# Patient Record
Sex: Female | Born: 1935
Health system: Southern US, Community
[De-identification: ages and names within clinical notes are randomized; demographics above are authoritative.]

## PROBLEM LIST (undated history)

## (undated) DIAGNOSIS — J439 Emphysema, unspecified: Secondary | ICD-10-CM

## (undated) DIAGNOSIS — M810 Age-related osteoporosis without current pathological fracture: Secondary | ICD-10-CM

## (undated) DIAGNOSIS — M109 Gout, unspecified: Secondary | ICD-10-CM

## (undated) DIAGNOSIS — E785 Hyperlipidemia, unspecified: Secondary | ICD-10-CM

## (undated) DIAGNOSIS — D510 Vitamin B12 deficiency anemia due to intrinsic factor deficiency: Secondary | ICD-10-CM

## (undated) DIAGNOSIS — F1010 Alcohol abuse, uncomplicated: Secondary | ICD-10-CM

## (undated) DIAGNOSIS — I10 Essential (primary) hypertension: Secondary | ICD-10-CM

## (undated) DIAGNOSIS — E559 Vitamin D deficiency, unspecified: Secondary | ICD-10-CM

## (undated) DIAGNOSIS — S92351A Displaced fracture of fifth metatarsal bone, right foot, initial encounter for closed fracture: Secondary | ICD-10-CM

## (undated) DIAGNOSIS — M353 Polymyalgia rheumatica: Secondary | ICD-10-CM

## (undated) HISTORY — DX: Vitamin B12 deficiency anemia due to intrinsic factor deficiency: D51.0

## (undated) HISTORY — DX: Alcohol abuse, uncomplicated: F10.10

## (undated) HISTORY — DX: Displaced fracture of fifth metatarsal bone, right foot, initial encounter for closed fracture: S92.351A

## (undated) HISTORY — DX: Polymyalgia rheumatica: M35.3

## (undated) HISTORY — PX: PARTIAL HIP ARTHROPLASTY: SHX733

## (undated) HISTORY — DX: Gout, unspecified: M10.9

## (undated) HISTORY — DX: Emphysema, unspecified: J43.9

## (undated) HISTORY — DX: Essential (primary) hypertension: I10

## (undated) HISTORY — DX: Vitamin D deficiency, unspecified: E55.9

## (undated) HISTORY — DX: Hyperlipidemia, unspecified: E78.5

## (undated) HISTORY — DX: Age-related osteoporosis without current pathological fracture: M81.0

---

## 1997-04-22 ENCOUNTER — Other Ambulatory Visit: Admission: RE | Admit: 1997-04-22 | Discharge: 1997-04-22 | Payer: Self-pay | Admitting: Rheumatology

## 1999-02-08 ENCOUNTER — Other Ambulatory Visit: Admission: RE | Admit: 1999-02-08 | Discharge: 1999-02-08 | Payer: Self-pay | Admitting: Family Medicine

## 2001-05-21 ENCOUNTER — Ambulatory Visit (HOSPITAL_COMMUNITY): Admission: RE | Admit: 2001-05-21 | Discharge: 2001-05-21 | Payer: Self-pay | Admitting: Rheumatology

## 2003-11-24 ENCOUNTER — Ambulatory Visit: Payer: Self-pay | Admitting: Family Medicine

## 2003-12-06 ENCOUNTER — Ambulatory Visit: Payer: Self-pay | Admitting: Internal Medicine

## 2003-12-24 ENCOUNTER — Ambulatory Visit: Payer: Self-pay | Admitting: Family Medicine

## 2004-01-25 ENCOUNTER — Ambulatory Visit: Payer: Self-pay | Admitting: Internal Medicine

## 2004-01-27 ENCOUNTER — Ambulatory Visit: Payer: Self-pay | Admitting: Family Medicine

## 2004-02-29 ENCOUNTER — Ambulatory Visit: Payer: Self-pay | Admitting: Family Medicine

## 2004-03-28 ENCOUNTER — Ambulatory Visit: Payer: Self-pay | Admitting: Family Medicine

## 2004-05-01 ENCOUNTER — Ambulatory Visit: Payer: Self-pay | Admitting: Internal Medicine

## 2004-05-31 ENCOUNTER — Ambulatory Visit: Payer: Self-pay | Admitting: Family Medicine

## 2004-06-28 ENCOUNTER — Ambulatory Visit: Payer: Self-pay | Admitting: Family Medicine

## 2004-07-28 ENCOUNTER — Ambulatory Visit: Payer: Self-pay | Admitting: Family Medicine

## 2004-08-28 ENCOUNTER — Ambulatory Visit: Payer: Self-pay | Admitting: Family Medicine

## 2004-09-26 ENCOUNTER — Ambulatory Visit: Payer: Self-pay | Admitting: Family Medicine

## 2004-10-24 ENCOUNTER — Ambulatory Visit: Payer: Self-pay | Admitting: Family Medicine

## 2004-11-22 ENCOUNTER — Ambulatory Visit: Payer: Self-pay | Admitting: Family Medicine

## 2004-12-26 ENCOUNTER — Ambulatory Visit: Payer: Self-pay | Admitting: Family Medicine

## 2005-01-25 ENCOUNTER — Ambulatory Visit: Payer: Self-pay | Admitting: Family Medicine

## 2005-02-26 ENCOUNTER — Ambulatory Visit: Payer: Self-pay | Admitting: Family Medicine

## 2005-03-27 ENCOUNTER — Ambulatory Visit: Payer: Self-pay | Admitting: Family Medicine

## 2005-05-01 ENCOUNTER — Ambulatory Visit: Payer: Self-pay | Admitting: Family Medicine

## 2005-05-31 ENCOUNTER — Ambulatory Visit: Payer: Self-pay | Admitting: Family Medicine

## 2005-07-03 ENCOUNTER — Ambulatory Visit: Payer: Self-pay | Admitting: Family Medicine

## 2005-07-22 ENCOUNTER — Inpatient Hospital Stay (HOSPITAL_COMMUNITY): Admission: EM | Admit: 2005-07-22 | Discharge: 2005-08-01 | Payer: Self-pay | Admitting: Emergency Medicine

## 2005-07-25 ENCOUNTER — Ambulatory Visit: Payer: Self-pay | Admitting: Physical Medicine & Rehabilitation

## 2005-08-30 ENCOUNTER — Ambulatory Visit: Payer: Self-pay | Admitting: Family Medicine

## 2005-10-01 ENCOUNTER — Encounter: Admission: RE | Admit: 2005-10-01 | Discharge: 2005-10-01 | Payer: Self-pay | Admitting: Orthopedic Surgery

## 2005-10-02 ENCOUNTER — Ambulatory Visit: Payer: Self-pay | Admitting: Family Medicine

## 2005-11-01 ENCOUNTER — Ambulatory Visit: Payer: Self-pay | Admitting: Family Medicine

## 2005-11-29 ENCOUNTER — Ambulatory Visit: Payer: Self-pay | Admitting: Family Medicine

## 2005-12-27 ENCOUNTER — Ambulatory Visit: Payer: Self-pay | Admitting: Family Medicine

## 2006-01-29 ENCOUNTER — Ambulatory Visit: Payer: Self-pay | Admitting: Family Medicine

## 2006-02-28 ENCOUNTER — Ambulatory Visit: Payer: Self-pay | Admitting: Family Medicine

## 2006-03-28 ENCOUNTER — Ambulatory Visit: Payer: Self-pay | Admitting: Family Medicine

## 2006-04-29 ENCOUNTER — Ambulatory Visit: Payer: Self-pay | Admitting: Family Medicine

## 2006-05-29 ENCOUNTER — Ambulatory Visit: Payer: Self-pay | Admitting: Family Medicine

## 2006-06-25 ENCOUNTER — Ambulatory Visit: Payer: Self-pay | Admitting: Family Medicine

## 2006-07-12 ENCOUNTER — Encounter: Payer: Self-pay | Admitting: Family Medicine

## 2006-07-12 DIAGNOSIS — M810 Age-related osteoporosis without current pathological fracture: Secondary | ICD-10-CM

## 2006-07-12 DIAGNOSIS — E559 Vitamin D deficiency, unspecified: Secondary | ICD-10-CM

## 2006-07-12 DIAGNOSIS — I10 Essential (primary) hypertension: Secondary | ICD-10-CM | POA: Insufficient documentation

## 2006-07-12 HISTORY — PX: APPENDECTOMY: SHX54

## 2006-07-12 HISTORY — DX: Vitamin D deficiency, unspecified: E55.9

## 2006-07-12 HISTORY — DX: Age-related osteoporosis without current pathological fracture: M81.0

## 2006-07-25 ENCOUNTER — Ambulatory Visit: Payer: Self-pay | Admitting: Family Medicine

## 2006-08-27 ENCOUNTER — Ambulatory Visit: Payer: Self-pay | Admitting: Family Medicine

## 2006-09-26 ENCOUNTER — Ambulatory Visit: Payer: Self-pay | Admitting: Family Medicine

## 2006-10-25 ENCOUNTER — Ambulatory Visit: Payer: Self-pay | Admitting: Family Medicine

## 2006-10-25 DIAGNOSIS — D510 Vitamin B12 deficiency anemia due to intrinsic factor deficiency: Secondary | ICD-10-CM

## 2006-10-25 HISTORY — DX: Vitamin B12 deficiency anemia due to intrinsic factor deficiency: D51.0

## 2006-11-28 ENCOUNTER — Ambulatory Visit: Payer: Self-pay | Admitting: Family Medicine

## 2006-12-18 ENCOUNTER — Ambulatory Visit: Payer: Self-pay | Admitting: Family Medicine

## 2006-12-26 ENCOUNTER — Ambulatory Visit: Payer: Self-pay | Admitting: Family Medicine

## 2007-01-28 ENCOUNTER — Ambulatory Visit: Payer: Self-pay | Admitting: Family Medicine

## 2007-02-20 ENCOUNTER — Telehealth: Payer: Self-pay | Admitting: Family Medicine

## 2007-02-27 ENCOUNTER — Ambulatory Visit: Payer: Self-pay | Admitting: Family Medicine

## 2007-03-27 ENCOUNTER — Ambulatory Visit: Payer: Self-pay | Admitting: Family Medicine

## 2007-03-27 DIAGNOSIS — M353 Polymyalgia rheumatica: Secondary | ICD-10-CM

## 2007-03-27 DIAGNOSIS — E785 Hyperlipidemia, unspecified: Secondary | ICD-10-CM

## 2007-03-27 DIAGNOSIS — E039 Hypothyroidism, unspecified: Secondary | ICD-10-CM | POA: Insufficient documentation

## 2007-03-27 HISTORY — DX: Polymyalgia rheumatica: M35.3

## 2007-03-27 HISTORY — DX: Hyperlipidemia, unspecified: E78.5

## 2007-03-31 LAB — CONVERTED CEMR LAB
BUN: 11 mg/dL (ref 6–23)
Basophils Absolute: 0.1 10*3/uL (ref 0.0–0.1)
Basophils Relative: 0.8 % (ref 0.0–1.0)
CO2: 31 meq/L (ref 19–32)
Calcium: 9.7 mg/dL (ref 8.4–10.5)
Chloride: 101 meq/L (ref 96–112)
Cholesterol: 205 mg/dL (ref 0–200)
Direct LDL: 89.5 mg/dL
Folate: 6.7 ng/mL
GFR calc Af Amer: 90 mL/min
GFR calc non Af Amer: 74 mL/min
Hemoglobin: 13.9 g/dL (ref 12.0–15.0)
Monocytes Absolute: 0.8 10*3/uL — ABNORMAL HIGH (ref 0.2–0.7)
Monocytes Relative: 10.4 % (ref 3.0–11.0)
Neutro Abs: 4.6 10*3/uL (ref 1.4–7.7)
Platelets: 224 10*3/uL (ref 150–400)
Sodium: 140 meq/L (ref 135–145)
TSH: 1.98 microintl units/mL (ref 0.35–5.50)
Total CHOL/HDL Ratio: 2.1
Triglycerides: 86 mg/dL (ref 0–149)
Vitamin B-12: >1500 pg/mL — ABNORMAL HIGH (ref 211–911)

## 2007-04-29 ENCOUNTER — Ambulatory Visit: Payer: Self-pay | Admitting: Family Medicine

## 2007-05-28 ENCOUNTER — Ambulatory Visit: Payer: Self-pay | Admitting: Family Medicine

## 2007-06-25 ENCOUNTER — Ambulatory Visit: Payer: Self-pay | Admitting: Family Medicine

## 2007-07-23 ENCOUNTER — Ambulatory Visit: Payer: Self-pay | Admitting: Family Medicine

## 2007-08-20 ENCOUNTER — Ambulatory Visit: Payer: Self-pay | Admitting: Family Medicine

## 2007-09-17 ENCOUNTER — Ambulatory Visit: Payer: Self-pay | Admitting: Family Medicine

## 2007-10-15 ENCOUNTER — Ambulatory Visit: Payer: Self-pay | Admitting: Family Medicine

## 2007-11-12 ENCOUNTER — Ambulatory Visit: Payer: Self-pay | Admitting: Family Medicine

## 2007-12-16 ENCOUNTER — Ambulatory Visit: Payer: Self-pay | Admitting: Family Medicine

## 2008-01-13 ENCOUNTER — Ambulatory Visit: Payer: Self-pay | Admitting: Family Medicine

## 2008-02-10 ENCOUNTER — Ambulatory Visit: Payer: Self-pay | Admitting: Family Medicine

## 2008-03-09 ENCOUNTER — Ambulatory Visit: Payer: Self-pay | Admitting: Family Medicine

## 2008-04-06 ENCOUNTER — Ambulatory Visit: Payer: Self-pay | Admitting: Family Medicine

## 2008-05-04 ENCOUNTER — Ambulatory Visit: Payer: Self-pay | Admitting: Family Medicine

## 2008-06-01 ENCOUNTER — Ambulatory Visit: Payer: Self-pay | Admitting: Family Medicine

## 2008-06-01 LAB — CONVERTED CEMR LAB
Bilirubin Urine: NEGATIVE
Blood in Urine, dipstick: NEGATIVE
Specific Gravity, Urine: 1.015
pH: 5

## 2008-06-11 LAB — CONVERTED CEMR LAB
Basophils Absolute: 0 10*3/uL (ref 0.0–0.1)
Basophils Relative: 0.2 % (ref 0.0–3.0)
CO2: 31 meq/L (ref 19–32)
Chloride: 107 meq/L (ref 96–112)
Creatinine, Ser: 0.7 mg/dL (ref 0.4–1.2)
Eosinophils Absolute: 0 10*3/uL (ref 0.0–0.7)
Eosinophils Relative: 0.4 % (ref 0.0–5.0)
Folate: 8.5 ng/mL
GFR calc non Af Amer: 85.98 mL/min (ref >60)
Glucose, Bld: 92 mg/dL (ref 70–99)
MCV: 110.4 fL — ABNORMAL HIGH (ref 78.0–100.0)
Monocytes Absolute: 0.4 10*3/uL (ref 0.1–1.0)
Monocytes Relative: 5.4 % (ref 3.0–12.0)
Neutrophils Relative %: 79.9 % — ABNORMAL HIGH (ref 43.0–77.0)
Phosphorus: 3.1 mg/dL (ref 2.3–4.6)
Platelets: 191 10*3/uL (ref 150.0–400.0)
Sodium: 145 meq/L (ref 135–145)
Triglycerides: 61 mg/dL (ref 0.0–149.0)
Vit D, 25-Hydroxy: 38 ng/mL (ref 30–89)
Vitamin B-12: >1500 pg/mL — ABNORMAL HIGH (ref 211–911)

## 2008-06-29 ENCOUNTER — Ambulatory Visit: Payer: Self-pay | Admitting: Family Medicine

## 2008-07-27 ENCOUNTER — Ambulatory Visit: Payer: Self-pay | Admitting: Family Medicine

## 2008-08-24 ENCOUNTER — Ambulatory Visit: Payer: Self-pay | Admitting: Family Medicine

## 2008-09-21 ENCOUNTER — Ambulatory Visit: Payer: Self-pay | Admitting: Family Medicine

## 2008-10-19 ENCOUNTER — Ambulatory Visit: Payer: Self-pay | Admitting: Family Medicine

## 2008-11-16 ENCOUNTER — Ambulatory Visit: Payer: Self-pay | Admitting: Family Medicine

## 2008-11-26 ENCOUNTER — Encounter (INDEPENDENT_AMBULATORY_CARE_PROVIDER_SITE_OTHER): Payer: Self-pay | Admitting: *Deleted

## 2008-12-14 ENCOUNTER — Ambulatory Visit: Payer: Self-pay | Admitting: Family Medicine

## 2008-12-20 ENCOUNTER — Encounter: Payer: Self-pay | Admitting: Family Medicine

## 2009-01-11 ENCOUNTER — Ambulatory Visit: Payer: Self-pay | Admitting: Family Medicine

## 2009-01-26 ENCOUNTER — Ambulatory Visit: Payer: Self-pay | Admitting: Family Medicine

## 2009-01-26 DIAGNOSIS — J439 Emphysema, unspecified: Secondary | ICD-10-CM

## 2009-01-26 HISTORY — DX: Emphysema, unspecified: J43.9

## 2009-02-08 ENCOUNTER — Ambulatory Visit: Payer: Self-pay | Admitting: Family Medicine

## 2009-03-08 ENCOUNTER — Ambulatory Visit: Payer: Self-pay | Admitting: Family Medicine

## 2009-04-05 ENCOUNTER — Ambulatory Visit: Payer: Self-pay | Admitting: Family Medicine

## 2009-05-03 ENCOUNTER — Ambulatory Visit: Payer: Self-pay | Admitting: Family Medicine

## 2009-05-31 ENCOUNTER — Ambulatory Visit: Payer: Self-pay | Admitting: Family Medicine

## 2009-06-30 ENCOUNTER — Ambulatory Visit: Payer: Self-pay | Admitting: Family Medicine

## 2009-06-30 DIAGNOSIS — E559 Vitamin D deficiency, unspecified: Secondary | ICD-10-CM | POA: Insufficient documentation

## 2009-07-11 LAB — CONVERTED CEMR LAB
ALT: 15 units/L (ref 0–35)
AST: 27 units/L (ref 0–37)
Albumin: 4.2 g/dL (ref 3.5–5.2)
Basophils Absolute: 0 10*3/uL (ref 0.0–0.1)
Basophils Relative: 0.2 % (ref 0.0–3.0)
Calcium: 9.3 mg/dL (ref 8.4–10.5)
Chloride: 101 meq/L (ref 96–112)
Creatinine, Ser: 0.8 mg/dL (ref 0.4–1.2)
Direct LDL: 87.8 mg/dL
Eosinophils Relative: 1 % (ref 0.0–5.0)
GFR calc non Af Amer: 71.43 mL/min (ref >60)
Glucose, Bld: 86 mg/dL (ref 70–99)
HCT: 41.2 % (ref 36.0–46.0)
HDL: 135.5 mg/dL (ref >39.00)
Hemoglobin: 14.2 g/dL (ref 12.0–15.0)
Lymphocytes Relative: 22.9 % (ref 12.0–46.0)
Lymphs Abs: 1.9 10*3/uL (ref 0.7–4.0)
MCHC: 34.5 g/dL (ref 30.0–36.0)
Monocytes Absolute: 0.8 10*3/uL (ref 0.1–1.0)
Monocytes Relative: 9.3 % (ref 3.0–12.0)
Neutrophils Relative %: 66.6 % (ref 43.0–77.0)
Platelets: 198 10*3/uL (ref 150.0–400.0)
RDW: 14.2 % (ref 11.5–14.6)
TSH: 1.11 microintl units/mL (ref 0.35–5.50)
Total CHOL/HDL Ratio: 2
Total Protein: 7 g/dL (ref 6.0–8.3)
VLDL: 17 mg/dL (ref 0.0–40.0)

## 2009-07-26 ENCOUNTER — Encounter: Payer: Self-pay | Admitting: Family Medicine

## 2009-07-27 ENCOUNTER — Ambulatory Visit: Payer: Self-pay | Admitting: Family Medicine

## 2009-08-30 ENCOUNTER — Ambulatory Visit: Payer: Self-pay | Admitting: Family Medicine

## 2009-09-27 ENCOUNTER — Ambulatory Visit: Payer: Self-pay | Admitting: Family Medicine

## 2009-10-25 ENCOUNTER — Ambulatory Visit: Payer: Self-pay | Admitting: Family Medicine

## 2009-11-14 ENCOUNTER — Ambulatory Visit: Payer: Self-pay | Admitting: Family Medicine

## 2009-11-14 DIAGNOSIS — K047 Periapical abscess without sinus: Secondary | ICD-10-CM

## 2009-11-22 ENCOUNTER — Ambulatory Visit: Payer: Self-pay | Admitting: Family Medicine

## 2009-11-22 ENCOUNTER — Telehealth: Payer: Self-pay | Admitting: Family Medicine

## 2009-11-25 ENCOUNTER — Encounter: Payer: Self-pay | Admitting: Family Medicine

## 2009-11-29 ENCOUNTER — Telehealth: Payer: Self-pay | Admitting: Family Medicine

## 2009-12-20 ENCOUNTER — Ambulatory Visit: Payer: Self-pay | Admitting: Internal Medicine

## 2009-12-29 ENCOUNTER — Telehealth: Payer: Self-pay | Admitting: Family Medicine

## 2010-01-17 ENCOUNTER — Ambulatory Visit: Payer: Self-pay | Admitting: Family Medicine

## 2010-02-08 ENCOUNTER — Encounter: Payer: Self-pay | Admitting: Family Medicine

## 2010-02-14 ENCOUNTER — Ambulatory Visit
Admission: RE | Admit: 2010-02-14 | Discharge: 2010-02-14 | Payer: Self-pay | Source: Home / Self Care | Attending: Family Medicine | Admitting: Family Medicine

## 2010-02-21 NOTE — Assessment & Plan Note (Signed)
Summary: b12/ccm   Nurse Visit   Allergies: 1)  ! Asa 2)  ! Naprosyn 3)  ! Codeine 4)  ! Percocet  Medication Administration  Injection # 1:    Medication: Vit B12 1000 mcg    Diagnosis: PERNICIOUS ANEMIA (ICD-281.0)    Route: IM    Site: R deltoid    Exp Date: 09/2010    Lot #: [id number]    Mfr: American Regent    Comments: bp 140/72 pt to sch cpx appt --due     Patient tolerated injection without complications    Given by: Pura Spice, RN (May 31, 2009 10:01 AM)  Orders Added: 1)  Vit B12 1000 mcg [J3420] 2)  Admin of Therapeutic Inj  intramuscular or subcutaneous [95284]

## 2010-02-21 NOTE — Assessment & Plan Note (Signed)
Summary: b12---[facility]//ccm   Nurse Visit   Allergies: 1)  ! Asa 2)  ! Naprosyn 3)  ! Codeine 4)  ! Percocet  Medication Administration  Injection # 1:    Medication: Vit B12 1000 mcg    Diagnosis: PERNICIOUS ANEMIA (ICD-281.0)    Route: IM    Site: R deltoid    Exp Date: 4/13    Lot #: [id number]    Mfr: American Regent    Patient tolerated injection without complications    Given by: Josph Macho RMA (October 25, 2009 11:12 AM)  Orders Added: 1)  Flu Vaccine 67yrs + MEDICARE PATIENTS [Q2039] 2)  Administration Flu vaccine - MCR [G0008] 3)  Vit B12 1000 mcg [J3420] 4)  Admin of Therapeutic Inj  intramuscular or subcutaneous [96372]    Review of Systems       Flu Vaccine Consent Questions     Do you have a history of severe allergic reactions to this vaccine? no    Any prior history of allergic reactions to egg and/or gelatin? no    Do you have a sensitivity to the preservative Thimersol? no    Do you have a past history of Guillan-Barre Syndrome? no    Do you currently have an acute febrile illness? no    Have you ever had a severe reaction to latex? no    Vaccine information given and explained to patient? yes    Are you currently pregnant? no    Lot Number:[id number]   Exp Date:07/22/2010   Site Given  Left Deltoid IM Josph Macho RMA  October 25, 2009 11:11 AM

## 2010-02-21 NOTE — Assessment & Plan Note (Signed)
Summary: b12//ccm/rov/mm   Vital Signs:  Patient profile:   77 year old female Height:      61 inches (154.94 cm) Weight:      102.31 pounds (46.50 kg) O2 Sat:      96 % on Room air Temp:     98.0 degrees F (36.67 degrees C) oral Pulse rate:   63 / minute BP sitting:   158 / 70  (left arm) Cuff size:   regular  Vitals Entered By: Josph Macho RMA (November 22, 2009 11:17 AM)  O2 Flow:  Room air  Serial Vital Signs/Assessments:  Time      Position  BP       Pulse  Resp  Temp     By                     148/78                         Danise Edge MD  CC: B12/ follow up/ Cf Is Patient Diabetic? No   History of Present Illness: patient is a 77 year old Caucasian female in today for reevaluation of dental infection. she reports feeling much better at present after 2-3 days on the clindamycin her pain resolved swelling went down and she is tolerating the medicine. She denies any fevers, chills, headache, URI symptoms. She did try just the tramadol for the pain but this caused itching as her other medicines have been the past which he stopped it and has only used Tylenol p.r.n. she has tolerated the increase in metoprolol without any fatigue, syncope or constipation.  Current Medications (verified): 1)  Vitamin D 16109 Unit  Caps (Ergocalciferol) 2)  Metoprolol Tartrate 25 Mg  Tabs (Metoprolol Tartrate) .... Take 1 Tablet Two Times A Day 3)  Fosamax 70 Mg Tabs (Alendronate Sodium) .Marland Kitchen.. 1 Weekly 4)  Prednisone .... Dr Kellie Simmering 5)  Clindamycin Hcl 300 Mg Caps (Clindamycin Hcl) .Marland Kitchen.. 1 Cap By Mouth 4 X Daily X 10 Days 6)  Omega 3 .... 2 Once A Day  Allergies (verified): 1)  ! Asa 2)  ! Naprosyn 3)  ! Codeine 4)  ! Percocet  Past History:  Past medical history reviewed for relevance to current acute and chronic problems. Social history (including risk factors) reviewed for relevance to current acute and chronic problems.  Past Medical History: Reviewed history from 01/26/2009  and no changes required. Anemia-iron deficiency Hypertension Osteoporosis vitamin B12 deficiency  Social History: Reviewed history and no changes required.  Review of Systems      See HPI  Physical Exam  General:  Well-developed,well-nourished,in no acute distress; alert,appropriate and cooperative throughout examination Mouth:  Oral mucosa and oropharynx without lesions or exudates.  Neck:  No deformities, masses, or tenderness noted. Lungs:  Normal respiratory effort, chest expands symmetrically. Lungs are clear to auscultation, no crackles or wheezes. Heart:  Normal rate and regular rhythm. S1 and S2 normal without gallop, murmur, click, rub or other extra sounds. Abdomen:  Bowel sounds positive,abdomen soft and non-tender without masses, organomegaly or hernias noted. Extremities:  No clubbing, cyanosis, edema, or deformity noted with normal full range of motion of all joints.     Impression & Recommendations:  Problem # 1:  ABSCESS, TOOTH (ICD-522.5) Finish course of Clindamycin and eat a yogurt daily  Problem # 2:  PERNICIOUS ANEMIA (ICD-281.0)  Orders: Admin of Therapeutic Inj  intramuscular or subcutaneous (60454) Vit B12 1000 mcg (  Z6109) Return in 1 month for next shot  Problem # 3:  HYPERTENSION (ICD-401.9)  Her updated medication list for this problem includes:    Metoprolol Tartrate 25 Mg Tabs (Metoprolol tartrate) .Marland Kitchen... Take 1 tablet two times a day Improved with repeat measurement, will not change meds today  Complete Medication List: 1)  Vitamin D 60454 Unit Caps (Ergocalciferol) 2)  Metoprolol Tartrate 25 Mg Tabs (Metoprolol tartrate) .... Take 1 tablet two times a day 3)  Fosamax 70 Mg Tabs (Alendronate sodium) .Marland KitchenMarland KitchenMarland Kitchen 1 weekly 4)  Prednisone  .... Dr Kellie Simmering 5)  Clindamycin Hcl 300 Mg Caps (Clindamycin hcl) .Marland Kitchen.. 1 cap by mouth 4 x daily x 10 days 6)  Omega 3  .... 2 once a day  Patient Instructions: 77)  Take 650 - 1000 mg of tylenol every 4-6 hours as  needed for relief of pain or comfort of fever. Avoid taking more than 4000 mg in a 24 hour period( can cause liver damage in higher doses).  2)  Take your antibiotic as prescribed until ALL of it is gone, but stop if you develop a rash or swelling and contact our office as soon as possible.  3)  Please schedule a follow-up appointment in 2 to 3 months.  4)  Please schedule a follow-up appointment in 1 month for shot only   Medication Administration  Injection # 1:    Medication: Vit B12 1000 mcg    Diagnosis: PERNICIOUS ANEMIA (ICD-281.0)    Route: IM    Site: L deltoid    Exp Date: 7/13    Lot #: [id number]    Mfr: American Regent    Patient tolerated injection without complications    Given by: Josph Macho RMA (November 22, 2009 11:22 AM)  Orders Added: 1)  Admin of Therapeutic Inj  intramuscular or subcutaneous [96372] 2)  Vit B12 1000 mcg [J3420] 3)  Est. Patient Level IV [09811]

## 2010-02-21 NOTE — Progress Notes (Signed)
  Prescriptions: METOPROLOL TARTRATE 25 MG  TABS (METOPROLOL TARTRATE) TAKE 1 TABLET two times a day  #180 x 0   Entered by:   Josph Macho RMA   Authorized by:   Danise Edge MD   Signed by:   Josph Macho RMA on 11/22/2009   Method used:   Printed then faxed to ...       [facility] (mail-order)             , [place]         Ph: [phone]       Fax: [phone]   RxID:   [id number]

## 2010-02-21 NOTE — Assessment & Plan Note (Signed)
Summary: EMP/PT FASTING/B-12 INJ/CJR left delt    Vital Signs:  Patient profile:   77 year old female Height:      61 inches Weight:      101 pounds BMI:     19.15 O2 Sat:      92 % Temp:     98.2 degrees F Pulse rate:   88 / minute Pulse rhythm:   regular BP sitting:   130 / 84  (left arm) Cuff size:   regular  Vitals Entered By: Pura Spice, RN (June 30, 2009 10:33 AM)  Contraindications/Deferment of Procedures/Staging:    Test/Procedure: PAP Smear    Reason for deferment: patient declined     Test/Procedure: Colonoscopy    Reason for deferment: patient declined  CC: go over  problems refuses pap today fasting for labs    History of Present Illness: This 77 year old white married female is in to discuss her medical problems, to refill her medications. Patient refuses Pap smear, mammogram due in November as well as bone density patient defers colonoscopic exam Polymyalgia rheumatica is controlled with prednisone 2.5 mg q.d. Hypertension is controlled She would like to have some analgesic stronger than tramadol continues to get vitamin B12 monthly for pernicious anemia  Allergies: 1)  ! Asa 2)  ! Naprosyn 3)  ! Codeine 4)  ! Percocet  Past History:  Past Medical History: Last updated: 01/26/2009 Anemia-iron deficiency Hypertension Osteoporosis vitamin B12 deficiency  Past Surgical History: Last updated: 07/12/2006 Appendectomy  Risk Factors: Smoking Status: quit (07/12/2006)  Review of Systems      See HPI  The patient denies anorexia, fever, weight loss, weight gain, vision loss, decreased hearing, hoarseness, chest pain, syncope, dyspnea on exertion, peripheral edema, prolonged cough, headaches, hemoptysis, abdominal pain, melena, hematochezia, severe indigestion/heartburn, hematuria, incontinence, genital sores, muscle weakness, suspicious skin lesions, transient blindness, difficulty walking, depression, unusual weight change, abnormal bleeding,  enlarged lymph nodes, angioedema, breast masses, and testicular masses.    Physical Exam  General:  alert, well-nourished, and well-hydrated.  but tanned small framed lady Head:  Normocephalic and atraumatic without obvious abnormalities. No apparent alopecia or balding. Eyes:  No corneal or conjunctival inflammation noted. EOMI. Perrla. Funduscopic exam benign, without hemorrhages, exudates or papilledema. Vision grossly normal. Ears:  External ear exam shows no significant lesions or deformities.  Otoscopic examination reveals clear canals, tympanic membranes are intact bilaterally without bulging, retraction, inflammation or discharge. Hearing is grossly normal bilaterally. Nose:  External nasal examination shows no deformity or inflammation. Nasal mucosa are pink and moist without lesions or exudates. Mouth:  Oral mucosa and oropharynx without lesions or exudates.  Teeth in good repair. Neck:  No deformities, masses, or tenderness noted. Chest Wall:  No deformities, masses, or tenderness noted. Breasts:  No mass, nodules, thickening, tenderness, bulging, retraction, inflamation, nipple discharge or skin changes noted.   Lungs:  Normal respiratory effort, chest expands symmetrically. Lungs are clear to auscultation, no crackles or wheezes. Heart:  Normal rate and regular rhythm. S1 and S2 normal without gallop, murmur, click, rub or other extra sounds. Abdomen:  Bowel sounds positive,abdomen soft and non-tender without masses, organomegaly or hernias noted. Rectal:  not examined Genitalia:  not examined Msk:  arthritic deformities of fingers otherwise are more cold Pulses:  R and L carotid,radial,femoral,dorsalis pedis and posterior tibial pulses are full and equal bilaterally Extremities:  No clubbing, cyanosis, edema, or deformity noted with normal full range of motion of all joints.   Neurologic:  No cranial nerve deficits noted. Station and gait are normal. Plantar reflexes are down-going  bilaterally. DTRs are symmetrical throughout. Sensory, motor and coordinative functions appear intact.   Impression & Recommendations:  Problem # 1:  VITAMIN D DEFICIENCY (ICD-268.9) Assessment New  Orders: T-Vitamin D (25-Hydroxy) [phone]) Prescription Created Electronically [phone])  Problem # 2:  EMPHYSEMA (ICD-492.8) Assessment: Unchanged  Problem # 3:  POLYMYALGIA RHEUMATICA (ICD-725) Assessment: Improved prednisone 2.5 mg q.d.  Problem # 4:  PERNICIOUS ANEMIA (ICD-281.0) Assessment: Improved  Orders: Vit B12 1000 mcg (J3420) Admin of Therapeutic Inj  intramuscular or subcutaneous (43329) TLB-BMP (Basic Metabolic Panel-BMET) (80048-METABOL) TLB-CBC Platelet - w/Differential (85025-CBCD) TLB-B12, Serum-Total ONLY (51884-Z66)  Problem # 5:  OSTEOPOROSIS (ICD-733.00) Assessment: Improved  Her updated medication list for this problem includes:    Vitamin D 06301 Unit Caps (Ergocalciferol)    Fosamax 70 Mg Tabs (Alendronate sodium) .Marland Kitchen... 1 weekly  Problem # 6:  HYPERTENSION (ICD-401.9) Assessment: Improved  Her updated medication list for this problem includes:    Metoprolol Tartrate 25 Mg Tabs (Metoprolol tartrate) .Marland Kitchen... Take 1/2 tablet two times a day  Complete Medication List: 1)  Vitamin D 60109 Unit Caps (Ergocalciferol) 2)  Metoprolol Tartrate 25 Mg Tabs (Metoprolol tartrate) .... Take 1/2 tablet two times a day 3)  Fosamax 70 Mg Tabs (Alendronate sodium) .Marland KitchenMarland KitchenMarland Kitchen 1 weekly 4)  Prednisone  .... Dr Kellie Simmering 5)  Hydrocodone-acetaminophen 5-500 Mg Tabs (Hydrocodone-acetaminophen) .Marland Kitchen.. 1-2 tab q4h as needed  pain,  Other Orders: Venipuncture (32355) TLB-Lipid Panel (80061-LIPID) TLB-Hepatic/Liver Function Pnl (80076-HEPATIC) TLB-TSH (Thyroid Stimulating Hormone) (84443-TSH)  Patient Instructions: 1)  physical examination reveals you're doing her while 2)  Continue previous medications 3)  Will prescribe hydrocodone for her pain 4)  Continued prednisone 2.5  mg q. day 5)  We'll call lab result Prescriptions: METOPROLOL TARTRATE 25 MG  TABS (METOPROLOL TARTRATE) TAKE 1/2 TABLET two times a day  #30 x 11   Entered and Authorized by:   Judithann Sheen MD   Signed by:   Judithann Sheen MD on 06/30/2009   Method used:   Electronically to        [id number] (retail)       [address] [place], [place]  [address]       Ph: [phone]       Fax: [phone]   RxID:   [id number] HYDROCODONE-ACETAMINOPHEN 5-500 MG TABS (HYDROCODONE-ACETAMINOPHEN) 1-2 tab q4h as needed  pain,  #100 x 5   Entered and Authorized by:   Judithann Sheen MD   Signed by:   Judithann Sheen MD on 06/30/2009   Method used:   Print then Give to Patient   RxID:   [id number]    Medication Administration  Injection # 1:    Medication: Vit B12 1000 mcg    Diagnosis: PERNICIOUS ANEMIA (ICD-281.0)    Route: IM    Site: L deltoid    Exp Date: 09/2010    Lot #: [id number]    Mfr: American Regent    Patient tolerated injection without complications    Given by: Pura Spice, RN (June 30, 2009 11:40 AM)  Orders Added: 1)  Venipuncture [67893] 2)  T-Vitamin D (25-Hydroxy) [id number] 3)  Vit B12 1000 mcg [J3420] 4)  Admin of Therapeutic Inj  intramuscular or subcutaneous [96372] 5)  TLB-Lipid Panel [80061-LIPID] 6)  TLB-BMP (Basic Metabolic Panel-BMET) [80048-METABOL] 7)  TLB-Hepatic/Liver Function Pnl [80076-HEPATIC] 8)  TLB-TSH (Thyroid Stimulating Hormone) [84443-TSH] 9)  TLB-CBC Platelet - w/Differential [85025-CBCD] 10)  TLB-B12, Serum-Total ONLY [82607-B12] 11)  Prescription Created Electronically [G8553] 12)  Est. Patient Level IV [16109]

## 2010-02-21 NOTE — Assessment & Plan Note (Signed)
Summary: B12 INJ//SLM   Nurse Visit   Allergies: 1)  ! Asa 2)  ! Naprosyn 3)  ! Codeine 4)  ! Percocet  Medication Administration  Injection # 1:    Medication: Vit B12 1000 mcg    Diagnosis: PERNICIOUS ANEMIA (ICD-281.0)    Route: IM    Site: R deltoid    Exp Date: 09/2010    Lot #: [id number]    Mfr: American Regent    Patient tolerated injection without complications    Given by: Pura Spice, RN (August 30, 2009 10:30 AM)  Orders Added: 1)  Vit B12 1000 mcg [J3420] 2)  Admin of Therapeutic Inj  intramuscular or subcutaneous [51884]

## 2010-02-21 NOTE — Assessment & Plan Note (Signed)
Summary: b12 with gina//ccm  rt deltoid    Nurse Visit   Allergies: 1)  ! Asa 2)  ! Naprosyn 3)  ! Codeine 4)  ! Percocet  Medication Administration  Injection # 1:    Medication: Vit B12 1000 mcg    Diagnosis: ANEMIA-IRON DEFICIENCY (ICD-280.9)    Route: IM    Site: R deltoid    Exp Date: 09/2010    Lot #: [id number]    Mfr: American Regent    Patient tolerated injection without complications    Given by: Pura Spice, RN (April 05, 2009 10:12 AM)  Orders Added: 1)  Vit B12 1000 mcg [J3420] 2)  Admin of Therapeutic Inj  intramuscular or subcutaneous [29528]

## 2010-02-21 NOTE — Assessment & Plan Note (Signed)
Summary: b12 inj//ccm  left delt    Nurse Visit   Allergies: 1)  ! Asa 2)  ! Naprosyn 3)  ! Codeine 4)  ! Percocet  Medication Administration  Injection # 1:    Medication: Vit B12 1000 mcg    Diagnosis: PERNICIOUS ANEMIA (ICD-281.0)    Route: IM    Site: L deltoid    Exp Date: 02/2011    Lot #: [id number]    Mfr: Abbott    Patient tolerated injection without complications    Given by: Pura Spice, RN (July 27, 2009 10:50 AM)  Orders Added: 1)  Vit B12 1000 mcg [J3420] 2)  Admin of Therapeutic Inj  intramuscular or subcutaneous [16109]

## 2010-02-21 NOTE — Assessment & Plan Note (Signed)
Summary: b12 inj/acm   Nurse Visit   Allergies: 1)  ! Asa 2)  ! Naprosyn 3)  ! Codeine 4)  ! Percocet  Medication Administration  Injection # 1:    Medication: Depo- Medrol 80mg     Diagnosis: VITAMIN D DEFICIENCY (ICD-268.9)    Route: IM    Site: L deltoid    Exp Date: 07/2011    Lot #: [id number]    Mfr: American Regent    Patient tolerated injection without complications    Given by: Duard Brady LPN (December 20, 2009 11:16 AM)  Orders Added: 1)  Depo- Medrol 80mg  [J1040] 2)  Admin of Therapeutic Inj  intramuscular or subcutaneous [14782]

## 2010-02-21 NOTE — Assessment & Plan Note (Signed)
Summary: b12 inj/njr   Nurse Visit   Allergies: 1)  ! Asa 2)  ! Naprosyn 3)  ! Codeine 4)  ! Percocet  Medication Administration  Injection # 1:    Medication: Vit B12 1000 mcg    Diagnosis: PERNICIOUS ANEMIA (ICD-281.0)    Route: IM    Site: L deltoid    Exp Date: 6/13    Lot #: [id number]    Mfr: American Regent    Patient tolerated injection without complications    Given by: Josph Macho RMA (September 27, 2009 11:03 AM)  Orders Added: 1)  Vit B12 1000 mcg [J3420] 2)  Admin of Therapeutic Inj  intramuscular or subcutaneous [16109]

## 2010-02-21 NOTE — Letter (Signed)
Summary: Stacey Drain MD  Stacey Drain MD   Imported By: Sherian Rein 12/06/2009 11:09:00  _____________________________________________________________________  External Attachment:    Type:   Image     Comment:   External Document

## 2010-02-21 NOTE — Assessment & Plan Note (Signed)
Summary: b12 inj/njr rt deltoid    Nurse Visit   Allergies: 1)  ! Asa 2)  ! Naprosyn 3)  ! Codeine 4)  ! Percocet  Medication Administration  Injection # 1:    Medication: Vit B12 1000 mcg    Diagnosis: PERNICIOUS ANEMIA (ICD-281.0)    Route: IM    Site: R deltoid    Exp Date: 04/2010    Lot #: [id number]    Mfr: American Regent    Patient tolerated injection without complications    Given by: Pura Spice, RN (February 08, 2009 10:11 AM)  Orders Added: 1)  Vit B12 1000 mcg [J3420] 2)  Admin of Therapeutic Inj  intramuscular or subcutaneous [96045]

## 2010-02-21 NOTE — Assessment & Plan Note (Signed)
Summary: root pain-not dental related//ccm   Vital Signs:  Patient profile:   77 year old female Height:      61 inches (154.94 cm) Weight:      103 pounds (46.82 kg) O2 Sat:      93 % on Room air Temp:     98.1 degrees F (36.72 degrees C) oral Pulse rate:   77 / minute BP sitting:   192 / 100  (left arm) Cuff size:   regular  Vitals Entered By: Josph Macho RMA (November 14, 2009 10:54 AM)  O2 Flow:  Room air CC: Root pain (pain in wisdom tooth on right side) X2 weeks/ CF Is Patient Diabetic? No   History of Present Illness: patient is a 77 year old Caucasian female in today with a recurrent dental infection. She has been seen multiple times by her dentist and an oral maxillofacial surgeon for similar symptoms. Each time she is told she has wisdom tooth which is infected but is wrapped around a nerve so they cannot excise it. sodas but hurting her now for about 2 weeks. As always on the right side in the lower jaw line. She denies fevers chills but does have some headache, postnasal drip, right ear pressure. She has been using saltwater gargles with minimal improvement. Denies chest pain, palpitations, shortness of breath, GI or GU complaints     Current Medications (verified): 1)  Vitamin D 74259 Unit  Caps (Ergocalciferol) 2)  Metoprolol Tartrate 25 Mg  Tabs (Metoprolol Tartrate) .... Take 1/2 Tablet Two Times A Day 3)  Fosamax 70 Mg Tabs (Alendronate Sodium) .Marland Kitchen.. 1 Weekly 4)  Prednisone .... Dr Kellie Simmering 5)  Hydrocodone-Acetaminophen 5-500 Mg Tabs (Hydrocodone-Acetaminophen) .Marland Kitchen.. 1-2 Tab Q4h As Needed  Pain,  Allergies (verified): 1)  ! Asa 2)  ! Naprosyn 3)  ! Codeine 4)  ! Percocet  Past History:  Past medical history reviewed for relevance to current acute and chronic problems. Social history (including risk factors) reviewed for relevance to current acute and chronic problems.  Past Medical History: Reviewed history from 01/26/2009 and no changes  required. Anemia-iron deficiency Hypertension Osteoporosis vitamin B12 deficiency  Social History: Reviewed history and no changes required.  Review of Systems      See HPI  Physical Exam  General:  Well-developed,well-nourished,in no acute distress; alert,appropriate and cooperative throughout examination Head:  Normocephalic and atraumatic without obvious abnormalities. No apparent alopecia or balding. Ears:  L TM dull, retracted opaque white patches noted. Nose:  External nasal examination shows no deformity or inflammation. Nasal mucosa are pink and moist without lesions or exudates. Mouth:  Unable to fully open mouth due to pain, no obvious lesions in mouth. No erythema or lesions. Pain with palpation noted over right lower jaw line and tender right sided submandibular and cervical LN Neck:  No deformities, masses, or tenderness noted. Lungs:  Normal respiratory effort, chest expands symmetrically. Lungs are clear to auscultation, no crackles or wheezes. Heart:  Normal rate and regular rhythm. S1 and S2 normal without gallop, click, rub or other extra sounds.grade1/6 systolic murmur.   Abdomen:  Bowel sounds positive,abdomen soft and non-tender without masses, organomegaly or hernias noted. Extremities:  No clubbing, cyanosis, edema, or deformity noted  Psych:  Cognition and judgment appear intact. Alert and cooperative with normal attention span and concentration. No apparent delusions, illusions, hallucinations   Impression & Recommendations:  Problem # 1:  ABSCESS, TOOTH (ICD-522.5) Clindamycin 300mg  by mouth qid and Align probiotic daily, Tylenol or Tramadol as  needed pain. Report if symptoms do not resolve  Problem # 2:  HYPERTENSION (ICD-401.9)  Her updated medication list for this problem includes:    Metoprolol Tartrate 25 Mg Tabs (Metoprolol tartrate) .Marland Kitchen... Take 1 tablet two times a day Increased today due to elevated BP, reevaluate next week  Problem # 3:   PERNICIOUS ANEMIA (ICD-281.0) Due for B12 injection next week will recheck BP then  Complete Medication List: 1)  Vitamin D 10272 Unit Caps (Ergocalciferol) 2)  Metoprolol Tartrate 25 Mg Tabs (Metoprolol tartrate) .... Take 1 tablet two times a day 3)  Fosamax 70 Mg Tabs (Alendronate sodium) .Marland KitchenMarland KitchenMarland Kitchen 1 weekly 4)  Prednisone  .... Dr Kellie Simmering 5)  Hydrocodone-acetaminophen 5-500 Mg Tabs (Hydrocodone-acetaminophen) .Marland Kitchen.. 1-2 tab q4h as needed  pain, 6)  Clindamycin Hcl 300 Mg Caps (Clindamycin hcl) .Marland Kitchen.. 1 cap by mouth 4 x daily x 10 days 7)  Tramadol Hcl 50 Mg Tabs (Tramadol hcl) .Marland Kitchen.. 1 tab by mouth three times a day as needed pain  Patient Instructions: 1)  Take 650 - 1000 mg of tylenol every 4-6 hours as needed for relief of pain or comfort of fever. Avoid taking more than 3000 mg in a 24 hour period( can cause liver damage in higher doses).  2)  Take your antibiotic as prescribed until ALL of it is gone, but stop if you develop a rash or swelling and contact our office as soon as possible.  3)  Take a yogurt daily while on an antibiotic 4)  Has appt for shot on Monday needs to make that changed to a regualr visit. Prescriptions: METOPROLOL TARTRATE 25 MG  TABS (METOPROLOL TARTRATE) TAKE 1 TABLET two times a day  #60 x 1   Entered and Authorized by:   Danise Edge MD   Signed by:   Danise Edge MD on 11/14/2009   Method used:   Electronically to        [facility] # [phone]* (retail)       [address] [place], [place]  [address]       Ph: [phone]       Fax: [phone]   RxID:   [id number] TRAMADOL HCL 50 MG TABS (TRAMADOL HCL) 1 tab by mouth three times a day as needed pain  #40 x 0   Entered and Authorized by:   Danise Edge MD   Signed by:   Danise Edge MD on 11/14/2009   Method used:   Electronically to        [facility] # [phone]* (retail)       [address] [place], [place]  [address]       Ph: [phone]       Fax: [phone]   RxID:    [id number] CLINDAMYCIN HCL 300 MG CAPS (CLINDAMYCIN HCL) 1 cap by mouth 4 x daily x 10 days  #40 x 0   Entered and Authorized by:   Danise Edge MD   Signed by:   Danise Edge MD on 11/14/2009   Method used:   Electronically to        [facility] # [phone]* (retail)       [address] [place], [place]  [address]       Ph: [phone]       Fax: [phone]   RxID:   [id number]  Orders Added: 1)  Est. Patient Level IV [id number]

## 2010-02-21 NOTE — Miscellaneous (Signed)
Summary: Living Will and Power of [employer] Will and Power of Attorney   Imported By: Maryln Gottron 09/21/2009 14:53:51  _____________________________________________________________________  External Attachment:    Type:   Image     Comment:   External Document

## 2010-02-21 NOTE — Assessment & Plan Note (Signed)
Summary: ?BRONCHITIS AND SINUS INF/CJR   Vital Signs:  Patient profile:   77 year old female Weight:      101 pounds O2 Sat:      93 % Temp:     97.6 degrees F Pulse rate:   73 / minute BP sitting:   140 / 80  (left arm)  Vitals Entered By: Pura Spice, RN (January 26, 2009 11:34 AM) CC:  cough gray in color sinus drainage coughing alot has had some sob with this sore in chest from cooug   History of Present Illness: This 77 year old white female has had a two-week history of cough and congestion and general myelination is also some nasal congestion with facial pain over this. A time. Her symptoms have been increased in severity over the past week and she is seeking some help. Hypertension has been controlled Vitamin B12 deficiency is controlled with monthly injections of vitamin B12 Osteoporosis controlled with Fosamax and calcium with vitamin D  Allergies: 1)  ! Asa 2)  ! Naprosyn 3)  ! Codeine 4)  ! Percocet  Past History:  Past Surgical History: Last updated: 07/12/2006 Appendectomy  Risk Factors: Smoking Status: quit (07/12/2006)  Past Medical History: Anemia-iron deficiency Hypertension Osteoporosis vitamin B12 deficiency  Review of Systems  The patient denies anorexia, fever, weight loss, weight gain, vision loss, decreased hearing, hoarseness, chest pain, syncope, dyspnea on exertion, peripheral edema, prolonged cough, headaches, hemoptysis, abdominal pain, melena, hematochezia, severe indigestion/heartburn, hematuria, incontinence, genital sores, muscle weakness, suspicious skin lesions, transient blindness, difficulty walking, depression, unusual weight change, abnormal bleeding, enlarged lymph nodes, angioedema, breast masses, and testicular masses.    Physical Exam  General:  Well-developed,well-nourished,in no acute distress; alert,appropriate and cooperative throughout examination Head:  Normocephalic and atraumatic without obvious abnormalities. No  apparent alopecia or balding. Eyes:  No corneal or conjunctival inflammation noted. EOMI. Perrla. Funduscopic exam benign, without hemorrhages, exudates or papilledema. Vision grossly normal. Ears:  External ear exam shows no significant lesions or deformities.  Otoscopic examination reveals clear canals, tympanic membranes are intact bilaterally without bulging, retraction, inflammation or discharge. Hearing is grossly normal bilaterally. Nose:  nasal congestion with discolored drainage also has facial tenderness over the maxillary sinuses Mouth:  Oral mucosa and oropharynx without lesions or exudates.  Teeth in good repair. Neck:  No deformities, masses, or tenderness noted. Lungs:  decreased breath sounds bilaterally with occasional rales, no dullness, no wheeze Heart:  Normal rate and regular rhythm. S1 and S2 normal without gallop, murmur, click, rub or other extra sounds. Extremities:  No clubbing, cyanosis, edema, or deformity noted with normal full range of motion of all joints.     Impression & Recommendations:  Problem # 1:  ACUTE MAXILLARY SINUSITIS (ICD-461.0) Assessment New  Her updated medication list for this problem includes:    Doxycycline Hyclate 100 Mg Tabs (Doxycycline hyclate) .Marland Kitchen... 1 two times a day for infection  Problem # 2:  EMPHYSEMA (ICD-492.8) Assessment: Unchanged  Problem # 3:  ACUTE BRONCHITIS (ICD-466.0) Assessment: New  Her updated medication list for this problem includes:    Doxycycline Hyclate 100 Mg Tabs (Doxycycline hyclate) .Marland Kitchen... 1 two times a day for infection  Orders: Prescription Created Electronically [phone])  Problem # 4:  POLYMYALGIA RHEUMATICA (ICD-725) Assessment: Unchanged  Problem # 5:  PERNICIOUS ANEMIA (ICD-281.0) Assessment: Improved  Problem # 6:  HYPERTENSION (ICD-401.9) Assessment: Improved  Her updated medication list for this problem includes:    Metoprolol Tartrate 25 Mg Tabs (Metoprolol  tartrate) .Marland Kitchen... Take 1/2 tablet  two times a day  Complete Medication List: 1)  Tramadol Hcl 50 Mg Tabs (Tramadol hcl) .Marland Kitchen.. 1 -2 every 4-6 hrs as needed pain 2)  Vitamin D 62952 Unit Caps (Ergocalciferol) 3)  Metoprolol Tartrate 25 Mg Tabs (Metoprolol tartrate) .... Take 1/2 tablet two times a day 4)  Fosamax 70 Mg Tabs (Alendronate sodium) .Marland KitchenMarland KitchenMarland Kitchen 1 weekly 5)  Doxycycline Hyclate 100 Mg Tabs (Doxycycline hyclate) .Marland Kitchen.. 1 two times a day for infection  Patient Instructions: 1)  upper respiratory infection and bronchitis 2)  Doxycc100 mg in AM and  3)   for infection 4)  Mucinex Dmaximum strength in AM and 5)  1 mucinex DM max imunm strength at 4-5 oclock 6)  stay well hydrated, good fluid intake Prescriptions: DOXYCYCLINE HYCLATE 100 MG TABS (DOXYCYCLINE HYCLATE) 1 two times a day for infection  #20 x 0   Entered and Authorized by:   Judithann Sheen MD   Signed by:   Judithann Sheen MD on 01/26/2009   Method used:   Electronically to        [facility] # [id number] (retail)       [address] [place], [place]  [address]       Ph: [phone]       Fax: [phone]   RxID:   [id number]

## 2010-02-21 NOTE — Progress Notes (Signed)
Summary: [facility] did not received rx  Phone Note Refill Request   Refills Requested: Medication #1:  METOPROLOL TARTRATE 25 MG  TABS TAKE 1 TABLET two times a day please resubmit rx to [facility]  Initial call taken by: Heron Sabins,  November 29, 2009 12:45 PM    Prescriptions: METOPROLOL TARTRATE 25 MG  TABS (METOPROLOL TARTRATE) TAKE 1 TABLET two times a day  #180 x 0   Entered by:   Josph Macho RMA   Authorized by:   Danise Edge MD   Signed by:   Josph Macho RMA on 11/29/2009   Method used:   Faxed to ...       [facility] (mail-order)             , [place]         Ph: [phone]       Fax: [phone]   RxID:   [id number] METOPROLOL TARTRATE 25 MG  TABS (METOPROLOL TARTRATE) TAKE 1 TABLET two times a day  #180 x 0   Entered and Authorized by:   Josph Macho RMA   Signed by:   Josph Macho RMA on 11/29/2009   Method used:   Faxed to ...       [facility] (mail-order)             , [place]         Ph: [phone]       Fax: [phone]   RxID:   [id number]

## 2010-02-21 NOTE — Assessment & Plan Note (Signed)
Summary: b12 inj/njr  left delt    Nurse Visit   Allergies: 1)  ! Asa 2)  ! Naprosyn 3)  ! Codeine 4)  ! Percocet  Medication Administration  Injection # 1:    Medication: Vit B12 1000 mcg    Diagnosis: PERNICIOUS ANEMIA (ICD-281.0)    Route: IM    Site: L deltoid    Exp Date: 09/2010    Lot #: [id number]    Mfr: [employer] Regent    Patient tolerated injection without complications    Given by: Pura Spice, RN (March 08, 2009 10:06 AM)  Orders Added: 1)  Vit B12 1000 mcg [J3420] 2)  Admin of Therapeutic Inj  intramuscular or subcutaneous [[id number]]

## 2010-02-21 NOTE — Assessment & Plan Note (Signed)
Summary: b12 inj//ccm   Nurse Visit   Allergies: 1)  ! Asa 2)  ! Naprosyn 3)  ! Codeine 4)  ! Percocet  Medication Administration  Injection # 1:    Medication: Vit B12 1000 mcg    Diagnosis: PERNICIOUS ANEMIA (ICD-281.0)    Route: SQ    Site: L deltoid    Exp Date: 09/26/2010    Lot #: [id number]    Mfr: [employer] Regent    Patient tolerated injection without complications    Given by: Lynann Beaver CMA (May 03, 2009 9:59 AM)  Orders Added: 1)  Vit B12 1000 mcg [J3420]

## 2010-02-21 NOTE — Medication Information (Signed)
Summary: New Rx for Metoprolol Tartrate  New Rx for Metoprolol Tartrate   Imported By: Maryln Gottron 11/24/2009 13:04:13  _____________________________________________________________________  External Attachment:    Type:   Image     Comment:   External Document

## 2010-02-23 ENCOUNTER — Ambulatory Visit: Admit: 2010-02-23 | Payer: Self-pay | Admitting: Family Medicine

## 2010-02-23 ENCOUNTER — Ambulatory Visit: Payer: Self-pay | Admitting: Family Medicine

## 2010-02-23 NOTE — Assessment & Plan Note (Signed)
Summary: B12 INJ//SLM   Nurse Visit   Allergies: 1)  ! Asa 2)  ! Naprosyn 3)  ! Codeine 4)  ! Percocet  Medication Administration  Injection # 1:    Medication: Vit B12 1000 mcg    Diagnosis: PERNICIOUS ANEMIA (ICD-281.0)    Route: IM    Site: R deltoid    Exp Date: 08/2011    Lot #: [id number]    Mfr: American Regent    Patient tolerated injection without complications    Given by: Kyung Rudd, CMA (February 14, 2010 10:43 AM)  Orders Added: 1)  Vit B12 1000 mcg [J3420] 2)  Admin of Therapeutic Inj  intramuscular or subcutaneous [[id number]]

## 2010-02-23 NOTE — Progress Notes (Signed)
Summary: requesting another abx  Phone Note Call from Patient Call back at Home Phone [phone]   Caller: Patient---triage vm Summary of Call: saw Dr Abner Greenspan in the past and was rx'd an abx and her sxs are back. wants  another abx sent to [facility] on [address]. Refuses ov. She has a broken bone in her foot and uses a walker, which she wears a special shoe which is awkward. Initial call taken by: Warnell Forester,  December 29, 2009 9:47 AM  Follow-up for Phone Call        per Dr Scotty Court already taken care of Follow-up by: Alfred Levins, CMA,  January 18, 2010 12:09 PM

## 2010-02-23 NOTE — Assessment & Plan Note (Signed)
Summary: ROA/AS PER DR/RCD/B12 INJ//SLM   Vital Signs:  Patient profile:   77 year old female Pulse (ortho):   80 / minute Pulse rhythm:   regular  History of Present Illness: This 77 year old white married female with multiple medical problems is in to receive her vitamin B 12 injection also she relates that she has a recurrence of the infection and base of her right incisor tooth which has occurred for the third time she was seen initially by him and initially treat her and then later Dr. Maricela Curet also for clindamycin so she has had 2 rounds of this medication She now has a recurrence of the infection and desires treatment 2 continue treatment for polymyalgia rheumatica and received her vitamin B12 for phonation his anemia In general doing fine  Allergies: 1)  ! Asa 2)  ! Naprosyn 3)  ! Codeine 4)  ! Percocet  Review of Systems      See HPI  Physical Exam  General:  Well-developed,well-nourished,in no acute distress; alert,appropriate and cooperative throughout examinationunderweight appearing.   Head:  Normocephalic and atraumatic without obvious abnormalities. No apparent alopecia or balding. Eyes:  No corneal or conjunctival inflammation noted. EOMI. Perrla. Funduscopic exam benign, without hemorrhages, exudates or papilledema. Vision grossly normal. Nose:  External nasal examination shows no deformity or inflammation. Nasal mucosa are pink and moist without lesions or exudates. Mouth:  infected gum at the base of the right upper incisor her tender to touch erythematous and swollen Lungs:  Normal respiratory effort, chest expands symmetrically. Lungs are clear to auscultation, no crackles or wheezes. Heart:  Normal rate and regular rhythm. S1 and S2 normal without gallop, murmur, click, rub or other extra sounds.   Impression & Recommendations:  Problem # 1:  ABSCESS, TOOTH (ICD-522.5) Assessment Deteriorated  Levaquin 500 milligrams q.d. x14  day  Orders: Prescription Created Electronically [phone])  Problem # 2:  POLYMYALGIA RHEUMATICA (ICD-725) Assessment: Improved  Problem # 3:  HYPERTENSION (ICD-401.9) Assessment: Improved  Her updated medication list for this problem includes:    Metoprolol Tartrate 50 Mg Tabs (Metoprolol tartrate) .Marland Kitchen... Take one tab  by mouth two times a day  Problem # 4:  EMPHYSEMA (ICD-492.8) Assessment: Unchanged  Problem # 5:  PERNICIOUS ANEMIA (ICD-281.0) Assessment: Improved vitamin B12  Complete Medication List: 1)  Vitamin D 98119 Unit Caps (Ergocalciferol) 2)  Fosamax 70 Mg Tabs (Alendronate sodium) .Marland KitchenMarland KitchenMarland Kitchen 1 weekly 3)  Prednisone  .... Dr Kellie Simmering 4)  Clindamycin Hcl 300 Mg Caps (Clindamycin hcl) .Marland Kitchen.. 1 cap by mouth 4 x daily x 10 days 5)  Omega 3  .... 2 once a day 6)  Metoprolol Tartrate 50 Mg Tabs (Metoprolol tartrate) .... Take one tab  by mouth two times a day 7)  Levaquin 500 Mg Tabs (Levofloxacin) .... One q.d. for 14 days  Patient Instructions: 1)  take Levaquin 500 mg he stayed for 14 days 2)  Recommend returning to the dentist Prescriptions: LEVAQUIN 500 MG TABS (LEVOFLOXACIN) one q.d. for 14 days  #14 x 0   Entered and Authorized by:   Judithann Sheen MD   Signed by:   Judithann Sheen MD on 02/14/2010   Method used:   Electronically to        [facility] [phone]* (retail)       [address]       [place], [place]  [address]  Ph: [phone]       Fax: [phone]   RxID:   [id number]    Orders Added: 1)  Prescription Created Electronically [G8553] 2)  Est. Patient Level IV [62952]

## 2010-02-23 NOTE — Assessment & Plan Note (Signed)
Summary: b12 inj//ccm  Medications Added METOPROLOL TARTRATE 50 MG TABS (METOPROLOL TARTRATE) take one tab  by mouth two times a day       Nurse Visit   Allergies: 1)  ! Asa 2)  ! Naprosyn 3)  ! Codeine 4)  ! Percocet  Medication Administration  Injection # 1:    Medication: Vit B12 1000 mcg    Diagnosis: PERNICIOUS ANEMIA (ICD-281.0)    Route: IM    Site: L deltoid    Exp Date: 08/23/2011    Lot #: [id number]    Mfr: app ph    Patient tolerated injection without complications    Given by: Kern Reap CMA Duncan Dull) (January 17, 2010 10:45 AM)  Orders Added: 1)  Vit B12 1000 mcg [J3420] 2)  Admin of Therapeutic Inj  intramuscular or subcutaneous [[id number]] Prescriptions: METOPROLOL TARTRATE 50 MG TABS (METOPROLOL TARTRATE) take one tab  by mouth two times a day  #180 x 0   Entered by:   Kern Reap CMA (AAMA)   Authorized by:   Judithann Sheen MD   Signed by:   Kern Reap CMA (AAMA) on 01/17/2010   Method used:   Faxed to ...       [facility] (mail-order)             , [place]         Ph: [phone]       Fax: [phone]   RxID:   [id number] METOPROLOL TARTRATE 50 MG TABS (METOPROLOL TARTRATE) take one tab  by mouth two times a day  #60 x 0   Entered by:   Kern Reap CMA (AAMA)   Authorized by:   Judithann Sheen MD   Signed by:   Kern Reap CMA Duncan Dull) on 01/17/2010   Method used:   Electronically to        [facility] [phone]* (retail)       [address]       [place], [place]  [address]       Ph: [phone]       Fax: [phone]   RxID:   [id number]

## 2010-03-14 ENCOUNTER — Ambulatory Visit (INDEPENDENT_AMBULATORY_CARE_PROVIDER_SITE_OTHER): Payer: Medicare Other | Admitting: Family Medicine

## 2010-03-14 ENCOUNTER — Other Ambulatory Visit: Payer: Self-pay

## 2010-03-14 VITALS — BP 130/80

## 2010-03-14 DIAGNOSIS — D518 Other vitamin B12 deficiency anemias: Secondary | ICD-10-CM

## 2010-03-14 MED ORDER — CYANOCOBALAMIN 1000 MCG/ML IJ SOLN
1000.0000 ug | Freq: Once | INTRAMUSCULAR | Status: AC
  Administered 2010-03-14: 1000 ug via INTRAMUSCULAR

## 2010-03-14 NOTE — Telephone Encounter (Signed)
Wife wantted clotraimazole cream for spouse to be called [facility] [address] [facility] rx already called in.

## 2010-04-11 ENCOUNTER — Telehealth: Payer: Self-pay | Admitting: Family Medicine

## 2010-04-11 ENCOUNTER — Ambulatory Visit (INDEPENDENT_AMBULATORY_CARE_PROVIDER_SITE_OTHER): Payer: Medicare Other | Admitting: Family Medicine

## 2010-04-11 DIAGNOSIS — D510 Vitamin B12 deficiency anemia due to intrinsic factor deficiency: Secondary | ICD-10-CM

## 2010-04-11 MED ORDER — CYANOCOBALAMIN 1000 MCG/ML IJ SOLN
1000.0000 ug | Freq: Once | INTRAMUSCULAR | Status: AC
  Administered 2010-04-11: 1000 ug via INTRAMUSCULAR

## 2010-04-11 NOTE — Telephone Encounter (Signed)
Pt given b12 injection lot # [id number] exp aug 13

## 2010-05-09 ENCOUNTER — Ambulatory Visit (INDEPENDENT_AMBULATORY_CARE_PROVIDER_SITE_OTHER): Payer: Medicare Other | Admitting: Family Medicine

## 2010-05-09 DIAGNOSIS — E538 Deficiency of other specified B group vitamins: Secondary | ICD-10-CM

## 2010-05-09 MED ORDER — CYANOCOBALAMIN 1000 MCG/ML IJ SOLN
1000.0000 ug | Freq: Once | INTRAMUSCULAR | Status: AC
  Administered 2010-05-09: 1000 ug via INTRAMUSCULAR

## 2010-06-06 ENCOUNTER — Ambulatory Visit (INDEPENDENT_AMBULATORY_CARE_PROVIDER_SITE_OTHER): Payer: Medicare Other | Admitting: Family Medicine

## 2010-06-06 ENCOUNTER — Other Ambulatory Visit: Payer: Self-pay

## 2010-06-06 DIAGNOSIS — D510 Vitamin B12 deficiency anemia due to intrinsic factor deficiency: Secondary | ICD-10-CM

## 2010-06-06 MED ORDER — METOPROLOL TARTRATE 50 MG PO TABS: 50.0000 mg | ORAL_TABLET | Freq: Two times a day (BID) | ORAL | Status: DC

## 2010-06-06 MED ORDER — CYANOCOBALAMIN 1000 MCG/ML IJ SOLN
1000.0000 ug | Freq: Once | INTRAMUSCULAR | Status: AC
  Administered 2010-06-06: 1000 ug via INTRAMUSCULAR

## 2010-06-06 NOTE — Telephone Encounter (Signed)
Refill metoprolol sent to [facility]

## 2010-06-09 NOTE — Discharge Summary (Signed)
NAMEMATTIA, [name]           ACCOUNT NO.:  [id number]   MEDICAL RECORD NO.:  [id number]          PATIENT TYPE:  INP   LOCATION:  1516                         FACILITY:  [facility]   PHYSICIAN:  John L. Rendall, M.D.  DATE OF BIRTH:  [birth year]   DATE OF ADMISSION:  07/22/2005  DATE OF DISCHARGE:  08/01/2005                                 DISCHARGE SUMMARY   ADDENDUM:   DISCHARGE DIAGNOSES:  Left lesser trochanteric fracture after surgery, now  status post open reduction and internal fixation.   SURGICAL. PROCEDURE:  Additional surgical procedure was performed on July 30, 2005, where an open reduction and cable fixation of the displaced lesser  trochanteric fracture was done by Dr. Jonny Ruiz L. Rendall, assisted by Jacqualine Code, PA-C.   COMPLICATIONS:  None.   ADDENDUM TO HOSPITAL COURSE:  [name] planned to be discharged on July  6.  A SNF bed was not available at that time, so she remained in the  hospital.  Due to continued complaints of pain and noticing by the daughter  that she was walking kind of funny, x-rays were reviewed on July 7.  At that  point, it showed a displaced lesser troch fracture of the left hip.  Her  Arixtra stopped at that time and plans were made for cable fixation of that.  That was performed on July 9.  She tolerated that well.  On July 10,  hemoglobin 9.8, hematocrit 29.1.  Leg was neurovascularly intact.  X-ray at  that time showed appropriate fixation of that lesser troch fracture and the  prosthesis in good position.  She was continued on Arixtra and weaned to  p.o. pain medicine.   On July 11, she is feeling better.  She reports the leg feels better today.  She had less pain when she was up ambulating.  Temperature 99.4, pulse now  84.  Hemoglobin 10.1, hematocrit 30.2.  It is felt she is ready for transfer  to [facility] today and will be DCd home later today.  She  does have a chronic cyst on her posterior left thigh that  has been red with  a whitish top to it and she will follow up with her medical doctor for that.   DISCHARGE INSTRUCTIONS:  Please see discharge instructions dictated  previously.  No change in her medicines except the Arixtra now needs to be  continued at 2.5 mg subcu q.8 a.m. with the last dose to be on August 09, 2005.  No other change in her meds at this time.  No change in wound care  instructions.  She needs to follow up with Dr. Priscille Kluver in our office on  Tuesday, August 14, 2005.  You need to call [phone] for that appointment.   LABORATORY DATA:  On July 9, hemoglobin was 10.5.  On July 11, hemoglobin  10.1, hematocrit 30.2.  All the laboratory studies were within normal  limits.      Legrand Pitts Duffy, P.A.      John L. Rendall, M.D.  Electronically Signed    KED/MEDQ  D:  08/01/2005  T:  08/01/2005  Job:  [id number]

## 2010-06-09 NOTE — Discharge Summary (Signed)
NAMEITZA, MANIACI           ACCOUNT NO.:  [id number]   MEDICAL RECORD NO.:  [id number]          PATIENT TYPE:  INP   LOCATION:  1619                         FACILITY:  [facility]   PHYSICIAN:  John L. Rendall, M.D.  DATE OF BIRTH:  Jul 29, 1928   DATE OF ADMISSION:  07/22/2005  DATE OF DISCHARGE:  07/27/2005                                 DISCHARGE SUMMARY   ADMITTING DIAGNOSES:  1.  Displaced left femoral neck fracture.  2.  Polymyalgia rheumatica.  3.  Irritable bowel syndrome.  4.  History of tachycardia.   DISCHARGE DIAGNOSES:  1.  Left femoral neck fracture, status post left hip hemiarthroplasty.  2.  Acute blood loss anemia, secondary to surgery.  3.  Constipation.  4.  Polymyalgia rheumatica.  5.  History of tachycardia.  6.  History irritable bowel syndrome.   SURGICAL PROCEDURE:  On July 22, 2005, Ms. Gotts underwent a left-hip  bipolar hemiarthroplasty by Dr. Jonny Ruiz L. Rendall, assisted by Jacqualine Code,  P.A.-C.  She has an AML small-stature 155 mm length 40 mm off-set size 12  femoral stem placed with a self-centering bipolar head, internal diameter 28  mm, external diameter 46 mm, with a 28 mm +1.5 neck length, 1214 cone and  femoral head.   COMPLICATIONS:  None.   CONSULTATIONS:  1.  Case management physical therapy consult, July 23, 2005.  2.  Occupational therapy consult, July 24, 2005.  3.  Rehab medicine consult, July 25, 2005.   HISTORY OF PRESENT ILLNESS:  This 77 year old white female patient presented  to the emergency department after falling at home while she was out in the  yard at about 5 p.m.  She was brought to the emergency department with  complaints of left-hip pain and it was found she had a displaced left  femoral neck fracture.  She is being admitted for surgical fixation of  fracture.   HOSPITAL COURSE:  Ms. Mcfadden tolerated her surgical procedure well,  without immediate complications.  She was transferred to [facility].  On  postoperative day #1, left leg was neurovascularly intact, hemoglobin 11.3,  hematocrit 32.8.  She was complaining of a fair amount of pain, meds were  adjusted.  Blood pressure was a little bit low at 95-136/48-76.  Meds were  adjusted.  Potassium was high at 5 and that was held.  She was started on  therapy per protocol.   On postoperative day #2, hemoglobin 9.5, hematocrit 27.8.  Left-hip incision  was well-approximated with staples.  It was felt she might be too much to  handle for her husband at home and plans were begun for placement after  discharge.  A rehab consult was obtained.   She made good progress over the next several days.  Potassium did drop to  3.4 and that was supplemented.  She did complain of some left-foot pain,  which x-rays were done, which showed no signs of any fracture.  Her  hemoglobin did drop to 8.7 on July 5, and she was transfused with one unit  of packed red blood cells.   On July 6, she is  feeling better.  Pain is well-controlled.  She is  afebrile, vitals stable.  Left-hip incision is well-approximated with  staples.  Mild serous drainage from the hip.  It is felt she is ready for  transfer to a skilled facility and will be transferred there later today.  She is having some difficulty with constipation that will be treated with a  laxative prior to discharge.   DISCHARGE INSTRUCTIONS:  ACTIVITY:  She can be out of bed, weightbearing as  tolerated on the left leg with use of a walker.  She is to have PT and OT  per rehab protocols.   DIET:  She can continue her regular pre-hospitalization diet.   MEDICATIONS:  1.  Colace 100 mg p.o. twice daily.  2.  Arixtra 2.5 mg subcu q.8 a.m. with the last dose to be August 01, 2005.  3.  Toprol XL 25 mg one tablet p.o. q.a.m.  4.  Prednisone 5 mg one tablet p.o. q.a.m.  5.  Senokot S one tablet p.o. q.h.s. p.r.n. constipation.  6.  Percocet one to two tablets p.o. q.4h. p.r.n. for pain.  7.  Tylenol one to  two tablets p.o. q.4h. p.r.n. for pain or fever greater      than 101.5.  8.  Robaxin 500 mg one tablet p.o. q.6h. p.r.n. spasms.  9.  Enema of choice as needed for constipation.  10. Vitamin B12 one injection IM q. month.  11. Once she is discharged from the skilled facility, she needs to resume      her Actonel one tablet every two weeks.   WOUND CARE:  Please keep the left-hip incision clean and dry and paint it  with Betadine once a day and change the dressing.  If there are any signs of  infection, fever of greater than 101.5, chills, pain unrelieved by pain  meds, or foul-smelling drainage from the wound, please notify Dr. Priscille Kluver.   FOLLOWUP:  She needs to follow up with Dr. Priscille Kluver in our office at the end  of next week, either Thursday or Friday and you need to call [phone] for  that appointment.  This is either July 12 or 13.  She needs to follow up  with her regular medical doctor per their office.   LABORATORY DATA:  X-ray taken of her left hip on July 22, 2005, shows left-  hip prosthesis with questionable fracture fragment at the lesser trochanter.   On July 5, she had a left-foot film, which showed an old osteopenia, no  acute osseous abnormality.   Hemoglobin and hematocrit have ranged from 13.6 and 39.3 on July 1, to a low  of 8.7 and 25.4 on July 5.  White count and platelets have been within  normal limits.   Sodium dropped to a low of 133 on July 4 with a potassium low at 3.4 on July  4 also.  Glucose has ranged from 95 on July 4, to a high of 159 on July 2.  Calcium ranged from 9.1 on July 1, to a low of 7.7 on July 3.  Total  bilirubin was high at 1.3 on July 1.  All other laboratory studies were  within normal limits.      Legrand Pitts Duffy, P.A.      John L. Rendall, M.D.  Electronically Signed    KED/MEDQ  D:  07/27/2005  T:  07/27/2005  Job:  [id number]   cc:   Aundra Dubin, M.D.  [address]  [place]  [place] [address]

## 2010-06-09 NOTE — Op Note (Signed)
[name], [name]           ACCOUNT NO.:  [id number]   MEDICAL RECORD NO.:  [id number]          PATIENT TYPE:  INP   LOCATION:  1619                         FACILITY:  [facility]   PHYSICIAN:  John L. Rendall, M.D.  DATE OF BIRTH:  02-23-1928   DATE OF PROCEDURE:  07/30/2005  DATE OF DISCHARGE:                                 OPERATIVE REPORT   PREOPERATIVE DIAGNOSIS:  Displaced fracture lesser trochanter, left hip.   SURGICAL PROCEDURE:  Open reduction with cable fixation displaced lesser  trochanter with prophylactic banding of the proximal femur below.   SURGEON:  Dr. Priscille Kluver.   ASSISTANT:  Jacqualine Code, P.A.-C.   ANESTHESIA:  General.   PATHOLOGY:  The patient is status post displaced femoral neck fracture and  at the time of hemiarthroplasty, a little crack was seen at lesser  trochanter.  However, follow-up pictures revealed some time between when the  femur was examined and the hip closed and taken off the table, that total  displacement of the lesser trochanter had occurred.  This causes a continued  symptomatic weakness in flexion and pain with weightbearing of the hip.  It  is discussed with the patient.  She wants surgical repair for full return of  strength if possible  so she can do her gardening.   PROCEDURE:  Under general anesthesia, the patient is placed in the right  lateral decubitus position.  The hip was prepared with DuraPrep and draped  as a sterile field.  The same incision is opened in the distal two-thirds  and extended 2-1/2 inches.  Dissection was carried through the IT band.  It  is necessary to release some of the gluteus maximus insertion.  The fracture  defect is palpated on the medial side of the femur and just below it.  A  cable is passed out of __________  that is chrome cobalt, same metal as the  hemiarthroplasty and it is placed about 1/2 inch below the fracture line to  protect the distal femur.  There is tensioned in a moderate  tension to  protect it.  Once this was done, the hip was manipulated in such a way as to  allow access to the fracture fragment.  Hip extension and abduction seems to  help.  The fragment is freed from clot and beginning scar tissue.  It is  reduced with some difficulty as it wants to flex and migrate proximally  about an inch.  It is pulled down and held in place with bone clamps.  Bone  quality is relatively poor and the clamps tend to cut into the bone.  Two  cables are placed; 1 at the mid portion of the lesser trochanter and 1 near  the distal V of the fragment.  Once the cables were tightened and crimped  and excess removed, pictures AP and lateral are obtained.  They reveal  satisfactory reduction of the fracture fragment with the hip reduced and  everything in satisfactory position and alignment.  At this point the hip  was  washed out.  The gluteus maximus was reattached through two  #1 Tycron  through drill holes and sutured to bone.  The short external rotators and  piriformis were reattached.  The IT band was closed with #1 Tycron, subcu  with Vicryl and skin with clips.  Operative time approximately 1 hour.      John L. Rendall, M.D.  Electronically Signed     JLR/MEDQ  D:  07/30/2005  T:  07/30/2005  Job:  [id number]

## 2010-06-09 NOTE — Op Note (Signed)
NAMELILIAS, [name]           ACCOUNT NO.:  [id number]   MEDICAL RECORD NO.:  [id number]          PATIENT TYPE:  INP   LOCATION:  0109                         FACILITY:  [facility]   PHYSICIAN:  John L. Rendall, M.D.  DATE OF BIRTH:  22-May-1928   DATE OF PROCEDURE:  07/22/2005  DATE OF DISCHARGE:                                 OPERATIVE REPORT   PREOPERATIVE DIAGNOSIS:  Displaced left femoral neck fracture.   SURGICAL PROCEDURES:  AML left bipolar hemiarthroplasty.   POSTOPERATIVE DIAGNOSIS:  AML left bipolar hemiarthroplasty.   SURGEON:  John L. Rendall, M.D.   ASSISTANTArlys John D. Petrarca, P.A.-C.   ANESTHESIA:  General.   PATHOLOGY:  This 77 year old white female fell in her garden today.  She is  physically active and still, however has a history of chronic steroid use.  Her bone quality, however, did not appear to be terrible.  It was mild to  moderately osteoporotic based on findings at surgery.   PROCEDURE:  Under general anesthetic the patient was placed in the right  lateral decubitus position and posterior incision was made splitting the IT  band in the line of its fibers.  Charnley retractor was inserted.  The short  external rotators and hip capsule were taken down with electrocautery from  bone.  The hip capsule was then opened in a T-shaped manner.  The superior  femoral neck at the fracture was then exposed.  The IM initiator and canal  finder were used.  Progressive reaming to 11.5 mm gave good chatter fit.  Using a template, the femoral neck was osteotomized. The rasping of the  femur was then done with a 10.5 and a 12 narrow rasp and a calcar reamer  used on the 12.  Minor splitting of the superior neck was noted as the rasp  seated but this did not propagate as we checked below the lesser trochanter  and it only extended about 8 mm.  At this point the femoral head was  removed.  It was measured between a 45 and 46.  Trial seating of 45 and 46  hip ball  revealed better seating with the 46.  Care was taken to remove the  ligamentum teres and all debris from the acetabulum.  The hip was then  assembled and trial components using the rasp and 28 hip ball and 46 bipolar  head.  With this in place the hip had excellent range of motion and very  good stability.  Permanent components were then obtained and a 12 mm narrow  AML stem was inserted with care taken to avoid any excessive force no  propagation of the crack in the femoral neck occurred.  The stem was seated  to within a millimeter of the calcar.  Once this was seated, the hip ball  and bipolar head were inserted after carefully cleaning the trunnion. The  hip was then reduced and care was taken to keep the hip capsule of the  joint.  Once this was out of the joint, trial range of motion and stability  were excellent.  The hip capsule was then closed  with #1  Tycron, the piriformis was reattached with #1 Tycron.  The wound was then  closed with mattress sutures of #1 Tycron, subcu with 0 Vicryl and skin  clips.  Operative time approximately 40 minutes.  Blood loss approximately  150 mL.  The patient tolerated the procedure well and returned to recovery  in good condition.      John L. Rendall, M.D.  Electronically Signed     JLR/MEDQ  D:  07/22/2005  T:  07/23/2005  Job:  [id number]

## 2010-07-01 ENCOUNTER — Emergency Department (HOSPITAL_COMMUNITY): Payer: Medicare Other

## 2010-07-01 ENCOUNTER — Inpatient Hospital Stay (HOSPITAL_COMMUNITY)
Admission: EM | Admit: 2010-07-01 | Discharge: 2010-07-06 | DRG: 470 | Disposition: A | Discharge status: Home / Self Care | Payer: Medicare Other | Attending: Internal Medicine | Admitting: Internal Medicine

## 2010-07-01 DIAGNOSIS — E538 Deficiency of other specified B group vitamins: Secondary | ICD-10-CM | POA: Diagnosis present

## 2010-07-01 DIAGNOSIS — R55 Syncope and collapse: Secondary | ICD-10-CM | POA: Diagnosis present

## 2010-07-01 DIAGNOSIS — S72033A Displaced midcervical fracture of unspecified femur, initial encounter for closed fracture: Principal | ICD-10-CM | POA: Diagnosis present

## 2010-07-01 DIAGNOSIS — D62 Acute posthemorrhagic anemia: Secondary | ICD-10-CM | POA: Diagnosis not present

## 2010-07-01 DIAGNOSIS — I10 Essential (primary) hypertension: Secondary | ICD-10-CM | POA: Diagnosis present

## 2010-07-01 DIAGNOSIS — D7589 Other specified diseases of blood and blood-forming organs: Secondary | ICD-10-CM | POA: Diagnosis present

## 2010-07-01 DIAGNOSIS — F10229 Alcohol dependence with intoxication, unspecified: Secondary | ICD-10-CM | POA: Diagnosis present

## 2010-07-01 DIAGNOSIS — Z7901 Long term (current) use of anticoagulants: Secondary | ICD-10-CM

## 2010-07-01 DIAGNOSIS — IMO0002 Reserved for concepts with insufficient information to code with codable children: Secondary | ICD-10-CM

## 2010-07-01 DIAGNOSIS — K006 Disturbances in tooth eruption: Secondary | ICD-10-CM | POA: Diagnosis present

## 2010-07-01 DIAGNOSIS — M353 Polymyalgia rheumatica: Secondary | ICD-10-CM | POA: Diagnosis present

## 2010-07-01 LAB — CBC
MCH: 34.6 pg — ABNORMAL HIGH (ref 26.0–34.0)
MCHC: 34.8 g/dL (ref 30.0–36.0)
WBC: 7.1 10*3/uL (ref 4.0–10.5)

## 2010-07-01 LAB — ETHANOL: Alcohol, Ethyl (B): 223 mg/dL — ABNORMAL HIGH (ref 0–10)

## 2010-07-01 LAB — DIFFERENTIAL
Lymphs Abs: 2.4 10*3/uL (ref 0.7–4.0)
Monocytes Absolute: 0.7 10*3/uL (ref 0.1–1.0)

## 2010-07-01 LAB — SAMPLE TO BLOOD BANK

## 2010-07-01 LAB — POCT I-STAT, CHEM 8
BUN: 7 mg/dL (ref 6–23)
Calcium, Ion: 1.06 mmol/L — ABNORMAL LOW (ref 1.12–1.32)
Sodium: 138 mEq/L (ref 135–145)

## 2010-07-01 LAB — PROTIME-INR: Prothrombin Time: 13.1 seconds (ref 11.6–15.2)

## 2010-07-02 ENCOUNTER — Inpatient Hospital Stay (HOSPITAL_COMMUNITY): Payer: Medicare Other

## 2010-07-02 LAB — TYPE AND SCREEN
ABO/RH(D): A POS
Antibody Screen: NEGATIVE

## 2010-07-02 LAB — URINALYSIS, ROUTINE W REFLEX MICROSCOPIC
Bilirubin Urine: NEGATIVE
Hgb urine dipstick: NEGATIVE
Nitrite: NEGATIVE
Specific Gravity, Urine: 1.011 (ref 1.005–1.030)
pH: 6 (ref 5.0–8.0)

## 2010-07-02 LAB — CBC
Hemoglobin: 13 g/dL (ref 12.0–15.0)
MCH: 34.9 pg — ABNORMAL HIGH (ref 26.0–34.0)
RBC: 3.73 MIL/uL — ABNORMAL LOW (ref 3.87–5.11)
WBC: 8.1 10*3/uL (ref 4.0–10.5)

## 2010-07-02 LAB — BASIC METABOLIC PANEL
CO2: 30 mEq/L (ref 19–32)
Calcium: 8.3 mg/dL — ABNORMAL LOW (ref 8.4–10.5)
Chloride: 102 mEq/L (ref 96–112)
Glucose, Bld: 105 mg/dL — ABNORMAL HIGH (ref 70–99)
Sodium: 139 mEq/L (ref 135–145)

## 2010-07-02 LAB — ABO/RH: ABO/RH(D): A POS

## 2010-07-02 LAB — MRSA PCR SCREENING: MRSA by PCR: NEGATIVE

## 2010-07-03 ENCOUNTER — Inpatient Hospital Stay (HOSPITAL_COMMUNITY): Payer: Medicare Other

## 2010-07-03 LAB — URINE CULTURE
Colony Count: NO GROWTH
Culture: NO GROWTH

## 2010-07-03 LAB — DIFFERENTIAL
Basophils Absolute: 0 10*3/uL (ref 0.0–0.1)
Basophils Relative: 0 % (ref 0–1)
Eosinophils Absolute: 0 10*3/uL (ref 0.0–0.7)
Eosinophils Relative: 0 % (ref 0–5)
Lymphocytes Relative: 3 % — ABNORMAL LOW (ref 12–46)

## 2010-07-03 LAB — COMPREHENSIVE METABOLIC PANEL
ALT: 8 U/L (ref 0–35)
AST: 24 U/L (ref 0–37)
Calcium: 7.7 mg/dL — ABNORMAL LOW (ref 8.4–10.5)
Creatinine, Ser: 0.83 mg/dL (ref 0.4–1.2)
GFR calc Af Amer: >60 mL/min (ref >60)
Sodium: 136 mEq/L (ref 135–145)
Total Protein: 6.1 g/dL (ref 6.0–8.3)

## 2010-07-03 LAB — CBC
MCHC: 33.5 g/dL (ref 30.0–36.0)
Platelets: 163 10*3/uL (ref 150–400)
RDW: 13.8 % (ref 11.5–15.5)
WBC: 12.8 10*3/uL — ABNORMAL HIGH (ref 4.0–10.5)

## 2010-07-03 LAB — PROTIME-INR
INR: 1.38 (ref 0.00–1.49)
Prothrombin Time: 17.2 seconds — ABNORMAL HIGH (ref 11.6–15.2)

## 2010-07-03 LAB — PHOSPHORUS: Phosphorus: 4.2 mg/dL (ref 2.3–4.6)

## 2010-07-04 LAB — COMPREHENSIVE METABOLIC PANEL
Alkaline Phosphatase: 59 U/L (ref 39–117)
BUN: 19 mg/dL (ref 6–23)
Calcium: 8.2 mg/dL — ABNORMAL LOW (ref 8.4–10.5)
Creatinine, Ser: 0.94 mg/dL (ref 0.4–1.2)
GFR calc Af Amer: >60 mL/min (ref >60)
Glucose, Bld: 149 mg/dL — ABNORMAL HIGH (ref 70–99)
Total Protein: 6.1 g/dL (ref 6.0–8.3)

## 2010-07-04 LAB — MAGNESIUM: Magnesium: 2.5 mg/dL (ref 1.5–2.5)

## 2010-07-04 LAB — CBC
Hemoglobin: 11.8 g/dL — ABNORMAL LOW (ref 12.0–15.0)
MCH: 35.1 pg — ABNORMAL HIGH (ref 26.0–34.0)
Platelets: 195 10*3/uL (ref 150–400)
RBC: 3.36 MIL/uL — ABNORMAL LOW (ref 3.87–5.11)
WBC: 15.5 10*3/uL — ABNORMAL HIGH (ref 4.0–10.5)

## 2010-07-04 LAB — HEMOGLOBIN A1C: Mean Plasma Glucose: 117 mg/dL — ABNORMAL HIGH (ref <117)

## 2010-07-04 LAB — TSH: TSH: 0.352 u[IU]/mL (ref 0.350–4.500)

## 2010-07-04 LAB — PROTIME-INR
Prothrombin Time: 32.8 seconds — ABNORMAL HIGH (ref 11.6–15.2)
Prothrombin Time: 38.2 seconds — ABNORMAL HIGH (ref 11.6–15.2)

## 2010-07-05 ENCOUNTER — Ambulatory Visit: Payer: Medicare Other | Admitting: Family Medicine

## 2010-07-05 LAB — BASIC METABOLIC PANEL
BUN: 20 mg/dL (ref 6–23)
CO2: 26 mEq/L (ref 19–32)
Calcium: 8.1 mg/dL — ABNORMAL LOW (ref 8.4–10.5)
Creatinine, Ser: 0.73 mg/dL (ref 0.4–1.2)
Glucose, Bld: 141 mg/dL — ABNORMAL HIGH (ref 70–99)

## 2010-07-05 LAB — CBC
Hemoglobin: 10.2 g/dL — ABNORMAL LOW (ref 12.0–15.0)
MCH: 34.3 pg — ABNORMAL HIGH (ref 26.0–34.0)
MCV: 101 fL — ABNORMAL HIGH (ref 78.0–100.0)
Platelets: 191 10*3/uL (ref 150–400)
RBC: 2.97 MIL/uL — ABNORMAL LOW (ref 3.87–5.11)

## 2010-07-05 LAB — DIFFERENTIAL
Eosinophils Absolute: 0 10*3/uL (ref 0.0–0.7)
Lymphs Abs: 0.6 10*3/uL — ABNORMAL LOW (ref 0.7–4.0)
Monocytes Relative: 7 % (ref 3–12)
Neutrophils Relative %: 88 % — ABNORMAL HIGH (ref 43–77)

## 2010-07-05 NOTE — Consult Note (Signed)
  [name], [name]           ACCOUNT NO.:  [id number]  MEDICAL RECORD NO.:  [id number]  LOCATION:  5005                         FACILITY:  [facility]  PHYSICIAN:  Georgia Lopes, M.D.  DATE OF BIRTH:  18-Apr-1928  DATE OF CONSULTATION:  07/03/2010 DATE OF DISCHARGE:                                CONSULTATION   I was asked to see this 77 year old pleasant female for chief complaint of right jaw pain.  She was admitted on July 02, 2010, after a syncopal episode, which she fractured her right hip.  She also hit her head, but did not have any fractures noted.  HISTORY OF PRESENT ILLNESS:  The tooth has been treated by another oral surgeon in [place] for approximately 6 months with multiple courses of antibiotics with partial denture adjustment to relieve pressure overlying the tooth, however, the patient continued to have episodic pain and swelling.  PAST MEDICAL HISTORY:  Significant for: 1. Polymyalgia rheumatica. 2. Pernicious anemia. 3. Hypertension. 4. Osteoarthritis. 5. Alcohol abuse/alcoholism.  MEDICATIONS:  Thiamine, folic acid, multivitamins, lorazepam, hydrocortisone injection 100 mg q.8 h., Lopressor 25 mg b.i.d., Coumadin per protocol, Unasyn, and hydromorphone, acetaminophen, and oxycodone.  PAST SURGICAL HISTORY:  Status post right total hip replacement, July 03, 2010; history of appendectomy and left total hip replacement approximately 5 years ago.  ALLERGIES:  ASPIRIN, CODEINE, and NSAIDs.  PHYSICAL EXAMINATION:  GENERAL:  An 77 year old female, status post right hip replacement, in no acute distress. VITAL SIGNS:  Stable, afebrile. HEAD:  Normocephalic, atraumatic. EYES:  Pupils equal, round, reactive to light accommodation. Extraocular motions intact. ORAL:  Edentulous, maxilla with full dentures in place, lower partial present with abutments on approximately 4 teeth remaining in the anterior region, partially erupted tooth #32 with edematous  mucosa around this tooth, but no frank pus, no fluctuance.  Pharynx was clear. No trismus. NECK:  Supple.  No JVD. HEART:  Regular rate and rhythm. LUNGS:  Clear. ABDOMEN:  Soft, nontender. EXTREMITIES:  Without cyanosis, clubbing, or edema. NEUROLOGIC:  Alert and oriented x3.  Cranial nerves intact.  Radiographic revealed impacted teeth numbers 16, 17, 32 with no acute abscesses.  There is a mild lucency around the crown of tooth #32.  IMPRESSION:  An 77 year old white female status post right hip replacement with infected tooth #32.  PLAN:  To operating room tomorrow for coronectomy or removal of crown of tooth #32 or possible extraction of tooth #32.  Risks and complications were discussed with the patient including pain, swelling, bleeding, infection, numbness, fracture of the jaw either during surgery or afterwards due to weakening of the jaw.  She understood the complications, asked questions and elects to proceed with the surgery.     Georgia Lopes, M.D.     SMJ/MEDQ  D:  07/03/2010  T:  07/04/2010  Job:  [id number]  Electronically Signed by Ocie Doyne M.D. on 07/05/2010 07:39:17 AM

## 2010-07-06 LAB — CBC
MCH: 34.7 pg — ABNORMAL HIGH (ref 26.0–34.0)
MCHC: 33.8 g/dL (ref 30.0–36.0)
MCV: 102.8 fL — ABNORMAL HIGH (ref 78.0–100.0)
Platelets: 203 10*3/uL (ref 150–400)
RDW: 14.2 % (ref 11.5–15.5)

## 2010-07-06 LAB — BASIC METABOLIC PANEL
BUN: 23 mg/dL (ref 6–23)
Calcium: 7.9 mg/dL — ABNORMAL LOW (ref 8.4–10.5)
GFR calc non Af Amer: >60 mL/min (ref >60)
Glucose, Bld: 133 mg/dL — ABNORMAL HIGH (ref 70–99)

## 2010-07-06 NOTE — Op Note (Signed)
  NAMEGWENDA, [name]           ACCOUNT NO.:  [id number]  MEDICAL RECORD NO.:  [id number]  LOCATION:  5005                         FACILITY:  [facility]  PHYSICIAN:  Georgia Lopes, M.D.  DATE OF BIRTH:  1928/11/13  DATE OF PROCEDURE:  07/05/2010 DATE OF DISCHARGE:                              OPERATIVE REPORT   PREOPERATIVE DIAGNOSIS:  Impacted tooth #32.  POSTOPERATIVE DIAGNOSIS:  Impacted tooth #32.  PROCEDURE:  Removal of tooth #32.  SURGEON:  Georgia Lopes, MD  ANESTHESIA:  General, Dr. Michelle Piper attending, oral intubation.  ASSISTANT:  Luberta Mutter, DOMA  INDICATIONS FOR PROCEDURE:  [name] is an 77 year old female who was admitted on July 02, 2010, after fracturing her right hip.  She underwent total hip replacement on July 03, 2010.  She has had a infection off and on in the impacted tooth #32 for approximately 6 months to a year, and has been treated with antibiotic saline rinses, and adjustment of her partial denture with no resolution of the problem. The tooth is partially erupted and such harbors bacteria beneath the gingival tissue impacting the tooth.  Because of the fear of infection seating into the recent hip, replacement site was recommended that the patient have removal of tooth #32.  PROCEDURE IN DETAIL:  The patient was taken to the operating room, placed on the table in supine position.  General anesthesia was administered intravenously, and an oral endotracheal tube was placed and secured.  The eyes protected.  The patient was draped for the procedure. The posterior pharynx was suctioned, and a throat pack was placed.  2% lidocaine 1:100,000 with epinephrine was infiltrated in inferior alveolar block and in buccal and lingual infiltration around tooth #32, total of 7 mL was utilized, then a #15 blade was used after placing a bite block and a sweetheart retractor to make a full-thickness incision in a hockey-stick fashion overlying tooth #32.  The  periosteum was reflected with periosteal elevator.  Bone was removed around tooth #32 and tooth was sectioned in multiple pieces and removed from the mouth. The socket was then curetted, irrigated, and then the area was closed primarily with 3-0 chromic.  Bleeding was minimal.  The patient tolerated the procedure well.  She was extubated, awakened, and taken to the recovery room breathing spontaneously in good condition.  ESTIMATED BLOOD LOSS:  Minimum.  COMPLICATIONS:  None.  SPECIMENS:  None.     Georgia Lopes, M.D.     SMJ/MEDQ  D:  07/05/2010  T:  07/06/2010  Job:  [id number]  Electronically Signed by Ocie Doyne M.D. on 07/06/2010 08:32:48 AM

## 2010-07-08 ENCOUNTER — Telehealth: Payer: Self-pay | Admitting: *Deleted

## 2010-07-08 MED ORDER — WARFARIN SODIUM 1 MG PO TABS: ORAL_TABLET | ORAL | Status: DC

## 2010-07-08 NOTE — Telephone Encounter (Signed)
Myrene Buddy called and advised that she had an order to check patient's INR for 3 consecutive days per Dr. Scotty Court. Today's result was 3.4. She is taking 4 mg of coumadin and no lovenox. The patient can be reached at [phone] and Myrene Buddy can be reached at [phone] if necessary.   This was sent to Dr. Milinda Antis (on-call) and Dr. Scotty Court for [facility]

## 2010-07-08 NOTE — Telephone Encounter (Signed)
After several phone calls to both parties - was able to finally reach [name] inst to take  3mg  of coumadin today and check INR tomorrow as planned  Will call back with pharmacy number so I can call in some 1 mg coumadin  She is on it for DVT proph after a hip surgery

## 2010-07-08 NOTE — Telephone Encounter (Signed)
Need to call in coumadin 1 mg  Uses [facility] [phone]

## 2010-07-14 ENCOUNTER — Telehealth: Payer: Self-pay | Admitting: Family Medicine

## 2010-07-14 ENCOUNTER — Encounter: Payer: Self-pay | Admitting: Family Medicine

## 2010-07-14 NOTE — Telephone Encounter (Signed)
INR = 1.3

## 2010-07-14 NOTE — Telephone Encounter (Signed)
Per Dr. Scotty Court the nurse visiting pt should take a PT and report the results to our office; pt is aware.

## 2010-07-14 NOTE — Telephone Encounter (Signed)
Pts spouse called re: pts PT. Pt had her last PT drawn Tues 3.1. Pt has not had any coumedin since last Saturday night. Genevieve Norlander is sch to come to pts home today between 11:30 and 12pm. Should [facility] draw PT today or not? Pls call asap.

## 2010-07-14 NOTE — Telephone Encounter (Signed)
Per Dr. Scotty Court pt is to take 3mg  a day and have the nurse from [facility] to check pt's INR on next Tuesday.  [name] is to call and report findings.

## 2010-07-17 ENCOUNTER — Telehealth: Payer: Self-pay | Admitting: Family Medicine

## 2010-07-17 NOTE — Telephone Encounter (Signed)
[name] called and needs to get an Order from Dr Scotty Court to repeat INR. Pls fax order to fax # [phone] asap today.

## 2010-07-18 NOTE — Telephone Encounter (Signed)
Order for INR sent to [facility] on June 25,2012.

## 2010-07-21 ENCOUNTER — Telehealth: Payer: Self-pay

## 2010-07-21 ENCOUNTER — Other Ambulatory Visit: Payer: Self-pay

## 2010-07-21 MED ORDER — POLYSACCHARIDE IRON COMPLEX 150 MG PO CAPS: 150.0000 mg | ORAL_CAPSULE | Freq: Two times a day (BID) | ORAL | Status: DC

## 2010-07-21 MED ORDER — FERREX 150 FORTE PLUS 50-100 MG PO CAPS: ORAL_CAPSULE | ORAL | Status: DC

## 2010-07-21 NOTE — Telephone Encounter (Signed)
Pt is aware rx has been sent in to pharmacy.

## 2010-07-21 NOTE — Progress Notes (Signed)
Quick Note:  Pt is aware. ______ 

## 2010-07-24 NOTE — Discharge Summary (Signed)
NAMEJENNALYNN, [name]           ACCOUNT NO.:  [id number]  MEDICAL RECORD NO.:  [id number]  LOCATION:  5005                         FACILITY:  [facility]  PHYSICIAN:  Marcellus Scott, MD     DATE OF BIRTH:  1928/01/24  DATE OF ADMISSION:  07/01/2010 DATE OF DISCHARGE:  07/06/2010                        DISCHARGE SUMMARY - REFERRING   PRIMARY CARE PHYSICIAN:  Valarie Merino, MD. Montez Hageman.)  ORTHOPEDIC MD:  Claude Manges. Cleophas Dunker, M.D.  DISCHARGE DIAGNOSES: 1. Right hip fracture, status post arthroplasty on July 02, 2010. 2. Removal of impacted tooth #32 on July 06, 2010. 3. Syncope, possibly secondary to alcohol intoxication. 4. Alcohol dependence/presented with alcohol intoxication. 5. Polymyalgia rheumatica, on chronic prednisone. 6. Hypertension. 7. Mild acute blood loss anemia. 8. Macrocytosis. 9. Hypomagnesemia. 10.Vitamin B12 deficiency.  DISCHARGE MEDICATIONS: 1. Augmentin 875 mg p.o. b.i.d. for 1 week. 2. Coumadin 4 mg p.o. at 6:00 p.m. daily.  Adjust to maintain the INR     between 2 and 3. 3. Lovenox 30 mg subcutaneously at 6:00 p.m. daily.  Discontinue once     the INR is greater than 2. 4. Colace 100 mg p.o. b.i.d. 5. Folic acid 1 mg p.o. daily. 6. Vicodin 5/325, 1 tablet p.o. q.6 hourly p.r.n. pain. 7. Multivitamins 1 capsule p.o. daily. 8. Thiamine 100 mg p.o. daily. 9. Alendronate 70 mg p.o. once a month. 10.Fish oil over-the-counter 1 capsule p.o. daily. 11.Metoprolol tartrate 50 mg p.o. b.i.d. 12.Prednisone 2.5 mg p.o. daily. 13.Vitamin D over-the-counter 1 tablet p.o. daily.  PROCEDURES: 1. Right hip hemiarthroplasty on July 02, 2010. 2. Removal of impacted tooth #32 on July 06, 2010.  IMAGING: 1. Orthopantogram on June 11, impression:     a.     Multiple missing teeth.     b.     No discrete periapical abscesses or other evidence of active      infection. 2. Pelvic x-rays on June 10, impression; new right total hip without     evidence of  complication. 3. Right hip x-ray on June 10, impression; new right hip replacement     without evidence of complication. 4. Right hip x-ray on June 9, impression; acute right femoral neck     fracture with varus angulation. 5. Chest x-ray on June 9, impression; prominence of heart size and     pulmonary vascularity, likely related to supine technique.  No     focal air-space consolidation.  PERTINENT LABORATORY DATA:  Basic metabolic panel today only significant for glucose 133, INR is 1.34.  CBC; hemoglobin 9.9, hematocrit 29, white blood cell 9.5, MCV 103, platelets 203, magnesium 2.5.  Hepatic panel on June 12 only significant for albumin 2.4, vitamin B12 of 910, TSH 0.352, hemoglobin A1c 5.7.  Urine culture no growth.  The random cortisol was 9.8.  Urinalysis did not show features of urinary tract infection. Blood alcohol level on June 9 was 223.  Hemoglobin on admission was 13.  CONSULTATIONS: 1. Orthopedic, Dr. Norlene Campbell. 2. Faciomaxillary Surgery, Dr. Georgia Lopes.  DIET:  Heart-healthy diet.  ACTIVITIES: 1. Increase activity slowly and weightbearing as tolerated as per     orthopedic MD recommendations. 2. Wound care.  Dry dressing to wound  daily and to keep the wound     clean and dry and the wound can be cleaned with alcohol swabs.  COMPLAINTS:  Mild pain in the right jaw and the right hip but significantly better than admission.  She denies any other complaints.  PHYSICAL EXAMINATION:  GENERAL:  The patient is in no obvious distress. VITAL SIGNS:  Temperature 97 degrees Fahrenheit, pulse 54 per minute, respiration 18 per minute, blood pressure 145/77 mmHg, and saturating at 99% on 2 L of oxygen. RESPIRATORY:  Clear. CARDIOVASCULAR:  First and second heart sounds heard regular. ABDOMEN:  Nondistended, nontender, soft, and bowel sounds present. CENTRAL NERVOUS SYSTEM:  Patient is awake, alert, oriented x3 with no focal deficits.  The right hip surgical site  is clean, dry, and intact.  HOSPITAL COURSE:  [name] is an 77 year old female patient with history of polymyalgia rheumatica on chronic prednisone, alcohol abuse, vitamin B12 deficiency or pernicious anemia with history of recurrent dental infection on the right lower wisdom tooth.  She had completed multiple courses of antibiotics for the same.  She presented with history of syncopal episode and falling to the right side.  The alcohol intake is not clearly determined, but is more than she volunteers to. She denied any chest pain or palpitations.  She was admitted for further evaluation and management.  1. Right hip fracture, which was status post fall.  She underwent the     right hip hemiarthroplasty.  She is on prophylactic anticoagulation     per the orthopedic team.  This dictator discussed with Dr.     Cleophas Dunker who recommends total 1 month of anticoagulation with     Coumadin and would like to continue the Lovenox until the INR is     greater than 2.  The patient's spouse has given Lovenox in the past,     and he and the patient will be educated regarding self-     administration of the Coumadin.  Home health RN will draw blood     tests and the Coumadin dosing to be adjusted by the home health     pharmacy.  Her Lovenox can be discontinued once INR is greater than     or equal to 2. 2. Extraction of impacted tooth #32.  The oral surgeons were     consulted.  The patient has history of recurrent infection in that     tooth and this carried the risk of embolization to the hip     arthroplasty and hence this was extracted.  The patient will     complete a total weeks of antibiotic and will follow up with the     dental surgeons. 3. Mild acute blood loss anemia.  There is no other overt source of     bleeding.  This can be followed with daily CBC as an outpatient. 4. Alcohol intoxication/alcohol dependence.  The patient did not     demonstrate any features of alcohol  withdrawal.  She has been     counseled regarding cessation of alcohol.  She will be on     multivitamins. 5. Pernicious anemia or vitamin B12 deficiency.  She got a dose of     vitamin B12 1000 mcg IM yesterday in the hospital. 6. Polymyalgia rheumatica.  The patient was on higher doses of     hydrocortisone to cover for the stress.  She will continue on her     home dose of prednisone. 7. Hypertension.  Reasonably  controlled.  DISPOSITION:  The patient will be discharged home in stable condition.  FOLLOW-UP RECOMMENDATIONS: 1. Daily PT/INR and CBC through the home health RN, starting July 07, 2010.  These results are to be conveyed to Dr. Rickard Patience with     [place] [facility] for Coumadin dose adjustment.  Her Lovenox is     to be discontinued once her INR is greater than or equal to 2. 2. Follow up with Dr. Ocie Doyne in 1-2 weeks.  The patient or     family is to call for an appointment. 3. With Dr. Norlene Campbell, the patient has an appointment for July 17, 2010.  The family can confirm this as far as timing. 4. With Dr. Rickard Patience.  The patient is to call for an     appointment.  Time taken in coordinating this discharge is 45 minutes.     Marcellus Scott, MD     AH/MEDQ  D:  07/06/2010  T:  07/06/2010  Job:  [id number]  cc:   Georgia Lopes, M.D. Claude Manges. Cleophas Dunker, M.D. Rickard Patience, MD  Electronically Signed by Marcellus Scott MD on 07/24/2010 11:11:20 PM

## 2010-07-28 ENCOUNTER — Telehealth: Payer: Self-pay

## 2010-07-28 NOTE — Telephone Encounter (Signed)
Pt's husband Greggory Stallion) called and stated he had not given his wife any coumadin since Monday and wanted to know what Dr. Scotty Court wanted him to do.   Dr. Scotty Court is aware and stated he would call pt.  Per pt Dr. Scotty Court called and pt's INR will be taken today by Genevieve Norlander.

## 2010-08-01 ENCOUNTER — Ambulatory Visit: Payer: Medicare Other | Admitting: Family Medicine

## 2010-08-01 ENCOUNTER — Ambulatory Visit (INDEPENDENT_AMBULATORY_CARE_PROVIDER_SITE_OTHER): Payer: Medicare Other | Admitting: Internal Medicine

## 2010-08-01 DIAGNOSIS — D649 Anemia, unspecified: Secondary | ICD-10-CM

## 2010-08-01 MED ORDER — CYANOCOBALAMIN 1000 MCG/ML IJ SOLN
1000.0000 ug | Freq: Once | INTRAMUSCULAR | Status: AC
  Administered 2010-08-01: 1000 ug via INTRAMUSCULAR

## 2010-08-02 NOTE — Op Note (Signed)
[name], [name]           ACCOUNT NO.:  [id number]  MEDICAL RECORD NO.:  [id number]  LOCATION:  5005                         FACILITY:  [facility]  PHYSICIAN:  Claude Manges. Ninoshka Wainwright, M.D.DATE OF BIRTH:  August 12, 1928  DATE OF PROCEDURE:  07/02/2010 DATE OF DISCHARGE:                              OPERATIVE REPORT   PREOPERATIVE DIAGNOSIS:  Displaced subcapital fracture, right hip.  POSTOPERATIVE DIAGNOSIS:  Displaced subcapital fracture, right hip.  PROCEDURE:  Bipolar arthroplasty, right hip.  SURGEON:  Claude Manges. Cleophas Dunker, MD  ASSISTANT:  Oris Drone. Petrarca, PA-C  ANESTHESIA:  General.  COMPLICATIONS:  None.  COMPONENTS:  Summit size 3, basic Press-Fit femoral stem 135 mm in length.  A self-centering bipolar head 28 mm inner diameter and 46 mm outer diameter and a 28 mm plus 1.5 mm neck length metallic femoral head.  PROCEDURE:  [name] was met with the family in the holding area, identified the right hip as the appropriate operative site, marked.  The patient was then transported to room #5 and placed under general anesthesia without difficulty.  The patient was then placed in the lateral decubitus position with the right side up and secured to the operating table with the innominate hip system.  The right hip was then prepped with Betadine scrub and then DuraPrep from iliac crest below the knee.  Sterile draping was performed.  Skin incision was outlined using a southern approach along the greater trochanter extending posteriorly over the buttock over an area about 4-5 inches by sharp dissection.  Incision was carried down to the subcutaneous tissue.  Small bleeders were Bovie coagulated.  Self- retaining retractor was inserted.  The iliotibial band was identified and incised long length of the skin incision.  Retractors were placed more deeply.  Short external rotators were identified.  I tagged one of the tendons with 0 Ethibond suture.  The capsule was identified  and incised along the femoral neck and head.  Gross unclotted blood exuded from the hip joint.  Using a towel clip and then elevating the head, it was then dislocated from the acetabulum, it was detached in the subcapital area.  The head was then removed after debriding soft tissue. It measured 46 mm outer diameter.  The head was quite osteopenic and would easily crumble.  There were number of small fragments of bone within the joint, which were irrigated.  Retractor was then placed along the femoral neck.  I utilized the Summit basic femoral stem system.  This is a rasp only approached, the #1, 2 and then 3 rasps were carefully inserted and impacted without complication using the rasp was a template.  I osteotomized the neck along the appropriate calcar angle.  The wound was then irrigated with saline solution.  Using the #3 rasp which was an excellent fit in about 15 and 20 degrees of anteversion, it was left in placed and then the trial bipolar 46-mm outer diameter head was then inserted.  This was reduced through full range of motion, it was perfectly stable and felt like the leg lengths were reestablished.  The trial components were removed including the #3 broach.  The wound was carefully irrigated with saline solution.  Retractors were placed  about the hip for easy visualization.  I then carefully and slowly impacted the #3 basic Summit femoral component with the great surface. It fit nice flush on the calcar.  Wound was again irrigated with saline solution.  The 28 mm plus 1.5 mm metallic head was then inserted on the [facility] taper neck followed by the plastic insert and the metallic bipolar component 46 mm outer diameter.  Acetabulum was again inspected with no evidence of any loose material.  The head was then reduced.  Capsule was carefully held in place so that it would not evaginated to the joint. Through full range of motion, had excellent stability and I felt that the fit  was perfect.  There were no complications.  Wound was again irrigated with saline solution.  The capsule was closed anatomically with #1 Ethibond.  Short external rotators were closed with similar material.  The wound was again irrigated with saline solution. Iliotibial band was closed with a running #1 Vicryl, subcu in several layers with 2-0 Vicryl and 3-0 Monocryl.  Skin was closed with skin clips.  Sterile bulky dressing was applied.  The patient was then placed supine on the operating table, awakened and then transported to the operating room.  The patient tolerated the procedure well without complications.     Claude Manges. Cleophas Dunker, M.D.     PWW/MEDQ  D:  07/02/2010  T:  07/03/2010  Job:  [id number]  Electronically Signed by Norlene Campbell M.D. on 08/02/2010 11:41:29 AM

## 2010-08-09 ENCOUNTER — Ambulatory Visit (INDEPENDENT_AMBULATORY_CARE_PROVIDER_SITE_OTHER): Payer: Medicare Other | Admitting: Family Medicine

## 2010-08-09 VITALS — BP 128/80 | Temp 98.6°F | Wt 95.0 lb

## 2010-08-09 DIAGNOSIS — M353 Polymyalgia rheumatica: Secondary | ICD-10-CM

## 2010-08-09 DIAGNOSIS — F1020 Alcohol dependence, uncomplicated: Secondary | ICD-10-CM

## 2010-08-09 DIAGNOSIS — E538 Deficiency of other specified B group vitamins: Secondary | ICD-10-CM

## 2010-08-14 NOTE — H&P (Signed)
[name], [name]           ACCOUNT NO.:  [id number]  MEDICAL RECORD NO.:  [id number]  LOCATION:  5005                         FACILITY:  [facility]  PHYSICIAN:  Della Goo, M.D. DATE OF BIRTH:  07-Mar-1928  DATE OF ADMISSION:  07/02/2010 DATE OF DISCHARGE:                             HISTORY & PHYSICAL   CHIEF COMPLAINT:  Syncope, fall, and right hip pain.  HISTORY OF PRESENT ILLNESS:  This is an 77 year old female who complains of falling due to a syncopal episode in her home onto her right side. The patient suffered increased right hip pain and EMS was called.  The patient was brought to the emergency department and x-rays were performed revealing an acute right hip fracture.  PAST MEDICAL HISTORY:  Significant for polymyalgia rheumatica, pernicious anemia, hypertension, osteoarthritis.  The patient also has a current dental abscess on the lower left mandibular side.  Please note the patient has been on three courses of antibiotic therapy with clindamycin for the dental abscess.  MEDICATIONS:  At this time include alendronate, metoprolol, prednisone, fish oil, and vitamin D3.  PAST SURGICAL HISTORY:  History of an appendectomy and a left total hip replacement 5 years ago.  ALLERGIES:  ASPIRIN and CODEINE.  SOCIAL HISTORY:  The patient is a nonsmoker.  She does report drinking 2- 3 glasses of alcohol daily.  She drinks vodka and grapefruit juice, and she denies any illicit drug usage.  FAMILY HISTORY:  Positive for Alzheimer dementia in her mother and cancer in her father who had lung cancer and lymphoma.  REVIEW OF SYSTEMS:  Pertinents are mentioned above.  PHYSICAL EXAMINATION FINDINGS:  GENERAL:  This is an 77 year old elderly thin well-developed Caucasian female who is in discomfort, but in no acute distress. HEENT:  Normocephalic, atraumatic.  Pupils are equally round and reactive to light.  Extraocular movements are intact.  Funduscopic benign.  Nares  are patent bilaterally.  Oropharynx is clear. NECK:  Supple.  Full range of motion.  No thyromegaly, adenopathy, jugular venous distention. CARDIOVASCULAR:  Regular rate and rhythm. LUNGS:  Clear to auscultation bilaterally.  No rales, rhonchi, or wheezes. CHEST WALL:  Normal chest wall excursion and symmetric excursion. ABDOMEN:  Positive bowel sounds. EXTREMITIES:  Without cyanosis, clubbing, or edema. NEUROLOGIC:  Nonfocal.  ASSESSMENT:  An 77 year old female being admitted with: 1. Right hip fracture status post fall. 2. Syncope. 3. Alcohol intoxication. 4. Polymyalgia rheumatica. 5. Chronic steroid therapy. 6. Hypertension.  PLAN:  The patient will be admitted.  The Orthopedic Service will see the patient in the a.m.  Dr. Dwain Sarna is the consultant on-call.  Pain control therapy has been ordered, and the patient will be n.p.o. after midnight in the event that she has surgery in the a.m.  A cortisol level will be checked, and the patient will be placed on IV stress dose treatment.  The CIWA protocol will be initiated as well with IV Ativan secondary to question of the patient having long-term alcohol usage. Her level on admission was found to be 233.  A Foley catheter will be placed for now and SCDs have been ordered for DVT prophylaxis.  The patient is a full code at this time.  Della Goo, M.D.     HJ/MEDQ  D:  07/02/2010  T:  07/02/2010  Job:  [id number]  Electronically Signed by Della Goo M.D. on 08/14/2010 10:59:57 AM

## 2010-08-29 ENCOUNTER — Ambulatory Visit (INDEPENDENT_AMBULATORY_CARE_PROVIDER_SITE_OTHER): Payer: Medicare Other | Admitting: Family Medicine

## 2010-08-29 DIAGNOSIS — E538 Deficiency of other specified B group vitamins: Secondary | ICD-10-CM

## 2010-08-29 MED ORDER — CYANOCOBALAMIN 1000 MCG/ML IJ SOLN
1000.0000 ug | Freq: Once | INTRAMUSCULAR | Status: AC
  Administered 2010-08-29: 1000 ug via INTRAMUSCULAR

## 2010-09-19 ENCOUNTER — Other Ambulatory Visit: Payer: Self-pay | Admitting: Family Medicine

## 2010-09-25 ENCOUNTER — Encounter: Payer: Self-pay | Admitting: Family Medicine

## 2010-09-25 NOTE — Patient Instructions (Signed)
Recommend said she had the episode of passing out and falling from intoxication with high blood alcohol recommend you abstain from any alcohol intake continue your other medications

## 2010-09-25 NOTE — Progress Notes (Signed)
  Subjective:    Patient ID: [name], female    DOB: 10/14/1928, 77 y.o.   MRN: [id number] This 77 year old white female with multiple problems arthritic vitamin B 12 deficiency anemia and a problem of alcohol intake to the point that she had an episode of passing out with injury over help taken to the emergency room findable blood-alcohol 10 and is in to discuss future treatment. We spent some time in discussing this problem resulted in an abstinence the patient's part  HPI    Review of Systems see history of present illness     Objective:   Physical Exam the patient is a thin white female in no distress and is denying as to the mount of alcohol she imbibes Of note positive findings on physical relating to this problem        Assessment & Plan:  Alcoholism to the point of having nightly Drakes and imbibe in her greater amount of alcohol and the patient is real I lying injury blood-alcohol 10 it after long discussion patient agrees to abstain from alcohol intake and if she needs any medication to help as far as anxiety or withdrawal symptoms I am to be notified of

## 2010-09-26 ENCOUNTER — Ambulatory Visit (INDEPENDENT_AMBULATORY_CARE_PROVIDER_SITE_OTHER): Payer: Medicare Other

## 2010-09-26 ENCOUNTER — Ambulatory Visit: Payer: Medicare Other

## 2010-09-26 DIAGNOSIS — E538 Deficiency of other specified B group vitamins: Secondary | ICD-10-CM

## 2010-09-26 MED ORDER — CYANOCOBALAMIN 1000 MCG/ML IJ SOLN
1000.0000 ug | Freq: Once | INTRAMUSCULAR | Status: AC
  Administered 2010-09-26: 1000 ug via INTRAMUSCULAR

## 2010-09-29 ENCOUNTER — Ambulatory Visit: Payer: Medicare Other

## 2010-10-24 ENCOUNTER — Ambulatory Visit (INDEPENDENT_AMBULATORY_CARE_PROVIDER_SITE_OTHER): Payer: Medicare Other | Admitting: Family Medicine

## 2010-10-24 DIAGNOSIS — Z23 Encounter for immunization: Secondary | ICD-10-CM

## 2010-10-24 DIAGNOSIS — E538 Deficiency of other specified B group vitamins: Secondary | ICD-10-CM

## 2010-10-24 MED ORDER — CYANOCOBALAMIN 1000 MCG/ML IJ SOLN
1000.0000 ug | Freq: Once | INTRAMUSCULAR | Status: AC
  Administered 2010-10-24: 1000 ug via INTRAMUSCULAR

## 2010-11-21 ENCOUNTER — Ambulatory Visit (INDEPENDENT_AMBULATORY_CARE_PROVIDER_SITE_OTHER): Payer: Medicare Other

## 2010-11-21 ENCOUNTER — Ambulatory Visit: Payer: Medicare Other | Admitting: Family Medicine

## 2010-11-21 DIAGNOSIS — E538 Deficiency of other specified B group vitamins: Secondary | ICD-10-CM

## 2010-11-21 DIAGNOSIS — D510 Vitamin B12 deficiency anemia due to intrinsic factor deficiency: Secondary | ICD-10-CM

## 2010-11-21 MED ORDER — CYANOCOBALAMIN 1000 MCG/ML IJ SOLN
1000.0000 ug | Freq: Once | INTRAMUSCULAR | Status: AC
  Administered 2010-11-21: 1000 ug via INTRAMUSCULAR

## 2010-12-19 ENCOUNTER — Ambulatory Visit (INDEPENDENT_AMBULATORY_CARE_PROVIDER_SITE_OTHER): Payer: Medicare Other

## 2010-12-19 DIAGNOSIS — E538 Deficiency of other specified B group vitamins: Secondary | ICD-10-CM

## 2010-12-19 MED ORDER — CYANOCOBALAMIN 1000 MCG/ML IJ SOLN
1000.0000 ug | Freq: Once | INTRAMUSCULAR | Status: AC
  Administered 2010-12-19: 1000 ug via INTRAMUSCULAR

## 2011-01-18 ENCOUNTER — Ambulatory Visit: Payer: Medicare Other

## 2011-01-18 ENCOUNTER — Ambulatory Visit (INDEPENDENT_AMBULATORY_CARE_PROVIDER_SITE_OTHER): Payer: Medicare Other

## 2011-01-18 DIAGNOSIS — D510 Vitamin B12 deficiency anemia due to intrinsic factor deficiency: Secondary | ICD-10-CM

## 2011-01-18 MED ORDER — CYANOCOBALAMIN 1000 MCG/ML IJ SOLN
1000.0000 ug | Freq: Once | INTRAMUSCULAR | Status: AC
  Administered 2011-01-18: 1000 ug via INTRAMUSCULAR

## 2011-02-20 ENCOUNTER — Ambulatory Visit (INDEPENDENT_AMBULATORY_CARE_PROVIDER_SITE_OTHER): Payer: Medicare Other | Admitting: Family Medicine

## 2011-02-20 DIAGNOSIS — D510 Vitamin B12 deficiency anemia due to intrinsic factor deficiency: Secondary | ICD-10-CM

## 2011-02-21 DIAGNOSIS — D510 Vitamin B12 deficiency anemia due to intrinsic factor deficiency: Secondary | ICD-10-CM | POA: Diagnosis not present

## 2011-02-21 MED ORDER — CYANOCOBALAMIN 1000 MCG/ML IJ SOLN
1000.0000 ug | Freq: Once | INTRAMUSCULAR | Status: AC
  Administered 2011-02-21: 1000 ug via INTRAMUSCULAR

## 2011-03-06 ENCOUNTER — Encounter: Payer: Medicare Other | Admitting: Family Medicine

## 2011-03-20 ENCOUNTER — Ambulatory Visit (INDEPENDENT_AMBULATORY_CARE_PROVIDER_SITE_OTHER): Payer: Medicare Other | Admitting: Family Medicine

## 2011-03-20 DIAGNOSIS — D510 Vitamin B12 deficiency anemia due to intrinsic factor deficiency: Secondary | ICD-10-CM

## 2011-03-20 MED ORDER — CYANOCOBALAMIN 1000 MCG/ML IJ SOLN
1000.0000 ug | Freq: Once | INTRAMUSCULAR | Status: AC
  Administered 2011-03-20: 1000 ug via INTRAMUSCULAR

## 2011-03-27 ENCOUNTER — Ambulatory Visit (INDEPENDENT_AMBULATORY_CARE_PROVIDER_SITE_OTHER): Payer: Medicare Other | Admitting: Family Medicine

## 2011-03-27 ENCOUNTER — Encounter: Payer: Self-pay | Admitting: Family Medicine

## 2011-03-27 DIAGNOSIS — Z0000 Encounter for general adult medical examination without abnormal findings: Secondary | ICD-10-CM

## 2011-03-27 DIAGNOSIS — I10 Essential (primary) hypertension: Secondary | ICD-10-CM | POA: Diagnosis not present

## 2011-03-27 DIAGNOSIS — J438 Other emphysema: Secondary | ICD-10-CM | POA: Diagnosis not present

## 2011-03-27 DIAGNOSIS — E559 Vitamin D deficiency, unspecified: Secondary | ICD-10-CM

## 2011-03-27 DIAGNOSIS — M810 Age-related osteoporosis without current pathological fracture: Secondary | ICD-10-CM | POA: Diagnosis not present

## 2011-03-27 DIAGNOSIS — D510 Vitamin B12 deficiency anemia due to intrinsic factor deficiency: Secondary | ICD-10-CM | POA: Diagnosis not present

## 2011-03-27 DIAGNOSIS — M353 Polymyalgia rheumatica: Secondary | ICD-10-CM

## 2011-03-27 LAB — BASIC METABOLIC PANEL
BUN: 15 mg/dL (ref 6–23)
CO2: 26 mEq/L (ref 19–32)
Calcium: 9.2 mg/dL (ref 8.4–10.5)
Creatinine, Ser: 1 mg/dL (ref 0.4–1.2)
GFR: 55.29 mL/min — ABNORMAL LOW (ref >60.00)
Glucose, Bld: 100 mg/dL — ABNORMAL HIGH (ref 70–99)
Sodium: 136 mEq/L (ref 135–145)

## 2011-03-27 LAB — CBC WITH DIFFERENTIAL/PLATELET
Basophils Absolute: 0 10*3/uL (ref 0.0–0.1)
Eosinophils Absolute: 0.1 10*3/uL (ref 0.0–0.7)
Lymphocytes Relative: 15 % (ref 12.0–46.0)
MCHC: 33.2 g/dL (ref 30.0–36.0)
MCV: 96 fl (ref 78.0–100.0)
Monocytes Absolute: 0.7 10*3/uL (ref 0.1–1.0)
Neutro Abs: 9 10*3/uL — ABNORMAL HIGH (ref 1.4–7.7)
Neutrophils Relative %: 78.3 % — ABNORMAL HIGH (ref 43.0–77.0)
RDW: 14 % (ref 11.5–14.6)

## 2011-03-27 LAB — POCT URINALYSIS DIPSTICK
Blood, UA: NEGATIVE
Glucose, UA: NEGATIVE
Nitrite, UA: NEGATIVE
Protein, UA: NEGATIVE
Urobilinogen, UA: 0.2

## 2011-03-27 MED ORDER — CYANOCOBALAMIN 1000 MCG/ML IJ SOLN: 1000.0000 ug | INTRAMUSCULAR | Status: DC

## 2011-03-27 MED ORDER — "INSULIN SYRINGE-NEEDLE U-100 25G X 5/8"" 1 ML MISC": 1.0000 | Status: DC

## 2011-03-27 MED ORDER — METOPROLOL TARTRATE 50 MG PO TABS: 25.0000 mg | ORAL_TABLET | ORAL | Status: DC

## 2011-03-27 NOTE — Progress Notes (Signed)
  Subjective:    Patient ID: [name], female    DOB: 07/05/1928, 78 y.o.   MRN: [id number]  HPI [name] is a 78 year old female married ex-smoker who comes in today for a Medicare wellness examination having transferred from Dr. Scotty Court  She sees Dr. Harriet Masson low on a regular basis for osteoporosis secondary to multiple issues including tobacco abuse. She's been on Fosamax for a long time she does remember how long.  She comes in monthly for vitamin B12 shot we'll give these at home for now on  She takes Lopressor 50 mg the morning 25 anemia for hypertension BP too low 124/76  Dr. Launa Flight has run 20 mg of prednisone daily for PMR  She gets routine eye care, hearing normal, no regular dental care, no longer requires colonoscopy or mammography. Cognitive function normal she does not walk or exercise on a daily basis home health safety reviewed no issues identified, no guns in the house, she does have a health care power of attorney and living well, flu shot 2012 Pneumovax x2 tetanus 2009 information given on shingles   Review of Systems  Constitutional: Negative.   HENT: Negative.   Eyes: Negative.   Respiratory: Negative.   Cardiovascular: Negative.   Gastrointestinal: Negative.   Genitourinary: Negative.   Musculoskeletal: Negative.   Neurological: Negative.   Hematological: Negative.   Psychiatric/Behavioral: Negative.        Objective:   Physical Exam  Constitutional: She appears well-developed and well-nourished.  HENT:  Head: Normocephalic and atraumatic.  Right Ear: External ear normal.  Left Ear: External ear normal.  Nose: Nose normal.  Mouth/Throat: Oropharynx is clear and moist.  Eyes: EOM are normal. Pupils are equal, round, and reactive to light.  Neck: Normal range of motion. Neck supple. No thyromegaly present.  Cardiovascular: Normal rate, regular rhythm, normal heart sounds and intact distal pulses.  Exam reveals no gallop and no friction rub.   No  murmur heard.      Split S1  Pulmonary/Chest: Effort normal.       Even with deep inspiration barely audible breath sounds  Abdominal: Soft. Bowel sounds are normal. She exhibits no distension and no mass. There is no tenderness. There is no rebound.  Genitourinary:       Bilateral breast exam normal  Musculoskeletal: Normal range of motion.  Lymphadenopathy:    She has no cervical adenopathy.  Neurological: She is alert. She has normal reflexes. No cranial nerve deficit. She exhibits normal muscle tone. Coordination normal.  Skin: Skin is warm and dry.  Psychiatric: She has a normal mood and affect. Her behavior is normal. Judgment and thought content normal.          Assessment & Plan:  Hypertension decrease Lopressor 25 mg daily  B12 deficiency change and do the B12 shots at home  PMR and osteoporosis followed by Dr. Launa Flight return one year sooner if any problems

## 2011-03-27 NOTE — Patient Instructions (Signed)
Vitamin B12 injections 1 cc monthly at home  Decrease the Lopressor to 25 mg once daily in the morning  Return in one year for general medical exam sooner if any problems

## 2011-04-06 ENCOUNTER — Telehealth: Payer: Self-pay | Admitting: Family Medicine

## 2011-04-06 DIAGNOSIS — I10 Essential (primary) hypertension: Secondary | ICD-10-CM

## 2011-04-06 DIAGNOSIS — D510 Vitamin B12 deficiency anemia due to intrinsic factor deficiency: Secondary | ICD-10-CM

## 2011-04-06 MED ORDER — CYANOCOBALAMIN 1000 MCG/ML IJ SOLN: 1000.0000 ug | INTRAMUSCULAR | Status: DC

## 2011-04-06 MED ORDER — "INSULIN SYRINGE-NEEDLE U-100 25G X 5/8"" 1 ML MISC": 1.0000 | Status: DC

## 2011-04-06 NOTE — Telephone Encounter (Signed)
Need rx sent in for B12 injections

## 2011-04-09 ENCOUNTER — Other Ambulatory Visit: Payer: Self-pay | Admitting: *Deleted

## 2011-04-09 DIAGNOSIS — I10 Essential (primary) hypertension: Secondary | ICD-10-CM

## 2011-04-09 DIAGNOSIS — D510 Vitamin B12 deficiency anemia due to intrinsic factor deficiency: Secondary | ICD-10-CM

## 2011-04-09 MED ORDER — "INSULIN SYRINGE-NEEDLE U-100 25G X 5/8"" 1 ML MISC": 1.0000 | Status: DC

## 2011-04-09 MED ORDER — CYANOCOBALAMIN 1000 MCG/ML IJ SOLN: 1000.0000 ug | INTRAMUSCULAR | Status: DC

## 2011-04-17 ENCOUNTER — Ambulatory Visit (INDEPENDENT_AMBULATORY_CARE_PROVIDER_SITE_OTHER): Payer: Medicare Other | Admitting: Family Medicine

## 2011-04-17 DIAGNOSIS — D510 Vitamin B12 deficiency anemia due to intrinsic factor deficiency: Secondary | ICD-10-CM | POA: Diagnosis not present

## 2011-04-17 MED ORDER — CYANOCOBALAMIN 1000 MCG/ML IJ SOLN
1000.0000 ug | Freq: Once | INTRAMUSCULAR | Status: AC
  Administered 2011-04-17: 1000 ug via INTRAMUSCULAR

## 2011-05-15 ENCOUNTER — Ambulatory Visit (INDEPENDENT_AMBULATORY_CARE_PROVIDER_SITE_OTHER): Payer: Medicare Other | Admitting: *Deleted

## 2011-05-15 ENCOUNTER — Ambulatory Visit: Payer: Medicare Other | Admitting: Family Medicine

## 2011-05-15 DIAGNOSIS — E538 Deficiency of other specified B group vitamins: Secondary | ICD-10-CM

## 2011-05-15 MED ORDER — CYANOCOBALAMIN 1000 MCG/ML IJ SOLN
1000.0000 ug | Freq: Once | INTRAMUSCULAR | Status: AC
  Administered 2011-05-15: 1000 ug via INTRAMUSCULAR

## 2011-05-29 DIAGNOSIS — M899 Disorder of bone, unspecified: Secondary | ICD-10-CM | POA: Diagnosis not present

## 2011-05-29 DIAGNOSIS — M25559 Pain in unspecified hip: Secondary | ICD-10-CM | POA: Diagnosis not present

## 2011-05-29 DIAGNOSIS — M353 Polymyalgia rheumatica: Secondary | ICD-10-CM | POA: Diagnosis not present

## 2011-05-29 DIAGNOSIS — M949 Disorder of cartilage, unspecified: Secondary | ICD-10-CM | POA: Diagnosis not present

## 2011-05-29 DIAGNOSIS — Z79899 Other long term (current) drug therapy: Secondary | ICD-10-CM | POA: Diagnosis not present

## 2011-06-12 ENCOUNTER — Ambulatory Visit (INDEPENDENT_AMBULATORY_CARE_PROVIDER_SITE_OTHER): Payer: Medicare Other | Admitting: Family Medicine

## 2011-06-12 DIAGNOSIS — D510 Vitamin B12 deficiency anemia due to intrinsic factor deficiency: Secondary | ICD-10-CM | POA: Diagnosis not present

## 2011-06-12 DIAGNOSIS — I10 Essential (primary) hypertension: Secondary | ICD-10-CM

## 2011-06-12 MED ORDER — "INSULIN SYRINGE-NEEDLE U-100 25G X 5/8"" 1 ML MISC": 1.0000 | Status: DC

## 2011-06-12 MED ORDER — CYANOCOBALAMIN 1000 MCG/ML IJ SOLN
1000.0000 ug | Freq: Once | INTRAMUSCULAR | Status: AC
  Administered 2011-06-12: 1000 ug via INTRAMUSCULAR

## 2011-06-12 MED ORDER — CYANOCOBALAMIN 1000 MCG/ML IJ SOLN: 1000.0000 ug | INTRAMUSCULAR | Status: DC

## 2011-07-10 ENCOUNTER — Ambulatory Visit (INDEPENDENT_AMBULATORY_CARE_PROVIDER_SITE_OTHER): Payer: Medicare Other | Admitting: Family Medicine

## 2011-07-10 DIAGNOSIS — D510 Vitamin B12 deficiency anemia due to intrinsic factor deficiency: Secondary | ICD-10-CM | POA: Diagnosis not present

## 2011-07-10 MED ORDER — CYANOCOBALAMIN 1000 MCG/ML IJ SOLN
1000.0000 ug | Freq: Once | INTRAMUSCULAR | Status: AC
  Administered 2011-07-10: 1000 ug via INTRAMUSCULAR

## 2011-08-07 ENCOUNTER — Ambulatory Visit (INDEPENDENT_AMBULATORY_CARE_PROVIDER_SITE_OTHER): Payer: Medicare Other | Admitting: Family Medicine

## 2011-08-07 DIAGNOSIS — D510 Vitamin B12 deficiency anemia due to intrinsic factor deficiency: Secondary | ICD-10-CM | POA: Diagnosis not present

## 2011-08-07 MED ORDER — CYANOCOBALAMIN 1000 MCG/ML IJ SOLN
1000.0000 ug | Freq: Once | INTRAMUSCULAR | Status: AC
  Administered 2011-08-07: 1000 ug via INTRAMUSCULAR

## 2011-09-04 ENCOUNTER — Ambulatory Visit (INDEPENDENT_AMBULATORY_CARE_PROVIDER_SITE_OTHER): Payer: Medicare Other | Admitting: Family Medicine

## 2011-09-04 DIAGNOSIS — I10 Essential (primary) hypertension: Secondary | ICD-10-CM

## 2011-09-04 DIAGNOSIS — E559 Vitamin D deficiency, unspecified: Secondary | ICD-10-CM | POA: Diagnosis not present

## 2011-09-04 DIAGNOSIS — D510 Vitamin B12 deficiency anemia due to intrinsic factor deficiency: Secondary | ICD-10-CM | POA: Diagnosis not present

## 2011-09-04 MED ORDER — "INSULIN SYRINGE-NEEDLE U-100 25G X 5/8"" 1 ML MISC": 1.0000 | Status: DC

## 2011-09-04 MED ORDER — CYANOCOBALAMIN 1000 MCG/ML IJ SOLN: 1000.0000 ug | INTRAMUSCULAR | Status: DC

## 2011-09-04 MED ORDER — CYANOCOBALAMIN 1000 MCG/ML IJ SOLN
1000.0000 ug | Freq: Once | INTRAMUSCULAR | Status: AC
  Administered 2011-09-04: 1000 ug via INTRAMUSCULAR

## 2011-10-01 ENCOUNTER — Telehealth: Payer: Self-pay | Admitting: Family Medicine

## 2011-10-01 NOTE — Telephone Encounter (Signed)
Pt called and said that she has a question re: when she needs to give her next b12 inj? Pt has to different instructions, 1 to give every 28days and the other to give once a month? Ok to leave message with spouse if pt isn't avail.

## 2011-10-02 NOTE — Telephone Encounter (Signed)
Called patient to make aware that her b12 injections are once a month.  Pt was instructed to come back into the office to practice giving b12 injection. Pt will come in on Wednesday.

## 2011-10-04 ENCOUNTER — Ambulatory Visit (INDEPENDENT_AMBULATORY_CARE_PROVIDER_SITE_OTHER): Payer: Medicare Other

## 2011-10-04 DIAGNOSIS — Z23 Encounter for immunization: Secondary | ICD-10-CM

## 2011-10-29 DIAGNOSIS — M109 Gout, unspecified: Secondary | ICD-10-CM | POA: Diagnosis not present

## 2011-10-29 DIAGNOSIS — L03119 Cellulitis of unspecified part of limb: Secondary | ICD-10-CM | POA: Diagnosis not present

## 2011-10-29 DIAGNOSIS — M79609 Pain in unspecified limb: Secondary | ICD-10-CM | POA: Diagnosis not present

## 2011-10-29 DIAGNOSIS — M7150 Other bursitis, not elsewhere classified, unspecified site: Secondary | ICD-10-CM | POA: Diagnosis not present

## 2011-10-29 DIAGNOSIS — L02619 Cutaneous abscess of unspecified foot: Secondary | ICD-10-CM | POA: Diagnosis not present

## 2011-11-19 DIAGNOSIS — M109 Gout, unspecified: Secondary | ICD-10-CM | POA: Diagnosis not present

## 2011-11-20 DIAGNOSIS — M25579 Pain in unspecified ankle and joints of unspecified foot: Secondary | ICD-10-CM | POA: Diagnosis not present

## 2011-11-20 DIAGNOSIS — M109 Gout, unspecified: Secondary | ICD-10-CM | POA: Diagnosis not present

## 2011-11-20 DIAGNOSIS — M353 Polymyalgia rheumatica: Secondary | ICD-10-CM | POA: Diagnosis not present

## 2011-12-13 DIAGNOSIS — H3530 Unspecified macular degeneration: Secondary | ICD-10-CM | POA: Diagnosis not present

## 2011-12-13 DIAGNOSIS — H35379 Puckering of macula, unspecified eye: Secondary | ICD-10-CM | POA: Diagnosis not present

## 2011-12-18 DIAGNOSIS — I10 Essential (primary) hypertension: Secondary | ICD-10-CM | POA: Diagnosis not present

## 2011-12-18 DIAGNOSIS — M353 Polymyalgia rheumatica: Secondary | ICD-10-CM | POA: Diagnosis not present

## 2011-12-18 DIAGNOSIS — Z79899 Other long term (current) drug therapy: Secondary | ICD-10-CM | POA: Diagnosis not present

## 2011-12-18 DIAGNOSIS — M109 Gout, unspecified: Secondary | ICD-10-CM | POA: Diagnosis not present

## 2012-04-02 DIAGNOSIS — M109 Gout, unspecified: Secondary | ICD-10-CM | POA: Diagnosis not present

## 2012-04-02 DIAGNOSIS — M353 Polymyalgia rheumatica: Secondary | ICD-10-CM | POA: Diagnosis not present

## 2012-04-30 DIAGNOSIS — Z79899 Other long term (current) drug therapy: Secondary | ICD-10-CM | POA: Diagnosis not present

## 2012-04-30 DIAGNOSIS — M109 Gout, unspecified: Secondary | ICD-10-CM | POA: Diagnosis not present

## 2012-05-06 ENCOUNTER — Telehealth: Payer: Self-pay | Admitting: Family Medicine

## 2012-05-06 NOTE — Telephone Encounter (Signed)
Patient Information:  Caller Name: [name]  Phone: [phone]  Patient: [name]  Gender: Female  DOB: 26-Jul-1928  Age: 79 Years  PCP: Kelle Darting [facility])  Office Follow Up:  Does the office need to follow up with this patient?: Yes  Instructions For The Office: Please call patient regarding possible work in appointment regarding her blood pressure.  She has appt. with Rheumatologist on 4/21 and was to discuss with PCP prior to that appointment.  Thank you.   Symptoms  Reason For Call & Symptoms: Patient states Dr. Kellie Simmering / Rheumatologist told her her BP is out of range -since last fall; and to follow up with PCP.  Denies any current symptoms.  See Today in Office per High Blood Pressure guideline.  No appointments available with Dr. Tawanna Cooler at this time.  Reviewed Health History In EMR: Yes  Reviewed Medications In EMR: Yes  Reviewed Allergies In EMR: Yes  Reviewed Surgeries / Procedures: Yes  Date of Onset of Symptoms: Unknown  Treatments Tried: Metoprolol  Treatments Tried Worked: No  Guideline(s) Used:  High Blood Pressure  Disposition Per Guideline:   See Today in Office  Reason For Disposition Reached:   Patient wants to be seen  Advice Given:  N/A  RN Overrode Recommendation:  Document Patient  No appointments available with Dr. Tawanna Cooler 4/15 or 4/16.  Note to office for follow up.

## 2012-05-07 ENCOUNTER — Encounter: Payer: Self-pay | Admitting: Family Medicine

## 2012-05-07 ENCOUNTER — Ambulatory Visit (INDEPENDENT_AMBULATORY_CARE_PROVIDER_SITE_OTHER): Payer: Medicare Other | Admitting: Family Medicine

## 2012-05-07 VITALS — BP 200/80 | HR 60 | Temp 97.7°F | Wt 114.0 lb

## 2012-05-07 DIAGNOSIS — I10 Essential (primary) hypertension: Secondary | ICD-10-CM | POA: Diagnosis not present

## 2012-05-07 MED ORDER — AMLODIPINE BESYLATE 5 MG PO TABS: 5.0000 mg | ORAL_TABLET | Freq: Every day | ORAL | Status: DC

## 2012-05-07 NOTE — Patient Instructions (Signed)
Continue the metoprolol,,,,,,,, one tablet daily in the morning  Add Norvasc 5 mg,,,,,,,,,, one tablet daily in the morning  Purchase a digital pump up blood pressure cuff...........omron........ Seems to be the most accurate device....... You can purchase this on [employer]  Check your blood pressure daily in the morning  Return in 4 weeks for followup with your blood pressure readings and the device

## 2012-05-07 NOTE — Telephone Encounter (Signed)
Scheduled

## 2012-05-07 NOTE — Progress Notes (Signed)
  Subjective:    Patient ID: [name], female    DOB: 05/16/1928, 79 y.o.   MRN: [id number]  HPI [name] is an 79 year old female who comes in today for evaluation of hypertension  She has a long-standing history of hypertension and takes Lopressor 50 mg daily. Other past 6 months she's been told by her rheumatologist Dr. Harriet Masson low that her blood pressures out of control and she should come and see [place]. She's not come in until today. She states she has no symptoms feels fine   Review of Systems Review of systems negative    Objective:   Physical Exam Well-developed and nourished thin female no acute distress BP right arm sitting position 200/80 pulse 60 and regular       Assessment & Plan:

## 2012-05-07 NOTE — Telephone Encounter (Signed)
Please schedule patient today at 11:30. Patient is aware

## 2012-05-08 ENCOUNTER — Telehealth: Payer: Self-pay | Admitting: Family Medicine

## 2012-05-08 NOTE — Telephone Encounter (Signed)
MD requested pt buy a BP cuff and order on [employer]. Daughter states 20 pages of cuffs. Would like for you to call and advise.Thanks.

## 2012-05-08 NOTE — Telephone Encounter (Signed)
Spoke with daughter

## 2012-05-12 DIAGNOSIS — M109 Gout, unspecified: Secondary | ICD-10-CM | POA: Diagnosis not present

## 2012-05-12 DIAGNOSIS — M353 Polymyalgia rheumatica: Secondary | ICD-10-CM | POA: Diagnosis not present

## 2012-05-12 DIAGNOSIS — Z79899 Other long term (current) drug therapy: Secondary | ICD-10-CM | POA: Diagnosis not present

## 2012-05-13 ENCOUNTER — Telehealth: Payer: Self-pay | Admitting: Family Medicine

## 2012-05-13 NOTE — Telephone Encounter (Signed)
Fannie Knee (daughter of pt)  would like Fleet Contras to call her back to follow up on some of the things they discussed last week.

## 2012-05-13 NOTE — Telephone Encounter (Signed)
Will hold for rachel

## 2012-05-14 NOTE — Telephone Encounter (Signed)
Spoke with daughter

## 2012-06-04 ENCOUNTER — Ambulatory Visit (INDEPENDENT_AMBULATORY_CARE_PROVIDER_SITE_OTHER): Payer: Medicare Other | Admitting: Family Medicine

## 2012-06-04 ENCOUNTER — Encounter: Payer: Self-pay | Admitting: Family Medicine

## 2012-06-04 VITALS — BP 100/64 | Temp 98.0°F | Wt 109.0 lb

## 2012-06-04 DIAGNOSIS — H9190 Unspecified hearing loss, unspecified ear: Secondary | ICD-10-CM | POA: Diagnosis not present

## 2012-06-04 DIAGNOSIS — H9193 Unspecified hearing loss, bilateral: Secondary | ICD-10-CM

## 2012-06-04 DIAGNOSIS — R631 Polydipsia: Secondary | ICD-10-CM

## 2012-06-04 DIAGNOSIS — R5383 Other fatigue: Secondary | ICD-10-CM

## 2012-06-04 DIAGNOSIS — R5381 Other malaise: Secondary | ICD-10-CM

## 2012-06-04 DIAGNOSIS — I10 Essential (primary) hypertension: Secondary | ICD-10-CM | POA: Diagnosis not present

## 2012-06-04 LAB — GLUCOSE, POCT (MANUAL RESULT ENTRY): POC Glucose: 104 mg/dl — AB (ref 70–99)

## 2012-06-04 MED ORDER — METOPROLOL TARTRATE 50 MG PO TABS: ORAL_TABLET | ORAL | Status: DC

## 2012-06-04 NOTE — Patient Instructions (Addendum)
Continue the Norvasc 5 mg and Lopressor 50 mg,,,,,,,,, one of each daily in the morning  No alcohol.  I would recommend that you go to [facility],,,,,,,,,, for a hearing evaluation  2 Tylenol 3 times daily and clear liquids until the diarrhea stops

## 2012-06-04 NOTE — Progress Notes (Signed)
  Subjective:    Patient ID: [name], female    DOB: [birth year], 79 y.o.   MRN: [id number]  HPI Zamariyah is a 79 year old married female X. Smoker with a history of underlying COPD and alcoholism,,,,,,,,,, she states she only drinks an occasional glass of wine not even daily....... However Dr. Charmian Muff notes indicate that she is an alcoholic. The 2 daughters that accompanied her today we'll check the house  She's currently on Norvasc 5 mg daily and Lopressor 50 mg daily for blood pressure control BP right arm sitting position 130/80 pulse 70 and regular  For the past 3 days she hasn't felt well. She states she's been nauseated had diarrhea sweats and fever. Afebrile today  She is extremely hard of hearing  She's also on a number of medications from Dr. Noland Fordyce. She states she has gout. She's on allopurinol colchicine and prednisone when necessary. She's also on Fosamax. She's also on B12 one shot monthly for B12 deficiency  Blood sugar and hemoglobin normal 104 and 14.9 today   Review of Systems    review of systems otherwise negative Objective:   Physical Exam Well-developed thin female no acute distress BP right arm sitting position 130/80       Assessment & Plan:  Hypertension at goal continue current medications  Viral gastroenteritis,,,,,,,, 2 Tylenol 3 times that daily when necessary, clear liquids, return when necessary  Profound hearing loss,,,,,,,,, audiology consult  History of alcoholism,,,,,,,, the daughters will check the house

## 2012-06-11 ENCOUNTER — Telehealth: Payer: Self-pay | Admitting: Family Medicine

## 2012-06-11 NOTE — Telephone Encounter (Signed)
Pt would like to switch to Dr Selena Batten from Dr Tawanna Cooler, is that ok with you Dr Tawanna Cooler? Is that OK w/ you Dr Selena Batten?

## 2012-06-11 NOTE — Telephone Encounter (Signed)
Ok with me if ok with Dr. Tawanna Cooler. Needs new patient medicare appt and should see her current doctor for any acute issues in the meantime.

## 2012-06-20 NOTE — Telephone Encounter (Signed)
Okay with Dr Todd 

## 2012-07-01 DIAGNOSIS — Z79899 Other long term (current) drug therapy: Secondary | ICD-10-CM | POA: Diagnosis not present

## 2012-07-01 DIAGNOSIS — M109 Gout, unspecified: Secondary | ICD-10-CM | POA: Diagnosis not present

## 2012-07-01 DIAGNOSIS — M353 Polymyalgia rheumatica: Secondary | ICD-10-CM | POA: Diagnosis not present

## 2012-07-01 DIAGNOSIS — B9789 Other viral agents as the cause of diseases classified elsewhere: Secondary | ICD-10-CM | POA: Diagnosis not present

## 2012-07-22 ENCOUNTER — Encounter (HOSPITAL_COMMUNITY): Payer: Self-pay | Admitting: Pharmacy Technician

## 2012-07-31 ENCOUNTER — Encounter (HOSPITAL_COMMUNITY): Payer: Self-pay

## 2012-07-31 ENCOUNTER — Encounter (HOSPITAL_COMMUNITY)
Admission: RE | Admit: 2012-07-31 | Discharge: 2012-07-31 | Disposition: A | Discharge status: Home / Self Care | Payer: Medicare Other | Source: Ambulatory Visit | Attending: Oral Surgery | Admitting: Oral Surgery

## 2012-07-31 ENCOUNTER — Ambulatory Visit (HOSPITAL_COMMUNITY)
Admission: RE | Admit: 2012-07-31 | Discharge: 2012-07-31 | Disposition: A | Discharge status: Home / Self Care | Payer: Medicare Other | Source: Ambulatory Visit | Attending: Oral Surgery | Admitting: Oral Surgery

## 2012-07-31 DIAGNOSIS — I10 Essential (primary) hypertension: Secondary | ICD-10-CM | POA: Insufficient documentation

## 2012-07-31 DIAGNOSIS — Z01818 Encounter for other preprocedural examination: Secondary | ICD-10-CM | POA: Insufficient documentation

## 2012-07-31 DIAGNOSIS — Z01810 Encounter for preprocedural cardiovascular examination: Secondary | ICD-10-CM | POA: Diagnosis not present

## 2012-07-31 DIAGNOSIS — Z01812 Encounter for preprocedural laboratory examination: Secondary | ICD-10-CM | POA: Diagnosis not present

## 2012-07-31 LAB — COMPREHENSIVE METABOLIC PANEL
Albumin: 3.8 g/dL (ref 3.5–5.2)
BUN: 17 mg/dL (ref 6–23)
CO2: 30 mEq/L (ref 19–32)
Calcium: 10.3 mg/dL (ref 8.4–10.5)
Chloride: 103 mEq/L (ref 96–112)
Creatinine, Ser: 0.94 mg/dL (ref 0.50–1.10)
GFR calc non Af Amer: 55 mL/min — ABNORMAL LOW (ref >90)
Total Bilirubin: 0.3 mg/dL (ref 0.3–1.2)

## 2012-07-31 LAB — CBC
Hemoglobin: 14 g/dL (ref 12.0–15.0)
Platelets: 262 10*3/uL (ref 150–400)
RBC: 4.27 MIL/uL (ref 3.87–5.11)
WBC: 11.2 10*3/uL — ABNORMAL HIGH (ref 4.0–10.5)

## 2012-07-31 NOTE — H&P (Signed)
HISTORY AND PHYSICAL  [name] is a 79 y.o. female patient with CC: Pain and swelling left mandible  No diagnosis found.  Past Medical History  Diagnosis Date  . HYPOTHYROIDISM 03/27/2007  . VITAMIN D DEFICIENCY 06/30/2009  . HYPERLIPIDEMIA 03/27/2007  . Pernicious anemia 10/25/2006  . HYPERTENSION 07/12/2006  . EMPHYSEMA 01/26/2009  . ABSCESS, TOOTH 11/14/2009  . Polymyalgia rheumatica 03/27/2007  . OSTEOPOROSIS 07/12/2006  . Alcohol abuse     No current facility-administered medications for this encounter.   Current Outpatient Prescriptions  Medication Sig Dispense Refill  . cyanocobalamin (,VITAMIN B-12,) 1000 MCG/ML injection Inject 1 mL (1,000 mcg total) into the muscle every 30 (thirty) days.  25 mL  1  . metoprolol (LOPRESSOR) 50 MG tablet Take 50 mg by mouth daily with breakfast.      . predniSONE (DELTASONE) 5 MG tablet Take 5 mg by mouth daily.       . risedronate (ACTONEL) 150 MG tablet Take 150 mg by mouth every 30 (thirty) days. with water on empty stomach, nothing by mouth or lie down for next 30 minutes.      Marland Kitchen allopurinol (ZYLOPRIM) 300 MG tablet       . amLODipine (NORVASC) 5 MG tablet Take 1 tablet (5 mg total) by mouth daily.  90 tablet  3  . COLCRYS 0.6 MG tablet Take 0.6 mg by mouth daily.       . Insulin Syringe-Needle U-100 (MONOJECT INS SYR 1CC/25G) 25G X 5/8" 1 ML MISC 1 Device by Does not apply route every 28 (twenty-eight) days.  100 each  0   Allergies  Allergen Reactions  . Aspirin Other (See Comments)    "threw up blood"  . Codeine Other (See Comments)    "threw up blood"  . Naproxen Other (See Comments)    Severe pain in joints and hands  . Oxycodone-Acetaminophen     REACTION: gastric reaction   Active Problems:   * No active hospital problems. *  Vitals: There were no vitals taken for this visit. Lab results:No results found for this or any previous visit (from the past 24 hour(s)). Radiology Results: No results found. General  appearance: alert, cooperative and no distress Head: Normocephalic, without obvious abnormality, atraumatic Eyes: negative Ears: normal TM's and external ear canals both ears Nose: Nares normal. Septum midline. Mucosa normal. No drainage or sinus tenderness. Throat: Impacted tooth # 17 with purulent exudae and edema. Fractured toth # 20 Neck: no adenopathy, supple, symmetrical, trachea midline and thyroid not enlarged, symmetric, no tenderness/mass/nodules Resp: clear to auscultation bilaterally Cardio: regular rate and rhythm, S1, S2 normal, no murmur, click, rub or gallop  Assessment: 79 YO WF Htn, Emphysema, osteoporosis, hypothyroid, anemia, RA with infection left mandible associated with impacted tooth # 17, non-restorable tooth # 20.  Plan: Extraction teeth #'s 17, 20. General anesthesia. Day surgery.   Georgia Lopes 07/31/2012

## 2012-07-31 NOTE — Pre-Procedure Instructions (Signed)
TAMME MOZINGO  07/31/2012   Your procedure is scheduled on:  08/04/2012  Report to [facility] at 11:00 AM.  Call this number if you have problems the morning of surgery: [phone]   Remember:   Do not eat food or drink liquids after midnight.   Take these medicines the morning of surgery with A SIP OF WATER: allopurinol, amlodipine, metoprolol, prednisone    Do not wear jewelry, make-up or nail polish.  Do not wear lotions, powders, or perfumes. You may wear deodorant.  Do not shave 48 hours prior to surgery.  Do not bring valuables to the hospital.  [facility] is not responsible  for any belongings or valuables.  Contacts, dentures or bridgework may not be worn into surgery.  Leave suitcase in the car. After surgery it may be brought to your room.  For patients admitted to the hospital, checkout time is 11:00 AM the day of  discharge.   Patients discharged the day of surgery will not be allowed to drive  home.  Name and phone number of your driver: /w family  Special Instructions: Shower using CHG 2 nights before surgery and the night before surgery.  If you shower the day of surgery use CHG.  Use special wash - you have one bottle of CHG for all showers.  You should use approximately 1/3 of the bottle for each shower.   Please read over the following fact sheets that you were given: Pain Booklet, Coughing and Deep Breathing, MRSA Information and Surgical Site Infection Prevention

## 2012-08-03 MED ORDER — CLINDAMYCIN PHOSPHATE 900 MG/50ML IV SOLN
900.0000 mg | INTRAVENOUS | Status: DC
  Filled 2012-08-03: qty 50

## 2012-08-04 ENCOUNTER — Encounter (HOSPITAL_COMMUNITY): Payer: Self-pay | Admitting: Anesthesiology

## 2012-08-04 ENCOUNTER — Ambulatory Visit (HOSPITAL_COMMUNITY)
Admission: RE | Admit: 2012-08-04 | Discharge: 2012-08-04 | Disposition: A | Discharge status: Home / Self Care | Payer: Medicare Other | Source: Ambulatory Visit | Attending: Oral Surgery | Admitting: Oral Surgery

## 2012-08-04 ENCOUNTER — Encounter (HOSPITAL_COMMUNITY): Payer: Self-pay | Admitting: *Deleted

## 2012-08-04 ENCOUNTER — Encounter (HOSPITAL_COMMUNITY)
Admission: RE | Disposition: A | Discharge status: Home / Self Care | Payer: Self-pay | Source: Ambulatory Visit | Attending: Oral Surgery

## 2012-08-04 ENCOUNTER — Ambulatory Visit (HOSPITAL_COMMUNITY): Payer: Medicare Other | Admitting: Anesthesiology

## 2012-08-04 DIAGNOSIS — M810 Age-related osteoporosis without current pathological fracture: Secondary | ICD-10-CM | POA: Diagnosis not present

## 2012-08-04 DIAGNOSIS — K029 Dental caries, unspecified: Secondary | ICD-10-CM | POA: Diagnosis not present

## 2012-08-04 DIAGNOSIS — E785 Hyperlipidemia, unspecified: Secondary | ICD-10-CM | POA: Insufficient documentation

## 2012-08-04 DIAGNOSIS — J438 Other emphysema: Secondary | ICD-10-CM

## 2012-08-04 DIAGNOSIS — M353 Polymyalgia rheumatica: Secondary | ICD-10-CM

## 2012-08-04 DIAGNOSIS — E559 Vitamin D deficiency, unspecified: Secondary | ICD-10-CM | POA: Diagnosis not present

## 2012-08-04 DIAGNOSIS — I10 Essential (primary) hypertension: Secondary | ICD-10-CM

## 2012-08-04 DIAGNOSIS — E039 Hypothyroidism, unspecified: Secondary | ICD-10-CM | POA: Insufficient documentation

## 2012-08-04 DIAGNOSIS — K011 Impacted teeth: Secondary | ICD-10-CM

## 2012-08-04 DIAGNOSIS — K006 Disturbances in tooth eruption: Secondary | ICD-10-CM | POA: Diagnosis not present

## 2012-08-04 DIAGNOSIS — D510 Vitamin B12 deficiency anemia due to intrinsic factor deficiency: Secondary | ICD-10-CM

## 2012-08-04 DIAGNOSIS — K044 Acute apical periodontitis of pulpal origin: Secondary | ICD-10-CM | POA: Diagnosis not present

## 2012-08-04 DIAGNOSIS — H9193 Unspecified hearing loss, bilateral: Secondary | ICD-10-CM

## 2012-08-04 HISTORY — PX: TOOTH EXTRACTION: SHX859

## 2012-08-04 SURGERY — DENTAL RESTORATION/EXTRACTIONS
Anesthesia: General | Site: Mouth | Wound class: Clean Contaminated

## 2012-08-04 MED ORDER — FENTANYL CITRATE 0.05 MG/ML IJ SOLN
INTRAMUSCULAR | Status: DC | PRN
  Administered 2012-08-04 (×2): 50 ug via INTRAVENOUS

## 2012-08-04 MED ORDER — FENTANYL CITRATE 0.05 MG/ML IJ SOLN: 25.0000 ug | INTRAMUSCULAR | Status: DC | PRN

## 2012-08-04 MED ORDER — 0.9 % SODIUM CHLORIDE (POUR BTL) OPTIME
TOPICAL | Status: DC | PRN
  Administered 2012-08-04: 1000 mL

## 2012-08-04 MED ORDER — HYDROCODONE-ACETAMINOPHEN 5-325 MG PO TABS: 1.0000 | ORAL_TABLET | Freq: Four times a day (QID) | ORAL | Status: DC | PRN

## 2012-08-04 MED ORDER — LIDOCAINE-EPINEPHRINE 2 %-1:100000 IJ SOLN
INTRAMUSCULAR | Status: DC | PRN
  Administered 2012-08-04: 4 mL

## 2012-08-04 MED ORDER — PROPOFOL 10 MG/ML IV BOLUS
INTRAVENOUS | Status: DC | PRN
  Administered 2012-08-04: 150 mg via INTRAVENOUS

## 2012-08-04 MED ORDER — PROMETHAZINE HCL 25 MG/ML IJ SOLN: 6.2500 mg | INTRAMUSCULAR | Status: DC | PRN

## 2012-08-04 MED ORDER — ONDANSETRON HCL 4 MG/2ML IJ SOLN
INTRAMUSCULAR | Status: DC | PRN
  Administered 2012-08-04: 4 mg via INTRAVENOUS

## 2012-08-04 MED ORDER — EPHEDRINE SULFATE 50 MG/ML IJ SOLN
INTRAMUSCULAR | Status: DC | PRN
  Administered 2012-08-04: 5 mg via INTRAVENOUS

## 2012-08-04 MED ORDER — LIDOCAINE-EPINEPHRINE 2 %-1:100000 IJ SOLN
INTRAMUSCULAR | Status: AC
  Filled 2012-08-04: qty 1

## 2012-08-04 MED ORDER — SUCCINYLCHOLINE CHLORIDE 20 MG/ML IJ SOLN
INTRAMUSCULAR | Status: DC | PRN
  Administered 2012-08-04: 100 mg via INTRAVENOUS

## 2012-08-04 MED ORDER — LACTATED RINGERS IV SOLN
INTRAVENOUS | Status: DC | PRN
  Administered 2012-08-04: 12:00:00 via INTRAVENOUS

## 2012-08-04 MED ORDER — SODIUM CHLORIDE 0.9 % IR SOLN
Status: DC | PRN
  Administered 2012-08-04: 1000 mL

## 2012-08-04 MED ORDER — ARTIFICIAL TEARS OP OINT
TOPICAL_OINTMENT | OPHTHALMIC | Status: DC | PRN
  Administered 2012-08-04: 1 via OPHTHALMIC

## 2012-08-04 MED ORDER — LACTATED RINGERS IV SOLN: INTRAVENOUS | Status: DC

## 2012-08-04 MED ORDER — CLINDAMYCIN PHOSPHATE 600 MG/50ML IV SOLN
600.0000 mg | Freq: Four times a day (QID) | INTRAVENOUS | Status: DC
  Administered 2012-08-04: 600 mg via INTRAVENOUS

## 2012-08-04 SURGICAL SUPPLY — 35 items
BLADE SURG 15 STRL LF DISP TIS (BLADE) IMPLANT
BLADE SURG 15 STRL SS (BLADE)
BUR CROSS CUT FISSURE 1.6 (BURR) ×1 IMPLANT
BUR EGG ELITE 4.0 (BURR) IMPLANT
BUR SURG 4X8 MED (BURR) IMPLANT
BURR SURG 4X8 MED (BURR)
CANISTER SUCTION 2500CC (MISCELLANEOUS) ×2 IMPLANT
CLOTH BEACON ORANGE TIMEOUT ST (SAFETY) ×2 IMPLANT
COVER SURGICAL LIGHT HANDLE (MISCELLANEOUS) ×2 IMPLANT
DECANTER SPIKE VIAL GLASS SM (MISCELLANEOUS) IMPLANT
GAUZE PACKING FOLDED 2  STR (GAUZE/BANDAGES/DRESSINGS) ×1
GAUZE PACKING FOLDED 2 STR (GAUZE/BANDAGES/DRESSINGS) ×1 IMPLANT
GAUZE SPONGE 4X4 16PLY XRAY LF (GAUZE/BANDAGES/DRESSINGS) IMPLANT
GLOVE BIO SURGEON STRL SZ 6.5 (GLOVE) ×4 IMPLANT
GLOVE BIO SURGEON STRL SZ7 (GLOVE) IMPLANT
GLOVE BIO SURGEON STRL SZ7.5 (GLOVE) ×3 IMPLANT
GLOVE BIOGEL PI IND STRL 7.0 (GLOVE) ×1 IMPLANT
GLOVE BIOGEL PI INDICATOR 7.0 (GLOVE) ×1
GOWN STRL NON-REIN LRG LVL3 (GOWN DISPOSABLE) ×3 IMPLANT
GOWN STRL REIN XL XLG (GOWN DISPOSABLE) ×2 IMPLANT
KIT BASIN OR (CUSTOM PROCEDURE TRAY) ×2 IMPLANT
KIT ROOM TURNOVER OR (KITS) ×2 IMPLANT
NDL BLUNT 16X1.5 OR ONLY (NEEDLE) IMPLANT
NEEDLE 22X1 1/2 (OR ONLY) (NEEDLE) ×2 IMPLANT
NEEDLE BLUNT 16X1.5 OR ONLY (NEEDLE) IMPLANT
NS IRRIG 1000ML POUR BTL (IV SOLUTION) ×2 IMPLANT
PAD ARMBOARD 7.5X6 YLW CONV (MISCELLANEOUS) ×4 IMPLANT
SUT CHROMIC 3 0 PS 2 (SUTURE) ×3 IMPLANT
SYR 50ML SLIP (SYRINGE) IMPLANT
TOWEL OR 17X24 6PK STRL BLUE (TOWEL DISPOSABLE) IMPLANT
TOWEL OR 17X26 10 PK STRL BLUE (TOWEL DISPOSABLE) ×2 IMPLANT
TRAY ENT MC OR (CUSTOM PROCEDURE TRAY) ×2 IMPLANT
TUBING IRRIGATION (MISCELLANEOUS) ×1 IMPLANT
WATER STERILE IRR 1000ML POUR (IV SOLUTION) ×2 IMPLANT
YANKAUER SUCT BULB TIP NO VENT (SUCTIONS) ×2 IMPLANT

## 2012-08-04 NOTE — Anesthesia Preprocedure Evaluation (Addendum)
Anesthesia Evaluation  Patient identified by MRN, date of birth, ID band Patient awake    Reviewed: Allergy & Precautions, H&P , NPO status , Patient's Chart, lab work & pertinent test results, reviewed documented beta blocker date and time   History of Anesthesia Complications Negative for: history of anesthetic complications  Airway Mallampati: II TM Distance: >3 FB Neck ROM: Full    Dental  (+) Edentulous Upper, Dental Advisory Given and Poor Dentition   Pulmonary COPD   Pulmonary exam normal       Cardiovascular hypertension, Pt. on home beta blockers     Neuro/Psych negative neurological ROS     GI/Hepatic negative GI ROS, (+)     substance abuse  alcohol use,   Endo/Other  Hypothyroidism   Renal/GU negative Renal ROS     Musculoskeletal   Abdominal   Peds  Hematology   Anesthesia Other Findings   Reproductive/Obstetrics                          Anesthesia Physical Anesthesia Plan  ASA: III  Anesthesia Plan: General   Post-op Pain Management:    Induction: Intravenous  Airway Management Planned: Oral ETT  Additional Equipment:   Intra-op Plan:   Post-operative Plan: Extubation in OR  Informed Consent: I have reviewed the patients History and Physical, chart, labs and discussed the procedure including the risks, benefits and alternatives for the proposed anesthesia with the patient or authorized representative who has indicated his/her understanding and acceptance.   Dental advisory given  Plan Discussed with: CRNA, Anesthesiologist and Surgeon  Anesthesia Plan Comments:        Anesthesia Quick Evaluation

## 2012-08-04 NOTE — Transfer of Care (Signed)
Immediate Anesthesia Transfer of Care Note  Patient: ALLEAN MONTFORT  Procedure(s) Performed: Procedure(s): EXTRACTIONS 17, 20 (N/A)  Patient Location: PACU  Anesthesia Type:General  Level of Consciousness: awake, oriented and patient cooperative  Airway & Oxygen Therapy: Patient Spontanous Breathing and Patient connected to nasal cannula oxygen  Post-op Assessment: Report given to PACU RN and Post -op Vital signs reviewed and stable  Post vital signs: Reviewed and stable  Complications: No apparent anesthesia complications

## 2012-08-04 NOTE — Preoperative (Signed)
Beta Blockers   Reason not to administer Beta Blockers:Lopressor taken this am 

## 2012-08-04 NOTE — Anesthesia Procedure Notes (Signed)
Procedure Name: Intubation Date/Time: 08/04/2012 1:35 PM Performed by: Sherie Don Pre-anesthesia Checklist: Patient identified, Emergency Drugs available, Suction available, Patient being monitored and Timeout performed Patient Re-evaluated:Patient Re-evaluated prior to inductionOxygen Delivery Method: Circle system utilized Preoxygenation: Pre-oxygenation with 100% oxygen Intubation Type: IV induction Ventilation: Mask ventilation without difficulty Laryngoscope Size: Mac Grade View: Grade I Tube type: Oral Tube size: 7.0 mm Number of attempts: 1 Airway Equipment and Method: Stylet and LTA kit utilized Placement Confirmation: ETT inserted through vocal cords under direct vision,  positive ETCO2 and breath sounds checked- equal and bilateral Secured at: 23 cm Tube secured with: Tape Dental Injury: Teeth and Oropharynx as per pre-operative assessment

## 2012-08-04 NOTE — H&P (Signed)
H&P documentation  -History and Physical Reviewed  -Patient has been re-examined  -No change in the plan of care  Cordarrell Sane M  

## 2012-08-04 NOTE — Op Note (Signed)
08/04/2012  2:02 PM  PATIENT:  [name]  79 y.o. female  PRE-OPERATIVE DIAGNOSIS:  IMPACTED TOOTH # 17, NON RESTORABLE TOOTH #20  POST-OPERATIVE DIAGNOSIS:  SAME  PROCEDURE:  Procedure(s): EXTRACTIONS 17, 20  SURGEON:  Surgeon(s): Georgia Lopes, DDS  ANESTHESIA:   local and general  EBL:  minimal  DRAINS: none   SPECIMEN:  No Specimen  COUNTS:  YES  PLAN OF CARE: Discharge to home after PACU  PATIENT DISPOSITION:  PACU - hemodynamically stable.   PROCEDURE DETAILS: Dictation # [id number]  Georgia Lopes, DMD 08/04/2012 2:02 PM

## 2012-08-05 ENCOUNTER — Encounter (HOSPITAL_COMMUNITY): Payer: Self-pay | Admitting: Oral Surgery

## 2012-08-05 NOTE — Anesthesia Postprocedure Evaluation (Signed)
  Anesthesia Post-op Note  Patient: [name]  Procedure(s) Performed: Procedure(s): EXTRACTIONS 17, 20 (N/A)  Pt D/C to home prior to visit. No apparent anesthetic complications

## 2012-08-05 NOTE — Op Note (Signed)
[name], [name]           ACCOUNT NO.:  [id number]  MEDICAL RECORD NO.:  [id number]  LOCATION:  [facility]                         FACILITY:  [facility]  PHYSICIAN:  Georgia Lopes, M.D.  DATE OF BIRTH:  1928/05/22  DATE OF PROCEDURE:  08/04/2012 DATE OF DISCHARGE:  08/04/2012                              OPERATIVE REPORT   PREOPERATIVE DIAGNOSIS:  Impacted tooth #17, Nonrestorable tooth #20 secondary to dental caries.  POSTOPERATIVE DIAGNOSIS:  Impacted tooth #17, Nonrestorable tooth #20 secondary to dental caries.  PROCEDURE:  Extraction of teeth numbers 17 and 20.  SURGEON:  Georgia Lopes, M.D.  ANESTHESIA:  General, oral intubation.  Quita Skye Krista Blue, M.D. attending.  INDICATIONS FOR PROCEDURE:  [name] is an 79 year old female who is a former patient who had some swelling and pain of the left mandible.  She was found to have a deeply imbedded and impacted tooth #17 with pericarditis.  She was placed on antibiotics because of the difficulty anticipated with removal of this tooth and potential fracture of the mandible.  It was recommended that surgery be performed as an outpatient with general anesthesia with intubation for airway protection.  PROCEDURE IN DETAIL:  The patient was taken to the operating room, placed on the table in supine position.  General anesthesia was administered intravenously, and the oral tube was secured after being placed endotracheally.  Then, the eyes were protected.  The patient was draped for the procedure.  Time-out was performed.  The posterior pharynx was suctioned, and a throat pack was placed.  A 2% lidocaine with 1:100,000 epinephrine was infiltrated in an inferior alveolar block on the left side of the mandible, and an infiltration around the teeth #17 and 20.  A sweetheart retractor and bite block were placed in the mouth and then a 15 blade was used to make a full-thickness incision overlying tooth #17 and incision around tooth #20.   The periosteal elevator was used to reflect the periosteum over tooth #17 and around tooth #20.  Then bone was removed using the Stryker handpiece under irrigation and tooth #17 was sectioned in a buccal lingually and the tooth was removed after another section to remove bone around the roots. Then, tooth #20 was removed using an Asch forceps.  The sockets were then irrigated and curetted and closed with 3-0 chromic.  The oral cavity was then inspected and found to have good hemostasis.  The oral cavity was irrigated, suctioned, and throat pack was removed.  The patient was awakened and taken to the recovery room, breathing spontaneously in good condition.  ESTIMATED BLOOD LOSS:  Minimum.  COMPLICATIONS:  None.  SPECIMENS:  None.     Georgia Lopes, M.D.     SMJ/MEDQ  D:  08/04/2012  T:  08/05/2012  Job:  [id number]

## 2012-08-11 ENCOUNTER — Ambulatory Visit (INDEPENDENT_AMBULATORY_CARE_PROVIDER_SITE_OTHER): Payer: Medicare Other | Admitting: Family Medicine

## 2012-08-11 ENCOUNTER — Encounter: Payer: Self-pay | Admitting: Family Medicine

## 2012-08-11 VITALS — BP 110/80 | Temp 97.9°F | Ht 60.5 in | Wt 112.0 lb

## 2012-08-11 DIAGNOSIS — R7303 Prediabetes: Secondary | ICD-10-CM

## 2012-08-11 DIAGNOSIS — R7309 Other abnormal glucose: Secondary | ICD-10-CM | POA: Diagnosis not present

## 2012-08-11 DIAGNOSIS — M109 Gout, unspecified: Secondary | ICD-10-CM

## 2012-08-11 DIAGNOSIS — I10 Essential (primary) hypertension: Secondary | ICD-10-CM

## 2012-08-11 DIAGNOSIS — Z7189 Other specified counseling: Secondary | ICD-10-CM

## 2012-08-11 DIAGNOSIS — Z7689 Persons encountering health services in other specified circumstances: Secondary | ICD-10-CM

## 2012-08-11 HISTORY — DX: Gout, unspecified: M10.9

## 2012-08-11 MED ORDER — AMLODIPINE BESYLATE 5 MG PO TABS: 5.0000 mg | ORAL_TABLET | Freq: Every day | ORAL | Status: DC

## 2012-08-11 NOTE — Patient Instructions (Signed)
-  stop the metoprolol  -continue the norvasc  -check blood pressure 1-2 times per week and keep a written log to bring to your appointment  -follow up in 1 month for physical and to recheck blood pressure

## 2012-08-11 NOTE — Progress Notes (Signed)
Chief Complaint  Patient presents with  . Establish Care    HPI:  [name] is here to establish care. She used to see Dr. Tawanna Cooler, but wanted a female doctor. She has a rheumatologist (Dr. Harriet Masson) whom she sees for her polymyalgia rheumatica, gout and osteoporosis. Other medical problems include HTN, emphysema, hearing loss and prediabetes per review of labs. Had recent labs. Last PCP and physical: about 1 year ago with prior PCP Has the following chronic problems and concerns today:  Patient Active Problem List   Diagnosis Date Noted  . Prediabetes 08/11/2012  . Gout 08/11/2012  . Hearing loss of both ears 06/04/2012  . VITAMIN D DEFICIENCY 06/30/2009  . EMPHYSEMA 01/26/2009  . POLYMYALGIA RHEUMATICA 03/27/2007  . PERNICIOUS ANEMIA 10/25/2006  . HYPERTENSION 07/12/2006  . OSTEOPOROSIS 07/12/2006    Health Maintenance:  ROS: See pertinent positives and negatives per HPI.  Past Medical History  Diagnosis Date  . VITAMIN D DEFICIENCY 06/30/2009  . HYPERLIPIDEMIA 03/27/2007  . Pernicious anemia 10/25/2006  . HYPERTENSION 07/12/2006  . ABSCESS, TOOTH 11/14/2009  . Polymyalgia rheumatica 03/27/2007  . OSTEOPOROSIS 07/12/2006  . Alcohol abuse   . EMPHYSEMA 01/26/2009    pt. unsure of this reports as of 07/31/2012  . Arthritis     polymyalgia rhuematica , all over her body  . Gout 08/11/2012    No family history on file.  History   Social History  . Marital Status: Married    Spouse Name: N/A    Number of Children: N/A  . Years of Education: N/A   Social History Main Topics  . Smoking status: Former Smoker    Quit date: 07/12/2006  . Smokeless tobacco: None  . Alcohol Use: 6.0 oz/week    10 Shots of liquor per week     Comment: quit completely - 2012  . Drug Use: No  . Sexually Active: No   Other Topics Concern  . None   Social History Narrative  . None    Current outpatient prescriptions:alendronate (FOSAMAX) 70 MG tablet, Take 70 mg by mouth every 7  (seven) days. , Disp: , Rfl: ;  allopurinol (ZYLOPRIM) 300 MG tablet, Take 300 mg by mouth daily. , Disp: , Rfl: ;  amLODipine (NORVASC) 5 MG tablet, Take 1 tablet (5 mg total) by mouth daily., Disp: 90 tablet, Rfl: 3 cyanocobalamin (,VITAMIN B-12,) 1000 MCG/ML injection, Inject 1 mL (1,000 mcg total) into the muscle every 30 (thirty) days., Disp: 25 mL, Rfl: 1;  HYDROcodone-acetaminophen (NORCO) 5-325 MG per tablet, Take 1 tablet by mouth every 6 (six) hours as needed for pain., Disp: 30 tablet, Rfl: 1 Insulin Syringe-Needle U-100 (MONOJECT INS SYR 1CC/25G) 25G X 5/8" 1 ML MISC, 1 Device by Does not apply route every 28 (twenty-eight) days., Disp: 100 each, Rfl: 0;  metoprolol (LOPRESSOR) 50 MG tablet, Take 50 mg by mouth daily with breakfast., Disp: , Rfl: ;  predniSONE (DELTASONE) 5 MG tablet, Take 5 mg by mouth daily before breakfast. , Disp: , Rfl:   EXAM:  Filed Vitals:   08/11/12 1136  BP: 110/80  Temp: 97.9 F (36.6 C)    Body mass index is 21.51 kg/(m^2).  GENERAL: vitals reviewed and listed above, alert, oriented, appears well hydrated and in no acute distress  HEENT: atraumatic, conjunttiva clear, no obvious abnormalities on inspection of external nose and ears  NECK: no obvious masses on inspection  LUNGS: clear to auscultation bilaterally, no wheezes, rales or rhonchi, good air movement  CV: HRRR, no peripheral edema  MS: moves all extremities without noticeable abnormality  PSYCH: pleasant and cooperative, no obvious depression or anxiety  ASSESSMENT AND PLAN:  Discussed the following assessment and plan:  Prediabetes  Gout -We reviewed the PMH, PSH, FH, SH, Meds and Allergies. -We provided refills for any medications we will prescribe as needed. -We addressed current concerns per orders and patient instructions. -We have asked for records for pertinent exams, studies, vaccines and notes from previous providers. -We have advised patient to follow up per  instructions below. -Influenza vaccine given today  -Patient advised to return or notify a doctor immediately if symptoms worsen or persist or new concerns arise.  There are no Patient Instructions on file for this visit.   [name]

## 2012-08-15 ENCOUNTER — Telehealth: Payer: Self-pay | Admitting: Family Medicine

## 2012-08-15 DIAGNOSIS — I10 Essential (primary) hypertension: Secondary | ICD-10-CM

## 2012-08-15 NOTE — Telephone Encounter (Signed)
Pt would like you to send the rx for amLODipine (NORVASC) 5 MG tablet to   90 day supply  1 x day [facility]  [phone]   pt needs generic,  (They do not have to pay for generics at [facility] and [facility] wanted $107.00).

## 2012-08-18 MED ORDER — AMLODIPINE BESYLATE 5 MG PO TABS: 5.0000 mg | ORAL_TABLET | Freq: Every day | ORAL | Status: DC

## 2012-08-18 NOTE — Telephone Encounter (Signed)
Rx sent to [facility].   

## 2012-08-29 ENCOUNTER — Telehealth: Payer: Self-pay | Admitting: Family Medicine

## 2012-08-29 ENCOUNTER — Ambulatory Visit (INDEPENDENT_AMBULATORY_CARE_PROVIDER_SITE_OTHER): Payer: Medicare Other | Admitting: Family Medicine

## 2012-08-29 ENCOUNTER — Ambulatory Visit (INDEPENDENT_AMBULATORY_CARE_PROVIDER_SITE_OTHER)
Admission: RE | Admit: 2012-08-29 | Discharge: 2012-08-29 | Disposition: A | Discharge status: Home / Self Care | Payer: Medicare Other | Source: Ambulatory Visit | Attending: Family Medicine | Admitting: Family Medicine

## 2012-08-29 ENCOUNTER — Encounter: Payer: Self-pay | Admitting: Family Medicine

## 2012-08-29 VITALS — BP 144/70 | Temp 97.8°F

## 2012-08-29 VITALS — BP 140/72 | HR 108 | Wt 113.0 lb

## 2012-08-29 DIAGNOSIS — S92351A Displaced fracture of fifth metatarsal bone, right foot, initial encounter for closed fracture: Secondary | ICD-10-CM

## 2012-08-29 DIAGNOSIS — S92309A Fracture of unspecified metatarsal bone(s), unspecified foot, initial encounter for closed fracture: Secondary | ICD-10-CM

## 2012-08-29 DIAGNOSIS — M25579 Pain in unspecified ankle and joints of unspecified foot: Secondary | ICD-10-CM | POA: Diagnosis not present

## 2012-08-29 DIAGNOSIS — S8990XA Unspecified injury of unspecified lower leg, initial encounter: Secondary | ICD-10-CM | POA: Diagnosis not present

## 2012-08-29 DIAGNOSIS — M25571 Pain in right ankle and joints of right foot: Secondary | ICD-10-CM

## 2012-08-29 DIAGNOSIS — M79609 Pain in unspecified limb: Secondary | ICD-10-CM | POA: Diagnosis not present

## 2012-08-29 DIAGNOSIS — S99929A Unspecified injury of unspecified foot, initial encounter: Secondary | ICD-10-CM | POA: Diagnosis not present

## 2012-08-29 HISTORY — DX: Displaced fracture of fifth metatarsal bone, right foot, initial encounter for closed fracture: S92.351A

## 2012-08-29 MED ORDER — TRAMADOL HCL 50 MG PO TABS: 25.0000 mg | ORAL_TABLET | Freq: Three times a day (TID) | ORAL | Status: DC | PRN

## 2012-08-29 NOTE — Progress Notes (Signed)
  I'm seeing this patient by the request  of:  Dr. Selena Batten  CC: Right foot pain  HPI: Patient is an extremely pleasant 79 year old female coming in with one day history of right foot pain. Patient remembers taking a step from the grafts to concrete and having significant pain on the lateral aspect of her foot. Patient was able to continue to go to the store but started having more and more pain. Patient describes the pain as more of a dull ache with sharp pain with certain movements. Patient went to bed and woke up this morning with significant swelling of the right foot. Patient had trouble with ambulation as well. Patient then went to her primary care provider who did get x-rays. X-ray show that patient did have a fracture of her foot and was sent here for evaluation. Patient states that she had a postop boot inputted on which has helped. Patient is not taking any medicine. Patient denies any fevers or chills or any abnormal weight loss. Patient does have a past medical history significant for polymyalgia rheumatica as well as osteoporosis and is on chronic prednisone. Severity: 6-7/10  Past medical, surgical, family and social history reviewed. Medications reviewed all in the electronic medical record.   Review of Systems: No headache, visual changes, nausea, vomiting, diarrhea, constipation, dizziness, abdominal pain, skin rash, fevers, chills, night sweats, weight loss, swollen lymph nodes, body aches, joint swelling, muscle aches, chest pain, shortness of breath, mood changes.   Objective:    Blood pressure 140/72, pulse 108, weight 113 lb (51.256 kg), SpO2 99.00%.   General: No apparent distress alert and oriented x3 mood and affect normal, dressed appropriately.  HEENT: Pupils equal, extraocular movements intact Respiratory: Patient's speak in full sentences and does not appear short of breath Cardiovascular: No lower extremity edema, non tender, no erythema Skin: Warm dry intact with no signs  of infection or rash on extremities or on axial skeleton. Abdomen: Soft nontender Neuro: Cranial nerves II through XII are intact, neurovascularly intact in all extremities with 2+ DTRs and 2+ pulses. Lymph: No lymphadenopathy of posterior or anterior cervical chain or axillae bilaterally.  Gait antalgic with walker protecting right foot with good balance and coordination.  MSK: Non tender with full range of motion and good stability and symmetric strength and tone of shoulders, elbows, wrist, knee bilaterally.  Right foot exam shows that she does have trace effusion over the lateral aspect of the midfoot. She is tender to palpation at the base of the fifth metatarsal. She is neurovascularly intact distally and can move all toes. She is nontender over the medial malleolus or lateral malleolus. Nontender over the fibular head. Left foot is unremarkable  X-rays were reviewed by me today. Patient does have a extra-articular avulsion fracture of the fifth metatarsal that is minimally displaced but in good alignment.  Impression and Recommendations:     This case required medical decision making of moderate complexity.

## 2012-08-29 NOTE — Assessment & Plan Note (Signed)
Patient does have fracture of the fifth metatarsal but it is stable being that it is only an avulsion and extra-articular. Continue postop boot Tylenol for pain Icing protocol Continue with walker Return in 3 weeks and we will rex-ray. The patient is showing good callus formation she likely can be back in her shoe in 6 weeks and start formal physical therapy for range of motion exercises. Encourage her to discuss chronic prednisone use with her rheumatologist. It appears that this has been a long-term and likely avoiding adrenal insufficiency. Vitamin D at least 2000 units daily in encourage her to have this drawn and check at next physical which is within this month.

## 2012-08-29 NOTE — Telephone Encounter (Signed)
Patient Information:  Caller Name: Aleathea  Phone: [phone]  Patient: [name]  Gender: Female  DOB: January 12, 1929  Age: 79 Years  PCP: [name] [facility])  Office Follow Up:  Does the office need to follow up with this patient?: No  Instructions For The Office: N/A   Symptoms  Reason For Call & Symptoms: Pt took a step and feels that she broke a bone in her right foot. Onset yesterday.  Reviewed Health History In EMR: Yes  Reviewed Medications In EMR: Yes  Reviewed Allergies In EMR: Yes  Reviewed Surgeries / Procedures: Yes  Date of Onset of Symptoms: 08/28/2012  Guideline(s) Used:  Foot Pain  Disposition Per Guideline:   See Within 3 Days in Office  Reason For Disposition Reached:   Moderate pain (e.g., interferes with normal activities, limping) and present > 3 days  Advice Given:  N/A  Patient Will Follow Care Advice:  YES  Appointment Scheduled:  08/29/2012 13:30:00 Appointment Scheduled Provider:  [name] [facility])

## 2012-08-29 NOTE — Progress Notes (Addendum)
Chief Complaint  Patient presents with  . right foot pain    HPI:  [name] is here for and acute visit for foot pain: -was out weeding and twisted when stepping onto patio and felt like had a pop and feels broke her foot -was able to walk on it fine initially, but later started hurting worse later on - a little swelling and bruising of foot -denies: fevers, chills, weakness, numbness, LOC, head or other injury  ROS: See pertinent positives and negatives per HPI.  Past Medical History  Diagnosis Date  . VITAMIN D DEFICIENCY 06/30/2009  . HYPERLIPIDEMIA 03/27/2007  . Pernicious anemia 10/25/2006  . HYPERTENSION 07/12/2006  . ABSCESS, TOOTH 11/14/2009  . Polymyalgia rheumatica 03/27/2007  . OSTEOPOROSIS 07/12/2006  . Alcohol abuse   . EMPHYSEMA 01/26/2009    pt. unsure of this reports as of 07/31/2012  . Arthritis     polymyalgia rhuematica , all over her body  . Gout 08/11/2012    Family History  Problem Relation Age of Onset  . Mental illness Mother     alzheimer dementia  . Ulcers Father   . Heart disease Father   . Cancer Father     lung, smoker    History   Social History  . Marital Status: Married    Spouse Name: N/A    Number of Children: N/A  . Years of Education: N/A   Social History Main Topics  . Smoking status: Former Smoker    Quit date: 07/12/2006  . Smokeless tobacco: None  . Alcohol Use: 6.0 oz/week    10 Shots of liquor per week     Comment: quit completely - 2012  . Drug Use: No  . Sexually Active: No   Other Topics Concern  . None   Social History Narrative   Work or School: none      Home Situation: lives with husband - drives, husband takes care of finances, dresses herself, takes care of housework and cooking      Spiritual Beliefs: [employer]      Lifestyle: uses cane when goes out, exercises sometimes - diet healthy             Current outpatient prescriptions:alendronate (FOSAMAX) 70 MG tablet, Take 70 mg by mouth  every 7 (seven) days. , Disp: , Rfl: ;  allopurinol (ZYLOPRIM) 300 MG tablet, Take 300 mg by mouth daily. , Disp: , Rfl: ;  amLODipine (NORVASC) 5 MG tablet, Take 1 tablet (5 mg total) by mouth daily., Disp: 90 tablet, Rfl: 3 cyanocobalamin (,VITAMIN B-12,) 1000 MCG/ML injection, Inject 1 mL (1,000 mcg total) into the muscle every 30 (thirty) days., Disp: 25 mL, Rfl: 1;  HYDROcodone-acetaminophen (NORCO) 5-325 MG per tablet, Take 1 tablet by mouth every 6 (six) hours as needed for pain., Disp: 30 tablet, Rfl: 1 Insulin Syringe-Needle U-100 (MONOJECT INS SYR 1CC/25G) 25G X 5/8" 1 ML MISC, 1 Device by Does not apply route every 28 (twenty-eight) days., Disp: 100 each, Rfl: 0;  predniSONE (DELTASONE) 5 MG tablet, Take 5 mg by mouth daily before breakfast. , Disp: , Rfl: ;  traMADol (ULTRAM) 50 MG tablet, Take 0.5 tablets (25 mg total) by mouth every 8 (eight) hours as needed for pain., Disp: 15 tablet, Rfl: 0  EXAM:  Filed Vitals:   08/29/12 1339  BP: 144/70  Temp: 97.8 F (36.6 C)    There is no weight on file to calculate BMI.  GENERAL: vitals reviewed and listed  above, alert, oriented, appears well hydrated and in no acute distress  HEENT: atraumatic, conjunttiva clear, no obvious abnormalities on inspection of external nose and ears  NECK: no obvious masses on inspection  LUNGS: clear to auscultation bilaterally, no wheezes, rales or rhonchi, good air movement  CV: HRRR, no peripheral edema  MS: moves all extremities without noticeable abnormality -unable to bear weight -bruising and swelling R dorsal foot -normal cap refill, TTP 5th metatarsal head, R lower medial tibia  PSYCH: pleasant and cooperative, no obvious depression or anxiety  ASSESSMENT AND PLAN:  Discussed the following assessment and plan:  Pain in joint, ankle and foot, right - Plan: DG Foot Complete Right, DG Tibia/Fibula Right, traMADol (ULTRAM) 50 MG tablet  -pain, swelling in dorsal foot and lower tibia R s/p  twisting foot injury yesterday. Plain films. Also notified of ortho walk in clinic and will send if fx or if no fex given degree of swelling/pain as likely needs immobilization. Cell number: [phone] to call with results, DeeDee Lendell Caprice - with pt and fine to call with results -Patient advised to return or notify a doctor immediately if symptoms worsen or persist or new concerns arise.  There are no Patient Instructions on file for this visit.   [name]

## 2012-08-29 NOTE — Progress Notes (Signed)
Quick Note:  Per Dr. Selena Batten called pt to advise 5th metatarsal fracture. ______

## 2012-09-02 DIAGNOSIS — M109 Gout, unspecified: Secondary | ICD-10-CM | POA: Diagnosis not present

## 2012-09-02 DIAGNOSIS — M810 Age-related osteoporosis without current pathological fracture: Secondary | ICD-10-CM | POA: Diagnosis not present

## 2012-09-02 DIAGNOSIS — M353 Polymyalgia rheumatica: Secondary | ICD-10-CM | POA: Diagnosis not present

## 2012-09-08 ENCOUNTER — Ambulatory Visit (INDEPENDENT_AMBULATORY_CARE_PROVIDER_SITE_OTHER): Payer: Medicare Other | Admitting: Family Medicine

## 2012-09-08 ENCOUNTER — Encounter: Payer: Self-pay | Admitting: Family Medicine

## 2012-09-08 VITALS — BP 132/80 | HR 76 | Temp 98.0°F | Ht 60.5 in | Wt 114.0 lb

## 2012-09-08 DIAGNOSIS — R7309 Other abnormal glucose: Secondary | ICD-10-CM

## 2012-09-08 DIAGNOSIS — E785 Hyperlipidemia, unspecified: Secondary | ICD-10-CM

## 2012-09-08 DIAGNOSIS — I10 Essential (primary) hypertension: Secondary | ICD-10-CM

## 2012-09-08 DIAGNOSIS — Z23 Encounter for immunization: Secondary | ICD-10-CM

## 2012-09-08 DIAGNOSIS — Z0000 Encounter for general adult medical examination without abnormal findings: Secondary | ICD-10-CM | POA: Diagnosis not present

## 2012-09-08 DIAGNOSIS — R7303 Prediabetes: Secondary | ICD-10-CM

## 2012-09-08 LAB — LIPID PANEL
Cholesterol: 219 mg/dL — ABNORMAL HIGH (ref 0–200)
Total CHOL/HDL Ratio: 3
Triglycerides: 155 mg/dL — ABNORMAL HIGH (ref 0.0–149.0)
VLDL: 31 mg/dL (ref 0.0–40.0)

## 2012-09-08 LAB — BASIC METABOLIC PANEL
Calcium: 9.6 mg/dL (ref 8.4–10.5)
Creatinine, Ser: 1 mg/dL (ref 0.4–1.2)

## 2012-09-08 LAB — LDL CHOLESTEROL, DIRECT: Direct LDL: 115.5 mg/dL

## 2012-09-08 NOTE — Progress Notes (Addendum)
Medicare Annual Preventive Care Visit  (initial annual wellness or annual wellness exam)  Concerns today:  1)HTN: -stopped metoprolol last visit per her request, takes norvasc 5mg  -she was to keep BP log and follow up today: most home BP have been good, few SBP 150-160s -reports: doing good -denies: CP, SOB, DOE, palpitations, swelling  1.) Patient-completed health risk assessment  - completed and reviewed, see scanned documentation  2.) Review of Medical History: -PMH, PSH, Family History and current specialty and care providers reviewed and updated and listed below  - see chart and below  3.) Review of functional ability and level of safety:  Any difficulty hearing? Little trouble hearing  History of falling? NO  Any trouble with IADLs - using a phone, using transportation, grocery shopping, preparing meals, doing housework, doing laundry, taking medications and managing money? NO  Advance Directives? YES   See summary of recommendations in Patient Instructions below.  4.) Physical Exam Filed Vitals:   09/08/12 1044  BP: 132/80  Pulse: 76  Temp: 98 F (36.7 C)   Estimated body mass index is 21.89 kg/(m^2) as calculated from the following:   Height as of this encounter: 5' 0.5" (1.537 m).   Weight as of this encounter: 114 lb (51.71 kg).  Mini Cog: 1. Patient instructed to listen carefully and repeat the following: Apple Watch    Penny  2. Clock drawing test was administered: NORMAL      3. Recall of three words: 3 words recalled  Scoring:  Patient Score: NEG    See patient instructions for recommendations.  4)The following written screening schedule of preventive measures were reviewed with assessment and plan made per below, orders and patient instructions:       Alcohol screening: done, no concerns     Obesity Screening and counseling: done, no concerns     STI screening: done, no concerns     Tobacco Screening: done, no concerns    Pneumococcal (PPSV23 -one dose after 64, one before if risk factors), influenza yearly and hepatitis B vaccines (if high risk - end stage renal disease, IV drugs, homosexual men, live in home for mentally retarded, hemophilia receiving factors) ASSESSMENT/PLAN: given today      Screening mammograph (yearly if >40) ASSESSMENT/PLAN: she has decided to stop doing breast cancer screening and refuses      Screening Pap smear/pelvic exam (q2 years) ASSESSMENT/PLAN: not indicated due to age      Prostate cancer screening ASSESSMENT/PLAN: not applicable      Colorectal cancer screening (FOBT yearly or flex sig q4y or colonoscopy q10y or barium enema q4y) ASSESSMENT/PLAN: not indicated due to age and refused      Diabetes outpatient self-management training services ASSESSMENT/PLAN: not aplplicable      Bone mass measurements(covered q2y if indicated - estrogen def, osteoporosis, hyperparathyroid, vertebral abnormalities, osteoporosis or steroids) ASSESSMENT/PLAN:  Deferred, followed by rheumatologist      Screening for glaucoma(q1y if high risk - diabetes, FH, AA and > 50 or hispanic and > 65): ASSESSMENT/PLAN:  Not applicable      Medical nutritional therapy for individuals with diabetes or renal disease ASSESSMENT/PLAN: not applicable      Cardiovascular screening blood tests (lipids q5y) ASSESSMENT/PLAN: obtained today      Diabetes screening tests ASSESSMENT/PLAN: obtained today   7.) Summary: -risk factors and conditions per above assessment were discussed and treatment, recommendations and referrals were offered per documentation above and orders and patient instructions. -Blood pressure great on recheck after  sitting, will have her monitor home BP and follow up in 3-4 months. -pneumonia vaccine today -NON-FASTING labs today

## 2012-09-08 NOTE — Addendum Note (Signed)
Addended by: Alfred Levins D on: 09/08/2012 11:43 AM   Modules accepted: Orders

## 2012-09-08 NOTE — Patient Instructions (Addendum)
-  We have ordered labs or studies at this visit. It can take up to 1-2 weeks for results and processing. We will contact you with instructions IF your results are abnormal. Normal results will be released to your [facility]. If you have not heard from [place] or can not find your results in [facility] in 2 weeks please contact our office.  -PLEASE SIGN UP FOR MYCHART TODAY   We recommend the following healthy lifestyle measures: - eat a healthy diet consisting of lots of vegetables, fruits, beans, nuts, seeds, healthy meats such as white chicken and fish and whole grains.  - avoid fried foods, fast food, processed foods, sodas, red meet and other fattening foods.  - get a least 150 minutes of aerobic exercise per week.   See audiologist at [facility]  Keep blood pressure log   Follow up in: 3-4 months or as needed

## 2012-09-09 NOTE — Progress Notes (Signed)
Quick Note:  Called and spoke with pt and pt is aware. ______ 

## 2012-09-09 NOTE — Progress Notes (Signed)
Quick Note:  Attempted to call pt; no vm. Will attempt to call at a later time. ______

## 2012-09-12 ENCOUNTER — Encounter: Payer: Self-pay | Admitting: Family Medicine

## 2012-09-12 ENCOUNTER — Ambulatory Visit (INDEPENDENT_AMBULATORY_CARE_PROVIDER_SITE_OTHER): Payer: Medicare Other | Admitting: Family Medicine

## 2012-09-12 VITALS — BP 140/72 | HR 92 | Wt 114.0 lb

## 2012-09-12 DIAGNOSIS — S92351A Displaced fracture of fifth metatarsal bone, right foot, initial encounter for closed fracture: Secondary | ICD-10-CM

## 2012-09-12 DIAGNOSIS — S92309A Fracture of unspecified metatarsal bone(s), unspecified foot, initial encounter for closed fracture: Secondary | ICD-10-CM

## 2012-09-12 NOTE — Assessment & Plan Note (Signed)
Patient is showing some signs of healing. With patient's past medical history ago of polymyalgia rheumatica, vitamin D deficiency, gout, vitamin D deficiency and chronic prednisone use this likely will take 12 weeks for healing. I did not get an x-ray today because I was not expecting a significant amount of change. Ultrasound today did confirm you're having some mild healing but not enough that would be worn for an x-ray. Patient will continue the postop boot, aid of a cane, as well as vitamin D supplementation. Patient and will come back again in 4 weeks. At that time we'll x-ray and rescan by ultrasound.

## 2012-09-12 NOTE — Progress Notes (Signed)
  Subjective:    CC: Followup for fracture of fifth metatarsal bone.  HPI: Patient is a very pleasant 79 year old female who is following up for fracture of the fifth metatarsal bone in the right foot. Patient states that the pain is getting better day by day. Patient does have a past medical history significant for chronic prednisone use as well as PMR. Patient denies any new symptoms such as swelling, fevers or chills, or any numbness in the toe. Patient is to ambulate with the aid of a cane. Patient is taking 1800 mg of vitamin D daily.  Past medical history, Surgical history, Family history not pertinant except as noted below, Social history, Allergies, and medications have been entered into the medical record, reviewed, and no changes needed.   Review of Systems: No fevers, chills, night sweats, weight loss, chest pain, or shortness of breath.   Objective:   Blood pressure 140/72, pulse 92, weight 114 lb (51.71 kg), SpO2 98.00%.  General: No apparent distress alert and oriented x3 mood and affect normal, dressed appropriately.  HEENT: Pupils equal, extraocular movements intact does have cataract replace  Respiratory: Patient's speak in full sentences and does not appear short of breath  Cardiovascular: No lower extremity edema, non tender, no erythema  Skin: Warm dry intact with no signs of infection or rash on extremities or on axial skeleton. Patient does have some bruising lower legs Abdomen: Soft nontender  Neuro: Cranial nerves II through XII are intact, neurovascularly intact in all extremities with 2+ DTRs and 2+ pulses.  Lymph: No lymphadenopathy of posterior or anterior cervical chain or axillae bilaterally.  Gait antalgic with walker protecting right foot with good balance and coordination.  MSK: Non tender with full range of motion and good stability and symmetric strength and tone of shoulders, elbows, wrist, knee bilaterally.  Right foot exam shows that she does not have  effusion over the lateral aspect of the midfoot anymore. She is tender to palpation at the base of the fifth metatarsal. She is neurovascularly intact distally and can move all toes. She is nontender over the medial malleolus or lateral malleolus. Nontender over the fibular head.  Left foot is unremarkable  Muscular skeletal ultrasound was performed and interpreted by me today. Limited ultrasound did show that patient still has fracture over the lateral aspect of the fifth metatarsal. This does show very minimal healing. Mild callus formation. There is some neovascularization in this area. Impression and Recommendations:   Spent greater than 25 minutes with patient face-to-face and had greater than 50% of counseling including as described above in assessment and plan.

## 2012-09-15 ENCOUNTER — Telehealth: Payer: Self-pay | Admitting: Family Medicine

## 2012-09-15 NOTE — Telephone Encounter (Signed)
Would advise OTC tx first as this is very effective and has less side effects.

## 2012-09-15 NOTE — Telephone Encounter (Signed)
Pt dentist put her on abx and she has yeast infection. Pt call diflucan into [facility]/[address]

## 2012-09-15 NOTE — Telephone Encounter (Signed)
Called and spoke with pt and pt is aware.  

## 2012-10-02 ENCOUNTER — Other Ambulatory Visit (HOSPITAL_COMMUNITY)
Admission: RE | Admit: 2012-10-02 | Discharge: 2012-10-02 | Disposition: A | Discharge status: Home / Self Care | Payer: Medicare Other | Source: Ambulatory Visit | Attending: Family Medicine | Admitting: Family Medicine

## 2012-10-02 ENCOUNTER — Encounter: Payer: Self-pay | Admitting: Family Medicine

## 2012-10-02 ENCOUNTER — Ambulatory Visit (INDEPENDENT_AMBULATORY_CARE_PROVIDER_SITE_OTHER): Payer: Medicare Other | Admitting: Family Medicine

## 2012-10-02 VITALS — BP 124/80 | Temp 98.5°F | Wt 104.0 lb

## 2012-10-02 DIAGNOSIS — R300 Dysuria: Secondary | ICD-10-CM | POA: Diagnosis not present

## 2012-10-02 DIAGNOSIS — L293 Anogenital pruritus, unspecified: Secondary | ICD-10-CM | POA: Diagnosis not present

## 2012-10-02 DIAGNOSIS — Z23 Encounter for immunization: Secondary | ICD-10-CM

## 2012-10-02 DIAGNOSIS — N760 Acute vaginitis: Secondary | ICD-10-CM | POA: Insufficient documentation

## 2012-10-02 DIAGNOSIS — L292 Pruritus vulvae: Secondary | ICD-10-CM

## 2012-10-02 LAB — POCT URINALYSIS DIPSTICK
Bilirubin, UA: NEGATIVE
Nitrite, UA: NEGATIVE
pH, UA: 5.5

## 2012-10-02 MED ORDER — NYSTATIN 100000 UNIT/GM EX CREA: TOPICAL_CREAM | Freq: Two times a day (BID) | CUTANEOUS | Status: DC

## 2012-10-02 MED ORDER — CIPROFLOXACIN HCL 500 MG PO TABS: 500.0000 mg | ORAL_TABLET | Freq: Two times a day (BID) | ORAL | Status: DC

## 2012-10-02 NOTE — Progress Notes (Signed)
Chief Complaint  Patient presents with  . Urinary Tract Infection    dysuria     HPI:  Vulvovaginal pruritis: -for several days -no vaginal discharge -tried monistat - didn't work  Dysuria: -started a few days ago -symptoms: a little urgency, a little dysuria -has had uti before -denies: fevers, chills, flank pain, pelvic pain, abd pain, NVD  ROS: See pertinent positives and negatives per HPI.  Past Medical History  Diagnosis Date  . VITAMIN D DEFICIENCY 06/30/2009  . HYPERLIPIDEMIA 03/27/2007  . Pernicious anemia 10/25/2006  . HYPERTENSION 07/12/2006  . ABSCESS, TOOTH 11/14/2009  . Polymyalgia rheumatica 03/27/2007  . OSTEOPOROSIS 07/12/2006  . Alcohol abuse   . EMPHYSEMA 01/26/2009    pt. unsure of this reports as of 07/31/2012  . Arthritis     polymyalgia rhuematica , all over her body  . Gout 08/11/2012    Past Surgical History  Procedure Laterality Date  . Appendectomy  07/12/2006  . Partial hip arthroplasty      left side- 2x's ( 2nd surgery to correct 1st replacement), R side -   . Tooth extraction N/A 08/04/2012    Procedure: EXTRACTIONS 17, 20;  Surgeon: Georgia Lopes, DDS;  Location: [facility] OR;  Service: Oral Surgery;  Laterality: N/A;    Family History  Problem Relation Age of Onset  . Mental illness Mother     alzheimer dementia  . Ulcers Father   . Heart disease Father   . Cancer Father     lung, smoker    History   Social History  . Marital Status: Married    Spouse Name: N/A    Number of Children: N/A  . Years of Education: N/A   Social History Main Topics  . Smoking status: Former Smoker    Quit date: 07/12/2006  . Smokeless tobacco: None  . Alcohol Use: 6.0 oz/week    10 Shots of liquor per week     Comment: quit completely - 2012  . Drug Use: No  . Sexual Activity: No   Other Topics Concern  . None   Social History Narrative   Work or School: none      Home Situation: lives with husband - drives, husband takes care of finances, dresses  herself, takes care of housework and cooking      Spiritual Beliefs: [employer]      Lifestyle: uses cane when goes out, exercises sometimes - diet healthy             Current outpatient prescriptions:alendronate (FOSAMAX) 70 MG tablet, Take 70 mg by mouth every 7 (seven) days. , Disp: , Rfl: ;  amLODipine (NORVASC) 5 MG tablet, Take 1 tablet (5 mg total) by mouth daily., Disp: 90 tablet, Rfl: 3;  cyanocobalamin (,VITAMIN B-12,) 1000 MCG/ML injection, Inject 1 mL (1,000 mcg total) into the muscle every 30 (thirty) days., Disp: 25 mL, Rfl: 1 ciprofloxacin (CIPRO) 500 MG tablet, Take 1 tablet (500 mg total) by mouth 2 (two) times daily., Disp: 6 tablet, Rfl: 0;  nystatin cream (MYCOSTATIN), Apply topically 2 (two) times daily., Disp: 30 g, Rfl: 0;  predniSONE (DELTASONE) 5 MG tablet, Take 2.5 mg by mouth every other day. , Disp: , Rfl:   EXAM:  Filed Vitals:   10/02/12 1031  BP: 124/80  Temp: 98.5 F (36.9 C)    Body mass index is 19.97 kg/(m^2).  GENERAL: vitals reviewed and listed above, alert, oriented, appears well hydrated and in no acute distress  HEENT:  atraumatic, conjunttiva clear, no obvious abnormalities on inspection of external nose and ears  NECK: no obvious masses on inspection  ABD: soft, NTTP, no CVA TTP  GU: yeast rash on labia, normal otherwise  MS: moves all extremities without noticeable abnormality  PSYCH: pleasant and cooperative, no obvious depression or anxiety  ASSESSMENT AND PLAN:  Discussed the following assessment and plan:  Dysuria - Plan: POCT urinalysis dipstick, ciprofloxacin (CIPRO) 500 MG tablet  Vulvovaginal pruritus - Plan: Cervicovaginal ancillary only, nystatin cream (MYCOSTATIN)  -udip c/w UTI, abx sent - Recommendations per orders and instructions, risks and use of medications and return precautions discussed. -wet prep pending -nystatin cream for yeast infection and she is to call if does not resolve -Patient advised to  return or notify a doctor immediately if symptoms worsen or persist or new concerns arise.  There are no Patient Instructions on file for this visit.   [name]

## 2012-10-02 NOTE — Addendum Note (Signed)
Addended by: Earle Gell C on: 10/02/2012 11:00 AM   Modules accepted: Orders

## 2012-10-04 LAB — URINE CULTURE

## 2012-10-07 NOTE — Progress Notes (Signed)
Quick Note:  Called and spoke with pt and pt states yeast infection is better. ______

## 2012-10-10 ENCOUNTER — Ambulatory Visit (INDEPENDENT_AMBULATORY_CARE_PROVIDER_SITE_OTHER): Payer: Medicare Other | Admitting: Family Medicine

## 2012-10-10 ENCOUNTER — Encounter: Payer: Self-pay | Admitting: Family Medicine

## 2012-10-10 VITALS — BP 120/64 | HR 74 | Wt 114.0 lb

## 2012-10-10 DIAGNOSIS — S92309A Fracture of unspecified metatarsal bone(s), unspecified foot, initial encounter for closed fracture: Secondary | ICD-10-CM

## 2012-10-10 DIAGNOSIS — S92351A Displaced fracture of fifth metatarsal bone, right foot, initial encounter for closed fracture: Secondary | ICD-10-CM

## 2012-10-10 NOTE — Assessment & Plan Note (Signed)
Patient is now 6 weeks out from injury and is doing very well. Patient is a female without a significant limp. Discussed with patient secondary to her polymyalgia rheumatica, chronic steroid use, and vitamin D deficiency this likely will take somewhere around 12-24 weeks of healing. We will see patient back again in 4 weeks for further evaluation. At that time I will re\re ultrasound the area to compare. As long as patient is doing well at that time we will consider getting patient back into a normal shoe. Patient declined anything for pain medication was today.

## 2012-10-10 NOTE — Progress Notes (Signed)
  Subjective:    CC: Right fifth metatarsal fracture  HPI: Patient is here for followup of her fracture. Patient is doing better. Patient has been able to stimulate continuing the boot as well as with the aid of a walker. Patient continue to take calcium and vitamin D. Patient states that the pain is approximately 50-60% better. Patient states she walks too far she does have some dull aching sensation. Patient denies any new symptoms such as numbness or swelling. Overall she is happy with her results.  Past medical history, Surgical history, Family history not pertinant except as noted below, Social history, Allergies, and medications have been entered into the medical record, reviewed, and no changes needed.   Review of Systems: No fevers, chills, night sweats, weight loss, chest pain, or shortness of breath.   Objective:   Blood pressure 120/64, pulse 74, weight 114 lb (51.71 kg).  General: Well Developed, well nourished, and in no acute distress.  Neuro: Alert and oriented x3, extra-ocular muscles intact, sensation grossly intact.  HEENT: Normocephalic, atraumatic, pupils equal round reactive to light, neck supple, no masses, no lymphadenopathy, thyroid nonpalpable.  Skin: Warm and dry, no rashes. Cardiac:  no lower extremity edema. Respiratory: Not using accessory muscles, speaking in full sentences. Abdominal: NT, soft Gait: Nonantlagic, good balance and coordination Lymphatic: no lymphadenopathy in neck or axillae on palpation, non tender.  Musculoskeletal: Inspection and palpation of the right and left upper extremities including the shoulders elbows and wrist are unremarkable with full range of motion and good muscle strength and tone. Inspection and palpation of the right and left lower extremities including the hips knees and ankles are unremarkable and nontender with full range of motion and good muscle strength and tone and are symmetric. Right foot exam shows no swelling.  Patient is still moderately tender at the base of the fifth metatarsal. There is what appears to be a callus formation. She is neurovascularly intact distally. Contralateral foot exam is unremarkable  Muscular skeletal ultrasound was performed and interpreted by me today. Patient's fracture of the fifth metatarsal is showing some callus formation. This approximately bridges the fracture by 60-70%. Is still appears to be mostly a soft callus with some mild calcium deposits. This does seem a little slow for what I would anticipate but secondary to patient's chronic for morbidity this likely will take somewhat longer to heal. Impression slowly healing fifth metatarsal fracture  Impression and Recommendations:

## 2012-10-10 NOTE — Patient Instructions (Signed)
Good to see you Continue the shoe for the next 4 weeks.  Continue calcium and vit D Come back in 4 weeks and we will ultrasound it again.  You can drive in a regular shoe, but change to boot to walk.

## 2012-10-22 ENCOUNTER — Telehealth: Payer: Self-pay | Admitting: Family Medicine

## 2012-10-22 NOTE — Telephone Encounter (Signed)
Pt has question concerning b12 medication.

## 2012-10-22 NOTE — Telephone Encounter (Signed)
Called and to spoke with pt and pt states she gives b12 shots every month.  Pt wonders if needles are defective.  Come into office and bring supplies. Pt will come in 10/2

## 2012-10-23 ENCOUNTER — Ambulatory Visit: Payer: Medicare Other

## 2012-10-23 DIAGNOSIS — E559 Vitamin D deficiency, unspecified: Secondary | ICD-10-CM

## 2012-10-23 NOTE — Patient Instructions (Signed)
Pt came into the office due to having trouble giving b12 injections at home.  Performed demonstration for patient with her supplies.  No B12 was given today.  Pt was having trouble drawing up the B12 into the syringe.  Pt verbalized understanding with drawing up b12 medication.

## 2012-11-04 ENCOUNTER — Ambulatory Visit: Payer: Medicare Other | Admitting: Family Medicine

## 2012-11-06 ENCOUNTER — Ambulatory Visit (INDEPENDENT_AMBULATORY_CARE_PROVIDER_SITE_OTHER): Payer: Medicare Other | Admitting: Family Medicine

## 2012-11-06 ENCOUNTER — Encounter: Payer: Self-pay | Admitting: Family Medicine

## 2012-11-06 VITALS — BP 130/70 | HR 88 | Wt 113.0 lb

## 2012-11-06 DIAGNOSIS — S92309A Fracture of unspecified metatarsal bone(s), unspecified foot, initial encounter for closed fracture: Secondary | ICD-10-CM | POA: Diagnosis not present

## 2012-11-06 DIAGNOSIS — S92351A Displaced fracture of fifth metatarsal bone, right foot, initial encounter for closed fracture: Secondary | ICD-10-CM

## 2012-11-06 NOTE — Assessment & Plan Note (Signed)
Healed at this time. Patient can switch to normal shoes. Patient knows if she has any discomfort over the course of time or seems to be worsening she will come back immediately. Otherwise she will followup on an as-needed basis.   encourage her to continue calcium and vitamin D

## 2012-11-06 NOTE — Progress Notes (Signed)
  Subjective:    CC: Right fifth metatarsal fracture follow up  HPI: Patient is here for followup of her fracture. Patient is doing better. Patient has been able to ambulate in the boot an dno longer using a walker.. Patient continue to take calcium and vitamin D. Patient states no pain. Able to ambulate ansd drive with no trouble.  Still not in a regular shoe.  Patient denies any new symptoms such as numbness or swelling. Overall she is happy with her results.  Past medical history, Surgical history, Family history not pertinant except as noted below, Social history, Allergies, and medications have been entered into the medical record, reviewed, and no changes needed.   Review of Systems: No fevers, chills, night sweats, weight loss, chest pain, or shortness of breath.   Objective:   There were no vitals taken for this visit.  General: Well Developed, well nourished, and in no acute distress.  Neuro: Alert and oriented x3, extra-ocular muscles intact, sensation grossly intact.  HEENT: Normocephalic, atraumatic, pupils equal round reactive to light, neck supple, no masses, no lymphadenopathy, thyroid nonpalpable.  Skin: Warm and dry, no rashes. Cardiac:  no lower extremity edema. Respiratory: Not using accessory muscles, speaking in full sentences. Abdominal: NT, soft Gait: Nonantlagic, good balance and coordination Lymphatic: no lymphadenopathy in neck or axillae on palpation, non tender.  Musculoskeletal: Inspection and palpation of the right and left upper extremities including the shoulders elbows and wrist are unremarkable with full range of motion and good muscle strength and tone. Inspection and palpation of the right and left lower extremities including the hips knees and ankles are unremarkable and nontender with full range of motion and good muscle strength and tone and are symmetric. Right foot exam shows no swelling. Non tender over previous area where fracture was.  Neurovascularly intact distally. She is neurovascularly intact distally. Contralateral foot exam is unremarkable  Muscular skeletal ultrasound was performed and interpreted by me today.  Patient's fracture of the fifth metatarsal isn ear full healing. No hypoechoic areas.  Hard callus is formed.  Impression healed fifth metatarsal fracture  Impression and Recommendations:

## 2012-11-07 ENCOUNTER — Ambulatory Visit: Payer: Medicare Other | Admitting: Family Medicine

## 2012-12-16 DIAGNOSIS — H2510 Age-related nuclear cataract, unspecified eye: Secondary | ICD-10-CM | POA: Diagnosis not present

## 2012-12-16 DIAGNOSIS — H25019 Cortical age-related cataract, unspecified eye: Secondary | ICD-10-CM | POA: Diagnosis not present

## 2012-12-23 ENCOUNTER — Telehealth: Payer: Self-pay

## 2012-12-23 DIAGNOSIS — D510 Vitamin B12 deficiency anemia due to intrinsic factor deficiency: Secondary | ICD-10-CM

## 2012-12-23 DIAGNOSIS — I10 Essential (primary) hypertension: Secondary | ICD-10-CM

## 2012-12-23 MED ORDER — CYANOCOBALAMIN 1000 MCG/ML IJ SOLN: 1000.0000 ug | INTRAMUSCULAR | Status: DC

## 2012-12-23 NOTE — Telephone Encounter (Signed)
Pt called and states she needs a refill on B12.  Pt was switched from metoprolol to amlodipine.  Since being switched pt has had swelling in there feet and ankles that worsens.  Should pt get back on metoprolol? Rx for b12 sent to pharmacy.

## 2013-01-01 ENCOUNTER — Telehealth: Payer: Self-pay

## 2013-01-01 DIAGNOSIS — I10 Essential (primary) hypertension: Secondary | ICD-10-CM

## 2013-01-01 DIAGNOSIS — D510 Vitamin B12 deficiency anemia due to intrinsic factor deficiency: Secondary | ICD-10-CM

## 2013-01-01 MED ORDER — CYANOCOBALAMIN 1000 MCG/ML IJ SOLN: 1000.0000 ug | INTRAMUSCULAR | Status: DC

## 2013-01-01 NOTE — Telephone Encounter (Signed)
It looks like she has been on the norvasc since April - started with Dr Tawanna Cooler. I would advise office visit to check blood pressure and discuss options - we could see her tomorrow. You can use a same day slot.

## 2013-01-01 NOTE — Telephone Encounter (Signed)
Called and spoke with pt and she will come in on 01/02/13 at 3:45 am.

## 2013-01-01 NOTE — Telephone Encounter (Signed)
Pt called and states she needs a refill on B12. Pt was switched from metoprolol to amlodipine. Since being switched pt has had swelling in there feet and ankles that worsens. Should pt get back on metoprolol?  Pt states she went to her specialist and was told that this medication can do this.  Can pt switch?

## 2013-01-02 ENCOUNTER — Encounter: Payer: Self-pay | Admitting: Family Medicine

## 2013-01-02 ENCOUNTER — Ambulatory Visit (INDEPENDENT_AMBULATORY_CARE_PROVIDER_SITE_OTHER): Payer: Medicare Other | Admitting: Family Medicine

## 2013-01-02 VITALS — BP 130/66 | HR 92 | Temp 98.0°F | Resp 18 | Wt 112.0 lb

## 2013-01-02 DIAGNOSIS — I10 Essential (primary) hypertension: Secondary | ICD-10-CM

## 2013-01-02 MED ORDER — HYDROCHLOROTHIAZIDE 12.5 MG PO TABS: 12.5000 mg | ORAL_TABLET | Freq: Every day | ORAL | Status: DC

## 2013-01-02 NOTE — Progress Notes (Signed)
Chief Complaint  Patient presents with  . Follow-up    HPI:  Follow up HTN: -[facility] doc told her norvasc may be contributing to her chronic LE edema - mild edema -on norvasc since April 2014 from prior PCP -had stopped metoprolol per her request -denies:CP, SOB, palpitations  ROS: See pertinent positives and negatives per HPI.  Past Medical History  Diagnosis Date  . VITAMIN D DEFICIENCY 06/30/2009  . HYPERLIPIDEMIA 03/27/2007  . Pernicious anemia 10/25/2006  . HYPERTENSION 07/12/2006  . ABSCESS, TOOTH 11/14/2009  . Polymyalgia rheumatica 03/27/2007  . OSTEOPOROSIS 07/12/2006  . Alcohol abuse   . EMPHYSEMA 01/26/2009    pt. unsure of this reports as of 07/31/2012  . Arthritis     polymyalgia rhuematica , all over her body  . Gout 08/11/2012    Past Surgical History  Procedure Laterality Date  . Appendectomy  07/12/2006  . Partial hip arthroplasty      left side- 2x's ( 2nd surgery to correct 1st replacement), R side -   . Tooth extraction N/A 08/04/2012    Procedure: EXTRACTIONS 17, 20;  Surgeon: Georgia Lopes, DDS;  Location: [facility] OR;  Service: Oral Surgery;  Laterality: N/A;    Family History  Problem Relation Age of Onset  . Mental illness Mother     alzheimer dementia  . Ulcers Father   . Heart disease Father   . Cancer Father     lung, smoker    History   Social History  . Marital Status: Married    Spouse Name: N/A    Number of Children: N/A  . Years of Education: N/A   Social History Main Topics  . Smoking status: Former Smoker    Quit date: 07/12/2006  . Smokeless tobacco: None  . Alcohol Use: 6.0 oz/week    10 Shots of liquor per week     Comment: quit completely - 2012  . Drug Use: No  . Sexual Activity: No   Other Topics Concern  . None   Social History Narrative   Work or School: none      Home Situation: lives with husband - drives, husband takes care of finances, dresses herself, takes care of housework and cooking      Spiritual Beliefs:  [employer]      Lifestyle: uses cane when goes out, exercises sometimes - diet healthy             Current outpatient prescriptions:alendronate (FOSAMAX) 70 MG tablet, Take 70 mg by mouth every 7 (seven) days. , Disp: , Rfl: ;  cyanocobalamin (,VITAMIN B-12,) 1000 MCG/ML injection, Inject 1 mL (1,000 mcg total) into the muscle every 30 (thirty) days., Disp: 30 mL, Rfl: 0;  nystatin cream (MYCOSTATIN), Apply topically 2 (two) times daily., Disp: 30 g, Rfl: 0;  predniSONE (DELTASONE) 5 MG tablet, Take 2.5 mg by mouth daily. , Disp: , Rfl:  hydrochlorothiazide (HYDRODIURIL) 12.5 MG tablet, Take 1 tablet (12.5 mg total) by mouth daily., Disp: 90 tablet, Rfl: 3  EXAM:  Filed Vitals:   01/02/13 1546  BP: 130/66  Pulse: 92  Temp: 98 F (36.7 C)  Resp: 18    Body mass index is 21.51 kg/(m^2).  GENERAL: vitals reviewed and listed above, alert, oriented, appears well hydrated and in no acute distress  HEENT: atraumatic, conjunttiva clear, no obvious abnormalities on inspection of external nose and ears  NECK: no obvious masses on inspection  LUNGS: clear to auscultation bilaterally, no wheezes, rales or rhonchi,  good air movement  CV: HRRR, no peripheral edema  MS: moves all extremities without noticeable abnormality  PSYCH: pleasant and cooperative, no obvious depression or anxiety  ASSESSMENT AND PLAN:  Discussed the following assessment and plan:  HYPERTENSION - Plan: hydrochlorothiazide (HYDRODIURIL) 12.5 MG tablet  -discussed options for her BP management as she wants to stop the norvasc -will start HCTZ after discussion risks -Patient advised to return or notify a doctor immediately if symptoms worsen or persist or new concerns arise.  Patient Instructions  -stop the norvasc  -start the hydrochlorothiazide  -follow up in 1 month     Jenipher Havel R.

## 2013-01-02 NOTE — Patient Instructions (Signed)
-  stop the norvasc  -start the hydrochlorothiazide  -follow up in 1 month

## 2013-01-02 NOTE — Progress Notes (Signed)
Pre-visit discussion using our clinic review tool. No additional management support is needed unless otherwise documented below in the visit note.  

## 2013-01-19 ENCOUNTER — Telehealth: Payer: Self-pay

## 2013-01-19 NOTE — Telephone Encounter (Signed)
Pt called and states she got a fluid pill from [facility] and wanted to know if the blood pressure pill was ordered as well.  Advised pt that HCTZ can be used for both.  Pt verbalized understanding.

## 2013-02-03 DIAGNOSIS — M67919 Unspecified disorder of synovium and tendon, unspecified shoulder: Secondary | ICD-10-CM | POA: Diagnosis not present

## 2013-02-03 DIAGNOSIS — M719 Bursopathy, unspecified: Secondary | ICD-10-CM | POA: Diagnosis not present

## 2013-02-03 DIAGNOSIS — M353 Polymyalgia rheumatica: Secondary | ICD-10-CM | POA: Diagnosis not present

## 2013-02-03 DIAGNOSIS — Z79899 Other long term (current) drug therapy: Secondary | ICD-10-CM | POA: Diagnosis not present

## 2013-02-03 DIAGNOSIS — M810 Age-related osteoporosis without current pathological fracture: Secondary | ICD-10-CM | POA: Diagnosis not present

## 2013-02-10 ENCOUNTER — Encounter: Payer: Self-pay | Admitting: Family Medicine

## 2013-02-10 NOTE — Progress Notes (Signed)
Received office notes from Dr. Truslow RheumatKellie Simmeringology from visit on 02/03/2013.  CC:  PMR, shoulders, HTN;  Pt's weight down 5 lbs.  Continue Alendronate 70 mg; for PMR pt is lowering prednisone; Dr. Kellie Simmeringruslow suggested that pt not do this at this point but pt can lower to 2.5mg  q.o.d. In late March.  Pt has right shoulder tendinitis, this is mild presently.  Pt given instructional sheet for exercises.  Pt to return in 5 months.

## 2013-03-18 DIAGNOSIS — Z0279 Encounter for issue of other medical certificate: Secondary | ICD-10-CM

## 2013-03-22 DIAGNOSIS — S63509A Unspecified sprain of unspecified wrist, initial encounter: Secondary | ICD-10-CM | POA: Diagnosis not present

## 2013-03-23 ENCOUNTER — Telehealth: Payer: Self-pay

## 2013-03-23 MED ORDER — TRAMADOL HCL 50 MG PO TABS: 50.0000 mg | ORAL_TABLET | Freq: Three times a day (TID) | ORAL | Status: DC | PRN

## 2013-03-23 NOTE — Telephone Encounter (Signed)
Pt walked into office to pick up handicap form and was informed of Dr. Elmyra RicksKim's recommendations. Rx called in to [facility].  Pt states that she is suppose to have another x ray in 2 weeks bc they are thinking it will be like her last xray where they could not see the fracture.

## 2013-03-23 NOTE — Telephone Encounter (Signed)
Pt called and states [facility] and was told an x-ray was taken on her hand and she was told her hand was not broken or fractured but pt states she is in a lot of pain. Pt will go back in 2 weeks for a follow up x-ray.  Pt states she went outside on late Thursday evening and was trying to shovel snow and her hand started hurting her and it got worse over the weekend. Pt states [facility] gave her a brace for her hand but the PA did not give her pain medication. Pt states she has been taking Tylenol extra strength but it is not working.  Pt stated the Tramadol worked last time.  Pls advise.

## 2013-03-23 NOTE — Telephone Encounter (Signed)
Allergies to codeine/hydrocone - but I think she tolerated tramadol? Make sure before sending rx. Thanks. She should follow up with ortho if worsening or continued uncontrolled pain.

## 2013-03-27 ENCOUNTER — Ambulatory Visit (INDEPENDENT_AMBULATORY_CARE_PROVIDER_SITE_OTHER): Payer: Medicare Other | Admitting: Family Medicine

## 2013-03-27 ENCOUNTER — Encounter: Payer: Self-pay | Admitting: Family Medicine

## 2013-03-27 VITALS — BP 102/78 | Temp 98.7°F | Wt 104.0 lb

## 2013-03-27 DIAGNOSIS — M791 Myalgia, unspecified site: Secondary | ICD-10-CM

## 2013-03-27 DIAGNOSIS — IMO0001 Reserved for inherently not codable concepts without codable children: Secondary | ICD-10-CM

## 2013-03-27 NOTE — Patient Instructions (Addendum)
-  gentle activity  -heat 15 minutes twice daily  -ultram as needed for the pain or topical sports creams

## 2013-03-27 NOTE — Progress Notes (Signed)
Pre visit review using our clinic review tool, if applicable. No additional management support is needed unless otherwise documented below in the visit note. 

## 2013-03-27 NOTE — Progress Notes (Signed)
Chief Complaint  Patient presents with  . left shoulder and elbow pain    HPI:  Acute visit for shoulder pain: -shoveling snow last week and hurt Hand and saw ortho about this -has developed muscle soreness in bilat upper arms since, mild-mod achy -ortho advised tendonitis - repeating the xrays in 2 weeks but there was not fracture -denies: fevers, malaise, weakness, numbness   ROS: See pertinent positives and negatives per HPI.  Past Medical History  Diagnosis Date  . VITAMIN D DEFICIENCY 06/30/2009  . HYPERLIPIDEMIA 03/27/2007  . Pernicious anemia 10/25/2006  . HYPERTENSION 07/12/2006  . ABSCESS, TOOTH 11/14/2009  . Polymyalgia rheumatica 03/27/2007  . OSTEOPOROSIS 07/12/2006  . Alcohol abuse   . EMPHYSEMA 01/26/2009    pt. unsure of this reports as of 07/31/2012  . Arthritis     polymyalgia rhuematica , all over her body  . Gout 08/11/2012    Past Surgical History  Procedure Laterality Date  . Appendectomy  07/12/2006  . Partial hip arthroplasty      left side- 2x's ( 2nd surgery to correct 1st replacement), R side -   . Tooth extraction N/A 08/04/2012    Procedure: EXTRACTIONS 17, 20;  Surgeon: Georgia LopesScott M Jensen, DDS;  Location: [facility] OR;  Service: Oral Surgery;  Laterality: N/A;    Family History  Problem Relation Age of Onset  . Mental illness Mother     alzheimer dementia  . Ulcers Father   . Heart disease Father   . Cancer Father     lung, smoker    History   Social History  . Marital Status: Married    Spouse Name: N/A    Number of Children: N/A  . Years of Education: N/A   Social History Main Topics  . Smoking status: Former Smoker    Quit date: 07/12/2006  . Smokeless tobacco: None  . Alcohol Use: 6.0 oz/week    10 Shots of liquor per week     Comment: quit completely - 2012  . Drug Use: No  . Sexual Activity: No   Other Topics Concern  . None   Social History Narrative   Work or School: none      Home Situation: lives with husband - drives, husband  takes care of finances, dresses herself, takes care of housework and cooking      Spiritual Beliefs: [employer]      Lifestyle: uses cane when goes out, exercises sometimes - diet healthy             Current outpatient prescriptions:alendronate (FOSAMAX) 70 MG tablet, Take 70 mg by mouth every 7 (seven) days. , Disp: , Rfl: ;  cyanocobalamin (,VITAMIN B-12,) 1000 MCG/ML injection, Inject 1 mL (1,000 mcg total) into the muscle every 30 (thirty) days., Disp: 30 mL, Rfl: 0;  hydrochlorothiazide (HYDRODIURIL) 12.5 MG tablet, Take 1 tablet (12.5 mg total) by mouth daily., Disp: 90 tablet, Rfl: 3 nystatin cream (MYCOSTATIN), Apply topically 2 (two) times daily., Disp: 30 g, Rfl: 0;  predniSONE (DELTASONE) 5 MG tablet, Take 2.5 mg by mouth daily. , Disp: , Rfl: ;  traMADol (ULTRAM) 50 MG tablet, Take 1 tablet (50 mg total) by mouth every 8 (eight) hours as needed., Disp: 20 tablet, Rfl: 0  EXAM:  Filed Vitals:   03/27/13 1514  BP: 102/78  Temp: 98.7 F (37.1 C)    Body mass index is 19.97 kg/(m^2).  GENERAL: vitals reviewed and listed above, alert, oriented, appears well hydrated and in  no acute distress  HEENT: atraumatic, conjunttiva clear, no obvious abnormalities on inspection of external nose and ears  NECK: no obvious masses on inspection  LUNGS: clear to auscultation bilaterally, no wheezes, rales or rhonchi, good air movement  CV: HRRR, no peripheral edema  MS: moves all extremities without noticeable abnormality, sling on R forearm and hand, TTP deltoid and biceps, muscle strength and sensation to touch intact, nv intact  PSYCH: pleasant and cooperative, no obvious depression or anxiety  ASSESSMENT AND PLAN:  Discussed the following assessment and plan:  Muscle soreness  -Patient advised to return or notify a doctor immediately if symptoms worsen or persist or new concerns arise.  Patient Instructions  -gentle activity  -heat 15 minutes twice daily  -ultram  as needed for the pain     [name], [name]

## 2013-04-06 DIAGNOSIS — M19049 Primary osteoarthritis, unspecified hand: Secondary | ICD-10-CM | POA: Diagnosis not present

## 2013-04-27 DIAGNOSIS — M19049 Primary osteoarthritis, unspecified hand: Secondary | ICD-10-CM | POA: Diagnosis not present

## 2013-06-22 DIAGNOSIS — H35379 Puckering of macula, unspecified eye: Secondary | ICD-10-CM | POA: Diagnosis not present

## 2013-06-22 DIAGNOSIS — H2510 Age-related nuclear cataract, unspecified eye: Secondary | ICD-10-CM | POA: Diagnosis not present

## 2013-06-22 DIAGNOSIS — H25019 Cortical age-related cataract, unspecified eye: Secondary | ICD-10-CM | POA: Diagnosis not present

## 2013-06-29 DIAGNOSIS — M67919 Unspecified disorder of synovium and tendon, unspecified shoulder: Secondary | ICD-10-CM | POA: Diagnosis not present

## 2013-06-29 DIAGNOSIS — Z79899 Other long term (current) drug therapy: Secondary | ICD-10-CM | POA: Diagnosis not present

## 2013-06-29 DIAGNOSIS — M810 Age-related osteoporosis without current pathological fracture: Secondary | ICD-10-CM | POA: Diagnosis not present

## 2013-06-29 DIAGNOSIS — M719 Bursopathy, unspecified: Secondary | ICD-10-CM | POA: Diagnosis not present

## 2013-06-29 DIAGNOSIS — M353 Polymyalgia rheumatica: Secondary | ICD-10-CM | POA: Diagnosis not present

## 2013-07-02 ENCOUNTER — Encounter: Payer: Self-pay | Admitting: *Deleted

## 2013-07-21 DIAGNOSIS — H2510 Age-related nuclear cataract, unspecified eye: Secondary | ICD-10-CM | POA: Diagnosis not present

## 2013-07-21 DIAGNOSIS — H25019 Cortical age-related cataract, unspecified eye: Secondary | ICD-10-CM | POA: Diagnosis not present

## 2013-07-21 DIAGNOSIS — H2589 Other age-related cataract: Secondary | ICD-10-CM | POA: Diagnosis not present

## 2013-08-18 DIAGNOSIS — H2589 Other age-related cataract: Secondary | ICD-10-CM | POA: Diagnosis not present

## 2013-08-18 DIAGNOSIS — H21569 Pupillary abnormality, unspecified eye: Secondary | ICD-10-CM | POA: Diagnosis not present

## 2013-08-18 DIAGNOSIS — H2181 Floppy iris syndrome: Secondary | ICD-10-CM | POA: Diagnosis not present

## 2013-08-18 DIAGNOSIS — H25019 Cortical age-related cataract, unspecified eye: Secondary | ICD-10-CM | POA: Diagnosis not present

## 2013-08-18 DIAGNOSIS — H2510 Age-related nuclear cataract, unspecified eye: Secondary | ICD-10-CM | POA: Diagnosis not present

## 2013-10-13 ENCOUNTER — Telehealth: Payer: Self-pay | Admitting: Family Medicine

## 2013-10-13 ENCOUNTER — Encounter: Payer: Self-pay | Admitting: *Deleted

## 2013-10-13 NOTE — Telephone Encounter (Signed)
Pt has to take a flight today at 3:30 to [place] bc her sister is dying,  Pt has 2 metal plates in her hip frorm hip replacements and needs a letter stating these metal plates are there. Pt cannot get in touch w/ orthopedic dr and hopes dr Selena Batten will write this letter for her asap so she can get on plane..pls advise Pt needs in the next hour if possible

## 2013-10-13 NOTE — Telephone Encounter (Signed)
Please print hip xray from 2012.  Letter stating:  Per review of xray from "date on film" this patient has a history of a hip replacement. See attached radiology report.  Please let them know that it usually take SEVERAL DAYS TO A WEEK TO PROCESS THIS TYPE OF REQUEST.

## 2013-10-13 NOTE — Telephone Encounter (Signed)
I called the pt and she is aware the letter and a copy of the x-ray report from 06/2010 was left at the front desk for her to pick up.

## 2013-10-21 ENCOUNTER — Ambulatory Visit (INDEPENDENT_AMBULATORY_CARE_PROVIDER_SITE_OTHER): Payer: Medicare Other

## 2013-10-21 DIAGNOSIS — Z23 Encounter for immunization: Secondary | ICD-10-CM | POA: Diagnosis not present

## 2013-12-29 DIAGNOSIS — M353 Polymyalgia rheumatica: Secondary | ICD-10-CM | POA: Diagnosis not present

## 2013-12-29 DIAGNOSIS — M7532 Calcific tendinitis of left shoulder: Secondary | ICD-10-CM | POA: Diagnosis not present

## 2013-12-29 DIAGNOSIS — I10 Essential (primary) hypertension: Secondary | ICD-10-CM | POA: Diagnosis not present

## 2013-12-29 DIAGNOSIS — M810 Age-related osteoporosis without current pathological fracture: Secondary | ICD-10-CM | POA: Diagnosis not present

## 2014-01-01 ENCOUNTER — Encounter: Payer: Self-pay | Admitting: Family Medicine

## 2014-01-04 ENCOUNTER — Other Ambulatory Visit: Payer: Self-pay | Admitting: Family Medicine

## 2014-01-26 ENCOUNTER — Other Ambulatory Visit: Payer: Self-pay | Admitting: Family Medicine

## 2014-02-27 ENCOUNTER — Other Ambulatory Visit: Payer: Self-pay | Admitting: Family Medicine

## 2014-03-01 ENCOUNTER — Other Ambulatory Visit: Payer: Self-pay | Admitting: *Deleted

## 2014-03-01 MED ORDER — HYDROCHLOROTHIAZIDE 12.5 MG PO TABS: 12.5000 mg | ORAL_TABLET | Freq: Every day | ORAL | Status: DC

## 2014-03-01 NOTE — Telephone Encounter (Signed)
Rx for 90 day denied, refill was sent to local pharmacy as the pt needs an appt.

## 2014-03-19 ENCOUNTER — Encounter: Payer: Self-pay | Admitting: Family Medicine

## 2014-03-19 ENCOUNTER — Ambulatory Visit (INDEPENDENT_AMBULATORY_CARE_PROVIDER_SITE_OTHER): Payer: Medicare Other | Admitting: Family Medicine

## 2014-03-19 VITALS — BP 130/76 | HR 69 | Temp 97.5°F | Ht 61.0 in | Wt 109.4 lb

## 2014-03-19 DIAGNOSIS — I10 Essential (primary) hypertension: Secondary | ICD-10-CM | POA: Diagnosis not present

## 2014-03-19 DIAGNOSIS — E785 Hyperlipidemia, unspecified: Secondary | ICD-10-CM | POA: Diagnosis not present

## 2014-03-19 DIAGNOSIS — M353 Polymyalgia rheumatica: Secondary | ICD-10-CM

## 2014-03-19 NOTE — Progress Notes (Signed)
HPI:  Follow up:  HTN: -current meds: chlorthalidone 12.5 -did not tol BB and had mild edema with norvasc -denies: CP, SOB, swelling, palpitations  Mild Hyperlipidemia: -not on medication -diet and exercise: exercising daily, eating healthy  Polymyalgia Rheumatica: -sees Dr Kellie Simmeringruslow in rhuematology -on chronic low dose prednisone and fosamac  ROS: See pertinent positives and negatives per HPI.  Past Medical History  Diagnosis Date  . VITAMIN D DEFICIENCY 06/30/2009  . HYPERLIPIDEMIA 03/27/2007  . Pernicious anemia 10/25/2006  . HYPERTENSION 07/12/2006  . ABSCESS, TOOTH 11/14/2009  . Polymyalgia rheumatica 03/27/2007  . OSTEOPOROSIS 07/12/2006  . Alcohol abuse   . EMPHYSEMA 01/26/2009    pt. unsure of this reports as of 07/31/2012  . Arthritis     polymyalgia rhuematica , all over her body  . Gout 08/11/2012  . Fracture of fifth metatarsal bone of right foot 08/29/2012    Past Surgical History  Procedure Laterality Date  . Appendectomy  07/12/2006  . Partial hip arthroplasty      left side- 2x's ( 2nd surgery to correct 1st replacement), R side -   . Tooth extraction N/A 08/04/2012    Procedure: EXTRACTIONS 17, 20;  Surgeon: Georgia LopesScott M Jensen, DDS;  Location: [facility] OR;  Service: Oral Surgery;  Laterality: N/A;    Family History  Problem Relation Age of Onset  . Mental illness Mother     alzheimer dementia  . Ulcers Father   . Heart disease Father   . Cancer Father     lung, smoker    History   Social History  . Marital Status: Married    Spouse Name: N/A  . Number of Children: N/A  . Years of Education: N/A   Social History Main Topics  . Smoking status: Former Smoker    Quit date: 07/12/2006  . Smokeless tobacco: Not on file  . Alcohol Use: 6.0 oz/week    10 Shots of liquor per week     Comment: quit completely - 2012  . Drug Use: No  . Sexual Activity: No   Other Topics Concern  . None   Social History Narrative   Work or School: none      Home Situation:  lives with husband - drives, husband takes care of finances, dresses herself, takes care of housework and cooking      Spiritual Beliefs: [employer]      Lifestyle: uses cane when goes out, exercises sometimes - diet healthy              Current outpatient prescriptions:  .  alendronate (FOSAMAX) 70 MG tablet, Take 70 mg by mouth every 7 (seven) days. , Disp: , Rfl:  .  cyanocobalamin (,VITAMIN B-12,) 1000 MCG/ML injection, INJECT 1ML INTO THE MUSCLE EVERY 30 DAYS, Disp: 30 mL, Rfl: 0 .  hydrochlorothiazide (HYDRODIURIL) 12.5 MG tablet, Take 1 tablet (12.5 mg total) by mouth daily., Disp: 30 tablet, Rfl: 0 .  predniSONE (DELTASONE) 5 MG tablet, Take 2.5 mg by mouth daily. , Disp: , Rfl:   EXAM:  Filed Vitals:   03/19/14 1345  BP: 130/76  Pulse: 69  Temp: 97.5 F (36.4 C)    Body mass index is 20.68 kg/(m^2).  GENERAL: vitals reviewed and listed above, alert, oriented, appears well hydrated and in no acute distress  HEENT: atraumatic, conjunttiva clear, no obvious abnormalities on inspection of external nose and ears  NECK: no obvious masses on inspection  LUNGS: clear to auscultation bilaterally, no wheezes, rales or  rhonchi, good air movement  CV: HRRR, no peripheral edema  MS: moves all extremities without noticeable abnormality  PSYCH: pleasant and cooperative, no obvious depression or anxiety  ASSESSMENT AND PLAN:  Discussed the following assessment and plan:  Essential hypertension  Polymyalgia rheumatica  Mild hyperlipidemia  -advised to schedule MEDICARE WELLNESS VISIT for this some and come fasting for labs -Patient advised to return or notify a doctor immediately if symptoms worsen or persist or new concerns arise.  Patient Instructions  BEFORE YOU LEAVE: -schedule Medicare Wellness Exam in about 3-4 months - come fasting     [name], [name]

## 2014-03-19 NOTE — Progress Notes (Signed)
Pre visit review using our clinic review tool, if applicable. No additional management support is needed unless otherwise documented below in the visit note. 

## 2014-03-19 NOTE — Patient Instructions (Signed)
BEFORE YOU LEAVE: -schedule Medicare Wellness Exam in about 3-4 months - come fasting

## 2014-03-23 ENCOUNTER — Telehealth: Payer: Self-pay | Admitting: Family Medicine

## 2014-03-23 NOTE — Telephone Encounter (Signed)
emmi mailed  °

## 2014-04-06 ENCOUNTER — Telehealth: Payer: Self-pay | Admitting: Family Medicine

## 2014-04-06 MED ORDER — HYDROCHLOROTHIAZIDE 12.5 MG PO TABS: 12.5000 mg | ORAL_TABLET | Freq: Every day | ORAL | Status: DC

## 2014-04-06 NOTE — Telephone Encounter (Signed)
Pt need refill on hctz 12.5 mg $90 w/refills sent to [facility]

## 2014-04-06 NOTE — Telephone Encounter (Signed)
Rx done. 

## 2014-06-29 DIAGNOSIS — M810 Age-related osteoporosis without current pathological fracture: Secondary | ICD-10-CM | POA: Diagnosis not present

## 2014-06-29 DIAGNOSIS — M353 Polymyalgia rheumatica: Secondary | ICD-10-CM | POA: Diagnosis not present

## 2014-06-29 DIAGNOSIS — M25519 Pain in unspecified shoulder: Secondary | ICD-10-CM | POA: Diagnosis not present

## 2014-07-20 ENCOUNTER — Ambulatory Visit (INDEPENDENT_AMBULATORY_CARE_PROVIDER_SITE_OTHER): Payer: Medicare Other | Admitting: Family Medicine

## 2014-07-20 ENCOUNTER — Telehealth: Payer: Self-pay | Admitting: *Deleted

## 2014-07-20 ENCOUNTER — Encounter: Payer: Self-pay | Admitting: Family Medicine

## 2014-07-20 VITALS — BP 138/70 | HR 76 | Temp 97.8°F | Ht 61.0 in | Wt 111.3 lb

## 2014-07-20 DIAGNOSIS — I10 Essential (primary) hypertension: Secondary | ICD-10-CM | POA: Diagnosis not present

## 2014-07-20 DIAGNOSIS — Z1239 Encounter for other screening for malignant neoplasm of breast: Secondary | ICD-10-CM

## 2014-07-20 DIAGNOSIS — D510 Vitamin B12 deficiency anemia due to intrinsic factor deficiency: Secondary | ICD-10-CM

## 2014-07-20 DIAGNOSIS — E785 Hyperlipidemia, unspecified: Secondary | ICD-10-CM

## 2014-07-20 DIAGNOSIS — Z0000 Encounter for general adult medical examination without abnormal findings: Secondary | ICD-10-CM

## 2014-07-20 DIAGNOSIS — M810 Age-related osteoporosis without current pathological fracture: Secondary | ICD-10-CM

## 2014-07-20 DIAGNOSIS — M353 Polymyalgia rheumatica: Secondary | ICD-10-CM

## 2014-07-20 DIAGNOSIS — E559 Vitamin D deficiency, unspecified: Secondary | ICD-10-CM

## 2014-07-20 DIAGNOSIS — Z23 Encounter for immunization: Secondary | ICD-10-CM

## 2014-07-20 LAB — BASIC METABOLIC PANEL
BUN: 13 mg/dL (ref 6–23)
CALCIUM: 9.7 mg/dL (ref 8.4–10.5)
CO2: 32 meq/L (ref 19–32)
CREATININE: 1.01 mg/dL (ref 0.40–1.20)
Chloride: 104 mEq/L (ref 96–112)
GFR: 55.46 mL/min — ABNORMAL LOW (ref >60.00)
GLUCOSE: 97 mg/dL (ref 70–99)
Potassium: 4.4 mEq/L (ref 3.5–5.1)
Sodium: 142 mEq/L (ref 135–145)

## 2014-07-20 LAB — CBC WITH DIFFERENTIAL/PLATELET
BASOS ABS: 0 10*3/uL (ref 0.0–0.1)
Basophils Relative: 0.3 % (ref 0.0–3.0)
Eosinophils Absolute: 0.2 10*3/uL (ref 0.0–0.7)
Eosinophils Relative: 2.1 % (ref 0.0–5.0)
HCT: 42.5 % (ref 36.0–46.0)
Hemoglobin: 14.1 g/dL (ref 12.0–15.0)
LYMPHS ABS: 1.9 10*3/uL (ref 0.7–4.0)
LYMPHS PCT: 24.9 % (ref 12.0–46.0)
MCHC: 33 g/dL (ref 30.0–36.0)
MCV: 95.8 fl (ref 78.0–100.0)
MONO ABS: 0.7 10*3/uL (ref 0.1–1.0)
Monocytes Relative: 8.8 % (ref 3.0–12.0)
Neutro Abs: 4.9 10*3/uL (ref 1.4–7.7)
Neutrophils Relative %: 63.9 % (ref 43.0–77.0)
PLATELETS: 244 10*3/uL (ref 150.0–400.0)
RBC: 4.44 Mil/uL (ref 3.87–5.11)
RDW: 14 % (ref 11.5–15.5)
WBC: 7.7 10*3/uL (ref 4.0–10.5)

## 2014-07-20 LAB — LIPID PANEL
Cholesterol: 207 mg/dL — ABNORMAL HIGH (ref 0–200)
HDL: 64.3 mg/dL (ref >39.00)
LDL Cholesterol: 114 mg/dL — ABNORMAL HIGH (ref 0–99)
NONHDL: 142.7
Total CHOL/HDL Ratio: 3
Triglycerides: 146 mg/dL (ref 0.0–149.0)
VLDL: 29.2 mg/dL (ref 0.0–40.0)

## 2014-07-20 MED ORDER — ALENDRONATE SODIUM 70 MG PO TABS: 70.0000 mg | ORAL_TABLET | ORAL | Status: DC

## 2014-07-20 MED ORDER — HYDROCHLOROTHIAZIDE 12.5 MG PO TABS: 12.5000 mg | ORAL_TABLET | Freq: Every day | ORAL | Status: DC

## 2014-07-20 NOTE — Addendum Note (Signed)
Addended by: Johnella MoloneyFUNDERBURK, JO A on: 07/20/2014 08:57 AM   Modules accepted: Orders

## 2014-07-20 NOTE — Patient Instructions (Signed)
BEFORE YOU LEAVE: -labs -follow up in 6 months -prevnar 13       AAA screening: n/a     Alcohol screening: done, advised to cut back     Obesity Screening and counseling: done     STI screening (Hep C if born [id number]): declined     Tobacco Screening: done       Pneumococcal (PPSV23 -one dose after 64, one before if risk factors), influenza yearly and hepatitis B vaccines (if high risk - end stage renal disease, IV drugs, homosexual men, live in home for mentally retarded, hemophilia receiving factors) ASSESSMENT/PLAN: offered today, she wants this today      Screening mammograph (yearly if >40) ASSESSMENT/PLAN:  She will call to schedule      Screening Pap smear/pelvic exam (q2 years) ASSESSMENT/PLAN: n/a      Prostate cancer screening ASSESSMENT/PLAN: n/a      Colorectal cancer screening (FOBT yearly or flex sig q4y or colonoscopy q10y or barium enema q4y) ASSESSMENT/PLAN: done      Diabetes outpatient self-management training services ASSESSMENT/PLAN: n/a      Bone mass measurements(covered q2y if indicated - estrogen def, osteoporosis, hyperparathyroid, vertebral abnormalities, osteoporosis or steroids) ASSESSMENT/PLAN: last dexa in 2012, on fosamax, will repeat      Screening for glaucoma(q1y if high risk - diabetes, FH, AA and > 50 or hispanic and > 65) ASSESSMENT/PLAN: sees optho      Medical nutritional therapy for individuals with diabetes or renal disease ASSESSMENT/PLAN: n/a      Cardiovascular screening blood tests (lipids q5y) ASSESSMENT/PLAN: FASTING      Diabetes screening tests ASSESSMENT/PLAN: done

## 2014-07-20 NOTE — Progress Notes (Signed)
Medicare Annual Preventive Care Visit  (initial annual wellness or annual wellness exam)  Concerns and/or follow up today:  HTN: -current meds: chlorthalidone 12.5 -did not tol BB and had mild edema with norvasc -denies: CP, SOB, swelling, palpitations  Mild Hyperlipidemia: -not on medication -diet and exercise: exercising daily, eating healthy  Polymyalgia Rheumatica: -sees Dr Kellie Simmering in rhuematology -on chronic low dose prednisone and fosamax  ROS: negative for report of fevers, unintentional weight loss, vision changes, vision loss, hearing loss or change, chest pain, sob, hemoptysis, melena, hematochezia, hematuria, genital discharge or lesions, falls, bleeding or bruising, loc, thoughts of suicide or self harm, memory loss  1.) Patient-completed health risk assessment  - completed and reviewed, see scanned documentation  2.) Review of Medical History: -PMH, PSH, Family History and current specialty and care providers reviewed and updated and listed below  - see scanned in document in chart and below  Past Medical History  Diagnosis Date  . Hypertension   . Hyperlipemia 03/27/2007  . Pernicious anemia 10/25/2006  . Vitamin D deficiency 07/12/2006  . Polymyalgia rheumatica 03/27/2007    sees Dr. Kellie Simmering in rheumatology  . Osteoporosis 07/12/2006  . Alcohol abuse   . Emphysema of lung 01/26/2009    pt. unsure of this reports as of 07/31/2012  . Gout 08/11/2012  . Fracture of fifth metatarsal bone of right foot 08/29/2012    Past Surgical History  Procedure Laterality Date  . Appendectomy  07/12/2006  . Partial hip arthroplasty      left side- 2x's ( 2nd surgery to correct 1st replacement), R side -   . Tooth extraction N/A 08/04/2012    Procedure: EXTRACTIONS 17, 20;  Surgeon: Georgia Lopes, DDS;  Location: [facility] OR;  Service: Oral Surgery;  Laterality: N/A;    History   Social History  . Marital Status: Married    Spouse Name: N/A  . Number of Children: N/A  . Years of  Education: N/A   Occupational History  . Not on file.   Social History Main Topics  . Smoking status: Former Smoker    Quit date: 07/12/2006  . Smokeless tobacco: Not on file  . Alcohol Use: 6.0 oz/week    10 Shots of liquor per week     Comment: quit completely - 2012  . Drug Use: No  . Sexual Activity: No   Other Topics Concern  . Not on file   Social History Narrative   Work or School: none      Home Situation: lives with husband - drives, husband takes care of finances, dresses herself, takes care of housework and cooking      Spiritual Beliefs: [employer]      Lifestyle: uses cane when goes out, exercises sometimes - diet healthy             The patient has a family history of  3.) Review of functional ability and level of safety:  Any difficulty hearing? YES NO  History of falling? YES NO  Any trouble with IADLs - using a phone, using transportation, grocery shopping, preparing meals, doing housework, doing laundry, taking medications and managing money? YES NO  Advance Directives? YES NO  See summary of recommendations in Patient Instructions below.  4.) Physical Exam Filed Vitals:   07/20/14 0801  BP: 138/70  Pulse: 76  Temp: 97.8 F (36.6 C)   Estimated body mass index is 21.04 kg/(m^2) as calculated from the following:   Height as of this encounter:  5\' 1"  (1.549 m).   Weight as of this encounter: 111 lb 4.8 oz (50.485 kg).  EKG (optional): deferred  General: alert, appear well hydrated and in no acute distress  HEENT: visual acuity grossly intact  CV: HRRR  Lungs: CTA bilaterally  Psych: pleasant and cooperative, no obvious depression or anxiety  Cognitive function grosly intact  See patient instructions for recommendations.  Education and counseling regarding the above review of health provided with a plan for the following: -see scanned patient completed form for further details -fall prevention strategies discussed   -healthy lifestyle discussed -importance and resources for completing advanced directives discussed -see patient instructions below for any other recommendations provided  4)The following written screening schedule of preventive measures were reviewed with assessment and plan made per below, orders and patient instructions:      AAA screening: n/a     Alcohol screening: done, advised to cut back     Obesity Screening and counseling: done     STI screening (Hep C if born 1943-65): declined     Tobacco Screening: done       Pneumococcal (PPSV23 -one dose after 64, one before if risk factors), influenza yearly and hepatitis B vaccines (if high risk - end stage renal disease, IV drugs, homosexual men, live in home for mentally retarded, hemophilia receiving factors) ASSESSMENT/PLAN: offered today, she wants this today      Screening mammograph (yearly if >40) ASSESSMENT/PLAN:  She will call to schedule      Screening Pap smear/pelvic exam (q2 years) ASSESSMENT/PLAN: n/a      Prostate cancer screening ASSESSMENT/PLAN: n/a      Colorectal cancer screening (FOBT yearly or flex sig q4y or colonoscopy q10y or barium enema q4y) ASSESSMENT/PLAN: done      Diabetes outpatient self-management training services ASSESSMENT/PLAN: n/a      Bone mass measurements(covered q2y if indicated - estrogen def, osteoporosis, hyperparathyroid, vertebral abnormalities, osteoporosis or steroids) ASSESSMENT/PLAN: last dexa in 2012, on fosamax, will repeat      Screening for glaucoma(q1y if high risk - diabetes, FH, AA and > 50 or hispanic and > 65) ASSESSMENT/PLAN: sees optho      Medical nutritional therapy for individuals with diabetes or renal disease ASSESSMENT/PLAN: n/a      Cardiovascular screening blood tests (lipids q5y) ASSESSMENT/PLAN: FASTING      Diabetes screening tests ASSESSMENT/PLAN: done   7.) Summary: -risk factors and conditions per above assessment were discussed and  treatment, recommendations and referrals were offered per documentation above and orders and patient instructions.  Medicare annual wellness visit, subsequent  Osteoporosis - Plan: DG Bone Density  Essential hypertension - Plan: Basic metabolic panel  Polymyalgia rheumatica  Vitamin D deficiency  Breast cancer screening  Pernicious anemia - Plan: CBC with Differential  Hyperlipemia - Plan: Lipid Panel  There are no Patient Instructions on file for this visit.

## 2014-07-20 NOTE — Telephone Encounter (Signed)
Refills on fosamax and hctz sent. Do not advise lasix as the hctz is a diuretic. Thanks.

## 2014-07-20 NOTE — Telephone Encounter (Signed)
I called the pt to go over her test results and she requested refills on: Fosamax, HCTZ and Furosemide mg (states she likes to keep this on hand in case she needs this) sent to [facility].  Message forwarded to Dr Selena BattenKim.

## 2014-07-20 NOTE — Progress Notes (Signed)
Pre visit review using our clinic review tool, if applicable. No additional management support is needed unless otherwise documented below in the visit note. 

## 2014-07-21 NOTE — Telephone Encounter (Signed)
Patient informed. 

## 2014-08-03 ENCOUNTER — Ambulatory Visit (INDEPENDENT_AMBULATORY_CARE_PROVIDER_SITE_OTHER)
Admission: RE | Admit: 2014-08-03 | Discharge: 2014-08-03 | Disposition: A | Discharge status: Home / Self Care | Payer: Medicare Other | Source: Ambulatory Visit | Attending: Family Medicine | Admitting: Family Medicine

## 2014-08-03 DIAGNOSIS — M810 Age-related osteoporosis without current pathological fracture: Secondary | ICD-10-CM

## 2014-10-01 DIAGNOSIS — Z961 Presence of intraocular lens: Secondary | ICD-10-CM | POA: Diagnosis not present

## 2014-10-01 DIAGNOSIS — H26491 Other secondary cataract, right eye: Secondary | ICD-10-CM | POA: Diagnosis not present

## 2014-10-01 DIAGNOSIS — H35371 Puckering of macula, right eye: Secondary | ICD-10-CM | POA: Diagnosis not present

## 2014-10-12 ENCOUNTER — Ambulatory Visit (INDEPENDENT_AMBULATORY_CARE_PROVIDER_SITE_OTHER): Payer: Medicare Other | Admitting: *Deleted

## 2014-10-12 DIAGNOSIS — Z23 Encounter for immunization: Secondary | ICD-10-CM

## 2014-10-19 DIAGNOSIS — H26491 Other secondary cataract, right eye: Secondary | ICD-10-CM | POA: Diagnosis not present

## 2014-10-19 DIAGNOSIS — H2640 Unspecified secondary cataract: Secondary | ICD-10-CM | POA: Diagnosis not present

## 2014-11-11 DIAGNOSIS — H43813 Vitreous degeneration, bilateral: Secondary | ICD-10-CM | POA: Diagnosis not present

## 2014-11-11 DIAGNOSIS — H35373 Puckering of macula, bilateral: Secondary | ICD-10-CM | POA: Diagnosis not present

## 2014-12-28 DIAGNOSIS — I10 Essential (primary) hypertension: Secondary | ICD-10-CM | POA: Diagnosis not present

## 2014-12-28 DIAGNOSIS — Z79899 Other long term (current) drug therapy: Secondary | ICD-10-CM | POA: Diagnosis not present

## 2014-12-28 DIAGNOSIS — L821 Other seborrheic keratosis: Secondary | ICD-10-CM | POA: Diagnosis not present

## 2014-12-28 DIAGNOSIS — M810 Age-related osteoporosis without current pathological fracture: Secondary | ICD-10-CM | POA: Diagnosis not present

## 2014-12-28 DIAGNOSIS — M353 Polymyalgia rheumatica: Secondary | ICD-10-CM | POA: Diagnosis not present

## 2014-12-29 ENCOUNTER — Other Ambulatory Visit: Payer: Self-pay | Admitting: Family Medicine

## 2015-01-19 ENCOUNTER — Ambulatory Visit (INDEPENDENT_AMBULATORY_CARE_PROVIDER_SITE_OTHER): Payer: Medicare Other | Admitting: Family Medicine

## 2015-01-19 ENCOUNTER — Encounter: Payer: Self-pay | Admitting: Family Medicine

## 2015-01-19 VITALS — BP 112/60 | HR 72 | Temp 97.9°F | Ht 61.0 in | Wt 113.6 lb

## 2015-01-19 DIAGNOSIS — E559 Vitamin D deficiency, unspecified: Secondary | ICD-10-CM

## 2015-01-19 DIAGNOSIS — I10 Essential (primary) hypertension: Secondary | ICD-10-CM

## 2015-01-19 DIAGNOSIS — E785 Hyperlipidemia, unspecified: Secondary | ICD-10-CM

## 2015-01-19 DIAGNOSIS — D510 Vitamin B12 deficiency anemia due to intrinsic factor deficiency: Secondary | ICD-10-CM

## 2015-01-19 DIAGNOSIS — M353 Polymyalgia rheumatica: Secondary | ICD-10-CM | POA: Diagnosis not present

## 2015-01-19 NOTE — Progress Notes (Signed)
Pre visit review using our clinic review tool, if applicable. No additional management support is needed unless otherwise documented below in the visit note. 

## 2015-01-19 NOTE — Patient Instructions (Signed)
BEFORE YOU LEAVE: -schedule Medicare wellness visit in June - will plan to do labs then  We recommend the following healthy lifestyle measures: - eat a healthy whole foods diet consisting of regular small meals composed of vegetables, fruits, beans, nuts, seeds, healthy meats such as white chicken and fish and whole grains.  - avoid sweets, white starchy foods, fried foods, fast food, processed foods, sodas, red meet and other fattening foods.  - get a least 150-300 minutes of aerobic exercise per week.

## 2015-01-19 NOTE — Addendum Note (Signed)
Addended by: Sallee LangeFUNDERBURK, JO A on: 01/19/2015 10:14 AM   Modules accepted: Orders

## 2015-01-19 NOTE — Progress Notes (Signed)
HPI:  Follow up: HTN: -current meds: chlorthalidone 12.5 -did not tol BB and had mild edema with norvasc -denies: CP, SOB, swelling, palpitations  Mild Hyperlipidemia: -not on medication -diet and exercise: exercising daily, eating healthy  Polymyalgia Rheumatica/Hx osteoporosis: -sees Dr Kellie Simmering in rhuematology -on chronic low dose prednisone and fosamax -takes vit D  ROS: See pertinent positives and negatives per HPI.  Past Medical History  Diagnosis Date  . Hypertension   . Hyperlipemia 03/27/2007  . Pernicious anemia 10/25/2006  . Vitamin D deficiency 07/12/2006  . Polymyalgia rheumatica (HCC) 03/27/2007    sees Dr. Kellie Simmering in rheumatology  . Osteoporosis 07/12/2006  . Alcohol abuse   . Emphysema of lung (HCC) 01/26/2009    pt. unsure of this reports as of 07/31/2012  . Gout 08/11/2012  . Fracture of fifth metatarsal bone of right foot 08/29/2012    Past Surgical History  Procedure Laterality Date  . Appendectomy  07/12/2006  . Partial hip arthroplasty      left side- 2x's ( 2nd surgery to correct 1st replacement), R side -   . Tooth extraction N/A 08/04/2012    Procedure: EXTRACTIONS 17, 20;  Surgeon: Georgia Lopes, DDS;  Location: [facility] OR;  Service: Oral Surgery;  Laterality: N/A;    Family History  Problem Relation Age of Onset  . Mental illness Mother     alzheimer dementia  . Ulcers Father   . Heart disease Father   . Cancer Father     lung, smoker    Social History   Social History  . Marital Status: Married    Spouse Name: N/A  . Number of Children: N/A  . Years of Education: N/A   Social History Main Topics  . Smoking status: Former Smoker    Quit date: 07/12/2006  . Smokeless tobacco: None  . Alcohol Use: 6.0 oz/week    10 Shots of liquor per week     Comment: quit completely - 2012  . Drug Use: No  . Sexual Activity: No   Other Topics Concern  . None   Social History Narrative   Work or School: none      Home Situation: lives with husband  - drives, husband takes care of finances, dresses herself, takes care of housework and cooking      Spiritual Beliefs: [employer]      Lifestyle: uses cane when goes out, exercises sometimes - diet healthy              Current outpatient prescriptions:  .  alendronate (FOSAMAX) 70 MG tablet, Take 1 tablet (70 mg total) by mouth every 7 (seven) days., Disp: 12 tablet, Rfl: 3 .  CALCIUM PO, Take by mouth., Disp: , Rfl:  .  cyanocobalamin (,VITAMIN B-12,) 1000 MCG/ML injection, INJECT INTO THE MUSCLE EVERY 30 DAYS, Disp: 30 mL, Rfl: 0 .  hydrochlorothiazide (HYDRODIURIL) 12.5 MG tablet, TAKE 1 BY MOUTH DAILY, Disp: 90 tablet, Rfl: 0 .  predniSONE (DELTASONE) 5 MG tablet, Take 2.5 mg by mouth daily. , Disp: , Rfl:   EXAM:  Filed Vitals:   01/19/15 0946  BP: 112/60  Pulse: 72  Temp: 97.9 F (36.6 C)    Body mass index is 21.48 kg/(m^2).  GENERAL: vitals reviewed and listed above, alert, oriented, appears well hydrated and in no acute distress  HEENT: atraumatic, conjunttiva clear, no obvious abnormalities on inspection of external nose and ears  NECK: no obvious masses on inspection  LUNGS: clear to auscultation  bilaterally, no wheezes, rales or rhonchi, good air movement  CV: HRRR, no peripheral edema  MS: moves all extremities without noticeable abnormality  PSYCH: pleasant and cooperative, no obvious depression or anxiety  ASSESSMENT AND PLAN:  Discussed the following assessment and plan:  Essential hypertension  POLYMYALGIA RHEUMATICA  Hyperlipemia  Vitamin D deficiency  -doing well -labs at wellness visit -Patient advised to return or notify a doctor immediately if symptoms worsen or persist or new concerns arise.  Patient Instructions  BEFORE YOU LEAVE: -schedule Medicare wellness visit in June - will plan to do labs then  We recommend the following healthy lifestyle measures: - eat a healthy whole foods diet consisting of regular small  meals composed of vegetables, fruits, beans, nuts, seeds, healthy meats such as white chicken and fish and whole grains.  - avoid sweets, white starchy foods, fried foods, fast food, processed foods, sodas, red meet and other fattening foods.  - get a least 150-300 minutes of aerobic exercise per week.       Kriste BasqueKIM, [name]

## 2015-04-05 ENCOUNTER — Telehealth: Payer: Self-pay | Admitting: Family Medicine

## 2015-04-05 MED ORDER — HYDROCHLOROTHIAZIDE 12.5 MG PO TABS: ORAL_TABLET | ORAL | Status: DC

## 2015-04-05 NOTE — Telephone Encounter (Signed)
Pt request refill   hydrochlorothiazide (HYDRODIURIL) 12.5 MG tablet  Pt needs to switch pharmacy to  [facility]/  [address]

## 2015-04-05 NOTE — Telephone Encounter (Signed)
Rx done. 

## 2015-04-19 ENCOUNTER — Telehealth: Payer: Self-pay

## 2015-04-19 ENCOUNTER — Other Ambulatory Visit (INDEPENDENT_AMBULATORY_CARE_PROVIDER_SITE_OTHER): Payer: Medicare Other

## 2015-04-19 DIAGNOSIS — D510 Vitamin B12 deficiency anemia due to intrinsic factor deficiency: Secondary | ICD-10-CM | POA: Diagnosis not present

## 2015-04-19 LAB — VITAMIN B12: VITAMIN B 12: 942 pg/mL — AB (ref 211–911)

## 2015-04-19 NOTE — Telephone Encounter (Signed)
Was not told she was concerned for blood clots. Needs appt asap. Please ensure has appt.
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
## 2015-04-20 NOTE — Telephone Encounter (Signed)
I called the pt and informed her of the message below and she stated her leg does not feel any worse and she will call back for an appt if needed.

## 2015-04-26 ENCOUNTER — Ambulatory Visit (INDEPENDENT_AMBULATORY_CARE_PROVIDER_SITE_OTHER)
Admission: RE | Admit: 2015-04-26 | Discharge: 2015-04-26 | Disposition: A | Discharge status: Home / Self Care | Payer: Medicare Other | Source: Ambulatory Visit | Attending: Family Medicine | Admitting: Family Medicine

## 2015-04-26 ENCOUNTER — Encounter: Payer: Self-pay | Admitting: Family Medicine

## 2015-04-26 ENCOUNTER — Ambulatory Visit (INDEPENDENT_AMBULATORY_CARE_PROVIDER_SITE_OTHER): Payer: Medicare Other | Admitting: Family Medicine

## 2015-04-26 VITALS — BP 142/52 | HR 78 | Temp 97.8°F | Ht 61.0 in | Wt 110.8 lb

## 2015-04-26 DIAGNOSIS — T148XXA Other injury of unspecified body region, initial encounter: Secondary | ICD-10-CM

## 2015-04-26 DIAGNOSIS — L03116 Cellulitis of left lower limb: Secondary | ICD-10-CM

## 2015-04-26 DIAGNOSIS — T148 Other injury of unspecified body region: Secondary | ICD-10-CM

## 2015-04-26 DIAGNOSIS — M25562 Pain in left knee: Secondary | ICD-10-CM

## 2015-04-26 DIAGNOSIS — S8992XA Unspecified injury of left lower leg, initial encounter: Secondary | ICD-10-CM | POA: Diagnosis not present

## 2015-04-26 DIAGNOSIS — M7989 Other specified soft tissue disorders: Secondary | ICD-10-CM | POA: Diagnosis not present

## 2015-04-26 DIAGNOSIS — M25572 Pain in left ankle and joints of left foot: Secondary | ICD-10-CM

## 2015-04-26 DIAGNOSIS — S99912A Unspecified injury of left ankle, initial encounter: Secondary | ICD-10-CM | POA: Diagnosis not present

## 2015-04-26 MED ORDER — DOXYCYCLINE HYCLATE 100 MG PO CAPS: 100.0000 mg | ORAL_CAPSULE | Freq: Two times a day (BID) | ORAL | Status: DC

## 2015-04-26 NOTE — Progress Notes (Signed)
Pre visit review using our clinic review tool, if applicable. No additional management support is needed unless otherwise documented below in the visit note. 

## 2015-04-26 NOTE — Patient Instructions (Signed)
Before you leave: -X-ray sheet -Follow up in 1 week  Go to get x-ray today  Start the antibiotic intake as instructed for 5 days
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
## 2015-04-28 DIAGNOSIS — H35373 Puckering of macula, bilateral: Secondary | ICD-10-CM | POA: Diagnosis not present

## 2015-04-28 DIAGNOSIS — H43813 Vitreous degeneration, bilateral: Secondary | ICD-10-CM | POA: Diagnosis not present

## 2015-05-03 ENCOUNTER — Telehealth: Payer: Self-pay | Admitting: *Deleted

## 2015-05-03 ENCOUNTER — Ambulatory Visit (INDEPENDENT_AMBULATORY_CARE_PROVIDER_SITE_OTHER): Payer: Medicare Other | Admitting: Family Medicine

## 2015-05-03 ENCOUNTER — Encounter: Payer: Self-pay | Admitting: Family Medicine

## 2015-05-03 ENCOUNTER — Ambulatory Visit (HOSPITAL_COMMUNITY)
Admission: RE | Admit: 2015-05-03 | Discharge: 2015-05-03 | Disposition: A | Discharge status: Home / Self Care | Payer: Medicare Other | Source: Ambulatory Visit | Attending: Cardiology | Admitting: Cardiology

## 2015-05-03 VITALS — BP 126/78 | HR 80 | Temp 97.7°F | Ht 61.0 in | Wt 109.8 lb

## 2015-05-03 DIAGNOSIS — I10 Essential (primary) hypertension: Secondary | ICD-10-CM | POA: Insufficient documentation

## 2015-05-03 DIAGNOSIS — L03116 Cellulitis of left lower limb: Secondary | ICD-10-CM | POA: Diagnosis not present

## 2015-05-03 DIAGNOSIS — E785 Hyperlipidemia, unspecified: Secondary | ICD-10-CM | POA: Insufficient documentation

## 2015-05-03 DIAGNOSIS — R600 Localized edema: Secondary | ICD-10-CM | POA: Insufficient documentation

## 2015-05-03 LAB — CBC WITH DIFFERENTIAL/PLATELET
BASOS ABS: 0 10*3/uL (ref 0.0–0.1)
Basophils Relative: 0.4 % (ref 0.0–3.0)
EOS PCT: 0.4 % (ref 0.0–5.0)
Eosinophils Absolute: 0 10*3/uL (ref 0.0–0.7)
HCT: 41.9 % (ref 36.0–46.0)
HEMOGLOBIN: 14.1 g/dL (ref 12.0–15.0)
LYMPHS PCT: 11.1 % — AB (ref 12.0–46.0)
Lymphs Abs: 1.4 10*3/uL (ref 0.7–4.0)
MCHC: 33.5 g/dL (ref 30.0–36.0)
MCV: 95.9 fl (ref 78.0–100.0)
MONOS PCT: 6.3 % (ref 3.0–12.0)
Monocytes Absolute: 0.8 10*3/uL (ref 0.1–1.0)
Neutro Abs: 10 10*3/uL — ABNORMAL HIGH (ref 1.4–7.7)
Neutrophils Relative %: 81.8 % — ABNORMAL HIGH (ref 43.0–77.0)
Platelets: 289 10*3/uL (ref 150.0–400.0)
RBC: 4.37 Mil/uL (ref 3.87–5.11)
RDW: 14 % (ref 11.5–15.5)
WBC: 12.2 10*3/uL — AB (ref 4.0–10.5)

## 2015-05-03 LAB — BASIC METABOLIC PANEL
BUN: 25 mg/dL — AB (ref 6–23)
CO2: 29 mEq/L (ref 19–32)
CREATININE: 1.02 mg/dL (ref 0.40–1.20)
Calcium: 9.3 mg/dL (ref 8.4–10.5)
Chloride: 103 mEq/L (ref 96–112)
GFR: 54.73 mL/min — AB (ref >60.00)
Glucose, Bld: 94 mg/dL (ref 70–99)
Potassium: 3.6 mEq/L (ref 3.5–5.1)
Sodium: 142 mEq/L (ref 135–145)

## 2015-05-03 LAB — C-REACTIVE PROTEIN: CRP: 1.3 mg/dL (ref 0.5–20.0)

## 2015-05-03 MED ORDER — SULFAMETHOXAZOLE-TRIMETHOPRIM 800-160 MG PO TABS: 1.0000 | ORAL_TABLET | Freq: Two times a day (BID) | ORAL | Status: DC

## 2015-05-03 MED ORDER — CEPHALEXIN 500 MG PO CAPS: 500.0000 mg | ORAL_CAPSULE | Freq: Three times a day (TID) | ORAL | Status: DC
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
## 2015-05-03 NOTE — Patient Instructions (Signed)
Before you leave: -Ultrasound appointment details -Schedule follow up later this week or the beginning of next week -Labs   Please elevate the left leg high above the waist for 30 minutes twice daily. Try to stay off your feet somewhat.  Stop the doxycycline and start the antibiotics that we sent to your pharmacy including Bactrim and Keflex. Take according to instructions.   Follow up immediately if symptoms are worsening, he developed fevers, feels sick or have other concerns.

## 2015-05-03 NOTE — Telephone Encounter (Signed)
[facility] with [facility] call [facility] stating the doppler study was normal.  I advised LaDonna per Dr Selena BattenKim to let the pt know this and she agreed.

## 2015-05-03 NOTE — Progress Notes (Signed)
Pre visit review using our clinic review tool, if applicable. No additional management support is needed unless otherwise documented below in the visit note. 

## 2015-05-04 NOTE — Progress Notes (Signed)
I left a message at the pts home number to return my call-no voicemail at her cell number.

## 2015-05-04 NOTE — Progress Notes (Signed)
I called the pt and informed her of the message below and she stated she was sunburned only on the top of her hands and near the straps of her top and she has not had diarrhea since this morning and it is nothing to be concerned about.  I advised she call back if needed and she agreed.

## 2015-05-04 NOTE — Progress Notes (Signed)
Please advised that she not get in the sun. The doxycycline in particular can cause increased sun burn risk. She should not be taking the doxycycline any longer. If she has a skin rash or bad sunburn she should be seen. If having more than 3 episodes of diarrhea daily we need to check for a bowel infection related to the antibiotic. Given her skin infection, I do not recommend stopping either of the 2 antibiotic she just started, unless she is having a reaction, in which case she needs an appointment. Thank you.

## 2015-05-10 ENCOUNTER — Encounter: Payer: Self-pay | Admitting: Family Medicine

## 2015-05-10 ENCOUNTER — Ambulatory Visit (INDEPENDENT_AMBULATORY_CARE_PROVIDER_SITE_OTHER): Payer: Medicare Other | Admitting: Family Medicine

## 2015-05-10 VITALS — BP 120/60 | HR 78 | Temp 98.0°F | Ht 61.0 in | Wt 108.4 lb

## 2015-05-10 DIAGNOSIS — L03116 Cellulitis of left lower limb: Secondary | ICD-10-CM

## 2015-05-10 DIAGNOSIS — R69 Illness, unspecified: Secondary | ICD-10-CM

## 2015-05-10 MED ORDER — CEPHALEXIN 500 MG PO CAPS: 500.0000 mg | ORAL_CAPSULE | Freq: Two times a day (BID) | ORAL | Status: DC

## 2015-05-10 MED ORDER — SULFAMETHOXAZOLE-TRIMETHOPRIM 800-160 MG PO TABS: 1.0000 | ORAL_TABLET | Freq: Two times a day (BID) | ORAL | Status: DC

## 2015-05-10 MED ORDER — DOXYCYCLINE HYCLATE 100 MG PO CAPS: 100.0000 mg | ORAL_CAPSULE | Freq: Two times a day (BID) | ORAL | Status: DC

## 2015-05-10 NOTE — Patient Instructions (Signed)
Before you leave: - A follow-up in 1 week for cellulitis  Please monitor your leg very closely. If there is increased redness, swelling or pain please start both antibiotics again.  Please elevate the legs for 30 minutes twice daily with compresses. Please try to stay off with no excessive activity.

## 2015-05-10 NOTE — Progress Notes (Signed)
Pre visit review using our clinic review tool, if applicable. No additional management support is needed unless otherwise documented below in the visit note. 

## 2015-05-10 NOTE — Progress Notes (Signed)
Late reschedule.

## 2015-05-10 NOTE — Progress Notes (Signed)
HPI:  Follow up:  L LE cellulitis: -[place] And xrays neg -s/p doxy and now bactrim/keflex -reports: This is doing much better, she has not been elevating her leg, minimal pain at times -denies: Fevers, malaise, further drainage or spreading of redness  ROS: See pertinent positives and negatives per HPI.  Past Medical History  Diagnosis Date  . Hypertension   . Hyperlipemia 03/27/2007  . Pernicious anemia 10/25/2006  . Vitamin D deficiency 07/12/2006  . Polymyalgia rheumatica (HCC) 03/27/2007    sees Dr. Kellie Simmering in rheumatology  . Osteoporosis 07/12/2006  . Alcohol abuse   . Emphysema of lung (HCC) 01/26/2009    pt. unsure of this reports as of 07/31/2012  . Gout 08/11/2012  . Fracture of fifth metatarsal bone of right foot 08/29/2012    Past Surgical History  Procedure Laterality Date  . Appendectomy  07/12/2006  . Partial hip arthroplasty      left side- 2x's ( 2nd surgery to correct 1st replacement), R side -   . Tooth extraction N/A 08/04/2012    Procedure: EXTRACTIONS 17, 20;  Surgeon: Georgia Lopes, DDS;  Location: [facility] OR;  Service: Oral Surgery;  Laterality: N/A;    Family History  Problem Relation Age of Onset  . Mental illness Mother     alzheimer dementia  . Ulcers Father   . Heart disease Father   . Cancer Father     lung, smoker    Social History   Social History  . Marital Status: Married    Spouse Name: N/A  . Number of Children: N/A  . Years of Education: N/A   Social History Main Topics  . Smoking status: Former Smoker    Quit date: 07/12/2006  . Smokeless tobacco: None  . Alcohol Use: 6.0 oz/week    10 Shots of liquor per week     Comment: quit completely - 2012  . Drug Use: No  . Sexual Activity: No   Other Topics Concern  . None   Social History Narrative   Work or School: none      Home Situation: lives with husband - drives, husband takes care of finances, dresses herself, takes care of housework and cooking      Spiritual Beliefs:  [employer]      Lifestyle: uses cane when goes out, exercises sometimes - diet healthy              Current outpatient prescriptions:  .  alendronate (FOSAMAX) 70 MG tablet, Take 1 tablet (70 mg total) by mouth every 7 (seven) days., Disp: 12 tablet, Rfl: 3 .  CALCIUM PO, Take by mouth., Disp: , Rfl:  .  Cyanocobalamin (VITAMIN B-12) 1000 MCG SUBL, Place under the tongue., Disp: , Rfl:  .  hydrochlorothiazide (HYDRODIURIL) 12.5 MG tablet, TAKE 1 BY MOUTH DAILY, Disp: 90 tablet, Rfl: 0 .  predniSONE (DELTASONE) 5 MG tablet, Take 2.5 mg by mouth daily. , Disp: , Rfl:  .  cephALEXin (KEFLEX) 500 MG capsule, Take 1 capsule (500 mg total) by mouth 2 (two) times daily., Disp: 15 capsule, Rfl: 0 .  sulfamethoxazole-trimethoprim (BACTRIM DS,SEPTRA DS) 800-160 MG tablet, Take 1 tablet by mouth 2 (two) times daily., Disp: 10 tablet, Rfl: 0  EXAM:  Filed Vitals:   05/10/15 1436  BP: 120/60  Pulse: 78  Temp: 98 F (36.7 C)    Body mass index is 20.49 kg/(m^2).  GENERAL: vitals reviewed and listed above, alert, oriented, appears well hydrated and in no  acute distress  HEENT: atraumatic, conjunttiva clear, no obvious abnormalities on inspection of external nose and ears  NECK: no obvious masses on inspection  SKIN: There is dried blood overlying the hematoma, the redness and swelling has decreased significantly, some with the edema near the ankle, less warm and erythema  MS: moves all extremities without noticeable abnormality  PSYCH: pleasant and cooperative, no obvious depression or anxiety  ASSESSMENT AND PLAN:  Discussed the following assessment and plan:  Cellulitis of left lower extremity -  -She feels that this is significantly improved and continues to improve off antibiotics the last few days. She hates taking the antibiotics and would prefer not to take further. I do feel that the infection may be resolved at this point with resolving inflammatory process. We opted to  do a trial off antibiotics with elevation and very close observation. However there is a low threshold to restart antibiotics and these Prescriptions were printed for her for Bactrim and Keflex so she has them on hand. Advised that she shred these prescriptions if she does not use them. Discussed possible referral for debridement of hematoma/wheezing, she is not interested in doing this and prefers to observe. Plan to follow up in 1 week.  -Patient advised to return or notify a doctor immediately if symptoms worsen or persist or new concerns arise.  Patient Instructions  Before you leave: - A follow-up in 1 week for cellulitis  Please monitor your leg very closely. If there is increased redness, swelling or pain please start both antibiotics again.  Please elevate the legs for 30 minutes twice daily with compresses. Please try to stay off with no excessive activity.     Kriste BasqueKIM, [name]

## 2015-05-17 ENCOUNTER — Encounter: Payer: Self-pay | Admitting: Family Medicine

## 2015-05-17 ENCOUNTER — Ambulatory Visit (INDEPENDENT_AMBULATORY_CARE_PROVIDER_SITE_OTHER): Payer: Medicare Other | Admitting: Family Medicine

## 2015-05-17 VITALS — BP 120/68 | HR 69 | Temp 97.8°F | Ht 61.0 in | Wt 109.6 lb

## 2015-05-17 DIAGNOSIS — S81802D Unspecified open wound, left lower leg, subsequent encounter: Secondary | ICD-10-CM

## 2015-05-17 DIAGNOSIS — L03116 Cellulitis of left lower limb: Secondary | ICD-10-CM

## 2015-05-17 DIAGNOSIS — L559 Sunburn, unspecified: Secondary | ICD-10-CM

## 2015-05-17 NOTE — Progress Notes (Signed)
Patient here for follow-up of wound on her left lateral lower leg. He reports swelling in ankle and foot are going down. Still has some drainage from time to time especially if scab is irritated by sheets or clothing. Feels area has enlarged some. Denies fevers, malaise, increasedswelling or redness.no longer on antibiotics Has been dealing with wound on the leg since the beginning of April after trauma from car door, initially with cellulitis and swelling. S/p several courses of abx. Imaging and labs were ok. She was in the sun against recommendations when she was on Doxy and suffered some sunburn on the hands and bilateral feet.  EXAM: Filed Vitals:   05/17/15 1102  BP: 120/68  Pulse: 69  Temp: 97.8 F (36.6 C)   General: alert, no acute distress  CV: normal pedal pulses, no lower extremity edema, cap refill normal  SKIN: 4x4-5 cm area of dried/crusted blood on L lateral lower leg. Wound debrided (crust and cutaneous/superficial subcutaneous) as tolerated, good granulation tissue in wound bed (~2.5x3cm) in area debrided, a few areas of crusted blood remain that she preferred to leave in place. No induration or fluctuance. Skin on foot remains mildly erythematous.  Assessment/plan:  Wound, open, leg, left, subsequent encounter Cellulitis of left lower extremity -think the cellulitis has resolved, given continued improvement off antibiotics -initially was concerned sound was enlarging but after debridement found it seems to be shrinking  -she does not wish to see wound clinic for poorly healing wound - wound (crust/cutaneous/superficial subcutaneous) tissues debrided with saline, guaze, forceps and silvadene dressing applied, home care recs  - she did not tolerate complete debridement and will use saline at home to hopefully further removal of crust. -follow up one week, sooner if worsening or questions  Sunburn -advised to avoid sun exposure, supportive care  Patient Instructions   Before you leave: Schedule follow-up in 1 week  Change dressing daily. Clean area with saline and gauze, then apply liberal amount of Silvadene, cover with gauze.

## 2015-05-17 NOTE — Patient Instructions (Signed)
Before you leave: Schedule follow-up in 1 week  Change dressing daily. Clean area with saline and gauze, then apply liberal amount of Silvadene, cover with gauze.

## 2015-05-17 NOTE — Progress Notes (Signed)
Pre visit review using our clinic review tool, if applicable. No additional management support is needed unless otherwise documented below in the visit note. 

## 2015-05-19 ENCOUNTER — Encounter: Payer: Self-pay | Admitting: Family Medicine

## 2015-05-19 ENCOUNTER — Ambulatory Visit (INDEPENDENT_AMBULATORY_CARE_PROVIDER_SITE_OTHER): Payer: Medicare Other | Admitting: Family Medicine

## 2015-05-19 ENCOUNTER — Telehealth: Payer: Self-pay | Admitting: Family Medicine

## 2015-05-19 VITALS — BP 120/52 | HR 80 | Temp 97.8°F | Ht 61.0 in | Wt 109.5 lb

## 2015-05-19 DIAGNOSIS — S81802D Unspecified open wound, left lower leg, subsequent encounter: Secondary | ICD-10-CM | POA: Diagnosis not present

## 2015-05-19 DIAGNOSIS — L03116 Cellulitis of left lower limb: Secondary | ICD-10-CM | POA: Diagnosis not present

## 2015-05-19 MED ORDER — SULFAMETHOXAZOLE-TRIMETHOPRIM 800-160 MG PO TABS: 1.0000 | ORAL_TABLET | Freq: Two times a day (BID) | ORAL | Status: DC

## 2015-05-19 MED ORDER — CEPHALEXIN 500 MG PO CAPS: 500.0000 mg | ORAL_CAPSULE | Freq: Two times a day (BID) | ORAL | Status: DC

## 2015-05-19 NOTE — Progress Notes (Signed)
Pre visit review using our clinic review tool, if applicable. No additional management support is needed unless otherwise documented below in the visit note. 

## 2015-05-19 NOTE — Telephone Encounter (Signed)
Pt was seen on 4/25 for her leg. Pt leg is worst. Please advise

## 2015-05-19 NOTE — Progress Notes (Signed)
HPI:  [name] is a pleasant 82 year old here for follow-up regarding a wound on her left leg. A car door hit her leg in early April and she developed a hematoma, which then became infected with cellulitis. She was treated with a course of doxycycline and then a course of Keflex and Bactrim and was improving. She has not been on antibiotics for the last 10 days. She has been caring for the room the last several days by applying Silvadene cream and changing dressings daily. She unfortunately went in the sun when she was on antibiotics and developed sunburn on her feet. Over the last day she has developed redness and swelling of the foot and ankle and increased pain in the wound on her leg. She denies any fevers, malaise, nausea or chills.  ROS: See pertinent positives and negatives per HPI.  Past Medical History  Diagnosis Date  . Hypertension   . Hyperlipemia 03/27/2007  . Pernicious anemia 10/25/2006  . Vitamin D deficiency 07/12/2006  . Polymyalgia rheumatica (HCC) 03/27/2007    sees Dr. Kellie Simmering in rheumatology  . Osteoporosis 07/12/2006  . Alcohol abuse   . Emphysema of lung (HCC) 01/26/2009    pt. unsure of this reports as of 07/31/2012  . Gout 08/11/2012  . Fracture of fifth metatarsal bone of right foot 08/29/2012    Past Surgical History  Procedure Laterality Date  . Appendectomy  07/12/2006  . Partial hip arthroplasty      left side- 2x's ( 2nd surgery to correct 1st replacement), R side -   . Tooth extraction N/A 08/04/2012    Procedure: EXTRACTIONS 17, 20;  Surgeon: Georgia Lopes, DDS;  Location: [facility] OR;  Service: Oral Surgery;  Laterality: N/A;    Family History  Problem Relation Age of Onset  . Mental illness Mother     alzheimer dementia  . Ulcers Father   . Heart disease Father   . Cancer Father     lung, smoker    Social History   Social History  . Marital Status: Married    Spouse Name: N/A  . Number of Children: N/A  . Years of Education: N/A   Social  History Main Topics  . Smoking status: Former Smoker    Quit date: 07/12/2006  . Smokeless tobacco: None  . Alcohol Use: 6.0 oz/week    10 Shots of liquor per week     Comment: quit completely - 2012  . Drug Use: No  . Sexual Activity: No   Other Topics Concern  . None   Social History Narrative   Work or School: none      Home Situation: lives with husband - drives, husband takes care of finances, dresses herself, takes care of housework and cooking      Spiritual Beliefs: [employer]      Lifestyle: uses cane when goes out, exercises sometimes - diet healthy              Current outpatient prescriptions:  .  alendronate (FOSAMAX) 70 MG tablet, Take 1 tablet (70 mg total) by mouth every 7 (seven) days., Disp: 12 tablet, Rfl: 3 .  CALCIUM PO, Take by mouth., Disp: , Rfl:  .  Cyanocobalamin (VITAMIN B-12) 1000 MCG SUBL, Place under the tongue., Disp: , Rfl:  .  hydrochlorothiazide (HYDRODIURIL) 12.5 MG tablet, TAKE 1 BY MOUTH DAILY, Disp: 90 tablet, Rfl: 0 .  predniSONE (DELTASONE) 5 MG tablet, Take 2.5 mg by mouth daily. , Disp: ,  Rfl:  .  cephALEXin (KEFLEX) 500 MG capsule, Take 1 capsule (500 mg total) by mouth 2 (two) times daily., Disp: 10 capsule, Rfl: 0 .  sulfamethoxazole-trimethoprim (BACTRIM DS) 800-160 MG tablet, Take 1 tablet by mouth 2 (two) times daily., Disp: 10 tablet, Rfl: 0  EXAM:  Filed Vitals:   05/19/15 1636  BP: 120/52  Pulse: 80  Temp: 97.8 F (36.6 C)    Body mass index is 20.7 kg/(m^2).  GENERAL: vitals reviewed and listed above, alert, oriented, appears well hydrated and in no acute distress  HEENT: atraumatic, conjunttiva clear, no obvious abnormalities on inspection of external nose and ears  NECK: no obvious masses on inspection  LUNGS: clear to auscultation bilaterally, no wheezes, rales or rhonchi, good air movement  CV: HRRR, no peripheral edema  SKIN: 2x3 cm wound on L lateral leg with overlying scab, erythema and edema of  the distal foot and small blister with crusting in sunburned area on L foot. She has stripes of sunburn on both feet where her sandals do not cover her skin. Some tenderness to palpation in this area. Also some tenderness to palpation around the wound on the leg and some increased swelling in this area as well. No drainage from the wound. No fluctuant masses or areas on the foot or the wound area.  MS: moves all extremities without noticeable abnormality  PSYCH: pleasant and cooperative, no obvious depression or anxiety  ASSESSMENT AND PLAN:  Discussed the following assessment and plan:  Wound, open, leg, left, subsequent encounter - Plan: Ambulatory referral to Wound Clinic, sulfamethoxazole-trimethoprim (BACTRIM DS) 800-160 MG tablet, cephALEXin (KEFLEX) 500 MG capsule, AMB referral to wound care center  Cellulitis of left lower extremity - Plan: sulfamethoxazole-trimethoprim (BACTRIM DS) 800-160 MG tablet, cephALEXin (KEFLEX) 500 MG capsule, AMB referral to wound care center  -I'm not sure if there was some residual infection in the wound, that is now resurfacing or if she has developed infection with sunburned area of the foot - seems the latter may be more likely given the appearance of the redness today which skips the anterior ankle. It may be that the increased edema in the foot and ankle and lower leg are causing the increased pain around the wound. She did not tolerate any wound exploration today, did clean area with saline and change to bandage. Will restart antibiotics after discussion of risks and options. Also placed stat referral to wound center. She really wants to avoid hospitalization.Advised follow-up here if not seen at the wound Center tomorrow. Advised that she go to the hospital if she is having spreading of redness up the leg, fevers or is feeling sick. -Patient advised to return or notify a doctor immediately if symptoms worsen or persist or new concerns arise.  Patient  Instructions  Follow-up tomorrow afternoon to recheck.  Restart the antibiotics (Bactrim and Keflex).  We sent a referral to the wound center.  Seek care immediately if this is worsening in the interim with increased pain, spreading redness, fevers or you are feeling sick.     Kriste BasqueKIM, Toby Breithaupt R.

## 2015-05-19 NOTE — Patient Instructions (Signed)
Follow-up tomorrow afternoon to recheck.  Restart the antibiotics (Bactrim and Keflex).  We sent a referral to the wound center.  Seek care immediately if this is worsening in the interim with increased pain, spreading redness, fevers or you are feeling sick.

## 2015-05-20 ENCOUNTER — Telehealth: Payer: Self-pay | Admitting: Family Medicine

## 2015-05-20 ENCOUNTER — Ambulatory Visit (INDEPENDENT_AMBULATORY_CARE_PROVIDER_SITE_OTHER): Payer: Medicare Other | Admitting: Family Medicine

## 2015-05-20 ENCOUNTER — Encounter: Payer: Self-pay | Admitting: Family Medicine

## 2015-05-20 VITALS — BP 92/50 | HR 62 | Temp 98.3°F | Ht 61.0 in

## 2015-05-20 DIAGNOSIS — S81802D Unspecified open wound, left lower leg, subsequent encounter: Secondary | ICD-10-CM | POA: Diagnosis not present

## 2015-05-20 DIAGNOSIS — L03116 Cellulitis of left lower limb: Secondary | ICD-10-CM | POA: Diagnosis not present

## 2015-05-20 NOTE — Telephone Encounter (Signed)
Per pt  call want to know if her condition may be gout , because she had it before .she has an appointment today @4 :15 pm  With Dr Selena Battenkim please advise

## 2015-05-20 NOTE — Progress Notes (Signed)
Pre visit review using our clinic review tool, if applicable. No additional management support is needed unless otherwise documented below in the visit note. 

## 2015-05-20 NOTE — Progress Notes (Signed)
HPI:  [name] is a pleasant 82 year old here for an acute visit for follow-up on left leg and foot swelling, wound of the left leg and cellulitis. We restarted antibiotics and she has taken several doses and feels has improved. The wound clinic cannot see her for about a week. She has no fevers, malaise, nausea, vomiting chills or worsening redness or streaking. Today she reports she has a history of gout and wonders if the redness and swelling in the toe and foot region is from gout. To make matters more complicated, she got a bad sunburn on doxycycline a few weeks ago on her feet. I had told her to stay out of the sun. She has a history of polymyalgia rheumatica and is on 2.5 mg of prednisone daily.  ROS: See pertinent positives and negatives per HPI.  Past Medical History  Diagnosis Date  . Hypertension   . Hyperlipemia 03/27/2007  . Pernicious anemia 10/25/2006  . Vitamin D deficiency 07/12/2006  . Polymyalgia rheumatica (HCC) 03/27/2007    sees Dr. Kellie [name] in rheumatology  . Osteoporosis 07/12/2006  . Alcohol abuse   . Emphysema of lung (HCC) 01/26/2009    pt. unsure of this reports as of 07/31/2012  . Gout 08/11/2012  . Fracture of fifth metatarsal bone of right foot 08/29/2012    Past Surgical History  Procedure Laterality Date  . Appendectomy  07/12/2006  . Partial hip arthroplasty      left side- 2x's ( 2nd surgery to correct 1st replacement), R side -   . Tooth extraction N/A 08/04/2012    Procedure: EXTRACTIONS 17, 20;  Surgeon: Georgia Lopes, DDS;  Location: [facility] OR;  Service: Oral Surgery;  Laterality: N/A;    Family History  Problem Relation Age of Onset  . Mental illness Mother     alzheimer dementia  . Ulcers Father   . Heart disease Father   . Cancer Father     lung, smoker    Social History   Social History  . Marital Status: Married    Spouse Name: N/A  . Number of Children: N/A  . Years of Education: N/A   Social History Main Topics  . Smoking  status: Former Smoker    Quit date: 07/12/2006  . Smokeless tobacco: None  . Alcohol Use: 6.0 oz/week    10 Shots of liquor per week     Comment: quit completely - 2012  . Drug Use: No  . Sexual Activity: No   Other Topics Concern  . None   Social History Narrative   Work or School: none      Home Situation: lives with husband - drives, husband takes care of finances, dresses herself, takes care of housework and cooking      Spiritual Beliefs: [employer]      Lifestyle: uses cane when goes out, exercises sometimes - diet healthy              Current outpatient prescriptions:  .  alendronate (FOSAMAX) 70 MG tablet, Take 1 tablet (70 mg total) by mouth every 7 (seven) days., Disp: 12 tablet, Rfl: 3 .  CALCIUM PO, Take by mouth., Disp: , Rfl:  .  cephALEXin (KEFLEX) 500 MG capsule, Take 1 capsule (500 mg total) by mouth 2 (two) times daily., Disp: 10 capsule, Rfl: 0 .  Cyanocobalamin (VITAMIN B-12) 1000 MCG SUBL, Place under the tongue., Disp: , Rfl:  .  hydrochlorothiazide (HYDRODIURIL) 12.5 MG tablet, TAKE 1 BY MOUTH DAILY,  Disp: 90 tablet, Rfl: 0 .  predniSONE (DELTASONE) 5 MG tablet, Take 2.5 mg by mouth daily. , Disp: , Rfl:  .  sulfamethoxazole-trimethoprim (BACTRIM DS) 800-160 MG tablet, Take 1 tablet by mouth 2 (two) times daily., Disp: 10 tablet, Rfl: 0  EXAM:  Filed Vitals:   05/20/15 1616  BP: 92/50  Pulse: 62  Temp: 98.3 F (36.8 C)    There is no weight on file to calculate BMI.  GENERAL: vitals reviewed and listed above, alert, oriented, appears well hydrated and in no acute distress  HEENT: atraumatic, conjunttiva clear, no obvious abnormalities on inspection of external nose and ears  NECK: no obvious masses on inspection  LUNGS: clear to auscultation bilaterally, no wheezes, rales or rhonchi, good air movement  CV: HRRR, no peripheral edema  MS: moves all extremities without noticeable abnormality  SKIN: healing wound on L lateral leg  with wound bed diameter approx 2x3 cm with improved appearance, minimal serosanguinous drainage, good granulation tissue mixed with fibrinous tissue. Edema and erythema of the L distal foot with blister on great toe, no sig TTP over the joints, this is somewhat improved from the last visit with less edema in the ankle and leg.  PSYCH: pleasant and cooperative, no obvious depression or anxiety  ASSESSMENT AND PLAN:  Discussed the following assessment and plan:  Wound, open, leg, left, subsequent encounter  Cellulitis of left lower extremity  -this looks like it is improving, I had another doc in the clinic take a look at the case and the patient today. We are in agreement it seems that the original wound is healing, but that she unfortunately developed a 2nd infection and cellulitis of the foot, likely related to the sunburn. -opted to hold the course on abx with close follow up and ER/UCC precautions advise if worsening over the weekend. -dressing applied and showed her how to change dressing -Patient advised to return or notify a doctor immediately if symptoms worsen or persist or new concerns arise.  Patient Instructions  Before you leave: Please apply nonstick bandage and give her some supplies to take home Schedule follow-up next week on Monday or Tuesday  Please continue the antibiotics, elevation and trying to stay off her feet.  Seek care immediately  If worsening, she drinking of redness of the leg, fevers, or you're feeling sick.  Keep the appointment with him in clinic.     Kriste BasqueKIM, [name]

## 2015-05-20 NOTE — Telephone Encounter (Signed)
Per pt called and asked if she is willing to go to a [place] office for her wound care she stated she  did not want to travel to [place] to far for her

## 2015-05-20 NOTE — Patient Instructions (Signed)
Before you leave: Please apply nonstick bandage and give her some supplies to take home Schedule follow-up next week on Monday or Tuesday  Please continue the antibiotics, elevation and trying to stay off her feet.  Seek care immediately  If worsening, she drinking of redness of the leg, fevers, or you're feeling sick.  Keep the appointment with him in clinic.

## 2015-05-20 NOTE — Telephone Encounter (Signed)
Okay. Please keep appointment as scheduled and see if they have a wait list to work in sooner if they have cancellation? Also, please check on when [facility] could see her if she changes her mind. We will be seeing her again on Monday or Tuesday. Thank you

## 2015-05-20 NOTE — Telephone Encounter (Signed)
This is the first available , unless the pt is willing to travel to their [place] office / or Dr call to speak with their physician about patient   Is this ok please advise  Pt scheduled for for 06/08/2015@12 :30 pm  With Dr Meyer RusselBritto  [facility] Address: [address], [place], [place] [address] Phone: [phone]

## 2015-05-20 NOTE — Addendum Note (Signed)
Encounter addended by: Governor Speckingeborah A Dawkins on: 05/20/2015  3:54 PM<BR>     Documentation filed: Demographics Visit

## 2015-05-20 NOTE — Telephone Encounter (Signed)
Please check with [place] office

## 2015-05-23 ENCOUNTER — Encounter: Payer: Self-pay | Admitting: Family Medicine

## 2015-05-23 ENCOUNTER — Ambulatory Visit (INDEPENDENT_AMBULATORY_CARE_PROVIDER_SITE_OTHER): Payer: Medicare Other | Admitting: Family Medicine

## 2015-05-23 VITALS — BP 124/52 | HR 75 | Temp 97.8°F | Ht 61.0 in | Wt 107.4 lb

## 2015-05-23 DIAGNOSIS — L03116 Cellulitis of left lower limb: Secondary | ICD-10-CM

## 2015-05-23 DIAGNOSIS — S80852D Superficial foreign body, left lower leg, subsequent encounter: Secondary | ICD-10-CM

## 2015-05-23 DIAGNOSIS — S90421A Blister (nonthermal), right great toe, initial encounter: Secondary | ICD-10-CM

## 2015-05-23 DIAGNOSIS — L559 Sunburn, unspecified: Secondary | ICD-10-CM | POA: Diagnosis not present

## 2015-05-23 DIAGNOSIS — S81802D Unspecified open wound, left lower leg, subsequent encounter: Secondary | ICD-10-CM

## 2015-05-23 MED ORDER — CEPHALEXIN 500 MG PO CAPS: 500.0000 mg | ORAL_CAPSULE | Freq: Two times a day (BID) | ORAL | Status: DC

## 2015-05-23 NOTE — Progress Notes (Signed)
HPI:  [name] is a pleasant 82 year old here for follow-up regarding a wound on her left leg and cellulitis of the leg and foot. She was treated with several courses of antibiotics and wound care and was improving. However she unfortunately, against the recommendations was in the side and while on doxycycline and suffered sunburn to both feet. She got an infection of the sunburn foot and this was treated recently with another course of Bactrim and Keflex. Now this has improved significantly and the redness, pain and swelling has resolved significantly and the left foot and leg. Unfortunately she now has developed a blister on the sunburned great toe of the right foot that is red and irritated. She has been wearing the first that rub on this area of the sunburn. Denies fevers, malaise, spreading redness or edema.  ROS: See pertinent positives and negatives per HPI.  Past Medical History  Diagnosis Date  . Hypertension   . Hyperlipemia 03/27/2007  . Pernicious anemia 10/25/2006  . Vitamin D deficiency 07/12/2006  . Polymyalgia rheumatica (HCC) 03/27/2007    sees Dr. Kellie Simmeringruslow in rheumatology  . Osteoporosis 07/12/2006  . Alcohol abuse   . Emphysema of lung (HCC) 01/26/2009    pt. unsure of this reports as of 07/31/2012  . Gout 08/11/2012  . Fracture of fifth metatarsal bone of right foot 08/29/2012    Past Surgical History  Procedure Laterality Date  . Appendectomy  07/12/2006  . Partial hip arthroplasty      left side- 2x's ( 2nd surgery to correct 1st replacement), R side -   . Tooth extraction N/A 08/04/2012    Procedure: EXTRACTIONS 17, 20;  Surgeon: Georgia LopesScott M Jensen, DDS;  Location: [facility] OR;  Service: Oral Surgery;  Laterality: N/A;    Family History  Problem Relation Age of Onset  . Mental illness Mother     alzheimer dementia  . Ulcers Father   . Heart disease Father   . Cancer Father     lung, smoker    Social History   Social History  . Marital Status: Married    Spouse  Name: N/A  . Number of Children: N/A  . Years of Education: N/A   Social History Main Topics  . Smoking status: Former Smoker    Quit date: 07/12/2006  . Smokeless tobacco: None  . Alcohol Use: 6.0 oz/week    10 Shots of liquor per week     Comment: quit completely - 2012  . Drug Use: No  . Sexual Activity: No   Other Topics Concern  . None   Social History Narrative   Work or School: none      Home Situation: lives with husband - drives, husband takes care of finances, dresses herself, takes care of housework and cooking      Spiritual Beliefs: [employer]      Lifestyle: uses cane when goes out, exercises sometimes - diet healthy              Current outpatient prescriptions:  .  alendronate (FOSAMAX) 70 MG tablet, Take 1 tablet (70 mg total) by mouth every 7 (seven) days., Disp: 12 tablet, Rfl: 3 .  CALCIUM PO, Take by mouth., Disp: , Rfl:  .  cephALEXin (KEFLEX) 500 MG capsule, Take 1 capsule (500 mg total) by mouth 2 (two) times daily., Disp: 6 capsule, Rfl: 0 .  Cyanocobalamin (VITAMIN B-12) 1000 MCG SUBL, Place under the tongue., Disp: , Rfl:  .  hydrochlorothiazide (HYDRODIURIL)  12.5 MG tablet, TAKE 1 BY MOUTH DAILY, Disp: 90 tablet, Rfl: 0 .  predniSONE (DELTASONE) 5 MG tablet, Take 2.5 mg by mouth daily. , Disp: , Rfl:   EXAM:  Filed Vitals:   05/23/15 1256  BP: 124/52  Pulse: 75  Temp: 97.8 F (36.6 C)    Body mass index is 20.3 kg/(m^2).  GENERAL: vitals reviewed and listed above, alert, oriented, appears well hydrated and in no acute distress  HEENT: atraumatic, conjunttiva clear, no obvious abnormalities on inspection of external nose and ears  NECK: no obvious masses on inspection  SKIN: Both feet are sunburned and stripes where her sandals not cover, the cellulitic changes of erythema and edema in the left foot are much improved, but weaned on the left leg continues to improve and does not appear to be infected, the right toe now has a  blister. The blister on left toe in this foot looks much better.  MS: moves all extremities without noticeable abnormality  PSYCH: pleasant and cooperative, no obvious depression or anxiety  ASSESSMENT AND PLAN:  Discussed the following assessment and plan:  Splinter of lower leg without major open wound, left, subsequent encounter  Sunburn  Cellulitis of left foot  Blister of great toe, right, initial encounter  Wound, open, leg, left, subsequent encounter - Plan: cephALEXin (KEFLEX) 500 MG capsule  Cellulitis of left lower extremity - Plan: cephALEXin (KEFLEX) 500 MG capsule  -Advised wear open toed footwear that does not rub on the sunburned area - she is somewhat headstrong and we spent some time in discussion regarding  the foot wear issue -Advised no sun exposure -She will finish her antibiotics tomorrow morning and we will continue Keflex for a few more days -Overall, it appears the infection has improved significantly and the wound on the left leg is healing - I do hope that she can avoid aggravating footwear so that the right toe does not become infected. -Patient advised to return or notify a doctor immediately if symptoms worsen or persist or new concerns arise.  Patient Instructions  Before you leave: -Please check see if there are any new available appointments for the wound care clinic and provide patient with a number to call to get on the wait list -Schedule follow-up in about 4-5 days  Complete the current antibiotics and continue the Keflex for 3 more days - this was sent to the pharmacy.   Elevate the leg at least 30 minutes twice daily and try to stay off her feet.  Avoid wearing any footwear that rubs on the sunburned area of your foot in the blister.  Avoid any sun exposure.  Keep feet clean and dry.  Plan to keep appointment with wound care clinic.     [name]

## 2015-05-23 NOTE — Telephone Encounter (Signed)
Pt has been sch

## 2015-05-23 NOTE — Progress Notes (Signed)
Pre visit review using our clinic review tool, if applicable. No additional management support is needed unless otherwise documented below in the visit note. 

## 2015-05-23 NOTE — Patient Instructions (Addendum)
Before you leave: -Please check see if there are any new available appointments for the wound care clinic and provide patient with a number to call to get on the wait list -Schedule follow-up in about 4-5 days  Complete the current antibiotics and continue the Keflex for 3 more days - this was sent to the pharmacy.   Elevate the leg at least 30 minutes twice daily and try to stay off her feet.  Avoid wearing any footwear that rubs on the sunburned area of your foot in the blister.  Avoid any sun exposure.  Keep feet clean and dry.  Plan to keep appointment with wound care clinic.

## 2015-05-24 ENCOUNTER — Ambulatory Visit: Payer: Medicare Other | Admitting: Family Medicine

## 2015-05-26 ENCOUNTER — Encounter: Payer: Medicare Other | Attending: Surgery | Admitting: Surgery

## 2015-05-26 DIAGNOSIS — L551 Sunburn of second degree: Secondary | ICD-10-CM | POA: Diagnosis not present

## 2015-05-26 DIAGNOSIS — Z87891 Personal history of nicotine dependence: Secondary | ICD-10-CM | POA: Insufficient documentation

## 2015-05-26 DIAGNOSIS — I10 Essential (primary) hypertension: Secondary | ICD-10-CM | POA: Diagnosis not present

## 2015-05-26 DIAGNOSIS — D510 Vitamin B12 deficiency anemia due to intrinsic factor deficiency: Secondary | ICD-10-CM | POA: Diagnosis not present

## 2015-05-26 DIAGNOSIS — L97511 Non-pressure chronic ulcer of other part of right foot limited to breakdown of skin: Secondary | ICD-10-CM | POA: Diagnosis not present

## 2015-05-26 DIAGNOSIS — S81802A Unspecified open wound, left lower leg, initial encounter: Secondary | ICD-10-CM | POA: Diagnosis not present

## 2015-05-26 DIAGNOSIS — Z79899 Other long term (current) drug therapy: Secondary | ICD-10-CM | POA: Diagnosis not present

## 2015-05-26 DIAGNOSIS — L97222 Non-pressure chronic ulcer of left calf with fat layer exposed: Secondary | ICD-10-CM | POA: Diagnosis not present

## 2015-05-26 DIAGNOSIS — L97521 Non-pressure chronic ulcer of other part of left foot limited to breakdown of skin: Secondary | ICD-10-CM | POA: Insufficient documentation

## 2015-05-26 DIAGNOSIS — M810 Age-related osteoporosis without current pathological fracture: Secondary | ICD-10-CM | POA: Insufficient documentation

## 2015-05-26 DIAGNOSIS — M109 Gout, unspecified: Secondary | ICD-10-CM | POA: Insufficient documentation

## 2015-05-26 DIAGNOSIS — T25231A Burn of second degree of right toe(s) (nail), initial encounter: Secondary | ICD-10-CM | POA: Diagnosis not present

## 2015-05-26 NOTE — Progress Notes (Addendum)
Shan LevansDMONDSON, Lounette T. ([id number]) Visit Report for 05/26/2015 Chief Complaint Document Details Calmes, Rosaline 05/26/2015 9:30 Patient Name: Date of Service: T. AM Medical Record Patient Account Number: [id number] Number: Treating RN: Phillis HaggisPinkerton, Debi Date of Birth/Sex: 1928-08-03 (82 y.o. Female) Other Clinician: Primary Care Physician: Herminio CommonsKIM, [name] Treating Evlyn KannerBritto, Rexine Gowens Referring Physician: Herminio CommonsKIM, [name] Physician/Extender: Tania AdeWeeks in Treatment: 0 Information Obtained from: Patient Chief Complaint Patient seen for complaints of Non-Healing Wound to the left lower extremity for about 2 months and to the toes on both feet for about 2 weeks Electronic Signature(s) Signed: 05/26/2015 12:10:31 PM By: Evlyn KannerBritto, Neel Buffone MD, FACS Entered By: Evlyn KannerBritto, Aislyn Hayse on 05/26/2015 12:10:30 Panzer, Anisah T. ([id number]) -------------------------------------------------------------------------------- Debridement Details Mongiello, Jla 05/26/2015 9:30 Patient Name: Date of Service: T. AM Medical Record Patient Account Number: [id number] Number: Treating RN: Phillis HaggisPinkerton, Debi Date of Birth/Sex: 1928-08-03 (82 y.o. Female) Other Clinician: Primary Care Physician: Herminio CommonsKIM, [name] Treating Hisako Bugh Referring Physician: Herminio CommonsKIM, [name] Physician/Extender: Tania AdeWeeks in Treatment: 0 Debridement Performed for Wound #1 Right,Dorsal Toe Great Assessment: Performed By: Physician Evlyn KannerBritto, Keaghan Bowens, MD Debridement: Open Wound/Selective Debridement Selective Description: Pre-procedure Yes Verification/Time Out Taken: Start Time: 11:06 Pain Control: Lidocaine 4% Topical Solution Level: Non-Viable Tissue Total Area Debrided (L x 0.1 (cm) x 0.1 (cm) = 0.01 (cm) W): Tissue and other Non-Viable, Skin material debrided: Instrument: Forceps, Scissors Bleeding: None Hemostasis Achieved: Pressure End Time: 11:07 Procedural Pain: 0 Post Procedural Pain: 0 Response to Treatment: Procedure was tolerated  well Post Debridement Measurements of Total Wound Length: (cm) 0.1 Width: (cm) 0.1 Depth: (cm) 0.1 Volume: (cm) 0.001 Post Procedure Diagnosis Same as Pre-procedure Electronic Signature(s) Signed: 05/26/2015 12:09:24 PM By: Evlyn KannerBritto, Stephanee Barcomb MD, FACS Signed: 05/26/2015 5:52:45 PM By: Alejandro MullingPinkerton, Debra Entered By: Evlyn KannerBritto, Connell Bognar on 05/26/2015 12:09:23 Trostle, Alanda T. ([id number]) Guglielmo, Morrigan T. ([id number]) -------------------------------------------------------------------------------- Debridement Details Causey, Nelida 05/26/2015 9:30 Patient Name: Date of Service: T. AM Medical Record Patient Account Number: [id number] Number: Treating RN: Phillis HaggisPinkerton, Debi Date of Birth/Sex: 1928-08-03 (82 y.o. Female) Other Clinician: Primary Care Physician: Herminio CommonsKIM, [name] Treating Heavan Francom Referring Physician: Herminio CommonsKIM, [name] Physician/Extender: Tania AdeWeeks in Treatment: 0 Debridement Performed for Wound #2 Left,Medial Lower Leg Assessment: Performed By: Physician Evlyn KannerBritto, Raizy Auzenne, MD Debridement: Debridement Pre-procedure Yes Verification/Time Out Taken: Start Time: 11:07 Pain Control: Lidocaine 4% Topical Solution Level: Skin/Subcutaneous Tissue Total Area Debrided (L x 3 (cm) x 2.8 (cm) = 8.4 (cm) W): Tissue and other Viable, Non-Viable, Exudate, Fibrin/Slough, Skin, Subcutaneous material debrided: Instrument: Forceps, Scissors Bleeding: None Hemostasis Achieved: Pressure End Time: 11:10 Procedural Pain: 0 Post Procedural Pain: 0 Response to Treatment: Procedure was tolerated well Post Debridement Measurements of Total Wound Length: (cm) 3 Width: (cm) 2.8 Depth: (cm) 0.1 Volume: (cm) 0.66 Post Procedure Diagnosis Same as Pre-procedure Electronic Signature(s) Signed: 05/26/2015 12:09:37 PM By: Evlyn KannerBritto, Montarius Kitagawa MD, FACS Signed: 05/26/2015 5:52:45 PM By: Alejandro MullingPinkerton, Debra Entered By: Evlyn KannerBritto, Ashritha Desrosiers on 05/26/2015 12:09:37 Mader, Mckenze T.  ([id number]) -------------------------------------------------------------------------------- HPI Details Klaiber, Laquan 05/26/2015 9:30 Patient Name: Date of Service: T. AM Medical Record Patient Account Number: [id number] Number: Treating RN: Phillis HaggisPinkerton, Debi Date of Birth/Sex: 1928-08-03 (82 y.o. Female) Other Clinician: Primary Care Physician: Herminio CommonsKIM, [name] Treating Evlyn KannerBritto, Elysha Daw Referring Physician: Herminio CommonsKIM, [name] Physician/Extender: Tania AdeWeeks in Treatment: 0 History of Present Illness Location: left lower extremity and both right and left big toes Quality: Patient reports experiencing a dull pain to affected area(s). Severity: Patient states wound are getting worse. Duration: Patient has had the wound for > 2 months prior to seeking treatment at the  wound center Timing: Pain in wound is Intermittent (comes and goes Context: The wound occurred when the patient had a lacerated wound to her left lateral calf about 2 months ago with a blunt injury and then sunburnt her feet about 2 weeks ago Modifying Factors: Other treatment(s) tried include:antibiotics and local care Associated Signs and Symptoms: Patient reports having increase swelling. HPI Description: 82 year old patient with a past medical history of essential hypertension, osteoporosis, vitamin D deficiency, pernicious anemia. she was recently seen by her PCP Dr. Herold Harms for a wound on the left leg with cellulitis of the leg and foot. she is also status post appendectomy, left hip arthroplasty.She has been treated with several courses of antibiotics and wound care and was improving but then went and had a sunburn to both feet and got infected and had another course of Bactrim and Keflex. she had a blister on the right big toe that was red and inflamed. she is also had some recent x-rays had an x-ray of the left ankle which showed no acute osseous findings are also had x-ray of the left tibia and fibula which was a normal tibia  and fibula. on 05/03/2015 she had a venous duplex study of her left lower extremity which showed no evidence of deep or superficial venous thrombosis or incompetence. She is a former smoker and quit in 2008 June Electronic Signature(s) Signed: 05/26/2015 12:11:41 PM By: Evlyn Kanner MD, FACS Previous Signature: 05/26/2015 10:07:24 AM Version By: Evlyn Kanner MD, FACS Previous Signature: 05/26/2015 10:06:09 AM Version By: Evlyn Kanner MD, FACS Entered By: Evlyn Kanner on 05/26/2015 12:11:41 Dupriest, Adaiah T. ([id number]) -------------------------------------------------------------------------------- Physical Exam Details Vickroy, Hoda 05/26/2015 9:30 Patient Name: Date of Service: T. AM Medical Record Patient Account Number: [id number] Number: Treating RN: Phillis Haggis Date of Birth/Sex: [birth year] (82 y.o. Female) Other Clinician: Primary Care Physician: Herminio Commons Treating Evlyn Kanner Referring Physician: Herminio Commons Physician/Extender: Weeks in Treatment: 0 Constitutional . Pulse regular. Respirations normal and unlabored. Afebrile. . Eyes Nonicteric. Reactive to light. Ears, Nose, Mouth, and Throat Lips, teeth, and gums WNL.Marland Kitchen Moist mucosa without lesions. Neck supple and nontender. No palpable supraclavicular or cervical adenopathy. Normal sized without goiter. Respiratory WNL. No retractions.. Cardiovascular Pedal Pulses WNL. ABI on the left is 1.01 and on the right is 0.94. No clubbing, cyanosis or edema. Gastrointestinal (GI) Abdomen without masses or tenderness.. No liver or spleen enlargement or tenderness.. Lymphatic No adneopathy. No adenopathy. No adenopathy. Musculoskeletal Adexa without tenderness or enlargement.. Digits and nails w/o clubbing, cyanosis, infection, petechiae, ischemia, or inflammatory conditions.. Integumentary (Hair, Skin) No suspicious lesions. No crepitus or fluctuance. No peri-wound warmth or erythema. No  masses.Marland Kitchen Psychiatric Judgement and insight Intact.. No evidence of depression, anxiety, or agitation.. Notes on her left lower extremity in the lateral calf area she had a lot of eschar with necrotic debris down to the subcutaneous tissue and this was sharply debrided with a forcep and scissor. Bleeding was controlled with pressure. On the right big toe she's got a sunburn with a rather large bleb and I'll gently open this with forceps and scissors and let some of the seroma out and left the skin intact. Electronic Signature(s) Signed: 05/26/2015 12:13:00 PM By: Evlyn Kanner MD, FACS Entered By: Evlyn Kanner on 05/26/2015 12:12:59 Madero, Lucina TMarland Kitchen ([id number]) Dimiceli, Rhenda TMarland Kitchen ([id number]) -------------------------------------------------------------------------------- Physician Orders Details Broski, Yennifer Date of Service: 05/26/2015 9:30 AM Patient Name: T. Patient Account Number: [id number] Medical Record Clover Mealy, RN, [name], [id number] Treating RN: Number: Kachemak Sink Date  of Birth/Sex: 06-02-1928 (82 y.o. Female) Other Clinician: Primary Care Treating [name], [name] Remer Couse Physician: Physician/Extender: Referring Physician: Lucienne Minks in Treatment: 0 Verbal / Phone Orders: Yes Clinician: Afful, RN, [name], Rita Read Back and Verified: Yes Diagnosis Coding Wound Cleansing Wound #1 Right,Dorsal Toe Great o Cleanse wound with mild soap and water o May Shower, gently pat wound dry prior to applying new dressing. o May shower with protection. Wound #3 Left,Dorsal Toe Great o Cleanse wound with mild soap and water o May Shower, gently pat wound dry prior to applying new dressing. o May shower with protection. Wound #2 Left,Medial Lower Leg o Cleanse wound with mild soap and water o May Shower, gently pat wound dry prior to applying new dressing. o May shower with protection. Primary Wound Dressing Wound #1 Right,Dorsal Toe Great o Silvadene  Cream Wound #3 Left,Dorsal Toe Great o Silvadene Cream Wound #2 Left,Medial Lower Leg o Aquacel Ag Secondary Dressing Wound #1 Right,Dorsal Toe Great o Gauze and Kerlix/Conform Wound #3 Left,Dorsal Toe Great o Gauze and Kerlix/Conform Wound #2 Left,Medial Lower Leg Bill, Bridgid T. ([id number]) o Gauze and Kerlix/Conform Dressing Change Frequency Wound #1 Right,Dorsal Toe Great o Change dressing every day. Wound #3 Left,Dorsal Toe Great o Change dressing every day. Wound #2 Left,Medial Lower Leg o Change dressing every other day. Follow-up Appointments Wound #1 Right,Dorsal Toe Great o Return Appointment in 1 week. Wound #3 Left,Dorsal Toe Great o Return Appointment in 1 week. Wound #2 Left,Medial Lower Leg o Return Appointment in 1 week. Edema Control Wound #1 Right,Dorsal Toe Great o Elevate legs to the level of the heart and pump ankles as often as possible Wound #3 Left,Dorsal Toe Great o Elevate legs to the level of the heart and pump ankles as often as possible Wound #2 Left,Medial Lower Leg o Elevate legs to the level of the heart and pump ankles as often as possible Additional Orders / Instructions Wound #1 Right,Dorsal Toe Great o Increase protein intake. o Activity as tolerated Wound #3 Left,Dorsal Toe Great o Increase protein intake. o Activity as tolerated Wound #2 Left,Medial Lower Leg o Increase protein intake. o Activity as tolerated Electronic Signature(s) Howerter, Alejandria T. ([id number]) Signed: 05/26/2015 11:36:31 AM By: Elpidio Eric [name], RN Signed: 05/26/2015 4:29:13 PM By: Evlyn Kanner MD, FACS Entered By: Elpidio Eric on 05/26/2015 11:36:30 Poet, Desteni TMarland Kitchen ([id number]) -------------------------------------------------------------------------------- Problem List Details Prestridge, Maleny 05/26/2015 9:30 Patient Name: Date of Service: T. AM Medical Record Patient Account Number:  [id number] Number: Treating RN: Phillis Haggis Date of Birth/Sex: 03-21-1928 (82 y.o. Female) Other Clinician: Primary Care Physician: Herminio Commons Treating Evlyn Kanner Referring Physician: Herminio Commons Physician/Extender: Tania Ade in Treatment: 0 Active Problems ICD-10 Encounter Code Description Active Date Diagnosis L97.222 Non-pressure chronic ulcer of left calf with fat layer 05/26/2015 Yes exposed L55.1 Sunburn of second degree 05/26/2015 Yes L97.511 Non-pressure chronic ulcer of other part of right foot 05/26/2015 Yes limited to breakdown of skin L97.521 Non-pressure chronic ulcer of other part of left foot limited 05/26/2015 Yes to breakdown of skin Inactive Problems Resolved Problems Electronic Signature(s) Signed: 05/26/2015 12:09:06 PM By: Evlyn Kanner MD, FACS Entered By: Evlyn Kanner on 05/26/2015 12:09:06 Brierley, Kahliyah T. ([id number]) -------------------------------------------------------------------------------- Progress Note Details Didion, Aviance 05/26/2015 9:30 Patient Name: Date of Service: T. AM Medical Record Patient Account Number: [id number] Number: Treating RN: Phillis Haggis Date of Birth/Sex: November 20, 1928 (82 y.o. Female) Other Clinician: Primary Care Physician: Herminio Commons Treating Evlyn Kanner Referring Physician: Herminio Commons Physician/Extender: Tania Ade  in Treatment: 0 Subjective Chief Complaint Information obtained from Patient Patient seen for complaints of Non-Healing Wound to the left lower extremity for about 2 months and to the toes on both feet for about 2 weeks History of Present Illness (HPI) The following HPI elements were documented for the patient's wound: Location: left lower extremity and both right and left big toes Quality: Patient reports experiencing a dull pain to affected area(s). Severity: Patient states wound are getting worse. Duration: Patient has had the wound for > 2 months prior to seeking treatment at the  wound center Timing: Pain in wound is Intermittent (comes and goes Context: The wound occurred when the patient had a lacerated wound to her left lateral calf about 2 months ago with a blunt injury and then sunburnt her feet about 2 weeks ago Modifying Factors: Other treatment(s) tried include:antibiotics and local care Associated Signs and Symptoms: Patient reports having increase swelling. 82 year old patient with a past medical history of essential hypertension, osteoporosis, vitamin D deficiency, pernicious anemia. she was recently seen by her PCP Dr. Herold Harms for a wound on the left leg with cellulitis of the leg and foot. she is also status post appendectomy, left hip arthroplasty.She has been treated with several courses of antibiotics and wound care and was improving but then went and had a sunburn to both feet and got infected and had another course of Bactrim and Keflex. she had a blister on the right big toe that was red and inflamed. she is also had some recent x-rays had an x-ray of the left ankle which showed no acute osseous findings are also had x-ray of the left tibia and fibula which was a normal tibia and fibula. on 05/03/2015 she had a venous duplex study of her left lower extremity which showed no evidence of deep or superficial venous thrombosis or incompetence. She is a former smoker and quit in 2008 June Wound History Patient presents with 2 open wounds that have been present for approximately 2 mos, and 3 weeks ago. Patient has been treating wounds in the following manner: pt states a cream not sure. Laboratory tests have not been performed in the last month. Patient reportedly has not tested positive for an antibiotic resistant organism. Patient reportedly has not tested positive for osteomyelitis. Patient reportedly has not had testing Lever, Kate T. ([id number]) performed to evaluate circulation in the legs. Patient experiences the following problems  associated with their wounds: infection, swelling. Patient History Information obtained from Patient, . Allergies aspirin (Severity: Severe, Reaction: bleeding, vomiting), codeine (Severity: Severe, Reaction: vomiting), naproxen (Severity: Severe, Reaction: pain in joints), oxycodone-acetaminophen (Severity: Severe) Family History Cancer - Father, Heart Disease - Father, Hypertension - Father, Lung Disease - Father, Stroke - Child, No family history of Diabetes, Hereditary Spherocytosis, Kidney Disease, Seizures, Thyroid Problems, Tuberculosis. Social History Former smoker - quit 2008, Marital Status - Married, Alcohol Use - Never, Drug Use - No History, Caffeine Use - Daily. Medical History Eyes Patient has history of Cataracts - surgery Hematologic/Lymphatic Patient has history of Anemia Cardiovascular Patient has history of Hypertension Integumentary (Skin) Patient has history of History of Burn Musculoskeletal Patient has history of Gout, Osteoarthritis Review of Systems (ROS) Constitutional Symptoms (General Health) The patient has no complaints or symptoms. Eyes Complains or has symptoms of Glasses / Contacts - glasses. Ear/Nose/Mouth/Throat The patient has no complaints or symptoms, HOH Respiratory The patient has no complaints or symptoms, emphysemia Cardiovascular hyperlipidemia Gastrointestinal Complains or has symptoms of Frequent diarrhea. Endocrine  The patient has no complaints or symptoms. Genitourinary The patient has no complaints or symptoms. Immunological The patient has no complaints or symptoms. Marcella, Kambra T. ([id number]) Integumentary (Skin) Complains or has symptoms of Wounds. 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 Oncologic The patient has no complaints or symptoms. Psychiatric The patient has no complaints or  symptoms. Medications calcium as directed Vitamin B-12 sublingual sublingual as directed sulfamethoxazole 800 mg-trimethoprim 160 mg tablet oral 1 1 tablet oral two times daily alendronate 70 mg tablet oral 1 1 tablet oral every seven days cephalexin 500 mg capsule oral 1 1 capsule oral two times daily prednisone 2.5 mg tablet oral 1 1 tablet oral daily hydrochlorothiazide 12.5 mg tablet oral 1 1 tablet oral daily Objective Constitutional Pulse regular. Respirations normal and unlabored. Afebrile. Vitals Time Taken: 9:56 AM, Height: 61 in, Source: Stated, Weight: 108 lbs, Source: Stated, BMI: 20.4, Temperature: 97.5 F, Pulse: 73 bpm, Respiratory Rate: 20 breaths/min, Blood Pressure: 139/99 mmHg. Eyes Nonicteric. Reactive to light. Ears, Nose, Mouth, and Throat Lips, teeth, and gums WNL.Marland Kitchen Moist mucosa without lesions. Neck supple and nontender. No palpable supraclavicular or cervical adenopathy. Normal sized without goiter. Respiratory WNL. No retractions.. Cardiovascular Riggio, Naidelyn T. ([id number]) Pedal Pulses WNL. ABI on the left is 1.01 and on the right is 0.94. No clubbing, cyanosis or edema. Gastrointestinal (GI) Abdomen without masses or tenderness.. No liver or spleen enlargement or tenderness.. Lymphatic No adneopathy. No adenopathy. No adenopathy. Musculoskeletal Adexa without tenderness or enlargement.. Digits and nails w/o clubbing, cyanosis, infection, petechiae, ischemia, or inflammatory conditions.Marland Kitchen Psychiatric Judgement and insight Intact.. No evidence of depression, anxiety, or agitation.. General Notes: on her left lower extremity in the lateral calf area she had a lot of eschar with necrotic debris down to the subcutaneous tissue and this was sharply debrided with a forcep and scissor. Bleeding was controlled with pressure. On the right big toe she's got a sunburn with a rather large bleb and I'll gently open this with forceps and scissors and let some  of the seroma out and left the skin intact. Integumentary (Hair, Skin) No suspicious lesions. No crepitus or fluctuance. No peri-wound warmth or erythema. No masses.. Wound #1 status is Open. Original cause of wound was Blister. The wound is located on the Right,Dorsal [employer]. The wound measures 0.1cm length x 0.1cm width x 0.1cm depth; 0.008cm^2 area and 0.001cm^3 volume. The wound is limited to skin breakdown. There is no tunneling or undermining noted. There is a large amount of serous drainage noted. The wound margin is flat and intact. There is no granulation within the wound bed. There is a large (67-100%) amount of necrotic tissue within the wound bed including Eschar. The periwound skin appearance exhibited: Localized Edema, Moist, Erythema. The surrounding wound skin color is noted with erythema which is circumferential. Periwound temperature was noted as No Abnormality. The periwound has tenderness on palpation. Wound #2 status is Open. Original cause of wound was Trauma. The wound is located on the Left,Medial Lower Leg. The wound measures 3cm length x 2.8cm width x 0.1cm depth; 6.597cm^2 area and 0.66cm^3 volume. The wound is limited to skin breakdown. There is no tunneling or undermining noted. There is a large amount of serosanguineous drainage noted. The wound margin is thickened. There is no granulation within the wound bed. There is a large (67-100%) amount of necrotic tissue within the wound bed including Eschar. The periwound skin  appearance exhibited: Localized Edema. Periwound temperature was noted as No Abnormality. The periwound has tenderness on palpation. Wound #3 status is Open. Original cause of wound was Blister. The wound is located on the [employer]. The wound measures 0.1cm length x 0.1cm width x 0.1cm depth; 0cm^2 area and 0cm^3 volume. The wound is limited to skin breakdown. There is no tunneling or undermining noted. There is a small amount of  serosanguineous drainage noted. The wound margin is distinct with the outline attached to the wound base. There is no granulation within the wound bed. There is a small (1-33%) amount of necrotic tissue within the wound bed including Adherent Slough. The periwound skin appearance exhibited: Maceration, Moist. The periwound skin appearance did not exhibit: Callus, Crepitus, Excoriation, Fluctuance, Friable, Induration, Localized Edema, Rash, Scarring, Dry/Scaly, Atrophie Blanche, Cyanosis, Ecchymosis, Hemosiderin Staining, Mottled, Pallor, Rubor, Erythema. Periwound temperature was noted as Comrie, Margurete T. ([id number]) No Abnormality. The periwound has tenderness on palpation. Assessment Active Problems ICD-10 L97.222 - Non-pressure chronic ulcer of left calf with fat layer exposed L55.1 - Sunburn of second degree L97.511 - Non-pressure chronic ulcer of other part of right foot limited to breakdown of skin L97.521 - Non-pressure chronic ulcer of other part of left foot limited to breakdown of skin this 82 year old patient who had a lacerated wound to the left lateral calf and sunburns to her feet has had a venous study done which did not show any evidence of incompetence or DVT. I have recommended: 1. Silvadene ointment to the toes and a light dressing over this and offloading it as much as possible 2. Aquacel Ag to the left lower extremity and a light Kerlix bandage to keep in place as the skin is very fragile due to being on steroids long-term 3. Elevation and exercise. 4. regular visits to the wound center. she and her daughter have had all QUESTIONS answered Procedures Wound #1 Wound #1 is a 2nd degree Burn located on the Right,Dorsal Toe Great . There was a Non-Viable Tissue Open Wound/Selective [phone]) debridement with total area of 0.01 sq cm performed by Evlyn Kanner, MD. with the following instrument(s): Forceps and Scissors to remove Non-Viable tissue/material  including Skin after achieving pain control using Lidocaine 4% Topical Solution. A time out was conducted prior to the start of the procedure. There was no bleeding. The procedure was tolerated well with a pain level of 0 throughout and a pain level of 0 following the procedure. Post Debridement Measurements: 0.1cm length x 0.1cm width x 0.1cm depth; 0.001cm^3 volume. Post procedure Diagnosis Wound #1: Same as Pre-Procedure Wound #2 Wound #2 is a Trauma, Other located on the Left,Medial Lower Leg . There was a Skin/Subcutaneous Tissue Debridement ([id number]) debridement with total area of 8.4 sq cm performed by Evlyn Kanner, MD. Arlice Colt, Quiana T. ([id number]) with the following instrument(s): Forceps and Scissors to remove Viable and Non-Viable tissue/material including Exudate, Fibrin/Slough, Skin, and Subcutaneous after achieving pain control using Lidocaine 4% Topical Solution. A time out was conducted prior to the start of the procedure. There was no bleeding. The procedure was tolerated well with a pain level of 0 throughout and a pain level of 0 following the procedure. Post Debridement Measurements: 3cm length x 2.8cm width x 0.1cm depth; 0.66cm^3 volume. Post procedure Diagnosis Wound #2: Same as Pre-Procedure Plan Wound Cleansing: Wound #1 Right,Dorsal Toe Great: Cleanse wound with mild soap and water May Shower, gently pat wound dry prior to applying new dressing. May shower with protection. Wound #  3 Left,Dorsal Toe Great: Cleanse wound with mild soap and water May Shower, gently pat wound dry prior to applying new dressing. May shower with protection. Wound #2 Left,Medial Lower Leg: Cleanse wound with mild soap and water May Shower, gently pat wound dry prior to applying new dressing. May shower with protection. Primary Wound Dressing: Wound #1 Right,Dorsal Toe Great: Silvadene Cream Wound #3 Left,Dorsal Toe Great: Silvadene Cream Wound #2 Left,Medial Lower  Leg: Aquacel Ag Secondary Dressing: Wound #1 Right,Dorsal Toe Great: Gauze and Kerlix/Conform Wound #3 Left,Dorsal Toe Great: Gauze and Kerlix/Conform Wound #2 Left,Medial Lower Leg: Gauze and Kerlix/Conform Dressing Change Frequency: Wound #1 Right,Dorsal Toe Great: Change dressing every day. Wound #3 Left,Dorsal Toe Great: Change dressing every day. Wound #2 Left,Medial Lower Leg: Change dressing every other day. Follow-up Appointments: Wound #1 Right,Dorsal Toe Great: Bohanon, Hether T. ([id number]) Return Appointment in 1 week. Wound #3 Left,Dorsal Toe Great: Return Appointment in 1 week. Wound #2 Left,Medial Lower Leg: Return Appointment in 1 week. Edema Control: Wound #1 Right,Dorsal Toe Great: Elevate legs to the level of the heart and pump ankles as often as possible Wound #3 Left,Dorsal Toe Great: Elevate legs to the level of the heart and pump ankles as often as possible Wound #2 Left,Medial Lower Leg: Elevate legs to the level of the heart and pump ankles as often as possible Additional Orders / Instructions: Wound #1 Right,Dorsal Toe Great: Increase protein intake. Activity as tolerated Wound #3 Left,Dorsal Toe Great: Increase protein intake. Activity as tolerated Wound #2 Left,Medial Lower Leg: Increase protein intake. Activity as tolerated this 82 year old patient who had a lacerated wound to the left lateral calf and sunburns to her feet has had a venous study done which did not show any evidence of incompetence or DVT. I have recommended: 1. Silvadene ointment to the toes and a light dressing over this and offloading it as much as possible 2. Aquacel Ag to the left lower extremity and a light Kerlix bandage to keep in place as the skin is very fragile due to being on steroids long-term 3. Elevation and exercise. 4. regular visits to the wound center. she and her daughter have had all QUESTIONS answered Electronic Signature(s) Signed: 05/26/2015  12:14:52 PM By: Evlyn Kanner MD, FACS Entered By: Evlyn Kanner on 05/26/2015 12:14:51 Char, Neysa T. ([id number]) -------------------------------------------------------------------------------- ROS/PFSH Details Marez, Helene 05/26/2015 9:30 Patient Name: Date of Service: T. AM Medical Record Patient Account Number: [id number] Number: Treating RN: Phillis Haggis Date of Birth/Sex: 1928-01-27 (82 y.o. Female) Other Clinician: Primary Care Physician: Herminio Commons Treating Sasha Rogel Referring Physician: Herminio Commons Physician/Extender: Tania Ade in Treatment: 0 Information Obtained From Patient Other: Wound History Do you currently have one or more open woundso Yes How many open wounds do you currently haveo 2 Approximately how long have you had your woundso 2 mos, and 3 weeks ago How have you been treating your wound(s) until nowo pt states a cream not sure Has your wound(s) ever healed and then re-openedo No Have you had any lab work done in the past montho No Have you tested positive for an antibiotic resistant organism (MRSA, No VRE)o Have you tested positive for osteomyelitis (bone infection)o No Have you had any tests for circulation on your legso No Have you had other problems associated with your woundso Infection, Swelling Eyes Complaints and Symptoms: Positive for: Glasses / Contacts - glasses Medical History: Positive for: Cataracts - surgery Gastrointestinal Complaints and Symptoms: Positive for: Frequent diarrhea Integumentary (Skin) Complaints and Symptoms:  Positive for: Wounds Medical History: Positive for: History of Burn Constitutional Symptoms (General Health) Solt, Everlyn T. ([id number]) Complaints and Symptoms: No Complaints or Symptoms Ear/Nose/Mouth/Throat Complaints and Symptoms: No Complaints or Symptoms Complaints and Symptoms: Review of System Notes: HOH Hematologic/Lymphatic Medical History: Positive for:  Anemia Respiratory Complaints and Symptoms: No Complaints or Symptoms Complaints and Symptoms: Review of System Notes: emphysemia Cardiovascular Complaints and Symptoms: Review of System Notes: hyperlipidemia Medical History: Positive for: Hypertension Endocrine Complaints and Symptoms: No Complaints or Symptoms Genitourinary Complaints and Symptoms: No Complaints or Symptoms Immunological Complaints and Symptoms: No Complaints or Symptoms Musculoskeletal Fazzino, Albertha T. 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 Medical History: Positive for: Gout; Osteoarthritis Neurologic Complaints and Symptoms: No Complaints or Symptoms Oncologic Complaints and Symptoms: No Complaints or Symptoms Psychiatric Complaints and Symptoms: No Complaints or Symptoms HBO Extended History Items Eyes: Cataracts Family and Social History Cancer: Yes - Father; Diabetes: No; Heart Disease: Yes - Father; Hereditary Spherocytosis: No; Hypertension: Yes - Father; Kidney Disease: No; Lung Disease: Yes - Father; Seizures: No; Stroke: Yes - Child; Thyroid Problems: No; Tuberculosis: No; Former smoker - quit 2008; Marital Status - Married; Alcohol Use: Never; Drug Use: No History; Caffeine Use: Daily; Financial Concerns: No; Food, Clothing or Shelter Needs: No; Support System Lacking: No; Transportation Concerns: No; Advanced Directives: No; Patient does not want information on Advanced Directives; Do not resuscitate: No; Living Will: Yes (Not Provided); Medical Power of Attorney: Yes - Meta Hatchet (Not Provided) Physician Affirmation I have reviewed and agree with the above information. Electronic Signature(s) Signed: 05/26/2015 10:51:07 AM By: Evlyn Kanner MD, FACS Signed: 05/26/2015 5:52:45 PM By: Alejandro Mulling Entered By: Evlyn Kanner on 05/26/2015 10:51:07 Roy, Erva T.  ([id number]) -------------------------------------------------------------------------------- SuperBill Details Patient Name: Imperato, Bryanah T. Date of Service: 05/26/2015 Medical Record Number: [id number] Patient Account Number: [id number] Date of Birth/Sex: 1929-01-03 (82 y.o. Female) Treating RN: Ashok Cordia, Debi Primary Care Physician: Herminio Commons Other Clinician: Referring Physician: Herminio Commons Treating Physician/Extender: Rudene Re in Treatment: 0 Diagnosis Coding ICD-10 Codes Code Description [phone] Non-pressure chronic ulcer of left calf with fat layer exposed L55.1 Sunburn of second degree L97.511 Non-pressure chronic ulcer of other part of right foot limited to breakdown of skin L97.521 Non-pressure chronic ulcer of other part of left foot limited to breakdown of skin Facility Procedures CPT4: Description Modifier Quantity Code [id number] - WOUND CARE VISIT-LEV 3 EST PT 1 CPT4: 40347425 11042 - DEB SUBQ TISSUE 20 SQ CM/< 1 ICD-10 Description Diagnosis L97.222 Non-pressure chronic ulcer of left calf with fat layer exposed L55.1 Sunburn of second degree L97.511 Non-pressure chronic ulcer of other part of right foot limited  to breakdown of skin L97.521 Non-pressure chronic ulcer of other part of left foot limited to breakdown of skin CPT4: 95638756 97597 - DEBRIDE WOUND 1ST 20 SQ CM OR < 59 1 ICD-10 Description Diagnosis L97.222 Non-pressure chronic ulcer of left calf with fat layer exposed L55.1 Sunburn of second degree L97.511 Non-pressure chronic ulcer of other part of right foot  limited to breakdown of skin L97.521 Non-pressure chronic ulcer of other part of left foot limited to breakdown of skin Physician Procedures CPT4: Description Modifier Quantity Code MADELLYN, DENIO T. ([id number]) Electronic Signature(s) Signed: 05/26/2015 12:15:26 PM By: Evlyn Kanner MD, FACS Entered By: Evlyn Kanner on 05/26/2015 12:15:26

## 2015-05-27 ENCOUNTER — Ambulatory Visit: Payer: Medicare Other | Admitting: Family Medicine

## 2015-05-27 NOTE — Progress Notes (Signed)
[name], [name] ([id number]) Visit Report for 05/26/2015 Abuse/Suicide Risk Screen Details [name], [name] 05/26/2015 9:30 Patient Name: Date of Service: T. AM Medical Record Patient Account Number: [id number] Number: Treating RN: Phillis HaggisPinkerton, Debi Date of Birth/Sex: 1929-01-19 (82 y.o. Female) Other Clinician: Primary Care Physician: Herminio CommonsKIM, [name] Treating Britto, Errol Referring Physician: Herminio CommonsKIM, [name] Physician/Extender: Tania AdeWeeks in Treatment: 0 Abuse/Suicide Risk Screen Items Answer ABUSE/SUICIDE RISK SCREEN: Has anyone close to you tried to hurt or harm you recentlyo No Do you feel uncomfortable with anyone in your familyo No Has anyone forced you do things that you didnot want to doo No Do you have any thoughts of harming yourselfo No Patient displays signs or symptoms of abuse and/or neglect. No Electronic Signature(s) Signed: 05/26/2015 5:52:45 PM By: Alejandro MullingPinkerton, Debra Entered By: Alejandro MullingPinkerton, Debra on 05/26/2015 10:15:07 Nolasco, Mishel T. ([id number]) -------------------------------------------------------------------------------- Activities of Daily Living Details [name], [name] 05/26/2015 9:30 Patient Name: Date of Service: T. AM Medical Record Patient Account Number: [id number] Number: Treating RN: Phillis HaggisPinkerton, Debi Date of Birth/Sex: 1929-01-19 (82 y.o. Female) Other Clinician: Primary Care Physician: Herminio CommonsKIM, [name] Treating Evlyn KannerBritto, Errol Referring Physician: Herminio CommonsKIM, [name] Physician/Extender: Tania AdeWeeks in Treatment: 0 Activities of Daily Living Items Answer Activities of Daily Living (Please select one for each item) Drive Automobile Completely Able Take Medications Completely Able Use Telephone Completely Able Care for Appearance Completely Able Use Toilet Completely Able Bath / Shower Completely Able Dress Self Completely Able Feed Self Completely Able Walk Need Assistance Get In / Out Bed Completely Able Housework Completely Able Prepare Meals  Completely Able Handle Money Completely Able Shop for Self Completely Able Electronic Signature(s) Signed: 05/26/2015 5:52:45 PM By: Alejandro MullingPinkerton, Debra Entered By: Alejandro MullingPinkerton, Debra on 05/26/2015 10:15:38 Pruett, Jearldean T. ([id number]) -------------------------------------------------------------------------------- Education Assessment Details [name], [name] 05/26/2015 9:30 Patient Name: Date of Service: T. AM Medical Record Patient Account Number: [id number] Number: Treating RN: Phillis HaggisPinkerton, Debi Date of Birth/Sex: 1929-01-19 (82 y.o. Female) Other Clinician: Primary Care Physician: Herminio CommonsKIM, [name] Treating Evlyn KannerBritto, Errol Referring Physician: Herminio CommonsKIM, [name] Physician/Extender: Tania AdeWeeks in Treatment: 0 Primary Learner Assessed: Patient Learning Preferences/Education Level/Primary Language Learning Preference: Explanation, Printed Material Highest Education Level: College or Above Preferred Language: English Cognitive Barrier Assessment/Beliefs Language Barrier: No Translator Needed: No Memory Deficit: No Emotional Barrier: No Cultural/Religious Beliefs Affecting Medical No Care: Physical Barrier Assessment Impaired Vision: Yes Glasses Impaired Hearing: Yes HOH Decreased Hand dexterity: No Knowledge/Comprehension Assessment Knowledge Level: High Comprehension Level: High Ability to understand written High instructions: Ability to understand verbal High instructions: Motivation Assessment Anxiety Level: Calm Cooperation: Cooperative Education Importance: Acknowledges Need Interest in Health Problems: Asks Questions Perception: Coherent Willingness to Engage in Self- High Management Activities: Readiness to Engage in Self- High Management Activities: [name], [name] ([id number]) Electronic Signature(s) Signed: 05/26/2015 5:52:45 PM By: Alejandro MullingPinkerton, Debra Entered By: Alejandro MullingPinkerton, Debra on 05/26/2015 10:16:09 Carlberg, Angle T.  ([id number]) -------------------------------------------------------------------------------- Fall Risk Assessment Details [name], [name] 05/26/2015 9:30 Patient Name: Date of Service: T. AM Medical Record Patient Account Number: [id number] Number: Treating RN: Phillis HaggisPinkerton, Debi Date of Birth/Sex: 1929-01-19 (82 y.o. Female) Other Clinician: Primary Care Physician: Herminio CommonsKIM, [name] Treating Britto, Errol Referring Physician: Herminio CommonsKIM, [name] Physician/Extender: Tania AdeWeeks in Treatment: 0 Fall Risk Assessment Items Have you had 2 or more falls in the last 12 monthso 0 No Have you had any fall that resulted in injury in the last 12 monthso 0 No FALL RISK ASSESSMENT: History of falling - immediate or within 3 months 25 Yes Secondary diagnosis 15 Yes Ambulatory aid None/bed rest/wheelchair/nurse 0 No Crutches/cane/walker  15 Yes Furniture 0 No IV Access/Saline Lock 0 No Gait/Training Normal/bed rest/immobile 0 No Weak 10 Yes Impaired 20 Yes Mental Status Oriented to own ability 0 Yes Electronic Signature(s) Signed: 05/26/2015 5:52:45 PM By: Alejandro Mulling Entered By: Alejandro Mulling on 05/26/2015 10:17:27 [name], [name]. ([id number]) -------------------------------------------------------------------------------- Foot Assessment Details [name], [name] 05/26/2015 9:30 Patient Name: Date of Service: T. AM Medical Record Patient Account Number: [id number] Number: Treating RN: Phillis Haggis Date of Birth/Sex: 08/22/1928 (82 y.o. Female) Other Clinician: Primary Care Physician: Herminio Commons Treating Britto, Errol Referring Physician: Herminio Commons Physician/Extender: Tania Ade in Treatment: 0 Foot Assessment Items Site Locations + = Sensation present, - = Sensation absent, C = Callus, U = Ulcer R = Redness, W = Warmth, M = Maceration, PU = Pre-ulcerative lesion F = Fissure, S = Swelling, D = Dryness Assessment Right: Left: Other Deformity: No No Prior Foot Ulcer: No  No Prior Amputation: No No Charcot Joint: No No Ambulatory Status: Ambulatory With Help Assistance Device: Cane Gait: Steady Electronic Signature(s) Signed: 05/26/2015 5:52:45 PM By: Alejandro Mulling Entered By: Alejandro Mulling on 05/26/2015 10:21:58 Passarelli, Latifah T. ([id number]) Steger, Teliyah T. ([id number]) -------------------------------------------------------------------------------- Nutrition Risk Assessment Details Sardo, Aslee 05/26/2015 9:30 Patient Name: Date of Service: T. AM Medical Record Patient Account Number: [id number] Number: Treating RN: Phillis Haggis Date of Birth/Sex: 02-21-1928 (82 y.o. Female) Other Clinician: Primary Care Physician: Herminio Commons Treating Britto, Errol Referring Physician: Herminio Commons Physician/Extender: Tania Ade in Treatment: 0 Height (in): 61 Weight (lbs): 108 Body Mass Index (BMI): 20.4 Nutrition Risk Assessment Items NUTRITION RISK SCREEN: I have an illness or condition that made me change the kind and/or 0 No amount of food I eat I eat fewer than two meals per day 0 No I eat few fruits and vegetables, or milk products 0 No I have three or more drinks of beer, liquor or wine almost every day 0 No I have tooth or mouth problems that make it hard for me to eat 0 No I don't always have enough money to buy the food I need 0 No I eat alone most of the time 0 No I take three or more different prescribed or over-the-counter drugs a 1 Yes day Without wanting to, I have lost or gained 10 pounds in the last six 0 No months I am not always physically able to shop, cook and/or feed myself 0 No Nutrition Protocols Good Risk Protocol Moderate Risk Protocol Electronic Signature(s) Signed: 05/26/2015 5:52:45 PM By: Alejandro Mulling Entered By: Alejandro Mulling on 05/26/2015 10:18:52

## 2015-05-27 NOTE — Progress Notes (Signed)
AKIRAH, STORCK ([id number]) Visit Report for 05/26/2015 Allergy List Details Patient Name: [name], [name] Date of Service: 05/26/2015 9:30 AM Medical Record Number: [id number] Patient Account Number: [id number] Date of Birth/Sex: 23-Jun-1928 (82 yBoweno. Female) Treating RN: Ashok Cordia, Debi Primary Care Physician: Herminio Commons Other Clinician: Referring Physician: Herminio Commons Treating Physician/Extender: Rudene Re in Treatment: 0 Allergies Active Allergies aspirin Reaction: bleeding, vomiting Severity: Severe codeine Reaction: vomiting Severity: Severe naproxen Reaction: pain in joints Severity: Severe oxycodone-acetaminophen Severity: Severe Allergy Notes Electronic Signature(s) Signed: 05/26/2015 5:52:45 PM By: Alejandro Mulling Entered By: Alejandro Mulling on 05/26/2015 10:01:49 Goodwill, Aerilyn T. ([id number]) -------------------------------------------------------------------------------- Arrival Information Details Patient Name: [name], [name] Date of Service: 05/26/2015 9:30 AM Medical Record Number: [id number] Patient Account Number: [id number] Date of Birth/Sex: 26-Jun-1928 (82 yBoweno. Female) Treating RN: Ashok Cordia, Debi Primary Care Physician: Herminio Commons Other Clinician: Referring Physician: Herminio Commons Treating Physician/Extender: Rudene Re in Treatment: 0 Visit Information Patient Arrived: Cane Arrival Time: 09:55 Accompanied By: daughter Transfer Assistance: EasyPivot Patient Lift Patient Identification Verified: Yes Secondary Verification Process Yes Completed: Patient Requires Transmission- No Based Precautions: Patient Has Alerts: No Electronic Signature(s) Signed: 05/26/2015 5:52:45 PM By: Alejandro Mulling Entered By: Alejandro Mulling on 05/26/2015 09:56:07 [name], [name] ([id number]) -------------------------------------------------------------------------------- Clinic Level of Care Assessment Details Paredez, Rubina Date  of Service: 05/26/2015 9:30 AM Patient Name: T. Patient Account Number: [id number] Medical Record Clover Mealy, RN, [name], [id number] Treating RN: Number: Sterling Sink Date of Birth/Sex: [birth year] (82 yBoweno. Female) Other Clinician: Primary Care Physician: Herminio Commons Treating Evlyn Kanner Referring Physician: Herminio Commons Physician/Extender: Tania Ade in Treatment: 0 Clinic Level of Care Assessment Items TOOL 1 Quantity Score []  - Use when EandM and Procedure is performed on INITIAL visit 0 ASSESSMENTS - Nursing Assessment / Reassessment X - General Physical Exam (combine w/ comprehensive assessment (listed just 1 20 below) when performed on new pt. evals) X - Comprehensive Assessment (HX, ROS, Risk Assessments, Wounds Hx, etc.) 1 25 ASSESSMENTS - Wound and Skin Assessment / Reassessment []  - Dermatologic / Skin Assessment (not related to wound area) 0 ASSESSMENTS - Ostomy and/or Continence Assessment and Care []  - Incontinence Assessment and Management 0 []  - Ostomy Care Assessment and Management (repouching, etc.) 0 PROCESS - Coordination of Care X - Simple Patient / Family Education for ongoing care 1 15 []  - Complex (extensive) Patient / Family Education for ongoing care 0 X - Staff obtains Chiropractor, Records, Test Results / Process Orders 1 10 []  - Staff telephones HHA, Nursing Homes / Clarify orders / etc 0 []  - Routine Transfer to another Facility (non-emergent condition) 0 []  - Routine Hospital Admission (non-emergent condition) 0 X - New Admissions / Manufacturing engineer / Ordering NPWT, Apligraf, etc. 1 15 []  - Emergency Hospital Admission (emergent condition) 0 PROCESS - Special Needs []  - Pediatric / Minor Patient Management 0 [name], [name] ([id number]) []  - Isolation Patient Management 0 []  - Hearing / Language / Visual special needs 0 []  - Assessment of Community assistance (transportation, D/C planning, etc.) 0 []  - Additional assistance / Altered mentation 0 []  - Support  Surface(s) Assessment (bed, cushion, seat, etc.) 0 INTERVENTIONS - Miscellaneous []  - External ear exam 0 []  - Patient Transfer (multiple staff / Nurse, adult / Similar devices) 0 []  - Simple Staple / Suture removal (25 or less) 0 []  - Complex Staple / Suture removal (26 or more) 0 []  - Hypo/Hyperglycemic Management (do not check if billed separately) 0 X - Ankle / Brachial Index (ABI) -  do not check if billed separately 1 15 Has the patient been seen at the hospital within the last three years: Yes Total Score: 100 Level Of Care: New/Established - Level 3 Electronic Signature(s) Signed: 05/26/2015 11:38:45 AM By: Elpidio Eric [name], RN Entered By: Elpidio Eric on 05/26/2015 11:38:45 [name], [name] ([id number]) -------------------------------------------------------------------------------- Encounter Discharge Information Details Patient Name: [name], [name] Date of Service: 05/26/2015 9:30 AM Medical Record Number: [id number] Patient Account Number: [id number] Date of Birth/Sex: 1928-12-15 (82 yBoweno. Female) Treating RN: Ashok Cordia, Debi Primary Care Physician: Herminio Commons Other Clinician: Referring Physician: Herminio Commons Treating Physician/Extender: Rudene Re in Treatment: 0 Encounter Discharge Information Items Discharge Pain Level: 0 Discharge Condition: Stable Ambulatory Status: Cane Discharge Destination: Home Transportation: Private Auto Accompanied By: daughter Schedule Follow-up Appointment: Yes Medication Reconciliation completed No and provided to Patient/Care Allesha Aronoff: Provided on Clinical Summary of Care: 05/26/2015 Form Type Recipient Paper Patient RE Electronic Signature(s) Signed: 05/26/2015 11:33:06 AM By: Gwenlyn Perking Entered By: Gwenlyn Perking on 05/26/2015 11:33:05 [name], [name] ([id number]) -------------------------------------------------------------------------------- Lower Extremity Assessment Details Patient Name: [name], [name]  T. Date of Service: 05/26/2015 9:30 AM Medical Record Number: [id number] Patient Account Number: [id number] Date of Birth/Sex: 03/29/1928 (82 yBoweno. Female) Treating RN: Ashok Cordia, Debi Primary Care Physician: Herminio Commons Other Clinician: Referring Physician: Herminio Commons Treating Physician/Extender: Rudene Re in Treatment: 0 Edema Assessment Assessed: [Left: No] [Right: No] E[Left: dema] [Right: :] Calf Left: Right: Point of Measurement: 29 cm From Medial Instep 28[name]6 cm 28[name]5 cm Ankle Left: Right: Point of Measurement: 8 cm From Medial Instep 19 cm 17[name]8 cm Vascular Assessment Pulses: Posterior Tibial Dorsalis Pedis Palpable: [Left:Yes] [Right:Yes] Doppler: [Left:Monophasic] [Right:Monophasic] Extremity colors, hair growth, and conditions: Extremity Color: [Left:Hyperpigmented] [Right:Hyperpigmented] Hair Growth on Extremity: [Left:No] [Right:Yes] Temperature of Extremity: [Left:Warm] [Right:Warm] Capillary Refill: [Left:> 3 seconds] [Right:> 3 seconds] Blood Pressure: Brachial: [Left:139] [Right:139] Dorsalis Pedis: 110 [Left:Dorsalis Pedis: 120] Ankle: Posterior Tibial: 140 [Left:Posterior Tibial: 130 1[name]01] [Right:0[name]94] Toe Nail Assessment Left: Right: Thick: No No Discolored: No No Deformed: No No Improper Length and Hygiene: Yes Yes Electronic Signature(s) [name], [name] ([id number]) Signed: 05/26/2015 5:52:45 PM By: Alejandro Mulling Entered By: Alejandro Mulling on 05/26/2015 10:34:51 [name], [name] ([id number]) -------------------------------------------------------------------------------- Multi Wound Chart Details [name], [name] Date of Service: 05/26/2015 9:30 AM Patient Name: T. Patient Account Number: [id number] Medical Record Clover Mealy, RN, [name], [id number] Treating RN: Number: Inola Sink Date of Birth/Sex: 02-17-1928 (82 yBoweno. Female) Other Clinician: Primary Care Physician: Herminio Commons Treating Evlyn Kanner Referring Physician: Herminio Commons Physician/Extender: Tania Ade in Treatment: 0 Vital Signs Height(in): 61 Pulse(bpm): 73 Weight(lbs): 108 Blood Pressure 139/99 (mmHg): Body Mass Index(BMI): 20 Temperature(F): 97[name]5 Respiratory Rate 20 (breaths/min): Photos: [1:No Photos] [2:No Photos] [3:No Photos] Wound Location: [1:Right Toe Great - Dorsal] [2:Left Lower Leg - Medial] [3:Left Toe Great - Dorsal] Wounding Event: [1:Blister] [2:Trauma] [3:Blister] Primary Etiology: [1:2nd degree Burn] [2:Trauma, Other] [3:2nd degree Burn] Comorbid History: [1:Cataracts, Anemia, Hypertension, History of Burn, Gout, Osteoarthritis] [2:Cataracts, Anemia, Hypertension, History of Burn, Gout, Osteoarthritis] [3:Cataracts, Anemia, Hypertension, History of Burn, Gout, Osteoarthritis] Date Acquired: [1:05/12/2015] [2:04/06/2015] [3:05/05/2015] Weeks of Treatment: [1:0] [2:0] [3:0] Wound Status: [1:Open] [2:Open] [3:Open] Measurements L x W x D 0[name]1x0[name]1x0[name]1 [2:3x2[name]8x0[name]1] [3:0[name]1x0[name]1x0[name]1] (cm) Area (cm) : [1:0[name]008] [2:6[name]597] [3:0[name]008] Volume (cm) : [1:0[name]001] [2:0[name]66] [3:0[name]001] Classification: [1:Partial Thickness] [2:Partial Thickness] [3:Partial Thickness] Exudate Amount: [1:Large] [2:Large] [3:Large] Exudate Type: [1:Serous] [2:Serosanguineous] [3:Serous] Exudate Color: [1:amber] [2:red, brown] [3:amber] Wound Margin: [1:Flat and Intact] [2:Thickened] [3:Flat and Intact] Granulation Amount: [1:None Present (0%)] [2:None Present (  0%)] [3:None Present (0%)] Necrotic Amount: [1:Large (67-100%)] [2:Large (67-100%)] [3:Large (67-100%)] Necrotic Tissue: [1:Eschar] [2:Eschar] [3:Eschar] Exposed Structures: [1:Fascia: No Fat: No Tendon: No Muscle: No Joint: No Bone: No] [2:Fascia: No Fat: No Tendon: No Muscle: No Joint: No Bone: No] [3:Fascia: No Fat: No Tendon: No Muscle: No Joint: No Bone: No] Limited to Skin Limited to Skin Limited to Skin Breakdown Breakdown Breakdown Epithelialization: None None None Periwound Skin Texture: Edema: Yes  Edema: Yes Edema: Yes Periwound Skin Moist: Yes No Abnormalities Noted Moist: Yes Moisture: Periwound Skin Color: Erythema: Yes No Abnormalities Noted No Abnormalities Noted Erythema Location: Circumferential N/A N/A Temperature: No Abnormality No Abnormality No Abnormality Tenderness on Yes Yes Yes Palpation: Wound Preparation: Ulcer Cleansing: Ulcer Cleansing: Ulcer Cleansing: Rinsed/Irrigated with Rinsed/Irrigated with Rinsed/Irrigated with Saline Saline Saline Topical Anesthetic Topical Anesthetic Topical Anesthetic Applied: Other: lidocaine Applied: Other: lidocaine Applied: Other: lidocaine 4% 4% 4% Treatment Notes Electronic Signature(s) Signed: 05/26/2015 5:01:36 PM By: Elpidio EricAfful, [name] [name], RN Entered By: Elpidio EricAfful, [name] on 05/26/2015 11:04:32 Mallek, [name]. ([id number]) -------------------------------------------------------------------------------- Multi-Disciplinary Care Plan Details Martorana, Vadie Date of Service: 05/26/2015 9:30 AM Patient Name: T. Patient Account Number: [id number] Medical Record Clover Mealyfful, RN, [name], [id number] Treating RN: Number: Foley Sinkita Date of Birth/Sex: 1928/03/08 (82 yBoweno. Female) Other Clinician: Primary Care Physician: Herminio CommonsKIM, [name] Treating Evlyn KannerBritto, Errol Referring Physician: Herminio CommonsKIM, [name] Physician/Extender: Tania AdeWeeks in Treatment: 0 Active Inactive Abuse / Safety / Falls / Self Care Management Nursing Diagnoses: Impaired physical mobility Potential for falls Self care deficit: actual or potential Goals: Patient will remain injury free Date Initiated: 05/26/2015 Goal Status: Active Patient/caregiver will verbalize understanding of the importance to maintain current immunizations/vaccinations Date Initiated: 05/26/2015 Goal Status: Active Patient/caregiver will verbalize/demonstrate measure taken to improve self care Date Initiated: 05/26/2015 Goal Status: Active Patient/caregiver will verbalize/demonstrate measures taken to improve the patient's personal  safety Date Initiated: 05/26/2015 Goal Status: Active Patient/caregiver will verbalize/demonstrate measures taken to prevent injury and/or falls Date Initiated: 05/26/2015 Goal Status: Active Patient/caregiver will verbalize/demonstrate understanding of what to do in case of emergency Date Initiated: 05/26/2015 Goal Status: Active Interventions: Assess fall risk on admission and as needed Assess: immobility, friction, shearing, incontinence upon admission and as needed Assess impairment of mobility on admission and as needed per policy Assess self care needs on admission and as needed Enloe, Demisha T. ([id number]) Provide education on basic hygiene Provide education on fall prevention Provide education on personal and home safety Provide education on safe transfers Notes: Orientation to the Wound Care Program Nursing Diagnoses: Knowledge deficit related to the wound healing center program Goals: Patient/caregiver will verbalize understanding of the Wound Healing Center Program Date Initiated: 05/26/2015 Goal Status: Active Interventions: Provide education on orientation to the wound center Notes: Venous Leg Ulcer Nursing Diagnoses: Knowledge deficit related to disease process and management Potential for venous Insuffiency (use before diagnosis confirmed) Goals: Patient will maintain optimal edema control Date Initiated: 05/26/2015 Goal Status: Active Patient/caregiver will verbalize understanding of disease process and disease management Date Initiated: 05/26/2015 Goal Status: Active Verify adequate tissue perfusion prior to therapeutic compression application Date Initiated: 05/26/2015 Goal Status: Active Interventions: Assess peripheral edema status every visit. Provide education on venous insufficiency Notes: Wound/Skin Impairment Cottam, Anjolina T. ([id number]) Nursing Diagnoses: Impaired tissue integrity Knowledge deficit related to smoking impact on wound  healing Knowledge deficit related to ulceration/compromised skin integrity Goals: Patient/caregiver will verbalize understanding of skin care regimen Date Initiated: 05/26/2015 Goal Status: Active Ulcer/skin breakdown will have a volume reduction of 30% by week 4 Date  Initiated: 05/26/2015 Goal Status: Active Ulcer/skin breakdown will have a volume reduction of 50% by week 8 Date Initiated: 05/26/2015 Goal Status: Active Ulcer/skin breakdown will have a volume reduction of 80% by week 12 Date Initiated: 05/26/2015 Goal Status: Active Ulcer/skin breakdown will heal within 14 weeks Date Initiated: 05/26/2015 Goal Status: Active Interventions: Assess patient/caregiver ability to obtain necessary supplies Assess patient/caregiver ability to perform ulcer/skin care regimen upon admission and as needed Assess ulceration(s) every visit Provide education on ulcer and skin care Treatment Activities: Referred to DME Dawanna Grauberger for dressing supplies : 05/26/2015 Skin care regimen initiated : 05/26/2015 Topical wound management initiated : 05/26/2015 Notes: Electronic Signature(s) Signed: 05/26/2015 5:01:36 PM By: Elpidio Eric [name], RN Entered By: Elpidio Eric on 05/26/2015 11:03:57 Muramoto, Letia T. ([id number]) -------------------------------------------------------------------------------- Pain Assessment Details Patient Name: Tierney, Ameri T. Date of Service: 05/26/2015 9:30 AM Medical Record Number: [id number] Patient Account Number: [id number] Date of Birth/Sex: Apr 04, 1928 (82 yBoweno. Female) Treating RN: Ashok Cordia, Debi Primary Care Physician: Herminio Commons Other Clinician: Referring Physician: Herminio Commons Treating Physician/Extender: Rudene Re in Treatment: 0 Active Problems Location of Pain Severity and Description of Pain Patient Has Paino No Site Locations Pain Management and Medication Current Pain Management: Electronic Signature(s) Signed: 05/26/2015 5:52:45 PM By: Alejandro Mulling Entered By: Alejandro Mulling on 05/26/2015 09:56:14 Joo, Vivan T. ([id number]) -------------------------------------------------------------------------------- Patient/Caregiver Education Details Patient Name: Kazmierski, Lamonda T. Date of Service: 05/26/2015 9:30 AM Medical Record Number: [id number] Patient Account Number: [id number] Date of Birth/Gender: Apr 24, 1928 (82 yBoweno. Female) Treating RN: Ashok Cordia, Debi Primary Care Physician: Herminio Commons Other Clinician: Referring Physician: Herminio Commons Treating Physician/Extender: Rudene Re in Treatment: 0 Education Assessment Education Provided To: Patient Education Topics Provided Wound/Skin Impairment: Handouts: Other: change dressing as ordered Methods: Demonstration, Explain/Verbal Responses: State content correctly Electronic Signature(s) Signed: 05/26/2015 5:52:45 PM By: Alejandro Mulling Entered By: Alejandro Mulling on 05/26/2015 11:30:47 Pegues, Finley T. ([id number]) -------------------------------------------------------------------------------- Wound Assessment Details Patient Name: Figgs, Vernida T. Date of Service: 05/26/2015 9:30 AM Medical Record Number: [id number] Patient Account Number: [id number] Date of Birth/Sex: 1928-03-14 (82 yBoweno. Female) Treating RN: Ashok Cordia, Debi Primary Care Physician: Herminio Commons Other Clinician: Referring Physician: Herminio Commons Treating Physician/Extender: Rudene Re in Treatment: 0 Wound Status Wound Number: 1 Primary 2nd degree Burn Etiology: Wound Location: Right Toe Great - Dorsal Wound Open Wounding Event: Blister Status: Date Acquired: 05/12/2015 Comorbid Cataracts, Anemia, Hypertension, Weeks Of Treatment: 0 History: History of Burn, Gout, Osteoarthritis Clustered Wound: No Photos Photo Uploaded By: Alejandro Mulling on 05/26/2015 11:49:26 Wound Measurements Length: (cm) 0[name]1 % Reduction in Width: (cm) 0[name]1 % Reduction in Depth: (cm) 0[name]1  Epithelializati Area: (cm) 0[name]008 Tunneling: Volume: (cm) 0[name]001 Undermining: Area: Volume: on: None No No Wound Description Classification: Partial Thickness Foul Odor Afte Wound Margin: Flat and Intact Exudate Amount: Large Exudate Type: Serous Exudate Color: amber r Cleansing: No Wound Bed Granulation Amount: None Present (0%) Exposed Structure Necrotic Amount: Large (67-100%) Fascia Exposed: No Necrotic Quality: Eschar Fat Layer Exposed: No Tendon Exposed: No Rendell, Vasilia T. ([id number]) Muscle Exposed: No Joint Exposed: No Bone Exposed: No Limited to Skin Breakdown Periwound Skin Texture Texture Color No Abnormalities Noted: No No Abnormalities Noted: No Localized Edema: Yes Erythema: Yes Erythema Location: Circumferential Moisture No Abnormalities Noted: No Temperature / Pain Moist: Yes Temperature: No Abnormality Tenderness on Palpation: Yes Wound Preparation Ulcer Cleansing: Rinsed/Irrigated with Saline Topical Anesthetic Applied: Other: lidocaine 4%, Treatment Notes Wound #1 (Right, Dorsal Toe Great) 1. Cleansed with: Clean wound with Dakins/Anasept 2. Anesthetic  Topical Lidocaine 4% cream to wound bed prior to debridement 4. Dressing Applied: Silvadene Cream 5. Secondary Dressing Applied Dry Gauze Kerlix/Conform 7. Secured with Secretary/administrator) Signed: 05/26/2015 5:52:45 PM By: Alejandro Mulling Entered By: Alejandro Mulling on 05/26/2015 10:37:13 Conely, Marrisa T. ([id number]) -------------------------------------------------------------------------------- Wound Assessment Details Patient Name: Albarracin, Flara T. Date of Service: 05/26/2015 9:30 AM Medical Record Number: [id number] Patient Account Number: [id number] Date of Birth/Sex: 05/14/1928 (82 yBoweno. Female) Treating RN: Ashok Cordia, Debi Primary Care Physician: Herminio Commons Other Clinician: Referring Physician: Herminio Commons Treating Physician/Extender: Rudene Re  in Treatment: 0 Wound Status Wound Number: 2 Primary Trauma, Other Etiology: Wound Location: Left Lower Leg - Medial Wound Open Wounding Event: Trauma Status: Date Acquired: 04/06/2015 Comorbid Cataracts, Anemia, Hypertension, Weeks Of Treatment: 0 History: History of Burn, Gout, Osteoarthritis Clustered Wound: No Photos Photo Uploaded By: Alejandro Mulling on 05/26/2015 11:49:26 Wound Measurements Length: (cm) 3 % Reduction in Width: (cm) 2[name]8 % Reduction in Depth: (cm) 0[name]1 Epithelializati Area: (cm) 6[name]597 Tunneling: Volume: (cm) 0[name]66 Undermining: Area: Volume: on: None No No Wound Description Classification: Partial Thickness Foul Odor Afte Wound Margin: Thickened Exudate Amount: Large Exudate Type: Serosanguineous Exudate Color: red, brown r Cleansing: No Wound Bed Granulation Amount: None Present (0%) Exposed Structure Necrotic Amount: Large (67-100%) Fascia Exposed: No Necrotic Quality: Eschar Fat Layer Exposed: No Tendon Exposed: No Boyte, Kamry T. ([id number]) Muscle Exposed: No Joint Exposed: No Bone Exposed: No Limited to Skin Breakdown Periwound Skin Texture Texture Color No Abnormalities Noted: No No Abnormalities Noted: No Localized Edema: Yes Temperature / Pain Moisture Temperature: No Abnormality No Abnormalities Noted: No Tenderness on Palpation: Yes Wound Preparation Ulcer Cleansing: Rinsed/Irrigated with Saline Topical Anesthetic Applied: Other: lidocaine 4%, Treatment Notes Wound #2 (Left, Medial Lower Leg) 1. Cleansed with: Clean wound with Normal Saline 2. Anesthetic Topical Lidocaine 4% cream to wound bed prior to debridement 4. Dressing Applied: Aquacel Ag 5. Secondary Dressing Applied Dry Gauze 7. Secured with Tape Notes conform, netting Electronic Signature(s) Signed: 05/26/2015 5:52:45 PM By: Alejandro Mulling Entered By: Alejandro Mulling on 05/26/2015 10:39:02 Thelander, Laketa T.  ([id number]) -------------------------------------------------------------------------------- Wound Assessment Details Kobs, Jillien Date of Service: 05/26/2015 9:30 AM Patient Name: T. Patient Account Number: [id number] Medical Record Clover Mealy, RN, [name], [id number] Treating RN: Number: De Graff Sink Date of Birth/Sex: 08-Mar-1928 (82 yBoweno. Female) Other Clinician: Primary Care Physician: Herminio Commons Treating Britto, Errol Referring Physician: Herminio Commons Physician/Extender: Tania Ade in Treatment: 0 Wound Status Wound Number: 3 Primary 2nd degree Burn Etiology: Wound Location: Left Toe Great - Dorsal Wound Open Wounding Event: Blister Status: Date Acquired: 05/05/2015 Comorbid Cataracts, Anemia, Hypertension, Weeks Of Treatment: 0 History: History of Burn, Gout, Osteoarthritis Clustered Wound: No Photos Photo Uploaded By: Alejandro Mulling on 05/26/2015 11:49:39 Wound Measurements Length: (cm) 0[name]1 Width: (cm) 0[name]1 Depth: (cm) 0[name]1 Area: (cm) 0 Volume: (cm) 0 % Reduction in Area: % Reduction in Volume: Epithelialization: None Tunneling: No Undermining: No Wound Description Classification: Partial Thickness Wound Margin: Distinct, outline attached Exudate Amount: Small Exudate Type: Serosanguineous Exudate Color: red, brown Foul Odor After Cleansing: No Wound Bed Granulation Amount: None Present (0%) Exposed Structure Necrotic Amount: Small (1-33%) Fascia Exposed: No Necrotic Quality: Adherent Slough Fat Layer Exposed: No Victorino, Samiha T. ([id number]) Tendon Exposed: No Muscle Exposed: No Joint Exposed: No Bone Exposed: No Limited to Skin Breakdown Periwound Skin Texture Texture Color No Abnormalities Noted: No No Abnormalities Noted: No Callus: No Atrophie Blanche: No Crepitus: No Cyanosis: No Excoriation: No Ecchymosis: No Fluctuance: No Erythema: No Friable: No  Hemosiderin Staining: No Induration: No Mottled: No Localized Edema: No Pallor: No Rash: No Rubor:  No Scarring: No Temperature / Pain Moisture Temperature: No Abnormality No Abnormalities Noted: No Tenderness on Palpation: Yes Dry / Scaly: No Maceration: Yes Moist: Yes Wound Preparation Ulcer Cleansing: Rinsed/Irrigated with Saline Topical Anesthetic Applied: Other: lidocaine 4%, Treatment Notes Wound #3 (Left, Dorsal Toe Great) 1. Cleansed with: Clean wound with Dakins/Anasept 2. Anesthetic Topical Lidocaine 4% cream to wound bed prior to debridement 4. Dressing Applied: Silvadene Cream 5. Secondary Dressing Applied Dry Gauze Kerlix/Conform 7. Secured with Secretary/administrator) Signed: 05/26/2015 5:01:36 PM By: Elpidio Eric [name], RN Entered By: Elpidio Eric on 05/26/2015 11:12:15 Liotta, Tuesday T. ([id number]) Burks, Myrka T. ([id number]) -------------------------------------------------------------------------------- Vitals Details Patient Name: Knezevic, Levita T. Date of Service: 05/26/2015 9:30 AM Medical Record Number: [id number] Patient Account Number: [id number] Date of Birth/Sex: February 11, 1928 (82 yBoweno. Female) Treating RN: Ashok Cordia, Debi Primary Care Physician: Herminio Commons Other Clinician: Referring Physician: Herminio Commons Treating Physician/Extender: Rudene Re in Treatment: 0 Vital Signs Time Taken: 09:56 Temperature (F): 97[name]5 Height (in): 61 Pulse (bpm): 73 Source: Stated Respiratory Rate (breaths/min): 20 Weight (lbs): 108 Blood Pressure (mmHg): 139/99 Source: Stated Reference Range: 80 - 120 mg / dl Body Mass Index (BMI): 20[name]4 Electronic Signature(s) Signed: 05/26/2015 5:52:45 PM By: Alejandro Mulling Entered By: Alejandro Mulling on 05/26/2015 09:58:42

## 2015-06-02 ENCOUNTER — Ambulatory Visit: Payer: Medicare Other | Admitting: Surgery

## 2015-06-03 ENCOUNTER — Encounter: Payer: Medicare Other | Admitting: Surgery

## 2015-06-03 DIAGNOSIS — S81802A Unspecified open wound, left lower leg, initial encounter: Secondary | ICD-10-CM | POA: Diagnosis not present

## 2015-06-03 DIAGNOSIS — L97222 Non-pressure chronic ulcer of left calf with fat layer exposed: Secondary | ICD-10-CM | POA: Diagnosis not present

## 2015-06-03 DIAGNOSIS — L97511 Non-pressure chronic ulcer of other part of right foot limited to breakdown of skin: Secondary | ICD-10-CM | POA: Diagnosis not present

## 2015-06-03 DIAGNOSIS — I10 Essential (primary) hypertension: Secondary | ICD-10-CM | POA: Diagnosis not present

## 2015-06-03 DIAGNOSIS — L551 Sunburn of second degree: Secondary | ICD-10-CM | POA: Diagnosis not present

## 2015-06-03 DIAGNOSIS — L97521 Non-pressure chronic ulcer of other part of left foot limited to breakdown of skin: Secondary | ICD-10-CM | POA: Diagnosis not present

## 2015-06-03 DIAGNOSIS — M810 Age-related osteoporosis without current pathological fracture: Secondary | ICD-10-CM | POA: Diagnosis not present

## 2015-06-04 NOTE — Progress Notes (Signed)
[name] ([id number]) Visit Report for 06/03/2015 Chief Complaint Document Details Jolly, [id number] [facility] 06/03/2015 12:45 Patient Name: Date of Service: T. PM Medical Record Patient Account Number: [id number] Number: Treating RN: Phillis Haggis Date of Birth/Sex: 26-Jan-1928 (82 y.o. Female) Other Clinician: Primary Care Treating [name], [name] Mckell Riecke Physician: Physician/Extender: Referring Physician: Herminio Commons Weeks in Treatment: 1 Information Obtained from: Patient Chief Complaint Patient seen for complaints of Non-Healing Wound to the left lower extremity for about 2 months and to the toes on both feet for about 2 weeks Electronic Signature(s) Signed: 06/03/2015 1:37:49 PM By: Evlyn Kanner MD, FACS Previous Signature: 06/03/2015 1:37:28 PM Version By: Evlyn Kanner MD, FACS Entered By: Evlyn Kanner on 06/03/2015 13:37:49 [name], [name] ([id number]) -------------------------------------------------------------------------------- Debridement Details [name], [name] 06/03/2015 12:45 Patient Name: Date of Service: T. PM Medical Record Patient Account Number: [id number] Number: Treating RN: Phillis Haggis Date of Birth/Sex: 19-Mar-1928 (82 y.o. Female) Other Clinician: Primary Care Treating [name], [name] [name] Physician: Physician/Extender: Referring Physician: Herminio Commons Weeks in Treatment: 1 Debridement Performed for Wound #2 Left,Medial Lower Leg Assessment: Performed By: Physician Evlyn Kanner, MD Debridement: Debridement Pre-procedure Yes Verification/Time Out Taken: Start Time: 13:26 Pain Control: Lidocaine 4% Topical Solution Level: Skin/Subcutaneous Tissue Total Area Debrided (L x 2 (cm) x 2.5 (cm) = 5 (cm) W): Tissue and other Viable, Non-Viable, Exudate, Fibrin/Slough, Subcutaneous material debrided: Instrument: Other : saline wet guaze Bleeding: Minimum Hemostasis Achieved: Pressure End Time:  13:29 Procedural Pain: 0 Post Procedural Pain: 0 Response to Treatment: Procedure was tolerated well Post Debridement Measurements of Total Wound Length: (cm) 2 Width: (cm) 2.5 Depth: (cm) 0.1 Volume: (cm) 0.393 Post Procedure Diagnosis Same as Pre-procedure Electronic Signature(s) Signed: 06/03/2015 1:37:21 PM By: Evlyn Kanner MD, FACS Signed: 06/03/2015 5:12:54 PM By: Alejandro Mulling Entered By: Evlyn Kanner on 06/03/2015 13:37:21 [name], [name] ([id number]) [name], [name] ([id number]) -------------------------------------------------------------------------------- HPI Details [name] 06/03/2015 12:45 Patient Name: Date of Service: T. PM Medical Record Patient Account Number: [id number] Number: Treating RN: Phillis Haggis Date of Birth/Sex: 12-20-1928 (82 y.o. Female) Other Clinician: Primary Care Treating [name], [name] [name] Physician: Physician/Extender: Referring Physician: Herminio Commons Weeks in Treatment: 1 History of Present Illness Location: left lower extremity and both right and left big toes Quality: Patient reports experiencing a dull pain to affected area(s). Severity: Patient states wound are getting worse. Duration: Patient has had the wound for > 2 months prior to seeking treatment at the wound center Timing: Pain in wound is Intermittent (comes and goes Context: The wound occurred when the patient had a lacerated wound to her left lateral calf about 2 months ago with a blunt injury and then sunburnt her feet about 2 weeks ago Modifying Factors: Other treatment(s) tried include:antibiotics and local care Associated Signs and Symptoms: Patient reports having increase swelling. HPI Description: 82 year old patient with a past medical history of essential hypertension, osteoporosis, vitamin D deficiency, pernicious anemia. she was recently seen by her PCP Dr. Herold Harms for a wound on the left leg with cellulitis of the leg  and foot. she is also status post appendectomy, left hip arthroplasty.She has been treated with several courses of antibiotics and wound care and was improving but then went and had a sunburn to both feet and got infected and had another course of Bactrim and Keflex. she had a blister on the right big toe that was red and inflamed. she is also had some recent x-rays had an x-ray of the left ankle  which showed no acute osseous findings are also had x-ray of the left tibia and fibula which was a normal tibia and fibula. on 05/03/2015 she had a venous duplex study of her left lower extremity which showed no evidence of deep or superficial venous thrombosis or incompetence. She is a former smoker and quit in 2008 June Electronic Signature(s) Signed: 06/03/2015 1:37:59 PM By: Evlyn Kanner MD, FACS Entered By: Evlyn Kanner on 06/03/2015 13:37:59 [name], [name] ([id number]) -------------------------------------------------------------------------------- Physical Exam Details [name], [name] 06/03/2015 12:45 Patient Name: Date of Service: T. PM Medical Record Patient Account Number: [id number] Number: Treating RN: Phillis Haggis Date of Birth/Sex: [birth year] (82 y.o. Female) Other Clinician: Primary Care Treating [name], [name] Josian Lanese Physician: Physician/Extender: Referring Physician: Herminio Commons Weeks in Treatment: 1 Constitutional . Pulse regular. Respirations normal and unlabored. Afebrile. . Eyes Nonicteric. Reactive to light. Ears, Nose, Mouth, and Throat Lips, teeth, and gums WNL.Marland Kitchen Moist mucosa without lesions. Neck supple and nontender. No palpable supraclavicular or cervical adenopathy. Normal sized without goiter. Respiratory WNL. No retractions.. Cardiovascular Pedal Pulses WNL. No clubbing, cyanosis or edema. Lymphatic No adneopathy. No adenopathy. No adenopathy. Musculoskeletal Adexa without tenderness or enlargement.. Digits and nails w/o clubbing,  cyanosis, infection, petechiae, ischemia, or inflammatory conditions.. Integumentary (Hair, Skin) No suspicious lesions. No crepitus or fluctuance. No peri-wound warmth or erythema. No masses.Marland Kitchen Psychiatric Judgement and insight Intact.. No evidence of depression, anxiety, or agitation.. Notes on the left lower extremity she continues to have a lot of subcutaneous debris and I have sharply debrided this with a moist saline gauze and a large amount of slough was removed revealing clean granulation tissue under this. The granulation tissue is healthy and bled minimally on touch Electronic Signature(s) Signed: 06/03/2015 1:38:54 PM By: Evlyn Kanner MD, FACS Entered By: Evlyn Kanner on 06/03/2015 13:38:53 [name], [name] ([id number]) -------------------------------------------------------------------------------- Physician Orders Details [name], [name] 06/03/2015 12:45 Patient Name: Date of Service: T. PM Medical Record Patient Account Number: [id number] Number: Treating RN: Phillis Haggis Date of Birth/Sex: 1928-11-26 (82 y.o. Female) Other Clinician: Primary Care Treating [name], [name] Jearlene Bridwell Physician: Physician/Extender: Referring Physician: Herminio Commons Weeks in Treatment: 1 Verbal / Phone Orders: Yes Clinician: Pinkerton, Debi Read Back and Verified: Yes Diagnosis Coding Wound Cleansing Wound #2 Left,Medial Lower Leg o Cleanse wound with mild soap and water o May Shower, gently pat wound dry prior to applying new dressing. Anesthetic Wound #2 Left,Medial Lower Leg o Topical Lidocaine 4% cream applied to wound bed prior to debridement - for office use only Primary Wound Dressing Wound #2 Left,Medial Lower Leg o Aquacel Ag Secondary Dressing Wound #2 Left,Medial Lower Leg o Conform/Kerlix - netting Dressing Change Frequency Wound #2 Left,Medial Lower Leg o Change dressing every other day. Follow-up Appointments Wound #2 Left,Medial Lower  Leg o Return Appointment in 1 week. Edema Control Wound #2 Left,Medial Lower Leg o Elevate legs to the level of the heart and pump ankles as often as possible Additional Orders / Instructions Wound #2 Left,Medial Lower Leg o Increase protein intake. Spieker, Kendria [name] ([id number]) o Activity as tolerated Electronic Signature(s) Signed: 06/03/2015 3:59:17 PM By: Evlyn Kanner MD, FACS Signed: 06/03/2015 5:12:54 PM By: Alejandro Mulling Entered By: Alejandro Mulling on 06/03/2015 13:32:50 Rettinger, Myleah T. ([id number]) -------------------------------------------------------------------------------- Problem List Details [name], [name] 06/03/2015 12:45 Patient Name: Date of Service: T. PM Medical Record Patient Account Number: [id number] Number: Treating RN: Phillis Haggis Date of Birth/Sex: [birth year] (82 y.o. Female) Other Clinician: Primary Care Treating [name], [name] Tanice Petre  Physician: Physician/Extender: Referring Physician: Herminio Commons Weeks in Treatment: 1 Active Problems ICD-10 Encounter Code Description Active Date Diagnosis L97.222 Non-pressure chronic ulcer of left calf with fat layer 05/26/2015 Yes exposed L55.1 Sunburn of second degree 05/26/2015 Yes L97.511 Non-pressure chronic ulcer of other part of right foot 05/26/2015 Yes limited to breakdown of skin L97.521 Non-pressure chronic ulcer of other part of left foot limited 05/26/2015 Yes to breakdown of skin Inactive Problems Resolved Problems Electronic Signature(s) Signed: 06/03/2015 1:36:53 PM By: Evlyn Kanner MD, FACS Entered By: Evlyn Kanner on 06/03/2015 13:36:53 [name], [name] ([id number]) -------------------------------------------------------------------------------- Progress Note Details [name], [name] 06/03/2015 12:45 Patient Name: Date of Service: T. PM Medical Record Patient Account Number: [id number] Number: Treating RN: Phillis Haggis Date of  Birth/Sex: 1928/08/23 (82 y.o. Female) Other Clinician: Primary Care Treating [name], [name] [place] Physician: Physician/Extender: Referring Physician: Herminio Commons Weeks in Treatment: 1 Subjective Chief Complaint Information obtained from Patient Patient seen for complaints of Non-Healing Wound to the left lower extremity for about 2 months and to the toes on both feet for about 2 weeks History of Present Illness (HPI) The following HPI elements were documented for the patient's wound: Location: left lower extremity and both right and left big toes Quality: Patient reports experiencing a dull pain to affected area(s). Severity: Patient states wound are getting worse. Duration: Patient has had the wound for > 2 months prior to seeking treatment at the wound center Timing: Pain in wound is Intermittent (comes and goes Context: The wound occurred when the patient had a lacerated wound to her left lateral calf about 2 months ago with a blunt injury and then sunburnt her feet about 2 weeks ago Modifying Factors: Other treatment(s) tried include:antibiotics and local care Associated Signs and Symptoms: Patient reports having increase swelling. 82 year old patient with a past medical history of essential hypertension, osteoporosis, vitamin D deficiency, pernicious anemia. she was recently seen by her PCP Dr. Herold Harms for a wound on the left leg with cellulitis of the leg and foot. she is also status post appendectomy, left hip arthroplasty.She has been treated with several courses of antibiotics and wound care and was improving but then went and had a sunburn to both feet and got infected and had another course of Bactrim and Keflex. she had a blister on the right big toe that was red and inflamed. she is also had some recent x-rays had an x-ray of the left ankle which showed no acute osseous findings are also had x-ray of the left tibia and fibula which was a normal tibia and fibula. on  05/03/2015 she had a venous duplex study of her left lower extremity which showed no evidence of deep or superficial venous thrombosis or incompetence. She is a former smoker and quit in 2008 June [name], [name] ([id number]) Objective Constitutional Pulse regular. Respirations normal and unlabored. Afebrile. Vitals Time Taken: 12:55 PM, Height: 61 in, Weight: 108 lbs, BMI: 20.4, Temperature: 97.6 F, Pulse: 67 bpm, Respiratory Rate: 20 breaths/min, Blood Pressure: 124/39 mmHg. Eyes Nonicteric. Reactive to light. Ears, Nose, Mouth, and Throat Lips, teeth, and gums WNL.Marland Kitchen Moist mucosa without lesions. Neck supple and nontender. No palpable supraclavicular or cervical adenopathy. Normal sized without goiter. Respiratory WNL. No retractions.. Cardiovascular Pedal Pulses WNL. No clubbing, cyanosis or edema. Lymphatic No adneopathy. No adenopathy. No adenopathy. Musculoskeletal Adexa without tenderness or enlargement.. Digits and nails w/o clubbing, cyanosis, infection, petechiae, ischemia, or inflammatory conditions.Marland Kitchen Psychiatric Judgement and insight Intact.. No evidence of depression, anxiety,  or agitation.. General Notes: on the left lower extremity she continues to have a lot of subcutaneous debris and I have sharply debrided this with a moist saline gauze and a large amount of slough was removed revealing clean granulation tissue under this. The granulation tissue is healthy and bled minimally on touch Integumentary (Hair, Skin) No suspicious lesions. No crepitus or fluctuance. No peri-wound warmth or erythema. No masses.. Wound #1 status is Healed - Epithelialized. Original cause of wound was Blister. The wound is located on the Right,Dorsal [place]. The wound measures 0cm length x 0cm width x 0cm depth; 0cm^2 area and 0cm^3 volume. The wound is limited to skin breakdown. There is no tunneling or undermining noted. There is a large amount of serous drainage noted. The wound  margin is flat and intact. There is no granulation within the wound bed. There is a large (67-100%) amount of necrotic tissue within the wound bed including Eschar. The periwound skin appearance exhibited: Localized Edema, Moist, Erythema. The surrounding wound skin color is noted with erythema which is circumferential. Periwound temperature was noted as No Weill, [name] ([id number]) Abnormality. The periwound has tenderness on palpation. Wound #2 status is Open. Original cause of wound was Trauma. The wound is located on the Left,Medial Lower Leg. The wound measures 2cm length x 2.5cm width x 0.1cm depth; 3.927cm^2 area and 0.393cm^3 volume. The wound is limited to skin breakdown. There is no tunneling or undermining noted. There is a large amount of purulent drainage noted. The wound margin is thickened. There is no granulation within the wound bed. There is a large (67-100%) amount of necrotic tissue within the wound bed including Eschar and Adherent Slough. The periwound skin appearance exhibited: Localized Edema, Moist. Periwound temperature was noted as No Abnormality. The periwound has tenderness on palpation. Wound #3 status is Healed - Epithelialized. Original cause of wound was Blister. The wound is located on the [employer] [employer],Dorsal Toe Great. The wound measures 0cm length x 0cm width x 0cm depth; 0cm^2 area and 0cm^3 volume. The wound is limited to skin breakdown. There is no tunneling or undermining noted. There is a small amount of serosanguineous drainage noted. The wound margin is distinct with the outline attached to the wound base. There is no granulation within the wound bed. There is a small (1-33%) amount of necrotic tissue within the wound bed including Adherent Slough. The periwound skin appearance exhibited: Maceration, Moist. The periwound skin appearance did not exhibit: Callus, Crepitus, Excoriation, Fluctuance, Friable, Induration, Localized Edema, Rash, Scarring,  Dry/Scaly, Atrophie Blanche, Cyanosis, Ecchymosis, Hemosiderin Staining, Mottled, Pallor, Rubor, Erythema. Periwound temperature was noted as No Abnormality. The periwound has tenderness on palpation. Assessment Active Problems ICD-10 L97.222 - Non-pressure chronic ulcer of left calf with fat layer exposed L55.1 - Sunburn of second degree L97.511 - Non-pressure chronic ulcer of other part of right foot limited to breakdown of skin L97.521 - Non-pressure chronic ulcer of other part of left foot limited to breakdown of skin Procedures Wound #2 Wound #2 is a Trauma, Other located on the Left,Medial Lower Leg . There was a Skin/Subcutaneous Tissue Debridement ([id number]) debridement with total area of 5 sq cm performed by Evlyn KannerBritto, Jonathon Castelo, MD. with the following instrument(s): saline wet guaze to remove Viable and Non-Viable tissue/material including Exudate, Fibrin/Slough, and Subcutaneous after achieving pain control using Lidocaine 4% Topical Solution. A time out was conducted prior to the start of the procedure. A Minimum amount of bleeding was controlled with Pressure. The procedure was tolerated  well with a pain level of 0 throughout and a pain level of 0 following the procedure. Post Debridement Measurements: 2cm length x 2.5cm width x 0.1cm depth; 0.393cm^3 volume. [name], [name] ([id number]) Post procedure Diagnosis Wound #2: Same as Pre-Procedure Plan Wound Cleansing: Wound #2 Left,Medial Lower Leg: Cleanse wound with mild soap and water May Shower, gently pat wound dry prior to applying new dressing. Anesthetic: Wound #2 Left,Medial Lower Leg: Topical Lidocaine 4% cream applied to wound bed prior to debridement - for office use only Primary Wound Dressing: Wound #2 Left,Medial Lower Leg: Aquacel Ag Secondary Dressing: Wound #2 Left,Medial Lower Leg: Conform/Kerlix - netting Dressing Change Frequency: Wound #2 Left,Medial Lower Leg: Change dressing every other  day. Follow-up Appointments: Wound #2 Left,Medial Lower Leg: Return Appointment in 1 week. Edema Control: Wound #2 Left,Medial Lower Leg: Elevate legs to the level of the heart and pump ankles as often as possible Additional Orders / Instructions: Wound #2 Left,Medial Lower Leg: Increase protein intake. Activity as tolerated I have recommended: 1. a bland skin ointment to the toes and a light dressing over this and offloading it as much as possible 2. Aquacel Ag to the left lower extremity and a light Kerlix bandage to keep in place as the skin is very fragile due to being on steroids long-term 3. Elevation and exercise. 4. regular visits to the wound center. [name], [name] ([id number]) she and her daughter have had all QUESTIONS answered Electronic Signature(s) Signed: 06/03/2015 1:39:41 PM By: Evlyn Kanner MD, FACS Entered By: Evlyn Kanner on 06/03/2015 13:39:41 [name], [name] ([id number]) -------------------------------------------------------------------------------- SuperBill Details Patient Name: [name], [name] T. Date of Service: 06/03/2015 Medical Record Number: [id number] Patient Account Number: [id number] Date of Birth/Sex: January 08, 1929 (82 y.o. Female) Treating RN: Ashok Cordia, Debi Primary Care Physician: Herminio Commons Other Clinician: Referring Physician: Herminio Commons Treating Physician/Extender: Rudene Re in Treatment: 1 Diagnosis Coding ICD-10 Codes Code Description [id number] Non-pressure chronic ulcer of left calf with fat layer exposed L55.1 Sunburn of second degree L97.511 Non-pressure chronic ulcer of other part of right foot limited to breakdown of skin L97.521 Non-pressure chronic ulcer of other part of left foot limited to breakdown of skin Facility Procedures CPT4: Description Modifier Quantity Code [id number] 11042 - DEB SUBQ TISSUE 20 SQ CM/< 1 ICD-10 Description Diagnosis L97.222 Non-pressure chronic ulcer of left calf with fat layer exposed  L97.511 Non-pressure chronic ulcer of other part of right foot  limited to breakdown of skin L97.521 Non-pressure chronic ulcer of other part of left foot limited to breakdown of skin Physician Procedures CPT4: Description Modifier Quantity Code [id number] 11042 - WC PHYS SUBQ TISS 20 SQ CM 1 ICD-10 Description Diagnosis L97.222 Non-pressure chronic ulcer of left calf with fat layer exposed L97.511 Non-pressure chronic ulcer of other part of right foot  limited to breakdown of skin L97.521 Non-pressure chronic ulcer of other part of left foot limited to breakdown of skin Electronic Signature(s) Signed: 06/03/2015 1:39:57 PM By: Evlyn Kanner MD, FACS Greiner, Mertie T. ([id number]) Entered By: Evlyn Kanner on 06/03/2015 13:39:57

## 2015-06-04 NOTE — Progress Notes (Signed)
[name], [name] ([id number]) Visit Report for 06/03/2015 Arrival Information Details [name], [name] 06/03/2015 12:45 Patient Name: Date of Service: T. PM Medical Record Patient Account Number: [id number] Number: Treating RN: Phillis Haggis Date of Birth/Sex: 04-03-1928 (82 y.o. Female) Other Clinician: Primary Care Physician: Herminio Commons Treating Britto, Errol Referring Physician: Herminio Commons Physician/Extender: Tania Ade in Treatment: 1 Visit Information History Since Last Visit All ordered tests and consults were completed: No Patient Arrived: Cane Added or deleted any medications: No Arrival Time: 12:54 Any new allergies or adverse reactions: No Accompanied By: daughter Had a fall or experienced change in No Transfer Assistance: None activities of daily living that may affect Patient Identification Verified: Yes risk of falls: Secondary Verification Process Yes Signs or symptoms of abuse/neglect since last No Completed: visito Patient Requires Transmission-Based No Hospitalized since last visit: No Precautions: Pain Present Now: No Patient Has Alerts: No Electronic Signature(s) Signed: 06/03/2015 5:12:54 PM By: Alejandro Mulling Entered By: Alejandro Mulling on 06/03/2015 12:55:01 Speranza, Celestina T. ([id number]) -------------------------------------------------------------------------------- Encounter Discharge Information Details [name], [name] 06/03/2015 12:45 Patient Name: Date of Service: T. PM Medical Record Patient Account Number: [id number] Number: Treating RN: Phillis Haggis Date of Birth/Sex: [birth year] (82 y.o. Female) Other Clinician: Primary Care Physician: Herminio Commons Treating Evlyn Kanner Referring Physician: Herminio Commons Physician/Extender: Tania Ade in Treatment: 1 Encounter Discharge Information Items Discharge Pain Level: 0 Discharge Condition: Stable Ambulatory Status: Cane Discharge Destination: Home Transportation:  Private Auto Accompanied By: daughter Schedule Follow-up Appointment: Yes Medication Reconciliation completed Yes and provided to Patient/Care Lewellyn Fultz: Provided on Clinical Summary of Care: 06/03/2015 Form Type Recipient Paper Patient RE Electronic Signature(s) Signed: 06/03/2015 1:42:12 PM By: Gwenlyn Perking Entered By: Gwenlyn Perking on 06/03/2015 13:42:12 Shinn, Shaden T. ([id number]) -------------------------------------------------------------------------------- Lower Extremity Assessment Details [name] 06/03/2015 12:45 Patient Name: Date of Service: T. PM Medical Record Patient Account Number: [id number] Number: Treating RN: Phillis Haggis Date of Birth/Sex: 1928-09-16 (82 y.o. Female) Other Clinician: Primary Care Physician: Herminio Commons Treating Britto, Errol Referring Physician: Herminio Commons Physician/Extender: Tania Ade in Treatment: 1 Vascular Assessment Pulses: Posterior Tibial Dorsalis Pedis Palpable: [Left:Yes] [Right:Yes] Extremity colors, hair growth, and conditions: Extremity Color: [Left:Hyperpigmented] [Right:Hyperpigmented] Temperature of Extremity: [Left:Cool] [Right:Cool] Capillary Refill: [Left:> 3 seconds] [Right:> 3 seconds] Toe Nail Assessment Left: Right: Thick: No No Discolored: No No Deformed: No No Improper Length and Hygiene: Yes Yes Electronic Signature(s) Signed: 06/03/2015 5:12:54 PM By: Alejandro Mulling Entered By: Alejandro Mulling on 06/03/2015 13:06:10 Troyer, Bristyl T. ([id number]) -------------------------------------------------------------------------------- Multi Wound Chart Details [name], [name] 06/03/2015 12:45 Patient Name: Date of Service: T. PM Medical Record Patient Account Number: [id number] Number: Treating RN: Phillis Haggis Date of Birth/Sex: 02-02-1928 (82 y.o. Female) Other Clinician: Primary Care Physician: Herminio Commons Treating Evlyn Kanner Referring Physician: Herminio Commons Physician/Extender: Tania Ade in Treatment: 1 Vital Signs Height(in): 61 Pulse(bpm): 67 Weight(lbs): 108 Blood Pressure 124/39 (mmHg): Body Mass Index(BMI): 20 Temperature(F): 97.6 Respiratory Rate 20 (breaths/min): Photos: [1:No Photos] [2:No Photos] [3:No Photos] Wound Location: [1:Right Toe Great - Dorsal] [2:Left Lower Leg - Medial] [3:Left Toe Great - Dorsal] Wounding Event: [1:Blister] [2:Trauma] [3:Blister] Primary Etiology: [1:2nd degree Burn] [2:Trauma, Other] [3:2nd degree Burn] Comorbid History: [1:Cataracts, Anemia, Hypertension, History of Burn, Gout, Osteoarthritis] [2:Cataracts, Anemia, Hypertension, History of Burn, Gout, Osteoarthritis] [3:Cataracts, Anemia, Hypertension, History of Burn, Gout, Osteoarthritis] Date Acquired: [1:05/12/2015] [2:04/06/2015] [3:05/05/2015] Weeks of Treatment: [1:1] [2:1] [3:1] Wound Status: [1:Open] [2:Open] [3:Open] Measurements L x W x D 0.1x0.1x0.1 [2:2x2.5x0.1] [3:0.1x0.1x0.1] (cm) Area (cm) : [1:0.008] [2:3.927] [3:0.008] Volume (cm) : [  1:0.001] [2:0.393] [3:0.001] % Reduction in Area: [1:0.00%] [2:40.50%] [3:N/A] % Reduction in Volume: 0.00% [2:40.50%] [3:N/A] Classification: [1:Partial Thickness] [2:Partial Thickness] [3:Partial Thickness] Exudate Amount: [1:Large] [2:Large] [3:Small] Exudate Type: [1:Serous] [2:Purulent] [3:Serosanguineous] Exudate Color: [1:amber] [2:yellow, brown, green] [3:red, brown] Wound Margin: [1:Flat and Intact] [2:Thickened] [3:Distinct, outline attached] Granulation Amount: [1:None Present (0%)] [2:None Present (0%)] [3:None Present (0%)] Necrotic Amount: [1:Large (67-100%)] [2:Large (67-100%)] [3:Small (1-33%)] Necrotic Tissue: [1:Eschar] [2:Eschar, Adherent Slough] [3:Adherent Slough] Exposed Structures: [1:Fascia: No Fat: No Tendon: No Muscle: No] [2:Fascia: No Fat: No Tendon: No Muscle: No] [3:Fascia: No Fat: No Tendon: No Muscle: No] Joint: No Joint: No Joint: No Bone: No Bone:  No Bone: No Limited to Skin Limited to Skin Limited to Skin Breakdown Breakdown Breakdown Epithelialization: None None None Periwound Skin Texture: Edema: Yes Edema: Yes Edema: No Excoriation: No Induration: No Callus: No Crepitus: No Fluctuance: No Friable: No Rash: No Scarring: No Periwound Skin Moist: Yes Moist: Yes Maceration: Yes Moisture: Moist: Yes Dry/Scaly: No Periwound Skin Color: Erythema: Yes No Abnormalities Noted Atrophie Blanche: No Cyanosis: No Ecchymosis: No Erythema: No Hemosiderin Staining: No Mottled: No Pallor: No Rubor: No Erythema Location: Circumferential N/A N/A Temperature: No Abnormality No Abnormality No Abnormality Tenderness on Yes Yes Yes Palpation: Wound Preparation: Ulcer Cleansing: Ulcer Cleansing: Ulcer Cleansing: Rinsed/Irrigated with Rinsed/Irrigated with Rinsed/Irrigated with Saline Saline Saline Topical Anesthetic Topical Anesthetic Topical Anesthetic Applied: Other: lidocaine Applied: Other: lidocaine Applied: Other: lidocaine 4% 4% 4% Treatment Notes Electronic Signature(s) Signed: 06/03/2015 5:12:54 PM By: Alejandro Mulling Entered By: Alejandro Mulling on 06/03/2015 13:24:56 Friedli, Bitha T. ([id number]) -------------------------------------------------------------------------------- Multi-Disciplinary Care Plan Details Blaze, Jammi 06/03/2015 12:45 Patient Name: Date of Service: T. PM Medical Record Patient Account Number: [id number] Number: Treating RN: Phillis Haggis Date of Birth/Sex: 06-09-1928 (82 y.o. Female) Other Clinician: Primary Care Physician: Herminio Commons Treating Evlyn Kanner Referring Physician: Herminio Commons Physician/Extender: Tania Ade in Treatment: 1 Active Inactive Abuse / Safety / Falls / Self Care Management Nursing Diagnoses: Impaired physical mobility Potential for falls Self care deficit: actual or potential Goals: Patient will remain injury free Date Initiated:  05/26/2015 Goal Status: Active Patient/caregiver will verbalize understanding of the importance to maintain current immunizations/vaccinations Date Initiated: 05/26/2015 Goal Status: Active Patient/caregiver will verbalize/demonstrate measure taken to improve self care Date Initiated: 05/26/2015 Goal Status: Active Patient/caregiver will verbalize/demonstrate measures taken to improve the patient's personal safety Date Initiated: 05/26/2015 Goal Status: Active Patient/caregiver will verbalize/demonstrate measures taken to prevent injury and/or falls Date Initiated: 05/26/2015 Goal Status: Active Patient/caregiver will verbalize/demonstrate understanding of what to do in case of emergency Date Initiated: 05/26/2015 Goal Status: Active Interventions: Assess fall risk on admission and as needed Assess: immobility, friction, shearing, incontinence upon admission and as needed Assess impairment of mobility on admission and as needed per policy Assess self care needs on admission and as needed Knierim, Wilmarie T. ([id number]) Provide education on basic hygiene Provide education on fall prevention Provide education on personal and home safety Provide education on safe transfers Notes: Orientation to the Wound Care Program Nursing Diagnoses: Knowledge deficit related to the wound healing center program Goals: Patient/caregiver will verbalize understanding of the Wound Healing Center Program Date Initiated: 05/26/2015 Goal Status: Active Interventions: Provide education on orientation to the wound center Notes: Venous Leg Ulcer Nursing Diagnoses: Knowledge deficit related to disease process and management Potential for venous Insuffiency (use before diagnosis confirmed) Goals: Patient will maintain optimal edema control Date Initiated: 05/26/2015 Goal Status: Active Patient/caregiver will verbalize understanding of disease process and disease  management Date Initiated: 05/26/2015 Goal Status:  Active Verify adequate tissue perfusion prior to therapeutic compression application Date Initiated: 05/26/2015 Goal Status: Active Interventions: Assess peripheral edema status every visit. Provide education on venous insufficiency Notes: Wound/Skin Impairment Draughn, Garlene T. ([id number]) Nursing Diagnoses: Impaired tissue integrity Knowledge deficit related to smoking impact on wound healing Knowledge deficit related to ulceration/compromised skin integrity Goals: Patient/caregiver will verbalize understanding of skin care regimen Date Initiated: 05/26/2015 Goal Status: Active Ulcer/skin breakdown will have a volume reduction of 30% by week 4 Date Initiated: 05/26/2015 Goal Status: Active Ulcer/skin breakdown will have a volume reduction of 50% by week 8 Date Initiated: 05/26/2015 Goal Status: Active Ulcer/skin breakdown will have a volume reduction of 80% by week 12 Date Initiated: 05/26/2015 Goal Status: Active Ulcer/skin breakdown will heal within 14 weeks Date Initiated: 05/26/2015 Goal Status: Active Interventions: Assess patient/caregiver ability to obtain necessary supplies Assess patient/caregiver ability to perform ulcer/skin care regimen upon admission and as needed Assess ulceration(s) every visit Provide education on ulcer and skin care Treatment Activities: Referred to DME [facility] for dressing supplies : 05/26/2015 Skin care regimen initiated : 05/26/2015 Topical wound management initiated : 05/26/2015 Notes: Electronic Signature(s) Signed: 06/03/2015 5:12:54 PM By: Alejandro Mulling Entered By: Alejandro Mulling on 06/03/2015 13:24:49 Chavira, Mahek T. ([id number]) -------------------------------------------------------------------------------- Pain Assessment Details Cropp, Dior 06/03/2015 12:45 Patient Name: Date of Service: T. PM Medical Record Patient Account Number: [id number] Number: Treating RN: Phillis Haggis Date of Birth/Sex:  07-24-1928 (82 y.o. Female) Other Clinician: Primary Care Physician: Herminio Commons Treating Evlyn Kanner Referring Physician: Herminio Commons Physician/Extender: Tania Ade in Treatment: 1 Active Problems Location of Pain Severity and Description of Pain Patient Has Paino No Site Locations Pain Management and Medication Current Pain Management: Electronic Signature(s) Signed: 06/03/2015 5:12:54 PM By: Alejandro Mulling Entered By: Alejandro Mulling on 06/03/2015 12:55:09 Porcaro, Adriona T. ([id number]) -------------------------------------------------------------------------------- Patient/Caregiver Education Details Gsell, Bebe 06/03/2015 12:45 Patient Name: Date of Service: T. PM Medical Record Patient Account Number: [id number] Number: Treating RN: Phillis Haggis Date of Birth/Gender: 11-Jan-1929 (82 y.o. Female) Other Clinician: Primary Care Physician: Herminio Commons Treating Britto, Errol Referring Physician: Herminio Commons Physician/Extender: Tania Ade in Treatment: 1 Education Assessment Education Provided To: Patient Education Topics Provided Wound/Skin Impairment: Handouts: Other: change dressing as ordered Methods: Demonstration, Explain/Verbal Responses: State content correctly Electronic Signature(s) Signed: 06/03/2015 5:12:54 PM By: Alejandro Mulling Entered By: Alejandro Mulling on 06/03/2015 13:34:25 Doeden, Barbar T. ([id number]) -------------------------------------------------------------------------------- Wound Assessment Details Manwarren, Patirica 06/03/2015 12:45 Patient Name: Date of Service: T. PM Medical Record Patient Account Number: [id number] Number: Treating RN: Phillis Haggis Date of Birth/Sex: [birth year] (82 y.o. Female) Other Clinician: Primary Care Physician: Herminio Commons Treating Britto, Errol Referring Physician: Herminio Commons Physician/Extender: Tania Ade in Treatment: 1 Wound Status Wound Number: 1 Primary 2nd degree Burn Etiology: Wound  Location: Right, Dorsal Toe Great Wound Healed - Epithelialized Wounding Event: Blister Status: Date Acquired: 05/12/2015 Comorbid Cataracts, Anemia, Hypertension, Weeks Of Treatment: 1 History: History of Burn, Gout, Osteoarthritis Clustered Wound: No Photos Photo Uploaded By: Alejandro Mulling on 06/03/2015 16:57:12 Wound Measurements Length: (cm) 0 Width: (cm) 0 Depth: (cm) 0 Area: (cm) 0 Volume: (cm) 0 % Reduction in Area: 100% % Reduction in Volume: 100% Epithelialization: None Tunneling: No Undermining: No Wound Description Classification: Partial Thickness Wound Margin: Flat and Intact Exudate Amount: Large Exudate Type: Serous Exudate Color: amber Foul Odor After Cleansing: No Wound Bed Granulation Amount: None Present (0%) Exposed Structure Necrotic Amount: Large (67-100%) Fascia Exposed: No Necrotic Quality: Eschar Fat Layer  Exposed: No Bonello, Jazzmyn T. ([id number]) Tendon Exposed: No Muscle Exposed: No Joint Exposed: No Bone Exposed: No Limited to Skin Breakdown Periwound Skin Texture Texture Color No Abnormalities Noted: No No Abnormalities Noted: No Localized Edema: Yes Erythema: Yes Erythema Location: Circumferential Moisture No Abnormalities Noted: No Temperature / Pain Moist: Yes Temperature: No Abnormality Tenderness on Palpation: Yes Wound Preparation Ulcer Cleansing: Rinsed/Irrigated with Saline Topical Anesthetic Applied: Other: lidocaine 4%, Electronic Signature(s) Signed: 06/03/2015 5:12:54 PM By: Alejandro Mulling Entered By: Alejandro Mulling on 06/03/2015 13:31:00 Heng, Keianna T. ([id number]) -------------------------------------------------------------------------------- Wound Assessment Details Kuss, Dene 06/03/2015 12:45 Patient Name: Date of Service: T. PM Medical Record Patient Account Number: [id number] Number: Treating RN: Phillis Haggis Date of Birth/Sex: January 31, 1928 (82 y.o. Female) Other  Clinician: Primary Care Physician: Herminio Commons Treating Britto, Errol Referring Physician: Herminio Commons Physician/Extender: Tania Ade in Treatment: 1 Wound Status Wound Number: 2 Primary Trauma, Other Etiology: Wound Location: Left Lower Leg - Medial Wound Open Wounding Event: Trauma Status: Date Acquired: 04/06/2015 Comorbid Cataracts, Anemia, Hypertension, Weeks Of Treatment: 1 History: History of Burn, Gout, Osteoarthritis Clustered Wound: No Photos Photo Uploaded By: Alejandro Mulling on 06/03/2015 16:57:12 Wound Measurements Length: (cm) 2 Width: (cm) 2.5 Depth: (cm) 0.1 Area: (cm) 3.927 Volume: (cm) 0.393 % Reduction in Area: 40.5% % Reduction in Volume: 40.5% Epithelialization: None Tunneling: No Undermining: No Wound Description Classification: Partial Thickness Wound Margin: Thickened Exudate Amount: Large Exudate Type: Purulent Exudate Color: yellow, brown, green Foul Odor After Cleansing: No Wound Bed Granulation Amount: None Present (0%) Exposed Structure Necrotic Amount: Large (67-100%) Fascia Exposed: No Necrotic Quality: Eschar, Adherent Slough Fat Layer Exposed: No Ransom, Yanelle T. ([id number]) Tendon Exposed: No Muscle Exposed: No Joint Exposed: No Bone Exposed: No Limited to Skin Breakdown Periwound Skin Texture Texture Color No Abnormalities Noted: No No Abnormalities Noted: No Localized Edema: Yes Temperature / Pain Moisture Temperature: No Abnormality No Abnormalities Noted: No Tenderness on Palpation: Yes Moist: Yes Wound Preparation Ulcer Cleansing: Rinsed/Irrigated with Saline Topical Anesthetic Applied: Other: lidocaine 4%, Treatment Notes Wound #2 (Left, Medial Lower Leg) 1. Cleansed with: Clean wound with Normal Saline 2. Anesthetic Topical Lidocaine 4% cream to wound bed prior to debridement 4. Dressing Applied: Aquacel Ag 5. Secondary Dressing Applied Dry Gauze 7. Secured with Tape Notes conform, netting Electronic  Signature(s) Signed: 06/03/2015 5:12:54 PM By: Alejandro Mulling Entered By: Alejandro Mulling on 06/03/2015 13:07:38 Heron, Diedra T. ([id number]) -------------------------------------------------------------------------------- Wound Assessment Details Fetsch, Leonia 06/03/2015 12:45 Patient Name: Date of Service: T. PM Medical Record Patient Account Number: [id number] Number: Treating RN: Phillis Haggis Date of Birth/Sex: May 06, 1928 (82 y.o. Female) Other Clinician: Primary Care Physician: Herminio Commons Treating Britto, Errol Referring Physician: Herminio Commons Physician/Extender: Tania Ade in Treatment: 1 Wound Status Wound Number: 3 Primary 2nd degree Burn Etiology: Wound Location: Left, Dorsal Toe Great Wound Healed - Epithelialized Wounding Event: Blister Status: Date Acquired: 05/05/2015 Comorbid Cataracts, Anemia, Hypertension, Weeks Of Treatment: 1 History: History of Burn, Gout, Osteoarthritis Clustered Wound: No Photos Photo Uploaded By: Alejandro Mulling on 06/03/2015 16:57:28 Wound Measurements Length: (cm) 0 % Reductio Width: (cm) 0 % Reductio Depth: (cm) 0 Epithelial Area: (cm) 0 Tunneling Volume: (cm) 0 Undermini n in Area: n in Volume: ization: None : No ng: No Wound Description Classification: Partial Thickness Wound Margin: Distinct, outline attached Exudate Amount: Small Exudate Type: Serosanguineous Exudate Color: red, brown Foul Odor After Cleansing: No Wound Bed Granulation Amount: None Present (0%) Exposed Structure Necrotic Amount: Small (1-33%) Fascia Exposed: No Necrotic Quality: Adherent Slough  Fat Layer Exposed: No Yawn, Khailee T. ([id number]) Tendon Exposed: No Muscle Exposed: No Joint Exposed: No Bone Exposed: No Limited to Skin Breakdown Periwound Skin Texture Texture Color No Abnormalities Noted: No No Abnormalities Noted: No Callus: No Atrophie Blanche: No Crepitus: No Cyanosis: No Excoriation:  No Ecchymosis: No Fluctuance: No Erythema: No Friable: No Hemosiderin Staining: No Induration: No Mottled: No Localized Edema: No Pallor: No Rash: No Rubor: No Scarring: No Temperature / Pain Moisture Temperature: No Abnormality No Abnormalities Noted: No Tenderness on Palpation: Yes Dry / Scaly: No Maceration: Yes Moist: Yes Wound Preparation Ulcer Cleansing: Rinsed/Irrigated with Saline Topical Anesthetic Applied: Other: lidocaine 4%, Electronic Signature(s) Signed: 06/03/2015 5:12:54 PM By: Alejandro MullingPinkerton, Debra Entered By: Alejandro MullingPinkerton, Debra on 06/03/2015 13:31:00 Scherzinger, Sallye T. ([id number]) -------------------------------------------------------------------------------- Vitals Details Kiesler, Johara 06/03/2015 12:45 Patient Name: Date of Service: T. PM Medical Record Patient Account Number: [id number] Number: Treating RN: Phillis HaggisPinkerton, Debi Date of Birth/Sex: 1928/04/30 (82 y.o. Female) Other Clinician: Primary Care Physician: Herminio CommonsKIM, [name] Treating Britto, Errol Referring Physician: Herminio CommonsKIM, [name] Physician/Extender: Tania AdeWeeks in Treatment: 1 Vital Signs Time Taken: 12:55 Temperature (F): 97.6 Height (in): 61 Pulse (bpm): 67 Weight (lbs): 108 Respiratory Rate (breaths/min): 20 Body Mass Index (BMI): 20.4 Blood Pressure (mmHg): 124/39 Reference Range: 80 - 120 mg / dl Electronic Signature(s) Signed: 06/03/2015 5:12:54 PM By: Alejandro MullingPinkerton, Debra Entered By: Alejandro MullingPinkerton, Debra on 06/03/2015 13:00:19

## 2015-06-13 ENCOUNTER — Encounter: Payer: Medicare Other | Admitting: Surgery

## 2015-06-13 DIAGNOSIS — L97511 Non-pressure chronic ulcer of other part of right foot limited to breakdown of skin: Secondary | ICD-10-CM | POA: Diagnosis not present

## 2015-06-13 DIAGNOSIS — L551 Sunburn of second degree: Secondary | ICD-10-CM | POA: Diagnosis not present

## 2015-06-13 DIAGNOSIS — I890 Lymphedema, not elsewhere classified: Secondary | ICD-10-CM | POA: Diagnosis not present

## 2015-06-13 DIAGNOSIS — M810 Age-related osteoporosis without current pathological fracture: Secondary | ICD-10-CM | POA: Diagnosis not present

## 2015-06-13 DIAGNOSIS — L97521 Non-pressure chronic ulcer of other part of left foot limited to breakdown of skin: Secondary | ICD-10-CM | POA: Diagnosis not present

## 2015-06-13 DIAGNOSIS — S81802A Unspecified open wound, left lower leg, initial encounter: Secondary | ICD-10-CM | POA: Diagnosis not present

## 2015-06-13 DIAGNOSIS — I10 Essential (primary) hypertension: Secondary | ICD-10-CM | POA: Diagnosis not present

## 2015-06-13 DIAGNOSIS — L97222 Non-pressure chronic ulcer of left calf with fat layer exposed: Secondary | ICD-10-CM | POA: Diagnosis not present

## 2015-06-15 NOTE — Progress Notes (Signed)
[name], [name] ([id number]) Visit Report for 06/13/2015 Chief Complaint Document Details [name], [name] 06/13/2015 12:45 Patient Name: Date of Service: [name] Medical Record Patient Account Number: [id number] Number: Treating RN: [name] Date of Birth/Sex: 01-17-1929 (82 y.o. Female) Other Clinician: Primary Care Treating [name] Physician: Physician/Extender: Referring Physician: Herminio CommonsKIM, [name] Weeks in Treatment: 2 Information Obtained from: Patient Chief Complaint Patient seen for complaints of Non-Healing Wound to the left lower extremity for about 2 months and to the toes on both feet for about 2 weeks Electronic Signature(s) Signed: 06/13/2015 1:14:10 [name] By: [name], [name] Entered By: [name], Arland Usery on 06/13/2015 13:14:10 [name], [name] ([id number]) -------------------------------------------------------------------------------- Debridement Details [name] 06/13/2015 12:45 Patient Name: Date of Service: [name] Medical Record Patient Account Number: [id number] Number: Treating RN: [name] Date of Birth/Sex: 01-17-1929 (82 y.o. Female) Other Clinician: Primary Care Treating [name], [name] Kynleigh Artz Physician: Physician/Extender: Referring Physician: Herminio CommonsKIM, [name] Weeks in Treatment: 2 Debridement Performed for Wound #2 Left,Medial Lower Leg Assessment: Performed By: Physician [name], Tearah Saulsbury, MD Debridement: Debridement Pre-procedure Yes Verification/Time Out Taken: Start Time: 13:05 Pain Control: Lidocaine 4% Topical Solution Level: Skin/Subcutaneous Tissue Total Area Debrided (L x 1.2 (cm) x 2.5 (cm) = 3 (cm) W): Tissue and other Viable, Non-Viable, Exudate, Fibrin/Slough, Subcutaneous material debrided: Instrument: Curette Bleeding: Minimum Hemostasis Achieved: Pressure End Time: 13:07 Procedural Pain: 0 Post Procedural Pain: 0 Response to Treatment: Procedure was tolerated well Post  Debridement Measurements of Total Wound Length: (cm) 1.2 Width: (cm) 2.5 Depth: (cm) 0.2 Volume: (cm) 0.471 Post Procedure Diagnosis Same as Pre-procedure Electronic Signature(s) Signed: 06/13/2015 1:14:05 [name] By: [name], Jeanice Dempsey MD, [name] Signed: 06/14/2015 4:50:53 [name] By: Alejandro MullingPinkerton, Debra Entered By: [name], Fishel Wamble on 06/13/2015 13:14:05 Register, [name] ([id number]) [name], [name] ([id number]) -------------------------------------------------------------------------------- HPI Details [name] 06/13/2015 12:45 Patient Name: Date of Service: [name] Medical Record Patient Account Number: [id number] Number: Treating RN: [name] Date of Birth/Sex: 01-17-1929 (82 y.o. Female) Other Clinician: Primary Care Treating [name], [name] [name] Physician: Physician/Extender: Referring Physician: Herminio CommonsKIM, [name] Weeks in Treatment: 2 History of Present Illness Location: left lower extremity and both right and left big toes Quality: Patient reports experiencing a dull pain to affected area(s). Severity: Patient states wound are getting worse. Duration: Patient has had the wound for > 2 months prior to seeking treatment at the wound center Timing: Pain in wound is Intermittent (comes and goes Context: The wound occurred when the patient had a lacerated wound to her left lateral calf about 2 months ago with a blunt injury and then sunburnt her feet about 2 weeks ago Modifying Factors: Other treatment(s) tried include:antibiotics and local care Associated Signs and Symptoms: Patient reports having increase swelling. HPI Description: 82 year old patient with a past medical history of essential hypertension, osteoporosis, vitamin D deficiency, pernicious anemia. she was recently seen by her PCP Dr. Herold HarmsHanna [name] for a wound on the left leg with cellulitis of the leg and foot. she is also status post appendectomy, left hip arthroplasty.She has been treated with several  courses of antibiotics and wound care and was improving but then went and had a sunburn to both feet and got infected and had another course of Bactrim and Keflex. she had a blister on the right big toe that was red and inflamed. she is also had some recent x-rays had an x-ray of the left ankle which showed no acute osseous findings are also had x-ray of the left tibia and  fibula which was a normal tibia and fibula. on 05/03/2015 she had a venous duplex study of her left lower extremity which showed no evidence of deep or superficial venous thrombosis or incompetence. She is a former smoker and quit in 2008 June Electronic Signature(s) Signed: 06/13/2015 1:14:54 [name] By: [name] Kanner MD, [name] Entered By: [name] Kanner on 06/13/2015 13:14:54 [name], [name] ([id number]) -------------------------------------------------------------------------------- Physical Exam Details [name] 06/13/2015 12:45 Patient Name: Date of Service: [name] Medical Record Patient Account Number: [id number] Number: Treating RN: [name] Haggis Date of Birth/Sex: 1928/12/09 (82 y.o. Female) Other Clinician: Primary Care Treating [name], [name] Jean Skow Physician: Physician/Extender: Referring Physician: Herminio Commons Weeks in Treatment: 2 Constitutional . Pulse regular. Respirations normal and unlabored. Afebrile. . Eyes Nonicteric. Reactive to light. Ears, Nose, Mouth, and Throat Lips, teeth, and gums WNL.Marland Kitchen Moist mucosa without lesions. Neck supple and nontender. No palpable supraclavicular or cervical adenopathy. Normal sized without goiter. Respiratory WNL. No retractions.. Cardiovascular Pedal Pulses WNL. she has bilateral stage I lymphedema on the left is a little worse than the right.. Lymphatic No adneopathy. No adenopathy. No adenopathy. Musculoskeletal Adexa without tenderness or enlargement.. Digits and nails w/o clubbing, cyanosis, infection, petechiae, ischemia, or  inflammatory conditions.. Integumentary (Hair, Skin) No suspicious lesions. No crepitus or fluctuance. No peri-wound warmth or erythema. No masses.Marland Kitchen Psychiatric Judgement and insight Intact.. No evidence of depression, anxiety, or agitation.. Notes the wound on the left lower extremity continues to have some subcutaneous debris and I'm sharply debrided this with a #3 curet. She has the wound washed out well with saline and there is healthy granulation tissue under this with bleeding controlled with pressure. Electronic Signature(s) Signed: 06/13/2015 1:15:44 [name] By: [name] Kanner MD, [name] Entered By: [name] Kanner on 06/13/2015 13:15:44 [name] ([id number]) -------------------------------------------------------------------------------- Physician Orders Details [name] 06/13/2015 12:45 Patient Name: Date of Service: [name] Medical Record Patient Account Number: [id number] Number: Treating RN: [name] Haggis Date of Birth/Sex: 11-09-1928 (82 y.o. Female) Other Clinician: Primary Care Treating [name], [name] Maecyn Panning Physician: Physician/Extender: Referring Physician: Herminio Commons Weeks in Treatment: 2 Verbal / Phone Orders: Yes Clinician: Pinkerton, [name] Read Back and Verified: Yes Diagnosis Coding Wound Cleansing Wound #2 Left,Medial Lower Leg o Cleanse wound with mild soap and water o May Shower, gently pat wound dry prior to applying new dressing. Anesthetic Wound #2 Left,Medial Lower Leg o Topical Lidocaine 4% cream applied to wound bed prior to debridement - for office use only Primary Wound Dressing Wound #2 Left,Medial Lower Leg o Aquacel Ag Secondary Dressing Wound #2 Left,Medial Lower Leg o Conform/Kerlix - netting Dressing Change Frequency Wound #2 Left,Medial Lower Leg o Change dressing every other day. Follow-up Appointments Wound #2 Left,Medial Lower Leg o Return Appointment in 1 week. Edema Control Wound #2  Left,Medial Lower Leg o Elevate legs to the level of the heart and pump ankles as often as possible Additional Orders / Instructions Wound #2 Left,Medial Lower Leg o Increase protein intake. [name], [name] ([id number]) Medications-please add to medication list. Wound #2 Left,Medial Lower Leg o Other: - Vitamin C, Zinc, Vitamin A, Multivitamin Electronic Signature(s) Signed: 06/13/2015 3:39:56 [name] By: [name] Kanner MD, [name] Signed: 06/14/2015 4:50:53 [name] By: Alejandro Mulling Entered By: Alejandro Mulling on 06/13/2015 13:17:43 [name], [name] ([id number]) -------------------------------------------------------------------------------- Problem List Details [name] 06/13/2015 12:45 Patient Name: Date of Service: [name] Medical Record Patient Account Number: [id number] Number: Treating RN: [name] Haggis Date of Birth/Sex: 17-Feb-1928 (82 y.o. 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 ([id number]) -------------------------------------------------------------------------------- Progress Note Details [name] 06/13/2015 12:45 Patient Name: Date of Service: [name] Medical Record  Patient Account Number: [id number] Number: Treating RN: [name] Haggis Date of Birth/Sex: 17-Dec-1928 (82 y.o. Female) Other Clinician: Primary Care Treating [name], [name] Lean Fayson Physician: Physician/Extender: Referring Physician: Herminio Commons Weeks in Treatment: 2 Subjective Chief Complaint Information obtained from Patient Patient seen for complaints of Non-Healing Wound to the left lower extremity for about 2 months and to the toes on both feet for about 2 weeks History of Present Illness (HPI) The following HPI elements were documented for the patient's wound: Location: left lower extremity and both right and left big toes Quality: Patient reports experiencing a dull pain to affected area(s). Severity: Patient states wound are getting worse. Duration: Patient has had the wound for > 2 months prior to seeking treatment at the wound center Timing: Pain in wound is Intermittent (comes and goes Context: The wound occurred when the patient had a lacerated wound to her left lateral calf about 2 months ago with a blunt injury and then sunburnt her feet about 2 weeks ago Modifying Factors: Other treatment(s) tried include:antibiotics and local care Associated Signs and Symptoms: Patient reports having increase swelling. 82 year old patient with a past medical history of essential hypertension, osteoporosis, vitamin D deficiency, pernicious anemia. she was recently seen by her PCP Dr. Herold Harms for a wound on the left leg with cellulitis of the leg and foot. she is also status post appendectomy, left hip arthroplasty.She has been treated with several courses of antibiotics and wound care and was improving but then went and had a sunburn to both feet and got infected and had another course of Bactrim and Keflex. she had a blister on the right big toe that was red and inflamed. she is also had some recent x-rays had an x-ray of the left ankle which showed no acute osseous  findings are also had x-ray of the left tibia and fibula which was a normal tibia and fibula. on 05/03/2015 she had a venous duplex study of her left lower extremity which showed no evidence of deep or superficial venous thrombosis or incompetence. She is a former smoker and quit in 2008 June [name], Ivanhoe T. ([id number]) Objective Constitutional Pulse regular. Respirations normal and unlabored. Afebrile. Vitals Time Taken: 12:50 [name], Height: 61 in, Weight: 108 lbs, BMI: 20.4, Pulse: 63 bpm, Respiratory Rate: 20 breaths/min, Blood Pressure: 131/46 mmHg. Eyes Nonicteric. Reactive to light. Ears, Nose, Mouth, and Throat Lips, teeth, and gums WNL.Marland Kitchen Moist mucosa without lesions. Neck supple and nontender. No palpable supraclavicular or cervical adenopathy. Normal sized without goiter. Respiratory WNL. No retractions.. Cardiovascular Pedal Pulses WNL. she has bilateral stage I lymphedema on the left is a little worse than the right.. Lymphatic No adneopathy. No adenopathy. No adenopathy. Musculoskeletal Adexa without tenderness or enlargement.. Digits  and nails w/o clubbing, cyanosis, infection, petechiae, ischemia, or inflammatory conditions.Marland Kitchen Psychiatric Judgement and insight Intact.. No evidence of depression, anxiety, or agitation.. General Notes: the wound on the left lower extremity continues to have some subcutaneous debris and I'm sharply debrided this with a #3 curet. She has the wound washed out well with saline and there is healthy granulation tissue under this with bleeding controlled with pressure. Integumentary (Hair, Skin) No suspicious lesions. No crepitus or fluctuance. No peri-wound warmth or erythema. No masses.. Wound #2 status is Open. Original cause of wound was Trauma. The wound is located on the Left,Medial Lower Leg. The wound measures 1.2cm length x 2.5cm width x 0.1cm depth; 2.356cm^2 area and 0.236cm^3 volume. The wound is limited to skin breakdown. There  is no tunneling or undermining noted. There is a large amount of purulent drainage noted. The wound margin is thickened. There is no granulation within the wound bed. There is a large (67-100%) amount of necrotic tissue within the wound bed including Eschar and Adherent Slough. The periwound skin appearance exhibited: Localized Edema, Moist. Weekley, Chancy T. ([id number]) Periwound temperature was noted as No Abnormality. The periwound has tenderness on palpation. Assessment Active Problems ICD-10 L97.222 - Non-pressure chronic ulcer of left calf with fat layer exposed L55.1 - Sunburn of second degree L97.511 - Non-pressure chronic ulcer of other part of right foot limited to breakdown of skin L97.521 - Non-pressure chronic ulcer of other part of left foot limited to breakdown of skin I89.0 - Lymphedema, not elsewhere classified I have recommended we continue with: 1. a bland skin ointment to the toes and a light dressing over this and offloading it as much as possible 2. Aquacel Ag to the left lower extremity and a light Kerlix bandage to keep in place as the skin is very fragile due to being on steroids long-term. 3. I also recommended compression stockings of the 20-30 mm variety to be wound all day to reduce the lymphedema. 4. Elevation and exercise. 5. regular visits to the wound center. she and her daughter have had all QUESTIONS answered Procedures Wound #2 Wound #2 is a Trauma, Other located on the Left,Medial Lower Leg . There was a Skin/Subcutaneous Tissue Debridement ([id number]) debridement with total area of 3 sq cm performed by [name] Kanner, MD. with the following instrument(s): Curette to remove Viable and Non-Viable tissue/material including Exudate, Fibrin/Slough, and Subcutaneous after achieving pain control using Lidocaine 4% Topical Solution. A time out was conducted prior to the start of the procedure. A Minimum amount of bleeding was controlled with Pressure.  The procedure was tolerated well with a pain level of 0 throughout and a pain level of 0 following the procedure. Post Debridement Measurements: 1.2cm length x 2.5cm width x 0.2cm depth; 0.471cm^3 volume. Post procedure Diagnosis Wound #2: Same as Pre-Procedure Mallette, Kaylon T. ([id number]) Plan Wound Cleansing: Wound #2 Left,Medial Lower Leg: Cleanse wound with mild soap and water May Shower, gently pat wound dry prior to applying new dressing. Anesthetic: Wound #2 Left,Medial Lower Leg: Topical Lidocaine 4% cream applied to wound bed prior to debridement - for office use only Primary Wound Dressing: Wound #2 Left,Medial Lower Leg: Aquacel Ag Secondary Dressing: Wound #2 Left,Medial Lower Leg: Conform/Kerlix - netting Dressing Change Frequency: Wound #2 Left,Medial Lower Leg: Change dressing every other day. Follow-up Appointments: Wound #2 Left,Medial Lower Leg: Return Appointment in 1 week. Edema Control: Wound #2 Left,Medial Lower Leg: Elevate legs to the level of the heart and pump ankles as often  as possible Additional Orders / Instructions: Wound #2 Left,Medial Lower Leg: Increase protein intake. Medications-please add to medication list.: Wound #2 Left,Medial Lower Leg: Other: - Vitamin C, Zinc, Vitamin A, Multivitamin I have recommended we continue with: 1. a bland skin ointment to the toes and a light dressing over this and offloading it as much as possible 2. Aquacel Ag to the left lower extremity and a light Kerlix bandage to keep in place as the skin is very fragile due to being on steroids long-term. 3. I also recommended compression stockings of the 20-30 mm variety to be wound all day to reduce the lymphedema. 4. Elevation and exercise. 5. regular visits to the wound center. CHARESE, ABUNDIS ([id number]) she and her daughter have had all QUESTIONS answered Electronic Signature(s) Signed: 06/13/2015 3:45:56 [name] By: [name] Kanner MD, [name] Previous  Signature: 06/13/2015 1:17:07 [name] Version By: [name] Kanner MD, [name] Entered By: [name] Kanner on 06/13/2015 15:45:56 Sokolow, Giuseppina T. ([id number]) -------------------------------------------------------------------------------- SuperBill Details Patient Name: Pignato, Lyann T. Date of Service: 06/13/2015 Medical Record Number: [id number] Patient Account Number: [id number] Date of Birth/Sex: April 19, 1928 (82 y.o. 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 exposed L55.1 Sunburn of second degree L97.511 Non-pressure chronic ulcer of other part of right foot limited to breakdown of skin L97.521 Non-pressure chronic ulcer of other part of left foot limited to breakdown of skin I89.0 Lymphedema, not elsewhere classified Facility Procedures CPT4: Description Modifier Quantity Code [id number] 11042 - DEB SUBQ TISSUE 20 SQ CM/< 1 ICD-10 Description Diagnosis L97.222 Non-pressure chronic ulcer of left calf with fat layer exposed L97.511 Non-pressure chronic ulcer of other part of right foot  limited to breakdown of skin L97.521 Non-pressure chronic ulcer of other part of left foot limited to breakdown of skin I89.0 Lymphedema, not elsewhere classified Physician Procedures CPT4: Description Modifier Quantity Code [id number] 11042 - WC PHYS SUBQ TISS 20 SQ CM 1 ICD-10 Description Diagnosis L97.222 Non-pressure chronic ulcer of left calf with fat layer exposed L97.511 Non-pressure chronic ulcer of other part of right foot  limited to breakdown of skin L97.521 Non-pressure chronic ulcer of other part of left foot limited to breakdown of skin I89.0 Lymphedema, not elsewhere classified Kessner, Ketzia T. ([id number]) Electronic Signature(s) Signed: 06/13/2015  1:17:31 [name] By: [name] Kanner MD, [name] Entered By: [name] Kanner on 06/13/2015 13:17:31
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
## 2015-06-23 ENCOUNTER — Encounter: Payer: Medicare Other | Attending: Surgery | Admitting: Surgery

## 2015-06-23 DIAGNOSIS — L97511 Non-pressure chronic ulcer of other part of right foot limited to breakdown of skin: Secondary | ICD-10-CM | POA: Diagnosis not present

## 2015-06-23 DIAGNOSIS — I890 Lymphedema, not elsewhere classified: Secondary | ICD-10-CM | POA: Diagnosis not present

## 2015-06-23 DIAGNOSIS — L97222 Non-pressure chronic ulcer of left calf with fat layer exposed: Secondary | ICD-10-CM | POA: Diagnosis not present

## 2015-06-23 DIAGNOSIS — M109 Gout, unspecified: Secondary | ICD-10-CM | POA: Diagnosis not present

## 2015-06-23 DIAGNOSIS — D510 Vitamin B12 deficiency anemia due to intrinsic factor deficiency: Secondary | ICD-10-CM | POA: Insufficient documentation

## 2015-06-23 DIAGNOSIS — Z87891 Personal history of nicotine dependence: Secondary | ICD-10-CM | POA: Diagnosis not present

## 2015-06-23 DIAGNOSIS — I10 Essential (primary) hypertension: Secondary | ICD-10-CM | POA: Diagnosis not present

## 2015-06-23 DIAGNOSIS — M810 Age-related osteoporosis without current pathological fracture: Secondary | ICD-10-CM | POA: Insufficient documentation

## 2015-06-23 DIAGNOSIS — Z79899 Other long term (current) drug therapy: Secondary | ICD-10-CM | POA: Diagnosis not present

## 2015-06-23 DIAGNOSIS — S81802A Unspecified open wound, left lower leg, initial encounter: Secondary | ICD-10-CM | POA: Diagnosis not present

## 2015-06-24 NOTE — Progress Notes (Signed)
[name], [name] ([id number]) Visit Report for 06/23/2015 Chief Complaint Document Details [name] 06/23/2015 1:30 Patient Name: Date of Service: T. PM Medical Record Patient Account Number: [id number] Number: Treating RN: Huel Coventry Date of Birth/Sex: 1928/08/03 (82 y.o. Female) Other Clinician: Primary Care Physician: Herminio Commons Treating Evlyn Kanner Referring Physician: Herminio Commons Physician/Extender: Tania Ade in Treatment: 4 Information Obtained from: Patient Chief Complaint Patient seen for complaints of Non-Healing Wound to the left lower extremity for about 2 months and to the toes on both feet for about 2 weeks Electronic Signature(s) Signed: 06/23/2015 2:13:15 PM By: Evlyn Kanner MD, FACS Entered By: Evlyn Kanner on 06/23/2015 14:13:15 Mirarchi, Ahrianna T. ([id number]) -------------------------------------------------------------------------------- [name] 06/23/2015 1:30 Patient Name: Date of Service: T. PM Medical Record Patient Account Number: [id number] Number: Treating RN: Huel Coventry Date of Birth/Sex: 08-07-1928 (82 y.o. Female) Other Clinician: Primary Care Physician: Herminio Commons Treating Adisyn Ruscitti Referring Physician: Herminio Commons Physician/Extender: Tania Ade in Treatment: 4 [name] Performed for Wound #2 Left,Medial Lower Leg Assessment: Performed By: Physician Evlyn Kanner, MD [name]: [name] Pre-procedure Yes Verification/Time Out Taken: Start Time: 14:04 Pain Control: Other : lidocaine 4% Level: Skin/Subcutaneous Tissue Total Area Debrided (L x 1.1 (cm) x 1.1 (cm) = 1.21 (cm) W): Tissue and other Viable, Non-Viable, Exudate, Fibrin/Slough, Subcutaneous material debrided: Instrument: Curette Bleeding: Minimum Hemostasis Achieved: Pressure End Time: 14:09 Procedural Pain: 0 Post Procedural Pain: 0 Response to Treatment: Procedure was tolerated well Post [name]  Measurements of Total Wound Length: (cm) 1.1 Width: (cm) 1.1 Depth: (cm) 0.2 Volume: (cm) 0.19 Post Procedure Diagnosis Same as Pre-procedure Electronic Signature(s) Signed: 06/23/2015 2:13:10 PM By: Evlyn Kanner MD, FACS Signed: 06/23/2015 5:33:54 PM By: Elliot Gurney RN, [name], [name], [name] Entered By: Evlyn Kanner on 06/23/2015 14:13:10 Buchner, Cheynne T. ([id number]) -------------------------------------------------------------------------------- HPI Details [name] 06/23/2015 1:30 Patient Name: Date of Service: T. PM Medical Record Patient Account Number: [id number] Number: Treating RN: Huel Coventry Date of Birth/Sex: 1928/01/24 (82 y.o. Female) Other Clinician: Primary Care Physician: Herminio Commons Treating Evlyn Kanner Referring Physician: Herminio Commons Physician/Extender: Tania Ade in Treatment: 4 History of Present Illness Location: left lower extremity and both right and left big toes Quality: Patient reports experiencing a dull pain to affected area(s). Severity: Patient states wound are getting worse. Duration: Patient has had the wound for > 2 months prior to seeking treatment at the wound center Timing: Pain in wound is Intermittent (comes and goes Context: The wound occurred when the patient had a lacerated wound to her left lateral calf about 2 months ago with a blunt injury and then sunburnt her feet about 2 weeks ago Modifying Factors: Other treatment(s) tried include:antibiotics and local care Associated Signs and Symptoms: Patient reports having increase swelling. HPI Description: 82 year old patient with a past medical history of essential hypertension, osteoporosis, vitamin D deficiency, pernicious anemia. she was recently seen by her PCP Dr. Herold Harms for a wound on the left leg with cellulitis of the leg and foot. she is also status post appendectomy, left hip arthroplasty.She has been treated with several courses of antibiotics and wound care and was  improving but then went and had a sunburn to both feet and got infected and had another course of Bactrim and Keflex. she had a blister on the right big toe that was red and inflamed. she is also had some recent x-rays had an x-ray of the left ankle which showed no acute osseous findings are also had x-ray of the left tibia and  fibula which was a normal tibia and fibula. on 05/03/2015 she had a venous duplex study of her left lower extremity which showed no evidence of deep or superficial venous thrombosis or incompetence. She is a former smoker and quit in 2008 June Electronic Signature(s) Signed: 06/23/2015 2:13:19 PM By: Evlyn KannerBritto, Staley Lunz MD, FACS Entered By: Evlyn KannerBritto, Laronica Bhagat on 06/23/2015 14:13:19 Claassen, Ramah T. ([id number]) -------------------------------------------------------------------------------- Physical Exam Details [name] 06/23/2015 1:30 Patient Name: Date of Service: T. PM Medical Record Patient Account Number: [id number] Number: Treating RN: Huel CoventryWoody, [name] Date of Birth/Sex: [birth year] (82 y.o. Female) Other Clinician: Primary Care Physician: Herminio CommonsKIM, [name] Treating Evlyn KannerBritto, Abbott Jasinski Referring Physician: Herminio CommonsKIM, [name] Physician/Extender: Tania AdeWeeks in Treatment: 4 Constitutional . Pulse regular. Respirations normal and unlabored. Afebrile. . Eyes Nonicteric. Reactive to light. Ears, Nose, Mouth, and Throat Lips, teeth, and gums WNL.Marland Kitchen. Moist mucosa without lesions. Neck supple and nontender. No palpable supraclavicular or cervical adenopathy. Normal sized without goiter. Respiratory WNL. No retractions.. Cardiovascular Pedal Pulses WNL. No clubbing, cyanosis or edema. Lymphatic No adneopathy. No adenopathy. No adenopathy. Musculoskeletal Adexa without tenderness or enlargement.. Digits and nails w/o clubbing, cyanosis, infection, petechiae, ischemia, or inflammatory conditions.. Integumentary (Hair, Skin) No suspicious lesions. No crepitus or fluctuance. No  peri-wound warmth or erythema. No masses.Marland Kitchen. Psychiatric Judgement and insight Intact.. No evidence of depression, anxiety, or agitation.. Notes the wound looks much smaller and has some subcutaneous debris which have sharply removed with a #3 curet and mild bleeding controlled with pressure. She has healthy granulation tissue at the base Electronic Signature(s) Signed: 06/23/2015 2:14:00 PM By: Evlyn KannerBritto, Godson Pollan MD, FACS Entered By: Evlyn KannerBritto, Franz Svec on 06/23/2015 14:14:00 Kuznia, Aiden Marland Kitchen. ([id number]) -------------------------------------------------------------------------------- Physician Orders Details Huss, Dashanti 06/23/2015 1:30 Patient Name: Date of Service: T. PM Medical Record Patient Account Number: [id number] Number: Treating RN: Huel CoventryWoody, [name] Date of Birth/Sex: [birth year] (82 y.o. Female) Other Clinician: Primary Care Physician: Herminio CommonsKIM, [name] Treating Evlyn KannerBritto, Rosangelica Pevehouse Referring Physician: Herminio CommonsKIM, [name] Physician/Extender: Tania AdeWeeks in Treatment: 4 Verbal / Phone Orders: Yes Clinician: Huel CoventryWoody, [name] Read Back and Verified: Yes Diagnosis Coding Wound Cleansing Wound #2 Left,Medial Lower Leg o Cleanse wound with mild soap and water o May Shower, gently pat wound dry prior to applying new dressing. Anesthetic Wound #2 Left,Medial Lower Leg o Topical Lidocaine 4% cream applied to wound bed prior to [name] - for office use only Primary Wound Dressing Wound #2 Left,Medial Lower Leg o Prisma Ag Secondary Dressing Wound #2 Left,Medial Lower Leg o Conform/Kerlix - netting Dressing Change Frequency Wound #2 Left,Medial Lower Leg o Change dressing every other day. Follow-up Appointments Wound #2 Left,Medial Lower Leg o Return Appointment in 1 week. Edema Control Wound #2 Left,Medial Lower Leg o Elevate legs to the level of the heart and pump ankles as often as possible Additional Orders / Instructions Wound #2 Left,Medial Lower Leg o Increase protein  intake. Bassett, Lilliona T. ([id number]) Medications-please add to medication list. Wound #2 Left,Medial Lower Leg o Other: - Vitamin C, Zinc, Vitamin A, Multivitamin Electronic Signature(s) Signed: 06/23/2015 4:11:18 PM By: Evlyn KannerBritto, Jacilyn Sanpedro MD, FACS Signed: 06/23/2015 5:33:54 PM By: Elliot GurneyWoody, RN, [name], [name], [name] Entered By: Elliot GurneyWoody, RN, [name], [name] on 06/23/2015 14:08:55 Rowser, Santosha T. ([id number]) -------------------------------------------------------------------------------- Problem List Details Wilkie, Monice 06/23/2015 1:30 Patient Name: Date of Service: T. PM Medical Record Patient Account Number: [id number] Number: Treating RN: Huel CoventryWoody, [name] Date of Birth/Sex: [birth year] (82 y.o. Female) Other Clinician: Primary Care Physician: Herminio CommonsKIM, [name] Treating Evlyn KannerBritto, Ansen Sayegh Referring Physician: Herminio CommonsKIM, [name] Physician/Extender: Tania AdeWeeks in Treatment: 4 Active  Problems ICD-10 Encounter Code Description Active Date Diagnosis L97.222 Non-pressure chronic ulcer of left calf with fat layer 05/26/2015 Yes exposed L97.511 Non-pressure chronic ulcer of other part of right foot 05/26/2015 Yes limited to breakdown of skin I89.0 Lymphedema, not elsewhere classified 06/13/2015 Yes Inactive Problems Resolved Problems ICD-10 Code Description Active Date Resolved Date L55.1 Sunburn of second degree 05/26/2015 05/26/2015 L97.521 Non-pressure chronic ulcer of other part of left foot limited 05/26/2015 05/26/2015 to breakdown of skin Electronic Signature(s) Signed: 06/23/2015 2:15:48 PM By: Evlyn Kanner MD, FACS Previous Signature: 06/23/2015 2:13:03 PM Version By: Evlyn Kanner MD, FACS Entered By: Evlyn Kanner on 06/23/2015 14:15:48 Kuna, Jenika T. ([id number]) -------------------------------------------------------------------------------- Progress Note Details Brinker, Ralyn 06/23/2015 1:30 Patient Name: Date of Service: T. PM Medical Record Patient Account Number:  [id number] Number: Treating RN: Huel Coventry Date of Birth/Sex: 1928/03/23 (82 y.o. Female) Other Clinician: Primary Care Physician: Herminio Commons Treating Dotty Gonzalo Referring Physician: Herminio Commons Physician/Extender: Tania Ade in Treatment: 4 Subjective Chief Complaint Information obtained from Patient Patient seen for complaints of Non-Healing Wound to the left lower extremity for about 2 months and to the toes on both feet for about 2 weeks History of Present Illness (HPI) The following HPI elements were documented for the patient's wound: Location: left lower extremity and both right and left big toes Quality: Patient reports experiencing a dull pain to affected area(s). Severity: Patient states wound are getting worse. Duration: Patient has had the wound for > 2 months prior to seeking treatment at the wound center Timing: Pain in wound is Intermittent (comes and goes Context: The wound occurred when the patient had a lacerated wound to her left lateral calf about 2 months ago with a blunt injury and then sunburnt her feet about 2 weeks ago Modifying Factors: Other treatment(s) tried include:antibiotics and local care Associated Signs and Symptoms: Patient reports having increase swelling. 82 year old patient with a past medical history of essential hypertension, osteoporosis, vitamin D deficiency, pernicious anemia. she was recently seen by her PCP Dr. Herold Harms for a wound on the left leg with cellulitis of the leg and foot. she is also status post appendectomy, left hip arthroplasty.She has been treated with several courses of antibiotics and wound care and was improving but then went and had a sunburn to both feet and got infected and had another course of Bactrim and Keflex. she had a blister on the right big toe that was red and inflamed. she is also had some recent x-rays had an x-ray of the left ankle which showed no acute osseous findings are also had x-ray of the left  tibia and fibula which was a normal tibia and fibula. on 05/03/2015 she had a venous duplex study of her left lower extremity which showed no evidence of deep or superficial venous thrombosis or incompetence. She is a former smoker and quit in 2008 June Objective Dowland, Noonday T. ([id number]) Constitutional Pulse regular. Respirations normal and unlabored. Afebrile. Vitals Time Taken: 1:51 PM, Height: 61 in, Weight: 108 lbs, BMI: 20.4, Temperature: 97.6 F, Pulse: 66 bpm, Respiratory Rate: 20 breaths/min, Blood Pressure: 120/82 mmHg. Eyes Nonicteric. Reactive to light. Ears, Nose, Mouth, and Throat Lips, teeth, and gums WNL.Marland Kitchen Moist mucosa without lesions. Neck supple and nontender. No palpable supraclavicular or cervical adenopathy. Normal sized without goiter. Respiratory WNL. No retractions.. Cardiovascular Pedal Pulses WNL. No clubbing, cyanosis or edema. Lymphatic No adneopathy. No adenopathy. No adenopathy. Musculoskeletal Adexa without tenderness or enlargement.. Digits and nails w/o clubbing, cyanosis, infection, petechiae,  ischemia, or inflammatory conditions.Marland Kitchen Psychiatric Judgement and insight Intact.. No evidence of depression, anxiety, or agitation.. General Notes: the wound looks much smaller and has some subcutaneous debris which have sharply removed with a #3 curet and mild bleeding controlled with pressure. She has healthy granulation tissue at the base Integumentary (Hair, Skin) No suspicious lesions. No crepitus or fluctuance. No peri-wound warmth or erythema. No masses.. Wound #2 status is Open. Original cause of wound was Trauma. The wound is located on the Left,Medial Lower Leg. The wound measures 1.1cm length x 1.1cm width x 0.2cm depth; 0.95cm^2 area and 0.19cm^3 volume. The wound is limited to skin breakdown. There is no tunneling or undermining noted. There is a large amount of purulent drainage noted. The wound margin is thickened. There is small  (1-33%) granulation within the wound bed. There is a large (67-100%) amount of necrotic tissue within the wound bed including Adherent Slough. The periwound skin appearance exhibited: Localized Edema, Moist. Periwound temperature was noted as No Abnormality. The periwound has tenderness on palpation. Beske, Brynlie T. ([id number]) Assessment Active Problems ICD-10 [phone] - Non-pressure chronic ulcer of left calf with fat layer exposed L97.511 - Non-pressure chronic ulcer of other part of right foot limited to breakdown of skin I89.0 - Lymphedema, not elsewhere classified Procedures Wound #2 Wound #2 is a Trauma, Other located on the Left,Medial Lower Leg . There was a Skin/Subcutaneous Tissue [name] ([id number]) [name] with total area of 1.21 sq cm performed by Evlyn Kanner, MD. with the following instrument(s): Curette to remove Viable and Non-Viable tissue/material including Exudate, Fibrin/Slough, and Subcutaneous after achieving pain control using Other (lidocaine 4%). A time out was conducted prior to the start of the procedure. A Minimum amount of bleeding was controlled with Pressure. The procedure was tolerated well with a pain level of 0 throughout and a pain level of 0 following the procedure. Post [name] Measurements: 1.1cm length x 1.1cm width x 0.2cm depth; 0.19cm^3 volume. Post procedure Diagnosis Wound #2: Same as Pre-Procedure Plan Wound Cleansing: Wound #2 Left,Medial Lower Leg: Cleanse wound with mild soap and water May Shower, gently pat wound dry prior to applying new dressing. Anesthetic: Wound #2 Left,Medial Lower Leg: Topical Lidocaine 4% cream applied to wound bed prior to [name] - for office use only Primary Wound Dressing: Wound #2 Left,Medial Lower Leg: Prisma Ag Secondary Dressing: Wound #2 Left,Medial Lower Leg: Conform/Kerlix - netting Bowland, Elainah T. ([id number]) Dressing Change Frequency: Wound #2 Left,Medial Lower  Leg: Change dressing every other day. Follow-up Appointments: Wound #2 Left,Medial Lower Leg: Return Appointment in 1 week. Edema Control: Wound #2 Left,Medial Lower Leg: Elevate legs to the level of the heart and pump ankles as often as possible Additional Orders / Instructions: Wound #2 Left,Medial Lower Leg: Increase protein intake. Medications-please add to medication list.: Wound #2 Left,Medial Lower Leg: Other: - Vitamin C, Zinc, Vitamin A, Multivitamin I have recommended we continue with: 1. Prisma Ag to the left lower extremity and a light Kerlix bandage to keep in place as the skin is very fragile due to being on steroids long-term. 2. I also recommended compression stockings of the 20-30 mm variety to be wound all day to reduce the lymphedema. 3. Elevation and exercise. 4. regular visits to the wound center. she and her daughter have had all QUESTIONS answered Electronic Signature(s) Signed: 06/23/2015 2:16:12 PM By: Evlyn Kanner MD, FACS Previous Signature: 06/23/2015 2:14:44 PM Version By: Evlyn Kanner MD, FACS Entered By: Evlyn Kanner on 06/23/2015 14:16:11 Heims, Blia T. (  [id number]) -------------------------------------------------------------------------------- SuperBill Details Patient Name: Dougan, Alashia T. Date of Service: 06/23/2015 Medical Record Number: [id number] Patient Account Number: [id number] Date of Birth/Sex: January 28, 1928 (82 y.o. Female) Treating RN: Huel Coventry Primary Care Physician: Herminio Commons Other Clinician: Referring Physician: Herminio Commons Treating Physician/Extender: Rudene Re in Treatment: 4 Diagnosis Coding ICD-10 Codes Code Description [phone] Non-pressure chronic ulcer of left calf with fat layer exposed L55.1 Sunburn of second degree L97.511 Non-pressure chronic ulcer of other part of right foot limited to breakdown of skin L97.521 Non-pressure chronic ulcer of other part of left foot limited to breakdown of skin I89.0  Lymphedema, not elsewhere classified Facility Procedures CPT4: Description Modifier Quantity Code [id number] 11042 - DEB SUBQ TISSUE 20 SQ CM/< 1 ICD-10 Description Diagnosis L97.222 Non-pressure chronic ulcer of left calf with fat layer exposed I89.0 Lymphedema, not elsewhere classified L97.511 Non-pressure  chronic ulcer of other part of right foot limited to breakdown of skin Physician Procedures CPT4: Description Modifier Quantity Code [id number] 11042 - WC PHYS SUBQ TISS 20 SQ CM 1 ICD-10 Description Diagnosis L97.222 Non-pressure chronic ulcer of left calf with fat layer exposed I89.0 Lymphedema, not elsewhere classified L97.511 Non-pressure  chronic ulcer of other part of right foot limited to breakdown of skin Electronic Signature(s) Signed: 06/23/2015 2:15:22 PM By: Evlyn Kanner MD, FACS Entered By: Evlyn Kanner on 06/23/2015 14:15:21
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
## 2015-06-28 DIAGNOSIS — M818 Other osteoporosis without current pathological fracture: Secondary | ICD-10-CM | POA: Diagnosis not present

## 2015-06-28 DIAGNOSIS — M353 Polymyalgia rheumatica: Secondary | ICD-10-CM | POA: Diagnosis not present

## 2015-06-28 DIAGNOSIS — Z79899 Other long term (current) drug therapy: Secondary | ICD-10-CM | POA: Diagnosis not present

## 2015-06-30 ENCOUNTER — Encounter: Payer: Medicare Other | Admitting: Surgery

## 2015-06-30 DIAGNOSIS — D510 Vitamin B12 deficiency anemia due to intrinsic factor deficiency: Secondary | ICD-10-CM | POA: Diagnosis not present

## 2015-06-30 DIAGNOSIS — L97511 Non-pressure chronic ulcer of other part of right foot limited to breakdown of skin: Secondary | ICD-10-CM | POA: Diagnosis not present

## 2015-06-30 DIAGNOSIS — L97222 Non-pressure chronic ulcer of left calf with fat layer exposed: Secondary | ICD-10-CM | POA: Diagnosis not present

## 2015-06-30 DIAGNOSIS — I10 Essential (primary) hypertension: Secondary | ICD-10-CM | POA: Diagnosis not present

## 2015-06-30 DIAGNOSIS — M810 Age-related osteoporosis without current pathological fracture: Secondary | ICD-10-CM | POA: Diagnosis not present

## 2015-06-30 DIAGNOSIS — S81802A Unspecified open wound, left lower leg, initial encounter: Secondary | ICD-10-CM | POA: Diagnosis not present

## 2015-06-30 DIAGNOSIS — I890 Lymphedema, not elsewhere classified: Secondary | ICD-10-CM | POA: Diagnosis not present

## 2015-07-02 NOTE — Progress Notes (Signed)
[name], [name] ([id number]) Visit Report for 06/30/2015 Chief Complaint Document Details [name], [facility] 06/30/2015 10:45 Patient Name: Date of Service: [name]. AM Medical Record Patient Account Number: [id number] Number: Treating RN: [name] Date of Birth/Sex: 08/02/1928 (82 y.o. Female) Other Clinician: Primary Care Physician: [name] Treating [name] Referring Physician: Herminio [name] Physician/Extender: [name] in Treatment: 5 Information Obtained from: Patient Chief Complaint Patient seen for complaints of Non-Healing Wound to the left lower extremity for about 2 months and to the toes on both feet for about 2 weeks Electronic Signature(s) Signed: 06/30/2015 11:30:14 AM By: [name] Kanner MD, [name] Entered By: [name] on 06/30/2015 11:30:14 [name], [name] [name]. ([id number]) -------------------------------------------------------------------------------- Debridement Details [name] 06/30/2015 10:45 Patient Name: Date of Service: [name]. AM Medical Record Patient Account Number: [id number] Number: Treating RN: [name] Date of Birth/Sex: 01/12/1929 (82 y.o. Female) Other Clinician: Primary Care Physician: [name] Treating [name] Referring Physician: Herminio [name] Physician/Extender: [name] in Treatment: 5 Debridement Performed for Wound #2 Left,Medial Lower Leg Assessment: Performed By: Physician [name] Kanner, MD Debridement: Debridement Pre-procedure Yes Verification/Time Out Taken: Start Time: 11:25 Pain Control: Other : lidocaine 4% Level: Skin/Subcutaneous Tissue Total Area Debrided (L x 0.7 (cm) x 0.6 (cm) = 0.42 (cm) W): Tissue and other Viable, Non-Viable, Exudate, Fibrin/Slough, Subcutaneous material debrided: Instrument: Curette Bleeding: Minimum Hemostasis Achieved: Pressure End Time: 11:28 Procedural Pain: 0 Post Procedural Pain: 1 Response to Treatment: Procedure was tolerated well Post Debridement  Measurements of Total Wound Length: (cm) 0.7 Width: (cm) 0.6 Depth: (cm) 0.2 Volume: (cm) 0.066 Post Procedure Diagnosis Same as Pre-procedure Electronic Signature(s) Signed: 06/30/2015 11:30:08 AM By: [name] Kanner MD, [name] Signed: 07/01/2015 4:33:28 PM By: [name] Gurney RN, [name], [name] Entered By: [name] on 06/30/2015 11:30:08 [name], [name] [name]. ([id number]) -------------------------------------------------------------------------------- HPI Details [name], [name] 06/30/2015 10:45 Patient Name: Date of Service: [name]. AM Medical Record Patient Account Number: [id number] Number: Treating RN: [name] Date of Birth/Sex: 1928/03/30 (82 y.o. Female) Other Clinician: Primary Care Physician: [name] Treating [name] Referring Physician: Herminio [name] Physician/Extender: [name] in Treatment: 5 History of Present Illness Location: left lower extremity and both right and left big toes Quality: Patient reports experiencing a dull pain to affected area(s). Severity: Patient states wound are getting worse. Duration: Patient has had the wound for > 2 months prior to seeking treatment at the wound center Timing: Pain in wound is Intermittent (comes and goes Context: The wound occurred when the patient had a lacerated wound to her left lateral calf about 2 months ago with a blunt injury and then sunburnt her feet about 2 weeks ago Modifying Factors: Other treatment(s) tried include:antibiotics and local care Associated Signs and Symptoms: Patient reports having increase swelling. HPI Description: 82 year old patient with a past medical history of essential hypertension, osteoporosis, vitamin D deficiency, pernicious anemia. she was recently seen by her PCP Dr. Herold [name] for a wound on the left leg with cellulitis of the leg and foot. she is also status post appendectomy, left hip arthroplasty.She has been treated with several courses of antibiotics and wound care and was  improving but then went and had a sunburn to both feet and got infected and had another course of Bactrim and Keflex. she had a blister on the right big toe that was red and inflamed. she is also had some recent x-rays had an x-ray of the left ankle which showed no acute osseous findings are also had x-ray of the left tibia and  fibula which was a normal tibia and fibula. on 05/03/2015 she had a venous duplex study of her left lower extremity which showed no evidence of deep or superficial venous thrombosis or incompetence. She is a former smoker and quit in 2008 June Electronic Signature(s) Signed: 06/30/2015 11:30:17 AM By: [name] KannerBritto, [name] Entered By: [name] KannerBritto, [name] on 06/30/2015 11:30:17 [name], [name] [name]. ([id number]) -------------------------------------------------------------------------------- Physical Exam Details [name], [name] 06/30/2015 10:45 Patient Name: Date of Service: [name]. AM Medical Record Patient Account Number: [id number] Number: Treating RN: [name] CoventryWoody, [name] Date of Birth/Sex: 21-Dec-1928 (82 y.o. Female) Other Clinician: Primary Care Physician: [name] CommonsKIM, [name] Treating [name] KannerBritto, [name] Mashaw Referring Physician: Herminio CommonsKIM, [name] Physician/Extender: Weeks in Treatment: 5 Constitutional . Pulse regular. Respirations normal and unlabored. Afebrile. . Eyes Nonicteric. Reactive to light. Ears, Nose, Mouth, and Throat Lips, teeth, and gums WNL.Marland Kitchen. Moist mucosa without lesions. Neck supple and nontender. No palpable supraclavicular or cervical adenopathy. Normal sized without goiter. Respiratory WNL. No retractions.. Breath sounds WNL, No rubs, rales, rhonchi, or wheeze.. Cardiovascular Heart rhythm and rate regular, no murmur or gallop.. Pedal Pulses WNL. No clubbing, cyanosis or edema. Lymphatic No adneopathy. No adenopathy. No adenopathy. Musculoskeletal Adexa without tenderness or enlargement.. Digits and nails w/o clubbing, cyanosis, infection, petechiae, ischemia, or  inflammatory conditions.. Integumentary (Hair, Skin) No suspicious lesions. No crepitus or fluctuance. No peri-wound warmth or erythema. No masses.Marland Kitchen. Psychiatric Judgement and insight Intact.. No evidence of depression, anxiety, or agitation.. Notes the wound has some subcutaneous debris which I removed with the #3 curet and after sharply removing this reading was controlled with pressure. Under that she has healthy granulation tissue. Electronic Signature(s) Signed: 06/30/2015 11:30:47 AM By: [name] KannerBritto, Edson Deridder MD, [name] Entered By: [name] KannerBritto, Domingo Fuson on 06/30/2015 11:30:46 [name], [name] BargeEBECCA [name]. ([id number]) -------------------------------------------------------------------------------- Physician Orders Details [name], [name] 06/30/2015 10:45 Patient Name: Date of Service: [name]. AM Medical Record Patient Account Number: [id number] Number: Treating RN: [name] CoventryWoody, [name] Date of Birth/Sex: 21-Dec-1928 (82 y.o. Female) Other Clinician: Primary Care Physician: [name] CommonsKIM, [name] Treating [name] KannerBritto, Jordyne Poehlman Referring Physician: Herminio CommonsKIM, [name] Physician/Extender: [name] AdeWeeks in Treatment: 5 Verbal / Phone Orders: Yes Clinician: Huel CoventryWoody, [name] Read Back and Verified: Yes Diagnosis Coding Wound Cleansing Wound #2 Left,Medial Lower Leg o Cleanse wound with mild soap and water o May Shower, gently pat wound dry prior to applying new dressing. Anesthetic Wound #2 Left,Medial Lower Leg o Topical Lidocaine 4% cream applied to wound bed prior to debridement - for office use only Primary Wound Dressing Wound #2 Left,Medial Lower Leg o Prisma Ag Secondary Dressing Wound #2 Left,Medial Lower Leg o Conform/Kerlix - netting Dressing Change Frequency Wound #2 Left,Medial Lower Leg o Change dressing every other day. Follow-up Appointments Wound #2 Left,Medial Lower Leg o Return Appointment in 1 week. Edema Control Wound #2 Left,Medial Lower Leg o Elevate legs to the level of the heart and pump ankles as often as  possible Additional Orders / Instructions Wound #2 Left,Medial Lower Leg o Increase protein intake. [name], [name] [name]. ([id number]) Medications-please add to medication list. Wound #2 Left,Medial Lower Leg o Other: - Vitamin C, Zinc, Vitamin A, Multivitamin Electronic Signature(s) Signed: 06/30/2015 3:49:49 PM By: [name] KannerBritto, Saharsh Sterling MD, [name] Signed: 07/01/2015 4:33:28 PM By: [name] GurneyWoody, RN, [name], [name] Entered By: [name] GurneyWoody, RN, [name], [name] on 06/30/2015 11:29:34 [name], [name] [name]. ([id number]) -------------------------------------------------------------------------------- Problem List Details [name], [name] 06/30/2015 10:45 Patient Name: Date of Service: [name]. AM Medical Record Patient Account Number: [id number] Number: Treating RN: [name] CoventryWoody, [name] Date of Birth/Sex: 21-Dec-1928 (82 y.o. Female) Other Clinician: Primary Care  Physician: [name] Treating [name] Referring Physician: Herminio [name] Physician/Extender: [name] in Treatment: 5 Active Problems ICD-10 Encounter Code Description Active Date Diagnosis L97.222 Non-pressure chronic ulcer of left calf with fat layer 05/26/2015 Yes exposed L97.511 Non-pressure chronic ulcer of other part of right foot 05/26/2015 Yes limited to breakdown of skin I89.0 Lymphedema, not elsewhere classified 06/13/2015 Yes Inactive Problems Resolved Problems ICD-10 Code Description Active Date Resolved Date L55.1 Sunburn of second degree 05/26/2015 05/26/2015 L97.521 Non-pressure chronic ulcer of other part of left foot limited 05/26/2015 05/26/2015 to breakdown of skin Electronic Signature(s) Signed: 06/30/2015 11:29:57 AM By: [name] Kanner MD, [name] Entered By: [name] on 06/30/2015 11:29:57 [name], [name] [name]. ([id number]) -------------------------------------------------------------------------------- Progress Note Details [name], [name] 06/30/2015 10:45 Patient Name: Date of Service: [name]. AM Medical Record Patient Account Number:  [id number] Number: Treating RN: [name] Date of Birth/Sex: Sep 12, 1928 (82 y.o. Female) Other Clinician: Primary Care Physician: [name] Treating Ndeye Tenorio Referring Physician: Herminio [name] Physician/Extender: [name] in Treatment: 5 Subjective Chief Complaint Information obtained from Patient Patient seen for complaints of Non-Healing Wound to the left lower extremity for about 2 months and to the toes on both feet for about 2 weeks History of Present Illness (HPI) The following HPI elements were documented for the patient's wound: Location: left lower extremity and both right and left big toes Quality: Patient reports experiencing a dull pain to affected area(s). Severity: Patient states wound are getting worse. Duration: Patient has had the wound for > 2 months prior to seeking treatment at the wound center Timing: Pain in wound is Intermittent (comes and goes Context: The wound occurred when the patient had a lacerated wound to her left lateral calf about 2 months ago with a blunt injury and then sunburnt her feet about 2 weeks ago Modifying Factors: Other treatment(s) tried include:antibiotics and local care Associated Signs and Symptoms: Patient reports having increase swelling. 82 year old patient with a past medical history of essential hypertension, osteoporosis, vitamin D deficiency, pernicious anemia. she was recently seen by her PCP Dr. Herold [name] for a wound on the left leg with cellulitis of the leg and foot. she is also status post appendectomy, left hip arthroplasty.She has been treated with several courses of antibiotics and wound care and was improving but then went and had a sunburn to both feet and got infected and had another course of Bactrim and Keflex. she had a blister on the right big toe that was red and inflamed. she is also had some recent x-rays had an x-ray of the left ankle which showed no acute osseous findings are also had x-ray of the left  tibia and fibula which was a normal tibia and fibula. on 05/03/2015 she had a venous duplex study of her left lower extremity which showed no evidence of deep or superficial venous thrombosis or incompetence. She is a former smoker and quit in 2008 June Objective [place], [place] [name]. ([id number]) Constitutional Pulse regular. Respirations normal and unlabored. Afebrile. Vitals Time Taken: 11:07 AM, Height: 61 in, Weight: 108 lbs, BMI: 20.4, Temperature: 97.4 F, Pulse: 63 bpm, Respiratory Rate: 18 breaths/min, Blood Pressure: 147/46 mmHg. Eyes Nonicteric. Reactive to light. Ears, Nose, Mouth, and Throat Lips, teeth, and gums WNL.Marland Kitchen Moist mucosa without lesions. Neck supple and nontender. No palpable supraclavicular or cervical adenopathy. Normal sized without goiter. Respiratory WNL. No retractions.. Breath sounds WNL, No rubs, rales, rhonchi, or wheeze.. Cardiovascular Heart rhythm and rate regular, no murmur or gallop.. Pedal Pulses WNL. No clubbing, cyanosis  or edema. Lymphatic No adneopathy. No adenopathy. No adenopathy. Musculoskeletal Adexa without tenderness or enlargement.. Digits and nails w/o clubbing, cyanosis, infection, petechiae, ischemia, or inflammatory conditions.Marland Kitchen Psychiatric Judgement and insight Intact.. No evidence of depression, anxiety, or agitation.. General Notes: the wound has some subcutaneous debris which I removed with the #3 curet and after sharply removing this reading was controlled with pressure. Under that she has healthy granulation tissue. Integumentary (Hair, Skin) No suspicious lesions. No crepitus or fluctuance. No peri-wound warmth or erythema. No masses.. Wound #2 status is Open. Original cause of wound was Trauma. The wound is located on the Left,Medial Lower Leg. The wound measures 0.7cm length x 0.6cm width x 0.2cm depth; 0.33cm^2 area and 0.066cm^3 volume. The wound is limited to skin breakdown. There is no tunneling or undermining  noted. There is a small amount of purulent drainage noted. The wound margin is thickened. There is medium (34- 66%) granulation within the wound bed. There is a medium (34-66%) amount of necrotic tissue within the wound bed including Adherent Slough. The periwound skin appearance exhibited: Localized Edema, Dry/Scaly, Moist, Erythema. The surrounding wound skin color is noted with erythema which is circumferential. Periwound temperature was noted as No Abnormality. The periwound has tenderness on palpation. Haisley, Obie [name]. ([id number]) Assessment Active Problems ICD-10 [phone] - Non-pressure chronic ulcer of left calf with fat layer exposed L97.511 - Non-pressure chronic ulcer of other part of right foot limited to breakdown of skin I89.0 - Lymphedema, not elsewhere classified Procedures Wound #2 Wound #2 is a Trauma, Other located on the Left,Medial Lower Leg . There was a Skin/Subcutaneous Tissue Debridement ([id number]) debridement with total area of 0.42 sq cm performed by [name] Kanner, MD. with the following instrument(s): Curette to remove Viable and Non-Viable tissue/material including Exudate, Fibrin/Slough, and Subcutaneous after achieving pain control using Other (lidocaine 4%). A time out was conducted prior to the start of the procedure. A Minimum amount of bleeding was controlled with Pressure. The procedure was tolerated well with a pain level of 0 throughout and a pain level of 1 following the procedure. Post Debridement Measurements: 0.7cm length x 0.6cm width x 0.2cm depth; 0.066cm^3 volume. Post procedure Diagnosis Wound #2: Same as Pre-Procedure Plan Wound Cleansing: Wound #2 Left,Medial Lower Leg: Cleanse wound with mild soap and water May Shower, gently pat wound dry prior to applying new dressing. Anesthetic: Wound #2 Left,Medial Lower Leg: Topical Lidocaine 4% cream applied to wound bed prior to debridement - for office use only Primary Wound  Dressing: Wound #2 Left,Medial Lower Leg: Prisma Ag Secondary Dressing: Wound #2 Left,Medial Lower Leg: Conform/Kerlix - netting [place], [place] [name]. ([id number]) Dressing Change Frequency: Wound #2 Left,Medial Lower Leg: Change dressing every other day. Follow-up Appointments: Wound #2 Left,Medial Lower Leg: Return Appointment in 1 week. Edema Control: Wound #2 Left,Medial Lower Leg: Elevate legs to the level of the heart and pump ankles as often as possible Additional Orders / Instructions: Wound #2 Left,Medial Lower Leg: Increase protein intake. Medications-please add to medication list.: Wound #2 Left,Medial Lower Leg: Other: - Vitamin C, Zinc, Vitamin A, Multivitamin I have recommended we continue with: 1. Prisma Ag to the left lower extremity and a light Kerlix bandage to keep in place as the skin is very fragile due to being on steroids long-term. 2. I also recommended compression stockings of the 20-30 mm variety to be wound all day to reduce the lymphedema. 3. Elevation and exercise. 4. regular visits to the wound center. she and her daughter have  had all QUESTIONS answered Electronic Signature(s) Signed: 06/30/2015 11:31:02 AM By: [name] Kanner MD, [name] Entered By: [name] on 06/30/2015 11:31:02 Laser, Jerilynn [name]. ([id number]) -------------------------------------------------------------------------------- SuperBill Details Patient Name: Elting, Delinda [name]. Date of Service: 06/30/2015 Medical Record Number: [id number] Patient Account Number: [id number] Date of Birth/Sex: 12-30-1928 (82 y.o. Female) Treating RN: [name] Primary Care Physician: [name] Other Clinician: Referring Physician: Herminio [name] Treating Physician/Extender: Rudene Re in Treatment: 5 Diagnosis Coding ICD-10 Codes Code Description [phone] Non-pressure chronic ulcer of left calf with fat layer exposed L97.511 Non-pressure chronic ulcer of other part of right foot limited to  breakdown of skin I89.0 Lymphedema, not elsewhere classified Facility Procedures CPT4: Description Modifier Quantity Code [id number] 11042 - DEB SUBQ TISSUE 20 SQ CM/< 1 ICD-10 Description Diagnosis L97.222 Non-pressure chronic ulcer of left calf with fat layer exposed L97.511 Non-pressure chronic ulcer of other part of right foot  limited to breakdown of skin I89.0 Lymphedema, not elsewhere classified Physician Procedures CPT4: Description Modifier Quantity Code [id number] 11042 - WC PHYS SUBQ TISS 20 SQ CM 1 ICD-10 Description Diagnosis L97.222 Non-pressure chronic ulcer of left calf with fat layer exposed L97.511 Non-pressure chronic ulcer of other part of right foot  limited to breakdown of skin I89.0 Lymphedema, not elsewhere classified Electronic Signature(s) Signed: 06/30/2015 11:31:12 AM By: [name] Kanner MD, [name] Entered By: [name] on 06/30/2015 11:31:12

## 2015-07-02 NOTE — Progress Notes (Signed)
[name] ([id number]) Visit Report for 06/30/2015 Arrival Information Details [name], [name] 06/30/2015 10:45 Patient Name: Date of Service: [name]. AM Medical Record Patient Account Number: [id number] Number: Treating RN: Huel Coventry Date of Birth/Sex: 12-30-1928 (82 y.o. Female) Other Clinician: Primary Care Physician: Herminio Commons Treating Britto, Errol Referring Physician: Herminio Commons Physician/Extender: Tania Ade in Treatment: 5 Visit Information History Since Last Visit Added or deleted any medications: No Patient Arrived: Cane Any new allergies or adverse reactions: No Arrival Time: 11:04 Had a fall or experienced change in No Accompanied By: daughter activities of daily living that may affect Transfer Assistance: None risk of falls: Patient Identification Verified: Yes Signs or symptoms of abuse/neglect since last No Secondary Verification Process Yes visito Completed: Hospitalized since last visit: No Patient Requires Transmission-Based No Pain Present Now: No Precautions: Patient Has Alerts: No Electronic Signature(s) Signed: 07/01/2015 4:33:28 PM By: Elliot Gurney, RN, [name], [name] Entered By: Elliot Gurney, RN, [name], [name] on 06/30/2015 11:04:45 [name], Skylah TMarland Kitchen ([id number]) -------------------------------------------------------------------------------- Encounter Discharge Information Details [name] 06/30/2015 10:45 Patient Name: Date of Service: [name]. AM Medical Record Patient Account Number: [id number] Number: Treating RN: Huel Coventry Date of Birth/Sex: 09-12-1928 (82 y.o. Female) Other Clinician: Primary Care Physician: Herminio Commons Treating Evlyn Kanner Referring Physician: Herminio Commons Physician/Extender: Tania Ade in Treatment: 5 Encounter Discharge Information Items Discharge Pain Level: 0 Discharge Condition: Stable Ambulatory Status: Cane Discharge Destination: Home Transportation: Private Auto Accompanied By: daughter Schedule  Follow-up Appointment: Yes Medication Reconciliation completed Yes and provided to Patient/Care Omar Gayden: Provided on Clinical Summary of Care: 06/30/2015 Form Type Recipient Paper Patient RE Electronic Signature(s) Signed: 07/01/2015 4:33:28 PM By: Elliot Gurney RN, [name], [name] Previous Signature: 06/30/2015 11:35:51 AM Version By: Gwenlyn Perking Entered By: Elliot Gurney RN, [name], [name] on 06/30/2015 11:37:32 [name], Cythnia [name]. ([id number]) -------------------------------------------------------------------------------- Lower Extremity Assessment Details Shimada, Rosey 06/30/2015 10:45 Patient Name: Date of Service: [name]. AM Medical Record Patient Account Number: [id number] Number: Treating RN: Huel Coventry Date of Birth/Sex: [birth year] (82 y.o. Female) Other Clinician: Primary Care Physician: Herminio Commons Treating Britto, Errol Referring Physician: Herminio Commons Physician/Extender: Tania Ade in Treatment: 5 Vascular Assessment Pulses: Posterior Tibial Dorsalis Pedis Palpable: [Left:Yes] Extremity colors, hair growth, and conditions: Extremity Color: [Left:Dusky] Hair Growth on Extremity: [Left:No] Temperature of Extremity: [Left:Cool] Capillary Refill: [Left:> 3 seconds] Toe Nail Assessment Left: Right: Thick: No Discolored: No Deformed: No Improper Length and Hygiene: No Electronic Signature(s) Signed: 07/01/2015 4:33:28 PM By: Elliot Gurney, RN, [name], [name] Entered By: Elliot Gurney, RN, [name], [name] on 06/30/2015 11:11:21 [name], Brezlyn [name]. ([id number]) -------------------------------------------------------------------------------- Multi Wound Chart Details [name] 06/30/2015 10:45 Patient Name: Date of Service: [name]. AM Medical Record Patient Account Number: [id number] Number: Treating RN: Huel Coventry Date of Birth/Sex: 02-Mar-1928 (82 y.o. Female) Other Clinician: Primary Care Physician: Herminio Commons Treating Evlyn Kanner Referring Physician: Herminio Commons Physician/Extender: Tania Ade in Treatment: 5 Vital Signs Height(in): 61 Pulse(bpm): 63 Weight(lbs): 108 Blood Pressure 147/46 (mmHg): Body Mass Index(BMI): 20 Temperature(F): 97.4 Respiratory Rate 18 (breaths/min): Photos: [2:No Photos] [N/A:N/A] Wound Location: [2:Left Lower Leg - Medial] [N/A:N/A] Wounding Event: [2:Trauma] [N/A:N/A] Primary Etiology: [2:Trauma, Other] [N/A:N/A] Comorbid History: [2:Cataracts, Anemia, Hypertension, History of Burn, Gout, Osteoarthritis] [N/A:N/A] Date Acquired: [2:04/06/2015] [N/A:N/A] Weeks of Treatment: [2:5] [N/A:N/A] Wound Status: [2:Open] [N/A:N/A] Measurements L x W x D 0.7x0.6x0.2 [N/A:N/A] (cm) Area (cm) : [2:0.33] [N/A:N/A] Volume (cm) : [2:0.066] [N/A:N/A] % Reduction in Area: [2:95.00%] [N/A:N/A] % Reduction in Volume: 90.00% [N/A:N/A] Classification: [2:Full Thickness Without Exposed Support Structures] [N/A:N/A] Exudate  Amount: [2:Small] [N/A:N/A] Exudate Type: [2:Purulent] [N/A:N/A] Exudate Color: [2:yellow, brown, green] [N/A:N/A] Wound Margin: [2:Thickened] [N/A:N/A] Granulation Amount: [2:Medium (34-66%)] [N/A:N/A] Necrotic Amount: [2:Medium (34-66%)] [N/A:N/A] Exposed Structures: [2:Fascia: No Fat: No Tendon: No] [N/A:N/A] Muscle: No Joint: No Bone: No Limited to Skin Breakdown Epithelialization: Small (1-33%) N/A N/A Periwound Skin Texture: Edema: Yes N/A N/A Periwound Skin Moist: Yes N/A N/A Moisture: Dry/Scaly: Yes Periwound Skin Color: Erythema: Yes N/A N/A Erythema Location: Circumferential N/A N/A Temperature: No Abnormality N/A N/A Tenderness on Yes N/A N/A Palpation: Wound Preparation: Ulcer Cleansing: N/A N/A Rinsed/Irrigated with Saline Topical Anesthetic Applied: Other: lidocaine 4% Treatment Notes Electronic Signature(s) Signed: 07/01/2015 4:33:28 PM By: Elliot GurneyWoody, RN, [name], [name] Entered By: Elliot GurneyWoody, RN, [name], [name] on 06/30/2015 11:16:03 [name] Marland Kitchen.  ([id number]) -------------------------------------------------------------------------------- Multi-Disciplinary Care Plan Details [name] 10:45 Patient Name: Date of Service: [name]. AM Medical Record Patient Account Number: [id number] Number: Treating RN: Huel CoventryWoody, [name] Date of Birth/Sex: [birth year] (82 y.o. Female) Other Clinician: Primary Care Physician: Herminio CommonsKIM, [name] Treating Evlyn KannerBritto, Errol Referring Physician: Herminio CommonsKIM, [name] Physician/Extender: Tania AdeWeeks in Treatment: 5 Active Inactive Abuse / Safety / Falls / Self Care Management Nursing Diagnoses: Impaired physical mobility Potential for falls Self care deficit: actual or potential Goals: Patient will remain injury free Date Initiated: 05/26/2015 Goal Status: Active Patient/caregiver will verbalize understanding of [name] importance to maintain current immunizations/vaccinations Date Initiated: 05/26/2015 Goal Status: Active Patient/caregiver will verbalize/demonstrate measure taken to improve self care Date Initiated: 05/26/2015 Goal Status: Active Patient/caregiver will verbalize/demonstrate measures taken to improve [name] patient's personal safety Date Initiated: 05/26/2015 Goal Status: Active Patient/caregiver will verbalize/demonstrate measures taken to prevent injury and/or falls Date Initiated: 05/26/2015 Goal Status: Active Patient/caregiver will verbalize/demonstrate understanding of what to do in case of emergency Date Initiated: 05/26/2015 Goal Status: Active Interventions: Assess fall risk on admission and as needed Assess: immobility, friction, shearing, incontinence upon admission and as needed Assess impairment of mobility on admission and as needed per policy Assess self care needs on admission and as needed Sc[place]lebury, Tamatha [name]. ([id number]) Provide education on basic hygiene Provide education on fall prevention Provide education on personal and home safety Provide education on safe  transfers Notes: Orientation to [name] Nursing Diagnoses: Knowledge deficit related to [name] program Goals: Patient/caregiver will verbalize understanding of [name] Date Initiated: 05/26/2015 Goal Status: Active Interventions: Provide education on orientation to [name] wound [facility] Notes: Venous Leg Ulcer Nursing Diagnoses: Knowledge deficit related to disease process and management Potential for venous Insuffiency (use before diagnosis confirmed) Goals: Patient will maintain optimal edema control Date Initiated: 05/26/2015 Goal Status: Active Patient/caregiver will verbalize understanding of disease process and disease management Date Initiated: 05/26/2015 Goal Status: Active Verify adequate tissue perfusion prior to therapeutic compression application Date Initiated: 05/26/2015 Goal Status: Active Interventions: Assess peripheral edema status every visit. Provide education on venous insufficiency Notes: Wound/Skin Impairment Mickley, Donnia [name]. ([id number]) Nursing Diagnoses: Impaired tissue integrity Knowledge deficit related to smoking impact on wound healing Knowledge deficit related to ulceration/compromised skin integrity Goals: Patient/caregiver will verbalize understanding of skin care regimen Date Initiated: 05/26/2015 Goal Status: Active Ulcer/skin breakdown will have a volume reduction of 30% by week 4 Date Initiated: 05/26/2015 Goal Status: Active Ulcer/skin breakdown will have a volume reduction of 50% by week 8 Date Initiated: 05/26/2015 Goal Status: Active Ulcer/skin breakdown will have a volume reduction of 80% by week 12 Date Initiated: 05/26/2015 Goal Status: Active Ulcer/skin breakdown will heal within 14 weeks  Date Initiated: 05/26/2015 Goal Status: Active Interventions: Assess patient/caregiver ability to obtain necessary supplies Assess patient/caregiver ability to perform ulcer/skin care regimen  upon admission and as needed Assess ulceration(s) every visit Provide education on ulcer and skin care Treatment Activities: Referred to DME Faline Langer for dressing supplies : 05/26/2015 Skin care regimen initiated : 05/26/2015 Topical wound management initiated : 05/26/2015 Notes: Electronic Signature(s) Signed: 07/01/2015 4:33:28 PM By: Elliot Gurney, RN, [name], [name] Entered By: Elliot Gurney, RN, [name], [name] on 06/30/2015 11:15:56 Gandolfo, Dareen [name]. ([id number]) -------------------------------------------------------------------------------- Pain Assessment Details Mitchener, Isebella 06/30/2015 10:45 Patient Name: Date of Service: [name]. AM Medical Record Patient Account Number: [id number] Number: Treating RN: Huel Coventry Date of Birth/Sex: 1928/08/11 (82 y.o. Female) Other Clinician: Primary Care Physician: Herminio Commons Treating Evlyn Kanner Referring Physician: Herminio Commons Physician/Extender: Tania Ade in Treatment: 5 Active Problems Location of Pain Severity and Description of Pain Patient Has Paino No Site Locations With Dressing Change: No Pain Management and Medication Current Pain Management: Electronic Signature(s) Signed: 07/01/2015 4:33:28 PM By: Elliot Gurney, RN, [name], [name] Entered By: Elliot Gurney, RN, [name], [name] on 06/30/2015 11:05:04 Battin, Takeia TMarland Kitchen ([id number]) -------------------------------------------------------------------------------- Patient/Caregiver Education Details Bisson, Avika 06/30/2015 10:45 Patient Name: Date of Service: [name]. AM Medical Record Patient Account Number: [id number] Number: Treating RN: Huel Coventry Date of Birth/Gender: 22-Mar-1928 (82 y.o. Female) Other Clinician: Primary Care Physician: Herminio Commons Treating Evlyn Kanner Referring Physician: Herminio Commons Physician/Extender: Tania Ade in Treatment: 5 Education Assessment Education Provided To: Patient Education Topics Provided Wound/Skin Impairment: Handouts: Caring for Your Ulcer, Other: continue  wound care as prescribed Methods: Demonstration, Explain/Verbal Electronic Signature(s) Signed: 07/01/2015 4:33:28 PM By: Elliot Gurney, RN, [name], [name] Entered By: Elliot Gurney, RN, [name], [name] on 06/30/2015 11:37:56 Walski, Kenli [name]. ([id number]) -------------------------------------------------------------------------------- Wound Assessment Details Wesche, Magda 06/30/2015 10:45 Patient Name: Date of Service: [name]. AM Medical Record Patient Account Number: [id number] Number: Treating RN: Huel Coventry Date of Birth/Sex: 1928-07-10 (82 y.o. Female) Other Clinician: Primary Care Physician: Herminio Commons Treating Britto, Errol Referring Physician: Herminio Commons Physician/Extender: Tania Ade in Treatment: 5 Wound Status Wound Number: 2 Primary Trauma, Other Etiology: Wound Location: Left Lower Leg - Medial Wound Open Wounding Event: Trauma Status: Date Acquired: 04/06/2015 Comorbid Cataracts, Anemia, Hypertension, Weeks Of Treatment: 5 History: History of Burn, Gout, Osteoarthritis Clustered Wound: No Photos Photo Uploaded By: Elliot Gurney, RN, [name], [name] on 06/30/2015 15:36:03 Wound Measurements Length: (cm) 0.7 Width: (cm) 0.6 Depth: (cm) 0.2 Area: (cm) 0.33 Volume: (cm) 0.066 % Reduction in Area: 95% % Reduction in Volume: 90% Epithelialization: Small (1-33%) Tunneling: No Undermining: No Wound Description Full Thickness Without Exposed Classification: Support Structures Wound Margin: Thickened Exudate Small Amount: Exudate Type: Purulent Exudate Color: yellow, brown, green Foul Odor After Cleansing: No Wound Bed Granulation Amount: Medium (34-66%) Exposed Structure Morelos, Elenor [name]. ([id number]) Necrotic Amount: Medium (34-66%) Fascia Exposed: No Necrotic Quality: Adherent Slough Fat Layer Exposed: No Tendon Exposed: No Muscle Exposed: No Joint Exposed: No Bone Exposed: No Limited to Skin Breakdown Periwound Skin Texture Texture Color No Abnormalities Noted:  No No Abnormalities Noted: No Localized Edema: Yes Erythema: Yes Erythema Location: Circumferential Moisture No Abnormalities Noted: No Temperature / Pain Dry / Scaly: Yes Temperature: No Abnormality Moist: Yes Tenderness on Palpation: Yes Wound Preparation Ulcer Cleansing: Rinsed/Irrigated with Saline Topical Anesthetic Applied: Other: lidocaine 4%, Treatment Notes Wound #2 (Left, Medial Lower Leg) 1. Cleansed with: Clean wound with Normal Saline 2. Anesthetic Topical Lidocaine 4% cream to wound bed prior to debridement 4. Dressing Applied: Prisma Ag  5. Secondary Dressing Applied Kerlix/Conform Notes netting Electronic Signature(s) Signed: 07/01/2015 4:33:28 PM By: Elliot Gurney, RN, [name], [name] Entered By: Elliot Gurney, RN, [name], [name] on 06/30/2015 11:15:08 Pellot, Kristiane TMarland Kitchen ([id number]) -------------------------------------------------------------------------------- Vitals Details Spallone, Yaretzy 06/30/2015 10:45 Patient Name: Date of Service: [name]. AM Medical Record Patient Account Number: [id number] Number: Treating RN: Huel Coventry Date of Birth/Sex: 05-02-1928 (82 y.o. Female) Other Clinician: Primary Care Physician: Herminio Commons Treating Britto, Errol Referring Physician: Herminio Commons Physician/Extender: Tania Ade in Treatment: 5 Vital Signs Time Taken: 11:07 Temperature (F): 97.4 Height (in): 61 Pulse (bpm): 63 Weight (lbs): 108 Respiratory Rate (breaths/min): 18 Body Mass Index (BMI): 20.4 Blood Pressure (mmHg): 147/46 Reference Range: 80 - 120 mg / dl Electronic Signature(s) Signed: 07/01/2015 4:33:28 PM By: Elliot Gurney, RN, [name], [name] Entered By: Elliot Gurney, RN, [name], [name] on 06/30/2015 11:07:49

## 2015-07-07 ENCOUNTER — Encounter: Payer: Medicare Other | Admitting: Surgery

## 2015-07-07 DIAGNOSIS — L97821 Non-pressure chronic ulcer of other part of left lower leg limited to breakdown of skin: Secondary | ICD-10-CM | POA: Diagnosis not present

## 2015-07-07 DIAGNOSIS — M810 Age-related osteoporosis without current pathological fracture: Secondary | ICD-10-CM | POA: Diagnosis not present

## 2015-07-07 DIAGNOSIS — L97511 Non-pressure chronic ulcer of other part of right foot limited to breakdown of skin: Secondary | ICD-10-CM | POA: Diagnosis not present

## 2015-07-07 DIAGNOSIS — I890 Lymphedema, not elsewhere classified: Secondary | ICD-10-CM | POA: Diagnosis not present

## 2015-07-07 DIAGNOSIS — L97222 Non-pressure chronic ulcer of left calf with fat layer exposed: Secondary | ICD-10-CM | POA: Diagnosis not present

## 2015-07-07 DIAGNOSIS — D510 Vitamin B12 deficiency anemia due to intrinsic factor deficiency: Secondary | ICD-10-CM | POA: Diagnosis not present

## 2015-07-07 DIAGNOSIS — I10 Essential (primary) hypertension: Secondary | ICD-10-CM | POA: Diagnosis not present

## 2015-07-09 NOTE — Progress Notes (Signed)
[name], [name] ([id number]) Visit Report for 07/07/2015 Chief Complaint Document Details [name], [name] 07/07/2015 12:45 Patient Name: Date of Service: [name]. PM Medical Record Patient Account Number: [id number] Number: Treating RN: [name] Date of Birth/Sex: November 24, 1928 (82 y.o. Female) Other Clinician: Primary Care Treating [name] [name] Physician: Physician/Extender: Referring Physician: Herminio [name] Weeks in Treatment: 6 Information Obtained from: Patient Chief Complaint Patient seen for complaints of Non-Healing Wound to the left lower extremity for about 2 months and to the toes on both feet for about 2 weeks Electronic Signature(s) Signed: 07/07/2015 1:10:01 PM By: [name] Kanner MD, FACS Entered By: [name] 13:10:00 [name], [name]. ([id number]) -------------------------------------------------------------------------------- Debridement Details [name], [name] 07/07/2015 12:45 Patient Name: Date of Service: [name]. PM Medical Record Patient Account Number: [id number] Number: Treating RN: [name] Date of Birth/Sex: 18-Sep-1928 (82 y.o. Female) Other Clinician: Primary Care Treating [name] [name] Physician: Physician/Extender: Referring Physician: Herminio [name] Weeks in Treatment: 6 Debridement Performed for Wound #2 Left,Medial Lower Leg Assessment: Performed By: Physician [name] Kanner, MD Debridement: Open Wound/Selective Debridement Selective Description: Pre-procedure Yes Verification/Time Out Taken: Start Time: 13:05 Pain Control: Other : lidocaine 4% Level: Non-Viable Tissue Total Area Debrided (L x 0.7 (cm) x 0.5 (cm) = 0.35 (cm) W): Tissue and other Non-Viable, Exudate, Fibrin/Slough material debrided: Instrument: Other : gauze and saline Bleeding: None End Time: 13:08 Procedural Pain: 0 Post Procedural Pain: 0 Response to Treatment: Procedure was tolerated well Post Debridement  Measurements of Total Wound Length: (cm) 0.7 Width: (cm) 0.5 Depth: (cm) 0.1 Volume: (cm) 0.027 Post Procedure Diagnosis Same as Pre-procedure Electronic Signature(s) Signed: 07/07/2015 1:09:55 PM By: [name] Kanner MD, FACS Signed: 07/08/2015 5:27:00 PM By: [name] Gurney RN, [name], [name] Entered By: [name] 13:09:55 [name], [name]. ([id number]) [name], [name]. ([id number]) -------------------------------------------------------------------------------- HPI Details [name], [name] 07/07/2015 12:45 Patient Name: Date of Service: [name]. PM Medical Record Patient Account Number: [id number] Number: Treating RN: [name] Date of Birth/Sex: 09-05-1928 (82 y.o. Female) Other Clinician: Primary Care Treating [name] [name] Physician: Physician/Extender: Referring Physician: Herminio [name] Weeks in Treatment: 6 History of Present Illness Location: left lower extremity and both right and left big toes Quality: Patient reports experiencing a dull pain to affected area(s). Severity: Patient states wound are getting worse. Duration: Patient has had the wound for > 2 months prior to seeking treatment at the wound center Timing: Pain in wound is Intermittent (comes and goes Context: The wound occurred when the patient had a lacerated wound to her left lateral calf about 2 months ago with a blunt injury and then sunburnt her feet about 2 weeks ago Modifying Factors: Other treatment(s) tried include:antibiotics and local care Associated Signs and Symptoms: Patient reports having increase swelling. HPI Description: 82 year old patient with a past medical history of essential hypertension, osteoporosis, vitamin D deficiency, pernicious anemia. she was recently seen by her PCP Dr. Herold Harms for a wound on the left leg with cellulitis of the leg and foot. she is also status post appendectomy, left hip arthroplasty.She has been treated with several courses of  antibiotics and wound care and was improving but then went and had a sunburn to both feet and got infected and had another course of Bactrim and Keflex. she had a blister on the right big toe that was red and inflamed. she is also had some recent x-rays had an x-ray of the left ankle which showed no acute osseous findings are also  had x-ray of the left tibia and fibula which was a normal tibia and fibula. on 05/03/2015 she had a venous duplex study of her left lower extremity which showed no evidence of deep or superficial venous thrombosis or incompetence. She is a former smoker and quit in 2008 June Electronic Signature(s) Signed: 07/07/2015 1:10:05 PM By: [name] Kanner MD, FACS Entered By: [name] 13:10:04 [name], [name]. ([id number]) -------------------------------------------------------------------------------- Physical Exam Details [name], [name] 07/07/2015 12:45 Patient Name: Date of Service: [name]. PM Medical Record Patient Account Number: [id number] Number: Treating RN: [name] Date of Birth/Sex: [birth year] (82 y.o. Female) Other Clinician: Primary Care Treating [name] [name] Physician: Physician/Extender: Referring Physician: Herminio [name] Weeks in Treatment: 6 Constitutional . Pulse regular. Respirations normal and unlabored. Afebrile. . Eyes Nonicteric. Reactive to light. Ears, Nose, Mouth, and Throat Lips, teeth, and gums WNL.Marland Kitchen Moist mucosa without lesions. Neck supple and nontender. No palpable supraclavicular or cervical adenopathy. Normal sized without goiter. Respiratory WNL. No retractions.. Cardiovascular Pedal Pulses WNL. No clubbing, cyanosis or edema. Lymphatic No adneopathy. No adenopathy. No adenopathy. Musculoskeletal Adexa without tenderness or enlargement.. Digits and nails w/o clubbing, cyanosis, infection, petechiae, ischemia, or inflammatory conditions.. Integumentary (Hair, Skin) No suspicious  lesions. No crepitus or fluctuance. No peri-wound warmth or erythema. No masses.Marland Kitchen Psychiatric Judgement and insight Intact.. No evidence of depression, anxiety, or agitation.. Notes the wound had a lot of eschar covering it and this was partially exudate which was washed out with moist saline gauze and abrasion to reveal a fairly healthy granulating base. There was no bleeding. Electronic Signature(s) Signed: 07/07/2015 1:11:04 PM By: [name] Kanner MD, FACS Entered By: [name] 13:11:04 [name], [name] ([id number]) -------------------------------------------------------------------------------- Physician Orders Details [name], [name] 07/07/2015 12:45 Patient Name: Date of Service: [name]. PM Medical Record Patient Account Number: [id number] Number: Treating RN: [name] Date of Birth/Sex: 1928/05/17 (82 y.o. Female) Other Clinician: Primary Care Treating [name], Teodoro Spray, Kilan Banfill Physician: Physician/Extender: Referring Physician: Herminio [name] Weeks in Treatment: 6 Verbal / Phone Orders: Yes Clinician: Huel [name] Read Back and Verified: Yes Diagnosis Coding Wound Cleansing Wound #2 Left,Medial Lower Leg o Cleanse wound with mild soap and water o May Shower, gently pat wound dry prior to applying new dressing. Anesthetic Wound #2 Left,Medial Lower Leg o Topical Lidocaine 4% cream applied to wound bed prior to debridement - for office use only Primary Wound Dressing Wound #2 Left,Medial Lower Leg o Prisma Ag Secondary Dressing Wound #2 Left,Medial Lower Leg o Conform/Kerlix - netting Dressing Change Frequency Wound #2 Left,Medial Lower Leg o Change dressing every other day. Follow-up Appointments Wound #2 Left,Medial Lower Leg o Return Appointment in 1 week. Edema Control Wound #2 Left,Medial Lower Leg o Elevate legs to the level of the heart and pump ankles as often as possible o Support Garment 20-30 mm/Hg pressure to: -  left leg-patient to purchase Additional Orders / Instructions Wound #2 Left,Medial Lower Leg Axel, [name]. ([id number]) o Increase protein intake. Medications-please add to medication list. Wound #2 Left,Medial Lower Leg o Other: - Vitamin C, Zinc, Vitamin A, Multivitamin Electronic Signature(s) Signed: 07/07/2015 3:21:55 PM By: [name] Kanner MD, FACS Signed: 07/08/2015 5:27:00 PM By: [name] Gurney RN, [name], [name] Entered By: [name] Gurney, RN, [name], [name] on [name] 13:16:05 [name], [name]. ([id number]) -------------------------------------------------------------------------------- Problem List Details Shipley, Sharis 07/07/2015 12:45 Patient Name: Date of Service: [name]. PM Medical Record Patient Account Number: [id number] Number: Treating RN: [name] Date of Birth/Sex: [birth year] (82  y.o. 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 ([id number]) -------------------------------------------------------------------------------- Progress Note Details Husmann, Donie 07/07/2015 12:45 Patient Name: Date of Service: [name]. PM Medical Record  Patient Account Number: [id number] Number: Treating RN: [name] Date of Birth/Sex: [birth year] (82 y.o. Female) Other Clinician: Primary Care Treating [name] Sueellen Kayes Physician: Physician/Extender: Referring Physician: Herminio [name] Weeks in Treatment: 6 Subjective Chief Complaint Information obtained from Patient Patient seen for complaints of Non-Healing Wound to the left lower extremity for about 2 months and to the toes on both feet for about 2 weeks History of Present Illness (HPI) The following HPI elements were documented for the patient's wound: Location: left lower extremity and both right and left big toes Quality: Patient reports experiencing a dull pain to affected area(s). Severity: Patient states wound are getting worse. Duration: Patient has had the wound for > 2 months prior to seeking treatment at the wound center Timing: Pain in wound is Intermittent (comes and goes Context: The wound occurred when the patient had a lacerated wound to her left lateral calf about 2 months ago with a blunt injury and then sunburnt her feet about 2 weeks ago Modifying Factors: Other treatment(s) tried include:antibiotics and local care Associated Signs and Symptoms: Patient reports having increase swelling. 82 year old patient with a past medical history of essential hypertension, osteoporosis, vitamin D deficiency, pernicious anemia. she was recently seen by her PCP Dr. Herold Harms for a wound on the left leg with cellulitis of the leg and foot. she is also status post appendectomy, left hip arthroplasty.She has been treated with several courses of antibiotics and wound care and was improving but then went and had a sunburn to both feet and got infected and had another course of Bactrim and Keflex. she had a blister on the right big toe that was red and inflamed. she is also had some recent x-rays had an x-ray of the left ankle which showed no acute osseous findings are  also had x-ray of the left tibia and fibula which was a normal tibia and fibula. on 05/03/2015 she had a venous duplex study of her left lower extremity which showed no evidence of deep or superficial venous thrombosis or incompetence. She is a former smoker and quit in 2008 June [name], [name]. ([id number]) Objective Constitutional Pulse regular. Respirations normal and unlabored. Afebrile. Vitals Time Taken: 12:55 PM, Height: 61 in, Weight: 108 lbs, BMI: 20.4, Temperature: 97.9 F, Pulse: 74 bpm, Respiratory Rate: 18 breaths/min, Blood Pressure: 139/52 mmHg. Eyes Nonicteric. Reactive to light. Ears, Nose, Mouth, and Throat Lips, teeth, and gums WNL.Marland Kitchen Moist mucosa without lesions. Neck supple and nontender. No palpable supraclavicular or cervical adenopathy. Normal sized without goiter. Respiratory WNL. No retractions.. Cardiovascular Pedal Pulses WNL. No clubbing, cyanosis or edema. Lymphatic No adneopathy. No adenopathy. No adenopathy. Musculoskeletal Adexa without  tenderness or enlargement.. Digits and nails w/o clubbing, cyanosis, infection, petechiae, ischemia, or inflammatory conditions.Marland Kitchen. Psychiatric Judgement and insight Intact.. No evidence of depression, anxiety, or agitation.. General Notes: the wound had a lot of eschar covering it and this was partially exudate which was washed out with moist saline gauze and abrasion to reveal a fairly healthy granulating base. There was no bleeding. Integumentary (Hair, Skin) No suspicious lesions. No crepitus or fluctuance. No peri-wound warmth or erythema. No masses.. Wound #2 status is Open. Original cause of wound was Trauma. The wound is located on the Left,Medial Lower Leg. The wound measures 0.7cm length x 0.5cm width x 0.1cm depth; 0.275cm^2 area and 0.027cm^3 volume. The wound is limited to skin breakdown. There is a small amount of purulent drainage noted. The wound margin is thickened. There is medium (34-66%)  granulation within the wound bed. There is a medium (34-66%) amount of necrotic tissue within the wound bed including Adherent Slough. The periwound skin appearance exhibited: Scarring, Dry/Scaly. The periwound skin appearance did not exhibit: Callus, Crepitus, Excoriation, Fluctuance, Friable, Induration, Localized Edema, Rash, Maceration, Moist, Atrophie Blanche, Cyanosis, Ecchymosis, Hemosiderin Staining, Mottled, Pallor, Rubor, Erythema. Masso, Nykerria [name]. ([id number]) Periwound temperature was noted as No Abnormality. The periwound has tenderness on palpation. Assessment Active Problems ICD-10 L97.222 - Non-pressure chronic ulcer of left calf with fat layer exposed L97.511 - Non-pressure chronic ulcer of other part of right foot limited to breakdown of skin I89.0 - Lymphedema, not elsewhere classified Procedures Wound #2 Wound #2 is a Trauma, Other located on the Left,Medial Lower Leg . There was a Non-Viable Tissue Open Wound/Selective [phone] debridement with total area of 0.35 sq cm performed by [name] KannerBritto, Ritik Stavola, MD. with the following instrument(s): gauze and saline to remove Non-Viable tissue/material including Exudate and Fibrin/Slough after achieving pain control using Other (lidocaine 4%). A time out was conducted prior to the start of the procedure. There was no bleeding. The procedure was tolerated well with a pain level of 0 throughout and a pain level of 0 following the procedure. Post Debridement Measurements: 0.7cm length x 0.5cm width x 0.1cm depth; 0.027cm^3 volume. Post procedure Diagnosis Wound #2: Same as Pre-Procedure Plan Wound Cleansing: Wound #2 Left,Medial Lower Leg: Cleanse wound with mild soap and water May Shower, gently pat wound dry prior to applying new dressing. Anesthetic: Wound #2 Left,Medial Lower Leg: Topical Lidocaine 4% cream applied to wound bed prior to debridement - for office use only Primary Wound Dressing: Wound #2 Left,Medial Lower  Leg: Prisma Ag Secondary Dressing: Gudiel, Pernella [name]. ([id number]) Wound #2 Left,Medial Lower Leg: Conform/Kerlix - netting Dressing Change Frequency: Wound #2 Left,Medial Lower Leg: Change dressing every other day. Follow-up Appointments: Wound #2 Left,Medial Lower Leg: Return Appointment in 1 week. Edema Control: Wound #2 Left,Medial Lower Leg: Elevate legs to the level of the heart and pump ankles as often as possible Support Garment 20-30 mm/Hg pressure to: - left leg-patient to purchase Additional Orders / Instructions: Wound #2 Left,Medial Lower Leg: Increase protein intake. Medications-please add to medication list.: Wound #2 Left,Medial Lower Leg: Other: - Vitamin C, Zinc, Vitamin A, Multivitamin I have recommended we continue with: 1. Prisma Ag to the left lower extremity and a light Kerlix bandage to keep in place as the skin is very fragile due to being on steroids long-term. 2. I also recommended compression stockings of the 20-30 mm variety to be wound all day to reduce the lymphedema. she found them expensive and we have [facility] and [facility]. 4. regular visits to the wound center. Electronic Signature(s) Signed: 07/07/2015 3:24:47 PM By: [name] Kanner MD, FACS Previous Signature: 07/07/2015 1:12:03 PM Version By: [name] Kanner MD, FACS Entered By: [name] 15:24:47 Plancarte, Dyani [name]. ([id number]) -------------------------------------------------------------------------------- SuperBill Details Patient Name: Heimann, Briggette [name]. Date of Service: 07/07/2015 Medical Record Number: [id number] Patient Account Number: [id number] Date of Birth/Sex: Jun 12, 1928 (82 y.o. 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 limited to breakdown of skin I89.0 Lymphedema, not elsewhere classified Facility Procedures CPT4: Description Modifier Quantity Code [id number] - DEBRIDE WOUND 1ST 20 SQ CM OR < 1 ICD-10 Description Diagnosis L97.222 Non-pressure chronic ulcer of left calf with fat layer exposed L97.511 Non-pressure chronic ulcer of other part of right foot  limited to breakdown of skin I89.0 Lymphedema, not elsewhere classified Physician Procedures CPT4: Description Modifier Quantity Code [id number] - WC PHYS DEBR WO ANESTH 20 SQ CM 1 ICD-10 Description Diagnosis L97.222 Non-pressure chronic ulcer of left calf with fat layer exposed L97.511 Non-pressure chronic ulcer of other part of right foot  limited to breakdown of skin I89.0 Lymphedema, not elsewhere classified Electronic Signature(s) Signed: 07/07/2015 1:12:15 PM By: [name] Kanner MD, FACS Entered By: [name] 13:12:15

## 2015-07-09 NOTE — Progress Notes (Signed)
[name], [name] ([id number]) Visit Report for 07/07/2015 Arrival Information Details [name], [name] 07/07/2015 12:45 Patient Name: Date of Service: T. PM Medical Record Patient Account Number: [id number] Number: Treating RN: Huel Coventry Date of Birth/Sex: April 19, 1928 (82 y.o. Female) Other Clinician: Primary Care Physician: Herminio Commons Treating Evlyn Kanner Referring Physician: Herminio Commons Physician/Extender: Tania Ade in Treatment: 6 Visit Information History Since Last Visit Added or deleted any medications: No Patient Arrived: Cane Any new allergies or adverse reactions: No Arrival Time: 12:54 Signs or symptoms of abuse/neglect since last No Accompanied By: self visito Transfer Assistance: None Hospitalized since last visit: No Patient Identification Verified: Yes Has Dressing in Place as Prescribed: Yes Secondary Verification Process Completed: Yes Pain Present Now: No Patient Requires Transmission-Based No Precautions: Patient Has Alerts: No Electronic Signature(s) Signed: 07/08/2015 5:27:00 PM By: Elliot Gurney, RN, [name], [name], [name] Entered By: Elliot Gurney, RN, [name], [name] on 07/07/2015 12:54:51 Lakeman, Katalia T. ([id number]) -------------------------------------------------------------------------------- Encounter Discharge Information Details [name] 07/07/2015 12:45 Patient Name: Date of Service: T. PM Medical Record Patient Account Number: [id number] Number: Treating RN: Huel Coventry Date of Birth/Sex: 03/20/1928 (82 y.o. Female) Other Clinician: Primary Care Physician: Herminio Commons Treating Evlyn Kanner Referring Physician: Herminio Commons Physician/Extender: Tania Ade in Treatment: 6 Encounter Discharge Information Items Discharge Pain Level: 0 Discharge Condition: Stable Ambulatory Status: Cane Discharge Destination: Home Private Transportation: Auto Accompanied By: self Schedule Follow-up Appointment: Yes Medication Reconciliation completed  and Yes provided to Patient/Care [name]: Clinical Summary of Care: Electronic Signature(s) Signed: 07/08/2015 5:27:00 PM By: Elliot Gurney RN, [name], [name], [name] Entered By: Elliot Gurney, RN, [name], [name] on 07/07/2015 13:16:59 Gulledge, Toy T. ([id number]) -------------------------------------------------------------------------------- Lower Extremity Assessment Details [name] 07/07/2015 12:45 Patient Name: Date of Service: T. PM Medical Record Patient Account Number: [id number] Number: Treating RN: Huel Coventry Date of Birth/Sex: 1928/12/08 (82 y.o. Female) Other Clinician: Primary Care Physician: Herminio Commons Treating Britto, Errol Referring Physician: Herminio Commons Physician/Extender: Tania Ade in Treatment: 6 Vascular Assessment Pulses: Posterior Tibial Dorsalis Pedis Palpable: [Left:Yes] Extremity colors, hair growth, and conditions: Extremity Color: [Left:Hyperpigmented] Hair Growth on Extremity: [Left:Yes] Temperature of Extremity: [Left:Warm] Capillary Refill: [Left:< 3 seconds] Toe Nail Assessment Left: Right: Thick: No Discolored: No Deformed: No Improper Length and Hygiene: No Electronic Signature(s) Signed: 07/08/2015 5:27:00 PM By: Elliot Gurney, RN, [name], [name], [name] Entered By: Elliot Gurney, RN, [name], [name] on 07/07/2015 12:58:49 Meyers, Athalene T. ([id number]) -------------------------------------------------------------------------------- Multi Wound Chart Details [name] 07/07/2015 12:45 Patient Name: Date of Service: T. PM Medical Record Patient Account Number: [id number] Number: Treating RN: Huel Coventry Date of Birth/Sex: 03/05/1928 (82 y.o. Female) Other Clinician: Primary Care Physician: Herminio Commons Treating Evlyn Kanner Referring Physician: Herminio Commons Physician/Extender: Tania Ade in Treatment: 6 Vital Signs Height(in): 61 Pulse(bpm): 74 Weight(lbs): 108 Blood Pressure 139/52 (mmHg): Body Mass Index(BMI): 20 Temperature(F): 97.9 Respiratory  Rate 18 (breaths/min): Photos: [N/A:N/A] Wound Location: Left Lower Leg - Medial N/A N/A Wounding Event: Trauma N/A N/A Primary Etiology: Trauma, Other N/A N/A Comorbid History: Cataracts, Anemia, N/A N/A Hypertension, History of Burn, Gout, Osteoarthritis Date Acquired: 04/06/2015 N/A N/A Weeks of Treatment: 6 N/A N/A Wound Status: Open N/A N/A Measurements L x W x D 0.7x0.5x0.1 N/A N/A (cm) Area (cm) : 0.275 N/A N/A Volume (cm) : 0.027 N/A N/A % Reduction in Area: 95.80% N/A N/A % Reduction in Volume: 95.90% N/A N/A Classification: Full Thickness Without N/A N/A Exposed Support Structures Exudate Amount: Small N/A N/A Exudate Type: Purulent N/A N/A Exudate Color: yellow, brown, green N/A N/A Krogstad, Prudie  T. ([id number]) Wound Margin: Thickened N/A N/A Granulation Amount: Medium (34-66%) N/A N/A Necrotic Amount: Medium (34-66%) N/A N/A Exposed Structures: Fascia: No N/A N/A Fat: No Tendon: No Muscle: No Joint: No Bone: No Limited to Skin Breakdown Epithelialization: Small (1-33%) N/A N/A Periwound Skin Texture: Scarring: Yes N/A N/A Edema: No Excoriation: No Induration: No Callus: No Crepitus: No Fluctuance: No Friable: No Rash: No Periwound Skin Dry/Scaly: Yes N/A N/A Moisture: Maceration: No Moist: No Periwound Skin Color: Atrophie Blanche: No N/A N/A Cyanosis: No Ecchymosis: No Erythema: No Hemosiderin Staining: No Mottled: No Pallor: No Rubor: No Temperature: No Abnormality N/A N/A Tenderness on Yes N/A N/A Palpation: Wound Preparation: Ulcer Cleansing: N/A N/A Rinsed/Irrigated with Saline Topical Anesthetic Applied: Other: lidocaine 4% Treatment Notes Electronic Signature(s) Signed: 07/08/2015 5:27:00 PM By: Elliot Gurney, RN, [name], [name], [name] Entered By: Elliot Gurney, RN, [name], [name] on 07/07/2015 13:07:06 Patriarca, Aracelie T. ([id number]) Deiss, [name]  ([id number]) -------------------------------------------------------------------------------- Multi-Disciplinary Care Plan Details [name], [name] 07/07/2015 12:45 Patient Name: Date of Service: T. PM Medical Record Patient Account Number: [id number] Number: Treating RN: Huel Coventry Date of Birth/Sex: 1928-01-31 (82 y.o. Female) Other Clinician: Primary Care Physician: Herminio Commons Treating Evlyn Kanner Referring Physician: Herminio Commons Physician/Extender: Tania Ade in Treatment: 6 Active Inactive Abuse / Safety / Falls / Self Care Management Nursing Diagnoses: Impaired physical mobility Potential for falls Self care deficit: actual or potential Goals: Patient will remain injury free Date Initiated: 05/26/2015 Goal Status: Active Patient/caregiver will verbalize understanding of the importance to maintain current immunizations/vaccinations Date Initiated: 05/26/2015 Goal Status: Active Patient/caregiver will verbalize/demonstrate measure taken to improve self care Date Initiated: 05/26/2015 Goal Status: Active Patient/caregiver will verbalize/demonstrate measures taken to improve the patient's personal safety Date Initiated: 05/26/2015 Goal Status: Active Patient/caregiver will verbalize/demonstrate measures taken to prevent injury and/or falls Date Initiated: 05/26/2015 Goal Status: Active Patient/caregiver will verbalize/demonstrate understanding of what to do in case of emergency Date Initiated: 05/26/2015 Goal Status: Active Interventions: Assess fall risk on admission and as needed Assess: immobility, friction, shearing, incontinence upon admission and as needed Assess impairment of mobility on admission and as needed per policy Assess self care needs on admission and as needed Rostron, Edana T. ([id number]) Provide education on basic hygiene Provide education on fall prevention Provide education on personal and home safety Provide education on safe  transfers Notes: Orientation to the Wound Care Program Nursing Diagnoses: Knowledge deficit related to the wound healing center program Goals: Patient/caregiver will verbalize understanding of the Wound Healing Center Program Date Initiated: 05/26/2015 Goal Status: Active Interventions: Provide education on orientation to the wound center Notes: Venous Leg Ulcer Nursing Diagnoses: Knowledge deficit related to disease process and management Potential for venous Insuffiency (use before diagnosis confirmed) Goals: Patient will maintain optimal edema control Date Initiated: 05/26/2015 Goal Status: Active Patient/caregiver will verbalize understanding of disease process and disease management Date Initiated: 05/26/2015 Goal Status: Active Verify adequate tissue perfusion prior to therapeutic compression application Date Initiated: 05/26/2015 Goal Status: Active Interventions: Assess peripheral edema status every visit. Provide education on venous insufficiency Notes: Wound/Skin Impairment Zwahlen, Saavi T. ([id number]) Nursing Diagnoses: Impaired tissue integrity Knowledge deficit related to smoking impact on wound healing Knowledge deficit related to ulceration/compromised skin integrity Goals: Patient/caregiver will verbalize understanding of skin care regimen Date Initiated: 05/26/2015 Goal Status: Active Ulcer/skin breakdown will have a volume reduction of 30% by week 4 Date Initiated: 05/26/2015 Goal Status: Active Ulcer/skin breakdown will have a volume reduction of 50% by week 8 Date Initiated: 05/26/2015 Goal Status:  Active Ulcer/skin breakdown will have a volume reduction of 80% by week 12 Date Initiated: 05/26/2015 Goal Status: Active Ulcer/skin breakdown will heal within 14 weeks Date Initiated: 05/26/2015 Goal Status: Active Interventions: Assess patient/caregiver ability to obtain necessary supplies Assess patient/caregiver ability to perform ulcer/skin care regimen  upon admission and as needed Assess ulceration(s) every visit Provide education on ulcer and skin care Treatment Activities: Referred to DME Ramon Brant for dressing supplies : 05/26/2015 Skin care regimen initiated : 05/26/2015 Topical wound management initiated : 05/26/2015 Notes: Electronic Signature(s) Signed: 07/08/2015 5:27:00 PM By: Elliot Gurney, RN, [name], [name], [name] Entered By: Elliot Gurney, RN, [name], [name] on 07/07/2015 13:06:58 Pipe, Deundra T. ([id number]) -------------------------------------------------------------------------------- Pain Assessment Details Holtmeyer, Gracey 07/07/2015 12:45 Patient Name: Date of Service: T. PM Medical Record Patient Account Number: [id number] Number: Treating RN: Huel Coventry Date of Birth/Sex: 05/17/1928 (82 y.o. Female) Other Clinician: Primary Care Physician: Herminio Commons Treating Evlyn Kanner Referring Physician: Herminio Commons Physician/Extender: Tania Ade in Treatment: 6 Active Problems Location of Pain Severity and Description of Pain Patient Has Paino No Site Locations With Dressing Change: No Pain Management and Medication Current Pain Management: Electronic Signature(s) Signed: 07/08/2015 5:27:00 PM By: Elliot Gurney, RN, [name], [name], [name] Entered By: Elliot Gurney, RN, [name], [name] on 07/07/2015 12:54:58 Bachtel, Thelma Barge ([id number]) -------------------------------------------------------------------------------- Patient/Caregiver Education Details Burgert, Maydell 07/07/2015 12:45 Patient Name: Date of Service: T. PM Medical Record Patient Account Number: [id number] Number: Treating RN: Huel Coventry Date of Birth/Gender: 1928/08/30 (82 y.o. Female) Other Clinician: Primary Care Physician: Herminio Commons Treating Evlyn Kanner Referring Physician: Herminio Commons Physician/Extender: Tania Ade in Treatment: 6 Education Assessment Education Provided To: Patient Education Topics Provided Venous: Handouts: Controlling Swelling with Compression Stockings ,  Other: patient to ger stockings Methods: Demonstration, Explain/Verbal Responses: State content correctly Electronic Signature(s) Signed: 07/08/2015 5:27:00 PM By: Elliot Gurney, RN, [name], [name], [name] Entered By: Elliot Gurney, RN, [name], [name] on 07/07/2015 13:17:27 Heilman, Marguita T. ([id number]) -------------------------------------------------------------------------------- Wound Assessment Details Weinel, Elisandra 07/07/2015 12:45 Patient Name: Date of Service: T. PM Medical Record Patient Account Number: [id number] Number: Treating RN: Huel Coventry Date of Birth/Sex: Mar 31, 1928 (82 y.o. Female) Other Clinician: Primary Care Physician: Herminio Commons Treating Britto, Errol Referring Physician: Herminio Commons Physician/Extender: Tania Ade in Treatment: 6 Wound Status Wound Number: 2 Primary Trauma, Other Etiology: Wound Location: Left Lower Leg - Medial Wound Open Wounding Event: Trauma Status: Date Acquired: 04/06/2015 Comorbid Cataracts, Anemia, Hypertension, Weeks Of Treatment: 6 History: History of Burn, Gout, Osteoarthritis Clustered Wound: No Photos Wound Measurements Length: (cm) 0.7 Width: (cm) 0.5 Depth: (cm) 0.1 Area: (cm) 0.275 Volume: (cm) 0.027 % Reduction in Area: 95.8% % Reduction in Volume: 95.9% Epithelialization: Small (1-33%) Wound Description Full Thickness Without Exposed Classification: Support Structures Wound Margin: Thickened Exudate Small Amount: Exudate Type: Purulent Exudate Color: yellow, brown, green Foul Odor After Cleansing: No Wound Bed Granulation Amount: Medium (34-66%) Exposed Structure Necrotic Amount: Medium (34-66%) Fascia Exposed: No Adger, Shalaine T. ([id number]) Necrotic Quality: Adherent Slough Fat Layer Exposed: No Tendon Exposed: No Muscle Exposed: No Joint Exposed: No Bone Exposed: No Limited to Skin Breakdown Periwound Skin Texture Texture Color No Abnormalities Noted: No No Abnormalities Noted: No Callus:  No Atrophie Blanche: No Crepitus: No Cyanosis: No Excoriation: No Ecchymosis: No Fluctuance: No Erythema: No Friable: No Hemosiderin Staining: No Induration: No Mottled: No Localized Edema: No Pallor: No Rash: No Rubor: No Scarring: Yes Temperature / Pain Moisture Temperature: No Abnormality No Abnormalities Noted: No Tenderness on Palpation: Yes Dry / Scaly: Yes Maceration: No Moist:  No Wound Preparation Ulcer Cleansing: Rinsed/Irrigated with Saline Topical Anesthetic Applied: Other: lidocaine 4%, Treatment Notes Wound #2 (Left, Medial Lower Leg) 1. Cleansed with: Clean wound with Normal Saline 2. Anesthetic Topical Lidocaine 4% cream to wound bed prior to debridement 4. Dressing Applied: Prisma Ag 5. Secondary Dressing Applied Gauze and Kerlix/Conform Notes netting Electronic Signature(s) Signed: 07/08/2015 5:27:00 PM By: Elliot GurneyWoody, RN, [name], [name], [name] Entered By: Elliot GurneyWoody, RN, [name], [name] on 07/07/2015 13:02:08 Suk, Darcy T. ([id number]) Kracht, Korianna T. ([id number]) -------------------------------------------------------------------------------- Vitals Details Furno, Khila 07/07/2015 12:45 Patient Name: Date of Service: T. PM Medical Record Patient Account Number: [id number] Number: Treating RN: Huel CoventryWoody, [name] Date of Birth/Sex: 04/04/1928 (82 y.o. Female) Other Clinician: Primary Care Physician: Herminio CommonsKIM, [name] Treating Britto, Errol Referring Physician: Herminio CommonsKIM, [name] Physician/Extender: Tania AdeWeeks in Treatment: 6 Vital Signs Time Taken: 12:55 Temperature (F): 97.9 Height (in): 61 Pulse (bpm): 74 Weight (lbs): 108 Respiratory Rate (breaths/min): 18 Body Mass Index (BMI): 20.4 Blood Pressure (mmHg): 139/52 Reference Range: 80 - 120 mg / dl Electronic Signature(s) Signed: 07/08/2015 5:27:00 PM By: Elliot GurneyWoody, RN, [name], [name], [name] Entered By: Elliot GurneyWoody, RN, [name], [name] on 07/07/2015 12:55:15

## 2015-07-14 ENCOUNTER — Encounter: Payer: Medicare Other | Admitting: Surgery

## 2015-07-14 DIAGNOSIS — L97222 Non-pressure chronic ulcer of left calf with fat layer exposed: Secondary | ICD-10-CM | POA: Diagnosis not present

## 2015-07-14 DIAGNOSIS — L97821 Non-pressure chronic ulcer of other part of left lower leg limited to breakdown of skin: Secondary | ICD-10-CM | POA: Diagnosis not present

## 2015-07-14 DIAGNOSIS — D510 Vitamin B12 deficiency anemia due to intrinsic factor deficiency: Secondary | ICD-10-CM | POA: Diagnosis not present

## 2015-07-14 DIAGNOSIS — S81802A Unspecified open wound, left lower leg, initial encounter: Secondary | ICD-10-CM | POA: Diagnosis not present

## 2015-07-14 DIAGNOSIS — I890 Lymphedema, not elsewhere classified: Secondary | ICD-10-CM | POA: Diagnosis not present

## 2015-07-14 DIAGNOSIS — L97511 Non-pressure chronic ulcer of other part of right foot limited to breakdown of skin: Secondary | ICD-10-CM | POA: Diagnosis not present

## 2015-07-14 DIAGNOSIS — I10 Essential (primary) hypertension: Secondary | ICD-10-CM | POA: Diagnosis not present

## 2015-07-14 DIAGNOSIS — M810 Age-related osteoporosis without current pathological fracture: Secondary | ICD-10-CM | POA: Diagnosis not present
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
## 2015-07-15 NOTE — Progress Notes (Signed)
[name], [name] [name] ([id number]) Visit Report for 07/14/2015 Chief Complaint Document Details [name] [name], [facility] 07/14/2015 12:45 Patient Name: Date of Service: T. PM Medical Record Patient Account Number: [id number] Number: Treating RN: [name] [name] Date of Birth/Sex: 1928/12/10 (82 y.o. Female) Other Clinician: Primary Care Treating [name] [name] Cayson Kalb Physician: Physician/Extender: Referring Physician: Herminio [name] Weeks in Treatment: 7 Information Obtained from: Patient Chief Complaint Patient seen for complaints of Non-Healing Wound to the left lower extremity for about 2 months and to the toes on both feet for about 2 weeks Electronic Signature(s) Signed: 07/14/2015 1:14:48 PM By: [name] Kanner MD, FACS Entered By: [name] [name] on 07/14/2015 13:14:48 [name], [name] ([id number]) -------------------------------------------------------------------------------- HPI Details [name] [name], [name] [name] 07/14/2015 12:45 Patient Name: Date of Service: T. PM Medical Record Patient Account Number: [id number] Number: Treating RN: [name] [name] Date of Birth/Sex: 1928-11-07 (82 y.o. Female) Other Clinician: Primary Care Treating [name] [name] [name] [name] Physician: Physician/Extender: Referring Physician: Herminio [name] Weeks in Treatment: 7 History of Present Illness Location: left lower extremity and both right and left big toes Quality: Patient reports experiencing a dull pain to affected area(s). Severity: Patient states wound are getting worse. Duration: Patient has had the wound for > 2 months prior to seeking treatment at the wound center Timing: Pain in wound is Intermittent (comes and goes Context: The wound occurred when the patient had a lacerated wound to her left lateral calf about 2 months ago with a blunt injury and then sunburnt her feet about 2 weeks ago Modifying Factors: Other treatment(s) tried include:antibiotics and local care Associated Signs and  Symptoms: Patient reports having increase swelling. HPI Description: 82 year old patient with a past medical history of essential hypertension, osteoporosis, vitamin D deficiency, pernicious anemia. she was recently seen by her PCP Dr. Herold [name] for a wound on the left leg with cellulitis of the leg and foot. she is also status post appendectomy, left hip arthroplasty.She has been treated with several courses of antibiotics and wound care and was improving but then went and had a sunburn to both feet and got infected and had another course of Bactrim and Keflex. she had a blister on the right big toe that was red and inflamed. she is also had some recent x-rays had an x-ray of the left ankle which showed no acute osseous findings are also had x-ray of the left tibia and fibula which was a normal tibia and fibula. on 05/03/2015 she had a venous duplex study of her left lower extremity which showed no evidence of deep or superficial venous thrombosis or incompetence. She is a former smoker and quit in 2008 June Electronic Signature(s) Signed: 07/14/2015 1:14:53 PM By: [name] Kanner MD, FACS Entered By: [name] [name] on 07/14/2015 13:14:52 [name] [name], [name] ([id number]) -------------------------------------------------------------------------------- Physical Exam Details [name] [name], [name] [name] 07/14/2015 12:45 Patient Name: Date of Service: T. PM Medical Record Patient Account Number: [id number] Number: Treating RN: [name] [name] Date of Birth/Sex: 03-15-1928 (82 y.o. Female) Female) Other Clinician: Primary Care Treating [name] [name] [name] [name] Physician: Physician/Extender: Referring Physician: Herminio [name] Weeks in Treatment: 7 Constitutional . Pulse regular. Respirations normal and unlabored. Afebrile. . Eyes Nonicteric. Reactive to light. Ears, Nose, Mouth, and Throat Lips, teeth, and gums WNL.[name] [name] Moist mucosa without lesions. Neck supple and nontender. No palpable supraclavicular or  cervical adenopathy. Normal sized without goiter. Respiratory WNL. No retractions.. Cardiovascular Pedal Pulses WNL. No clubbing, cyanosis or edema. Lymphatic No adneopathy. No adenopathy. No adenopathy. Musculoskeletal Adexa without tenderness or enlargement.. Digits and nails w/o  clubbing, cyanosis, infection, petechiae, ischemia, or inflammatory conditions.. Integumentary (Hair, Skin) No suspicious lesions. No crepitus or fluctuance. No peri-wound warmth or erythema. No masses.[name] [name]. Psychiatric Judgement and insight Intact.. No evidence of depression, anxiety, or agitation.. Notes the wound had a very dry eschar which was gently removed with a toothed forcep and everything under this is nicely healed and there is no open ulceration. Electronic Signature(s) Signed: 07/14/2015 1:15:30 PM By: [name] KannerBritto, Tiffany Calmes MD, FACS Entered By: [name] KannerBritto, Ledarius [name] on 07/14/2015 13:15:30 [name] [name], [name] BargeEBECCA T. ([id number]) -------------------------------------------------------------------------------- Physician Orders Details [name] [name], [name] [name] 07/14/2015 12:45 Patient Name: Date of Service: T. PM Medical Record Patient Account Number: [id number] Number: Treating RN: [name] CoventryWoody, [name] Date of Birth/Sex: 1928/06/20 (82 y.o. Female) Other Clinician: Primary Care Treating [name], [name] SprayHAN Keziah Avis Physician: Physician/Extender: Referring Physician: Lucienne MinksKIM, [name] Weeks in Treatment: 7 Verbal / Phone Orders: Yes Clinician: Huel CoventryWoody, [name] Read Back and Verified: Yes Diagnosis [facility]oding [facility] Discharge from Wound Care Center - treatment complete Electronic Signature(s) Signed: 07/14/2015 4:59:18 PM By: [name] KannerBritto, [name] Frampton MD, FACS Signed: 07/14/2015 5:28:17 PM By: Elliot GurneyWoody, RN, [name], [name], [name] Entered By: Elliot GurneyWoody, RN, [name], [name] on 07/14/2015 13:09:23 [name] [name], [name] [name] [name]. ([id number]) -------------------------------------------------------------------------------- Problem List Details [name] [name], [name] [name]  07/14/2015 12:45 Patient Name: Date of Service: T. PM Medical Record Patient Account Number: [id number] Number: Treating RN: [name] CoventryWoody, [name] Date of Birth/Sex: 1928/06/20 (82 y.o. Female) Other Clinician: Primary Care Treating [name], [name] SprayHAN Abbigael Detlefsen Physician: Physician/Extender: Referring Physician: Herminio CommonsKIM, [name] Weeks in Treatment: 7 Active Problems ICD-10 Encounter Code Description Active Date Diagnosis L97.222 Non-pressure chronic ulcer of left calf with fat layer 05/26/2015 Yes exposed L97.511 Non-pressure chronic ulcer of other part of right foot 05/26/2015 Yes limited to breakdown of skin I89.0 Lymphedema, not elsewhere classified 06/13/2015 Yes Inactive Problems Resolved Problems ICD-10 Code Description Active Date Resolved Date L55.1 Sunburn of second degree 05/26/2015 05/26/2015 L97.521 Non-pressure chronic ulcer of other part of left foot limited 05/26/2015 05/26/2015 to breakdown of skin Electronic Signature(s) Signed: 07/14/2015 1:14:42 PM By: [name] KannerBritto, Braylie Badami MD, FACS Entered By: [name] KannerBritto, Jelisha Weed on 07/14/2015 13:14:42 [name] [name], [name] ([id number]) -------------------------------------------------------------------------------- Progress Note Details [name] [name], [name] [name] 07/14/2015 12:45 Patient Name: Date of Service: T. PM Medical Record Patient Account Number: [id number] Number: Treating RN: [name] CoventryWoody, [name] Date of Birth/Sex: 1928/06/20 (82 y.o. Female) Other Clinician: Primary Care Treating [name] [name] Deshana Rominger Physician: Physician/Extender: Referring Physician: Herminio CommonsKIM, [name] Weeks in Treatment: 7 Subjective Chief Complaint Information obtained from Patient Patient seen for complaints of Non-Healing Wound to the left lower extremity for about 2 months and to the toes on both feet for about 2 weeks History of Present Illness (HPI) The following HPI elements were documented for the patient's wound: Location: left lower extremity and both right and left big  toes Quality: Patient reports experiencing a dull pain to affected area(s). Severity: Patient states wound are getting worse. Duration: Patient has had the wound for > 2 months prior to seeking treatment at the wound center Timing: Pain in wound is Intermittent (comes and goes Context: The wound occurred when the patient had a lacerated wound to her left lateral calf about 2 months ago with a blunt injury and then sunburnt her feet about 2 weeks ago Modifying Factors: Other treatment(s) tried include:antibiotics and local care Associated Signs and Symptoms: Patient reports having increase swelling. 82 year old patient with a past medical history of essential hypertension, osteoporosis, vitamin D deficiency, pernicious anemia. she was recently seen by her PCP Dr. Herold HarmsHanna [name] for a wound on  the left leg with cellulitis of the leg and foot. she is also status post appendectomy, left hip arthroplasty.She has been treated with several courses of antibiotics and wound care and was improving but then went and had a sunburn to both feet and got infected and had another course of Bactrim and Keflex. she had a blister on the right big toe that was red and inflamed. she is also had some recent x-rays had an x-ray of the left ankle which showed no acute osseous findings are also had x-ray of the left tibia and fibula which was a normal tibia and fibula. on 05/03/2015 she had a venous duplex study of her left lower extremity which showed no evidence of deep or superficial venous thrombosis or incompetence. She is a former smoker and quit in 2008 June [name] [name], [name] ([id number]) Objective Constitutional Pulse regular. Respirations normal and unlabored. Afebrile. Vitals Time Taken: 12:54 PM, Height: 61 in, Weight: 108 lbs, BMI: 20.4, Temperature: 97.8 F, Pulse: 62 bpm, Respiratory Rate: 18 breaths/min, Blood Pressure: 147/69 mmHg. Eyes Nonicteric. Reactive to light. Ears, Nose, Mouth, and  Throat Lips, teeth, and gums WNL.[name] [name] Moist mucosa without lesions. Neck supple and nontender. No palpable supraclavicular or cervical adenopathy. Normal sized without goiter. Respiratory WNL. No retractions.. Cardiovascular Pedal Pulses WNL. No clubbing, cyanosis or edema. Lymphatic No adneopathy. No adenopathy. No adenopathy. Musculoskeletal Adexa without tenderness or enlargement.. Digits and nails w/o clubbing, cyanosis, infection, petechiae, ischemia, or inflammatory conditions.[name] [name] Psychiatric Judgement and insight Intact.. No evidence of depression, anxiety, or agitation.. General Notes: the wound had a very dry eschar which was gently removed with a toothed forcep and everything under this is nicely healed and there is no open ulceration. Integumentary (Hair, Skin) No suspicious lesions. No crepitus or fluctuance. No peri-wound warmth or erythema. No masses.. Wound #2 status is Healed - Epithelialized. Original cause of wound was Trauma. The wound is located on the Left,Medial Lower Leg. The wound measures 0cm length x 0cm width x 0cm depth; 0cm^2 area and 0cm^3 volume. The wound is limited to skin breakdown. There is no tunneling or undermining noted. There is a small amount of purulent drainage noted. The wound margin is thickened. There is large (67-100%) granulation within the wound bed. There is no necrotic tissue within the wound bed. The periwound skin appearance exhibited: Dry/Scaly. The periwound skin appearance did not exhibit: Callus, Crepitus, Excoriation, Fluctuance, Friable, Induration, Localized Edema, Rash, Scarring, Maceration, Moist, Atrophie Blanche, Cyanosis, Ecchymosis, Hemosiderin Staining, Mottled, Pallor, Rubor, Erythema. Periwound Pullara, Julie-Ann T. ([id number]) temperature was noted as No Abnormality. The periwound has tenderness on palpation. Assessment Active Problems ICD-10 L97.222 - Non-pressure chronic ulcer of left calf with fat layer  exposed L97.511 - Non-pressure chronic ulcer of other part of right foot limited to breakdown of skin I89.0 - Lymphedema, not elsewhere classified Plan Discharge From [facility] Services: Discharge from Wound Care Center - treatment complete The wound is completely healed and I have asked her to apply a bordered foam every few days and protect this wound so as to prevent the supple scar from breaking down again. She is agreeable. She will be discharged from the wound care services and be seen back as needed. Electronic Signature(s) Signed: 07/14/2015 1:16:05 PM By: [name] Kanner MD, FACS Entered By: [name] [name] on 07/14/2015 13:16:05 Weider, Markeeta T. ([id number]) -------------------------------------------------------------------------------- SuperBill Details Patient Name: Maret, Briann T. Date of Service: 07/14/2015 Medical Record Number: [id number] Patient Account Number: [id number] Date of Birth/Sex: [birth year] (82 y.o. Female) Treating  RN: [name] CoventryWoody, [name] Primary Care Physician: [name] CommonsKIM, [name] Other Clinician: Referring Physician: Herminio CommonsKIM, [name] Treating Physician/Extender: Rudene ReBritto, Aloise Copus Weeks in Treatment: 7 Diagnosis Coding ICD-10 Codes Code Description [phone] Non-pressure chronic ulcer of left calf with fat layer exposed L97.511 Non-pressure chronic ulcer of other part of right foot limited to breakdown of skin I89.0 Lymphedema, not elsewhere classified Physician Procedures CPT4: Description Modifier Quantity Code [id number] - WC PHYS LEVEL 2 - EST PT 1 ICD-10 Description Diagnosis L97.222 Non-pressure chronic ulcer of left calf with fat layer exposed L97.511 Non-pressure chronic ulcer of other part of right foot limited  to breakdown of skin I89.0 Lymphedema, not elsewhere classified Electronic Signature(s) Signed: 07/14/2015 1:16:20 PM By: [name] KannerBritto, Codylee Patil MD, FACS Entered By: [name] KannerBritto, Papa Piercefield on 07/14/2015 13:16:20

## 2015-07-21 ENCOUNTER — Encounter: Payer: Self-pay | Admitting: Family Medicine

## 2015-07-21 ENCOUNTER — Encounter: Payer: BLUE CROSS/BLUE SHIELD | Admitting: Family Medicine

## 2015-07-21 ENCOUNTER — Other Ambulatory Visit: Payer: Self-pay | Admitting: Family Medicine

## 2015-07-21 ENCOUNTER — Ambulatory Visit (INDEPENDENT_AMBULATORY_CARE_PROVIDER_SITE_OTHER): Payer: Medicare Other | Admitting: Family Medicine

## 2015-07-21 VITALS — BP 128/60 | HR 69 | Temp 97.4°F | Ht 61.0 in | Wt 108.1 lb

## 2015-07-21 DIAGNOSIS — Z0000 Encounter for general adult medical examination without abnormal findings: Secondary | ICD-10-CM | POA: Diagnosis not present

## 2015-07-21 DIAGNOSIS — D510 Vitamin B12 deficiency anemia due to intrinsic factor deficiency: Secondary | ICD-10-CM | POA: Diagnosis not present

## 2015-07-21 DIAGNOSIS — I10 Essential (primary) hypertension: Secondary | ICD-10-CM | POA: Diagnosis not present

## 2015-07-21 DIAGNOSIS — M353 Polymyalgia rheumatica: Secondary | ICD-10-CM

## 2015-07-21 DIAGNOSIS — R739 Hyperglycemia, unspecified: Secondary | ICD-10-CM | POA: Diagnosis not present

## 2015-07-21 DIAGNOSIS — E785 Hyperlipidemia, unspecified: Secondary | ICD-10-CM

## 2015-07-21 DIAGNOSIS — E559 Vitamin D deficiency, unspecified: Secondary | ICD-10-CM

## 2015-07-21 DIAGNOSIS — M810 Age-related osteoporosis without current pathological fracture: Secondary | ICD-10-CM

## 2015-07-21 DIAGNOSIS — H9193 Unspecified hearing loss, bilateral: Secondary | ICD-10-CM

## 2015-07-21 LAB — LIPID PANEL
CHOL/HDL RATIO: 3
Cholesterol: 187 mg/dL (ref 0–200)
HDL: 64.1 mg/dL (ref >39.00)
LDL CALC: 89 mg/dL (ref 0–99)
NonHDL: 122.69
Triglycerides: 168 mg/dL — ABNORMAL HIGH (ref 0.0–149.0)
VLDL: 33.6 mg/dL (ref 0.0–40.0)

## 2015-07-21 LAB — HEMOGLOBIN A1C: HEMOGLOBIN A1C: 5.8 % (ref 4.6–6.5)

## 2015-07-21 NOTE — Progress Notes (Signed)
Pre visit review using our clinic review tool, if applicable. No additional management support is needed unless otherwise documented below in the visit note. 

## 2015-07-21 NOTE — Progress Notes (Signed)
Medicare Annual Preventive Care Visit  (initial annual wellness or annual wellness exam)  Concerns and/or follow up today:  HTN: -current meds: chlorthalidone 12.5 -did not tol BB and had mild edema with norvasc -denies: CP, SOB, swelling, palpitations  Mild Hyperlipidemia: -not on medication -diet and exercise: exercising daily, eating healthy  Polymyalgia Rheumatica: -sees Dr Kellie Simmering in rhuematology -on chronic low dose prednisone and fosamax - reports he monitors her bone health  Does not want to do mammograms.  Wound finally healed on leg. Told to use compression socks on L leg chronically. She wants my opinion on this as does not like to wear it.  ROS: negative for report of fevers, unintentional weight loss, vision changes, vision loss, hearing loss or change, chest pain, sob, hemoptysis, melena, hematochezia, hematuria, genital discharge or lesions, falls, bleeding or bruising, loc, thoughts of suicide or self harm, memory loss  1.) Patient-completed health risk assessment  - completed and reviewed, see scanned documentation  2.) Review of Medical History: -PMH, PSH, Family History and current specialty and care providers reviewed and updated and listed below  - see scanned in document in chart and below  Past Medical History  Diagnosis Date  . Hypertension   . Hyperlipemia 03/27/2007  . Pernicious anemia 10/25/2006  . Vitamin D deficiency 07/12/2006  . Polymyalgia rheumatica (HCC) 03/27/2007    sees Dr. Kellie Simmering in rheumatology  . Osteoporosis 07/12/2006  . Alcohol abuse   . Emphysema of lung (HCC) 01/26/2009    pt. unsure of this reports as of 07/31/2012  . Gout 08/11/2012  . Fracture of fifth metatarsal bone of right foot 08/29/2012    Past Surgical History  Procedure Laterality Date  . Appendectomy  07/12/2006  . Partial hip arthroplasty      left side- 2x's ( 2nd surgery to correct 1st replacement), R side -   . Tooth extraction N/A 08/04/2012    Procedure:  EXTRACTIONS 17, 20;  Surgeon: Georgia Lopes, DDS;  Location: [facility] OR;  Service: Oral Surgery;  Laterality: N/A;    Social History   Social History  . Marital Status: Married    Spouse Name: N/A  . Number of Children: N/A  . Years of Education: N/A   Occupational History  . Not on file.   Social History Main Topics  . Smoking status: Former Smoker    Quit date: 07/12/2006  . Smokeless tobacco: Not on file  . Alcohol Use: 6.0 oz/week    10 Shots of liquor per week     Comment: quit completely - 2012  . Drug Use: No  . Sexual Activity: No   Other Topics Concern  . Not on file   Social History Narrative   Work or School: none      Home Situation: lives with husband - drives, husband takes care of finances, dresses herself, takes care of housework and cooking      Spiritual Beliefs: [employer]      Lifestyle: uses cane when goes out, exercises sometimes - diet healthy             Family History  Problem Relation Age of Onset  . Mental illness Mother     alzheimer dementia  . Ulcers Father   . Heart disease Father   . Cancer Father     lung, smoker    Current Outpatient Prescriptions on File Prior to Visit  Medication Sig Dispense Refill  . alendronate (FOSAMAX) 70 MG tablet Take 1 tablet (70  mg total) by mouth every 7 (seven) days. 12 tablet 3  . CALCIUM PO Take by mouth.    . Cyanocobalamin (VITAMIN B-12) 1000 MCG SUBL Place under the tongue.    . hydrochlorothiazide (HYDRODIURIL) 12.5 MG tablet TAKE 1 BY MOUTH DAILY 90 tablet 0  . predniSONE (DELTASONE) 5 MG tablet Take 2.5 mg by mouth daily.      No current facility-administered medications on file prior to visit.     3.) Review of functional ability and level of safety:  Any difficulty hearing?  NO  History of falling?  NO  Any trouble with IADLs - using a phone, using transportation, grocery shopping, preparing meals, doing housework, doing laundry, taking medications and managing  money? NO  Advance Directives? Yes, agrees to bring copy  See summary of recommendations in Patient Instructions below.  4.) Physical Exam Filed Vitals:   07/21/15 0821  BP: 128/60  Pulse: 69  Temp: 97.4 F (36.3 C)   Estimated body mass index is 20.44 kg/(m^2) as calculated from the following:   Height as of this encounter: 5\' 1"  (1.549 m).   Weight as of this encounter: 108 lb 1.6 oz (49.034 kg).  EKG (optional): deferred  General: alert, appear well hydrated and in no acute distress  HEENT: visual acuity grossly intact  CV: HRRR  Lungs: CTA bilaterally  Psych: pleasant and cooperative, no obvious depression or anxiety  Cognitive function grossly intact  See patient instructions for recommendations.  Education and counseling regarding the above review of health provided with a plan for the following: -see scanned patient completed form for further details -fall prevention strategies discussed  -healthy lifestyle discussed -importance and resources for completing advanced directives discussed -see patient instructions below for any other recommendations provided  4)The following written screening schedule of preventive measures were reviewed with assessment and plan made per below, orders and patient instructions:      AAA screening done if applicable     Alcohol screening done     Obesity Screening and counseling done     STI screening (Hep C if born 201745-65) offered and per pt wishes     Tobacco Screening done       Pneumococcal (PPSV23 -one dose after 64, one before if risk factors), influenza yearly and hepatitis B vaccines (if high risk - end stage renal disease, IV drugs, homosexual men, live in home for mentally retarded, hemophilia receiving factors) ASSESSMENT/PLAN: done       Screening mammograph (yearly if >40) ASSESSMENT/PLAN: advised and discussed risks/benefits - she declined      Screening Pap smear/pelvic exam (q2 years) ASSESSMENT/PLAN:  n/a, declined      Colorectal cancer screening (FOBT yearly or flex sig q4y or colonoscopy q10y or barium enema q4y) ASSESSMENT/PLAN: n/a, declined      Diabetes outpatient self-management training services ASSESSMENT/PLAN: utd or done      Bone mass measurements(covered q2y if indicated - estrogen def, osteoporosis, hyperparathyroid, vertebral abnormalities, osteoporosis or steroids) ASSESSMENT/PLAN: pt reports monitored by her rheumatologist.      Screening for glaucoma(q1y if high risk - diabetes, FH, AA and > 50 or hispanic and > 65) ASSESSMENT/PLAN: utd or advised      Medical nutritional therapy for individuals with diabetes or renal disease ASSESSMENT/PLAN: see orders      Cardiovascular screening blood tests (lipids q5y) ASSESSMENT/PLAN: see orders and labs      Diabetes screening tests ASSESSMENT/PLAN: see orders and labs   7.) Summary:  Medicare  annual wellness visit, subsequent  Essential hypertension  Pernicious anemia  POLYMYALGIA RHEUMATICA  Vitamin D deficiency  Osteoporosis  Hearing loss of both ears  Hyperglycemia - Plan: Hemoglobin A1c  Hyperlipemia - Plan: Lipid Panel  -lifestyle recs -continue compression -labs -risk factors and conditions per above assessment were discussed and treatment, recommendations and referrals were offered per documentation above and orders and patient instructions.  Patient Instructions  BEFORE YOU LEAVE: -follow up: 4-6 months -labs  Please consider continuing mammograms. Call the number provided. Do self breast exams monthly.  Vit D3 [phone] IU daily. Adequate intake calcium. Follow up with your rheumatologist about your bone health.  We recommend the following healthy lifestyle measures: - eat a healthy whole foods diet consisting of regular small meals composed of vegetables, fruits, beans, nuts, seeds, healthy meats such as white chicken and fish and whole grains.  - avoid sweets, white starchy foods,  fried foods, fast food, processed foods, sodas, red meet and other fattening foods.  - get a least 150-300 minutes of aerobic exercise per week.   We have ordered labs or studies at this visit. It can take up to 1-2 weeks for results and processing. IF results require follow up or explanation, we will call you with instructions. Clinically stable results will be released to your [facility]. If you have not heard from us or cannot find your results in [facility] in 2 weeks please contact our office at [phone].  If you are not yet signed up for [facility], please consider signing up.            Kriste BasqueKIM, [name], DO

## 2015-07-21 NOTE — Patient Instructions (Signed)
BEFORE YOU LEAVE: -follow up: 4-6 months -labs  Please consider continuing mammograms. Call the number provided. Do self breast exams monthly.  Vit D3 [phone] IU daily. Adequate intake calcium. Follow up with your rheumatologist about your bone health.  We recommend the following healthy lifestyle measures: - eat a healthy whole foods diet consisting of regular small meals composed of vegetables, fruits, beans, nuts, seeds, healthy meats such as white chicken and fish and whole grains.  - avoid sweets, white starchy foods, fried foods, fast food, processed foods, sodas, red meet and other fattening foods.  - get a least 150-300 minutes of aerobic exercise per week.   We have ordered labs or studies at this visit. It can take up to 1-2 weeks for results and processing. IF results require follow up or explanation, we will call you with instructions. Clinically stable results will be released to your [facility]. If you have not heard from us or cannot find your results in [facility] in 2 weeks please contact our office at [phone].  If you are not yet signed up for [facility], please consider signing up.

## 2015-07-22 ENCOUNTER — Telehealth: Payer: Self-pay | Admitting: Family Medicine

## 2015-07-22 NOTE — Telephone Encounter (Signed)
The patient seen Dr. Selena BattenKim 07/21/2015 and after the visit she was wanting to know to come back there and ask why she has these diagnosis on her AVS for Hyperglycemiaand Hyperlipemiaand she is not diabetic and doesn't have high cholesterol. She would like to have this explained to her.

## 2015-07-22 NOTE — Telephone Encounter (Signed)
I called the pt and discussed her most recent results with her.  I also advised her the cholesterol has been elevated before and this is what hyperlipidemia is.  I also advised her the glucose or sugar level has been elevated before and this is what hyperglycemia means and this is not the same as a diabetic and she agreed.

## 2015-10-10 ENCOUNTER — Ambulatory Visit (INDEPENDENT_AMBULATORY_CARE_PROVIDER_SITE_OTHER): Payer: Medicare Other | Admitting: *Deleted

## 2015-10-10 DIAGNOSIS — Z23 Encounter for immunization: Secondary | ICD-10-CM | POA: Diagnosis not present

## 2015-12-28 DIAGNOSIS — M25552 Pain in left hip: Secondary | ICD-10-CM | POA: Diagnosis not present

## 2015-12-28 DIAGNOSIS — Z79899 Other long term (current) drug therapy: Secondary | ICD-10-CM | POA: Diagnosis not present

## 2015-12-28 DIAGNOSIS — M353 Polymyalgia rheumatica: Secondary | ICD-10-CM | POA: Diagnosis not present

## 2015-12-28 DIAGNOSIS — M810 Age-related osteoporosis without current pathological fracture: Secondary | ICD-10-CM | POA: Diagnosis not present

## 2016-01-05 DIAGNOSIS — H35373 Puckering of macula, bilateral: Secondary | ICD-10-CM | POA: Diagnosis not present

## 2016-01-05 DIAGNOSIS — Z961 Presence of intraocular lens: Secondary | ICD-10-CM | POA: Diagnosis not present

## 2016-01-19 NOTE — Progress Notes (Signed)
HPI:  [name] LevansRebecca T [name] is a pleasant 82 yo with a PMH significant for HTN, HLD, PMR here for follow up. Reports she has been feeling well. Denies any chest pain, shortness of breath, swelling or headaches. Due for labs.  ROS: See pertinent positives and negatives per HPI.  Past Medical History:  Diagnosis Date  . Alcohol abuse   . Emphysema of lung (HCC) 01/26/2009   pt. unsure of this reports as of 07/31/2012  . Fracture of fifth metatarsal bone of right foot 08/29/2012  . Gout 08/11/2012  . Hyperlipemia 03/27/2007  . Hypertension   . Osteoporosis 07/12/2006  . Pernicious anemia 10/25/2006  . Polymyalgia rheumatica (HCC) 03/27/2007   sees Dr. Kellie Simmeringruslow in rheumatology  . Vitamin D deficiency 07/12/2006    Past Surgical History:  Procedure Laterality Date  . APPENDECTOMY  07/12/2006  . PARTIAL HIP ARTHROPLASTY     left side- 2x's ( 2nd surgery to correct 1st replacement), R side -   . TOOTH EXTRACTION N/A 08/04/2012   Procedure: EXTRACTIONS 17, 20;  Surgeon: Georgia LopesScott M Jensen, DDS;  Location: [facility] OR;  Service: Oral Surgery;  Laterality: N/A;    Family History  Problem Relation Age of Onset  . Mental illness Mother     alzheimer dementia  . Ulcers Father   . Heart disease Father   . Cancer Father     lung, smoker    Social History   Social History  . Marital status: Married    Spouse name: N/A  . Number of children: N/A  . Years of education: N/A   Social History Main Topics  . Smoking status: Former Smoker    Quit date: 07/12/2006  . Smokeless tobacco: None  . Alcohol use 6.0 oz/week    10 Shots of liquor per week     Comment: quit completely - 2012  . Drug use: No  . Sexual activity: No   Other Topics Concern  . None   Social History Narrative   Work or School: none      Home Situation: lives with husband - drives, husband takes care of finances, dresses herself, takes care of housework and cooking      Spiritual Beliefs: [employer]      Lifestyle: uses  cane when goes out, exercises sometimes - diet healthy              Current Outpatient Prescriptions:  .  alendronate (FOSAMAX) 70 MG tablet, Take 1 tablet (70 mg total) by mouth every 7 (seven) days., Disp: 12 tablet, Rfl: 3 .  CALCIUM PO, Take by mouth., Disp: , Rfl:  .  Cyanocobalamin (VITAMIN B-12) 1000 MCG SUBL, Place under the tongue., Disp: , Rfl:  .  hydrochlorothiazide (HYDRODIURIL) 12.5 MG tablet, TAKE ONE TABLET BY MOUTH ONCE DAILY, Disp: 90 tablet, Rfl: 1 .  predniSONE (DELTASONE) 5 MG tablet, Take 2.5 mg by mouth daily. , Disp: , Rfl:   EXAM:  Vitals:   01/20/16 0959  BP: (!) 100/58  Pulse: 66  Temp: 97.3 F (36.3 C)    Body mass index is 20.44 kg/m.  GENERAL: vitals reviewed and listed above, alert, oriented, appears well hydrated and in no acute distress  HEENT: atraumatic, conjunttiva clear, no obvious abnormalities on inspection of external nose and ears  NECK: no obvious masses on inspection  LUNGS: clear to auscultation bilaterally, no wheezes, rales or rhonchi, good air movement  CV: HRRR, no peripheral edema  MS: moves all extremities without noticeable  abnormality  PSYCH: pleasant and cooperative, no obvious depression or anxiety  ASSESSMENT AND PLAN:  Discussed the following assessment and plan:  Essential hypertension - Plan: Basic metabolic panel, CBC  Hyperlipidemia, unspecified hyperlipidemia type - Plan: Cholesterol, Total, HDL cholesterol  Pernicious anemia  Hyperglycemia  -doing well -not fasting -labs today, lifestyle recs -Patient advised to return or notify a doctor immediately if symptoms worsen or persist or new concerns arise.  Patient Instructions  BEFORE YOU LEAVE: -follow up: Medicare exam with Darl PikesSusan in 6 months -labs today  We have ordered labs or studies at this visit. It can take up to 1-2 weeks for results and processing. IF results require follow up or explanation, we will call you with instructions. Clinically  stable results will be released to your [facility]. If you have not heard from us or cannot find your results in [facility] in 2 weeks please contact our office at [phone].  If you are not yet signed up for [facility], please consider signing up.   We recommend the following healthy lifestyle for LIFE: 1) Small portions.   Tip: eat off of a salad plate instead of a dinner plate.  Tip: if you need more or a snack choose fruits, veggies and/or a handful of nuts or seeds.  2) Eat a healthy clean diet.  * Tip: Avoid (less then 1 serving per week): processed foods, sweets, sweetened drinks, white starches (rice, flour, bread, potatoes, pasta, etc), red meat, fast foods, butter  *Tip: CHOOSE instead   * 5-9 servings per day of fresh or frozen fruits and vegetables (but not corn, potatoes, bananas, canned or dried fruit)   *nuts and seeds, beans   *olives and olive oil   *small portions of lean meats such as fish and white chicken    *small portions of whole grains  3)Get at least 150 minutes of sweaty aerobic exercise per week.  4)Reduce stress - consider counseling, meditation and relaxation to balance other aspects of your life.          Kriste BasqueKIM, Kyre Jeffries R., DO

## 2016-01-20 ENCOUNTER — Ambulatory Visit (INDEPENDENT_AMBULATORY_CARE_PROVIDER_SITE_OTHER): Payer: Medicare Other | Admitting: Family Medicine

## 2016-01-20 ENCOUNTER — Encounter: Payer: Self-pay | Admitting: Family Medicine

## 2016-01-20 VITALS — BP 100/58 | HR 66 | Temp 97.3°F | Ht 61.0 in | Wt 108.2 lb

## 2016-01-20 DIAGNOSIS — E785 Hyperlipidemia, unspecified: Secondary | ICD-10-CM

## 2016-01-20 DIAGNOSIS — I10 Essential (primary) hypertension: Secondary | ICD-10-CM | POA: Diagnosis not present

## 2016-01-20 DIAGNOSIS — D510 Vitamin B12 deficiency anemia due to intrinsic factor deficiency: Secondary | ICD-10-CM

## 2016-01-20 DIAGNOSIS — R739 Hyperglycemia, unspecified: Secondary | ICD-10-CM | POA: Diagnosis not present

## 2016-01-20 LAB — BASIC METABOLIC PANEL
BUN: 13 mg/dL (ref 6–23)
CHLORIDE: 103 meq/L (ref 96–112)
CO2: 31 meq/L (ref 19–32)
CREATININE: 1.08 mg/dL (ref 0.40–1.20)
Calcium: 9.8 mg/dL (ref 8.4–10.5)
GFR: 51.15 mL/min — ABNORMAL LOW (ref >60.00)
Glucose, Bld: 105 mg/dL — ABNORMAL HIGH (ref 70–99)
Potassium: 4.7 mEq/L (ref 3.5–5.1)
Sodium: 142 mEq/L (ref 135–145)

## 2016-01-20 LAB — CBC
HEMATOCRIT: 41.3 % (ref 36.0–46.0)
Hemoglobin: 14.1 g/dL (ref 12.0–15.0)
MCHC: 34.2 g/dL (ref 30.0–36.0)
MCV: 97.7 fl (ref 78.0–100.0)
Platelets: 220 10*3/uL (ref 150.0–400.0)
RBC: 4.22 Mil/uL (ref 3.87–5.11)
RDW: 13.9 % (ref 11.5–15.5)
WBC: 9 10*3/uL (ref 4.0–10.5)

## 2016-01-20 LAB — HDL CHOLESTEROL: HDL: 92.2 mg/dL (ref >39.00)

## 2016-01-20 LAB — CHOLESTEROL, TOTAL: CHOLESTEROL: 187 mg/dL (ref 0–200)

## 2016-01-20 NOTE — Patient Instructions (Signed)
BEFORE YOU LEAVE: -follow up: Medicare exam with Darl PikesSusan in 6 months -labs today  We have ordered labs or studies at this visit. It can take up to 1-2 weeks for results and processing. IF results require follow up or explanation, we will call you with instructions. Clinically stable results will be released to your [facility]. If you have not heard from us or cannot find your results in [facility] in 2 weeks please contact our office at [phone].  If you are not yet signed up for [facility], please consider signing up.   We recommend the following healthy lifestyle for LIFE: 1) Small portions.   Tip: eat off of a salad plate instead of a dinner plate.  Tip: if you need more or a snack choose fruits, veggies and/or a handful of nuts or seeds.  2) Eat a healthy clean diet.  * Tip: Avoid (less then 1 serving per week): processed foods, sweets, sweetened drinks, white starches (rice, flour, bread, potatoes, pasta, etc), red meat, fast foods, butter  *Tip: CHOOSE instead   * 5-9 servings per day of fresh or frozen fruits and vegetables (but not corn, potatoes, bananas, canned or dried fruit)   *nuts and seeds, beans   *olives and olive oil   *small portions of lean meats such as fish and white chicken    *small portions of whole grains  3)Get at least 150 minutes of sweaty aerobic exercise per week.  4)Reduce stress - consider counseling, meditation and relaxation to balance other aspects of your life.

## 2016-01-20 NOTE — Progress Notes (Signed)
Pre visit review using our clinic review tool, if applicable. No additional management support is needed unless otherwise documented below in the visit note. 

## 2016-01-24 ENCOUNTER — Other Ambulatory Visit: Payer: Self-pay | Admitting: *Deleted

## 2016-01-24 MED ORDER — HYDROCHLOROTHIAZIDE 12.5 MG PO TABS: 12.5000 mg | ORAL_TABLET | Freq: Every day | ORAL | 2 refills | Status: DC

## 2016-01-24 NOTE — Telephone Encounter (Signed)
Rx done and the pt was informed. 

## 2016-04-26 DIAGNOSIS — H35373 Puckering of macula, bilateral: Secondary | ICD-10-CM | POA: Diagnosis not present

## 2016-04-26 DIAGNOSIS — H43813 Vitreous degeneration, bilateral: Secondary | ICD-10-CM | POA: Diagnosis not present

## 2016-06-01 ENCOUNTER — Telehealth: Payer: Self-pay | Admitting: Family Medicine

## 2016-06-01 NOTE — Telephone Encounter (Signed)
Called pt, left message - awv scheduled on 6/28 needs to be rescheduled because it is a few days early.  [name] PikesSusan is off the following week, so we need to reschedule it for the week of 7/10.

## 2016-07-19 ENCOUNTER — Ambulatory Visit: Payer: Medicare Other

## 2016-07-26 ENCOUNTER — Ambulatory Visit (INDEPENDENT_AMBULATORY_CARE_PROVIDER_SITE_OTHER): Payer: Medicare Other | Admitting: Family Medicine

## 2016-07-26 ENCOUNTER — Encounter: Payer: Self-pay | Admitting: Family Medicine

## 2016-07-26 VITALS — BP 122/78 | HR 68 | Temp 97.7°F | Ht 61.0 in

## 2016-07-26 DIAGNOSIS — M545 Low back pain, unspecified: Secondary | ICD-10-CM

## 2016-07-26 NOTE — Progress Notes (Signed)
HPI:  Acute visit for back pain: -started 3 days ago -symptoms R SI joint region pain, improving some -pain mainly with going to get up - better once moving -not taking anything for pain -denies fevers, malaise, urinary or bowel incontinence, weakness, numbness or trauma -? Radiating a little to R buttock  ROS: See pertinent positives and negatives per HPI.  Past Medical History:  Diagnosis Date  . Alcohol abuse   . Emphysema of lung (HCC) 01/26/2009   pt. unsure of this reports as of 07/31/2012  . Fracture of fifth metatarsal bone of right foot 08/29/2012  . Gout 08/11/2012  . Hyperlipemia 03/27/2007  . Hypertension   . Osteoporosis 07/12/2006  . Pernicious anemia 10/25/2006  . Polymyalgia rheumatica (HCC) 03/27/2007   sees Dr. Kellie Simmering in rheumatology  . Vitamin D deficiency 07/12/2006    Past Surgical History:  Procedure Laterality Date  . APPENDECTOMY  07/12/2006  . PARTIAL HIP ARTHROPLASTY     left side- 2x's ( 2nd surgery to correct 1st replacement), R side -   . TOOTH EXTRACTION N/A 08/04/2012   Procedure: EXTRACTIONS 17, 20;  Surgeon: Georgia Lopes, DDS;  Location: [facility] OR;  Service: Oral Surgery;  Laterality: N/A;    Family History  Problem Relation Age of Onset  . Mental illness Mother        alzheimer dementia  . Ulcers Father   . Heart disease Father   . Cancer Father        lung, smoker    Social History   Social History  . Marital status: Married    Spouse name: N/A  . Number of children: N/A  . Years of education: N/A   Social History Main Topics  . Smoking status: Former Smoker    Quit date: 07/12/2006  . Smokeless tobacco: Never Used  . Alcohol use 6.0 oz/week    10 Shots of liquor per week     Comment: quit completely - 2012  . Drug use: No  . Sexual activity: No   Other Topics Concern  . None   Social History Narrative   Work or School: none      Home Situation: lives with husband - drives, husband takes care of finances, dresses herself,  takes care of housework and cooking      Spiritual Beliefs: [employer]      Lifestyle: uses cane when goes out, exercises sometimes - diet healthy              Current Outpatient Prescriptions:  .  alendronate (FOSAMAX) 70 MG tablet, Take 1 tablet (70 mg total) by mouth every 7 (seven) days., Disp: 12 tablet, Rfl: 3 .  CALCIUM PO, Take by mouth., Disp: , Rfl:  .  Cyanocobalamin (VITAMIN B-12) 1000 MCG SUBL, Place under the tongue., Disp: , Rfl:  .  hydrochlorothiazide (HYDRODIURIL) 12.5 MG tablet, Take 1 tablet (12.5 mg total) by mouth daily., Disp: 90 tablet, Rfl: 2 .  predniSONE (DELTASONE) 5 MG tablet, Take 2.5 mg by mouth daily. , Disp: , Rfl:   EXAM:  Vitals:   07/26/16 0936  BP: 122/78  Pulse: 68  Temp: 97.7 F (36.5 C)    There is no height or weight on file to calculate BMI.  GENERAL: vitals reviewed and listed above, alert, oriented, appears well hydrated and in no acute distress  HEENT: atraumatic, conjunttiva clear, no obvious abnormalities on inspection of external nose and ears  NECK: no obvious masses on inspection  LUNGS:  clear to auscultation bilaterally, no wheezes, rales or rhonchi, good air movement  CV: HRRR, no peripheral edema  MS: walking with cane, gait is cautious, slow to get out of chair and onto table No bony TTP Soft tissue TTP WU:[id number]:tenderness to palpation right SI joint region and right gluteus muscles -/+ tests: neg trendelenburg,-facet loading, -SLRT, -CLRT, -FABER, +FADIR R Normal muscle strength, sensation to light touch and DTRs in LEs bilaterally  PSYCH: pleasant and cooperative, no obvious depression or anxiety  ASSESSMENT AND PLAN:  Discussed the following assessment and plan:  Acute right-sided low back pain without sciatica  -we discussed possible serious and likely etiologies, workup and treatment, treatment risks and return precautions - query strain SI, sacroiliitis vs other -after this discussion, Lurena JoinerRebecca opted for  HEP, topical menthol/heat and or tyelnol prn -declined PT -follow up advised 3-4 weeks same day as AWV -of course, we advised Lurena JoinerRebecca  to return or notify a doctor immediately if symptoms worsen or persist or new concerns arise.   Patient Instructions  BEFORE YOU LEAVE: -SI joint exercises -follow up: 1 month  Do the exercises as able 4 days per week.  Tiger balm (menthol) or tylenol per instructions as needed for pain. Heat can help also.  I hope you are feeling better soon! Seek care sonner if worsening, new concerns or you are not improving with treatment.      Kriste BasqueKIM, Jayquon Theiler R., DO

## 2016-07-26 NOTE — Patient Instructions (Signed)
BEFORE YOU LEAVE: -SI joint exercises -follow up: 1 month  Do the exercises as able 4 days per week.  Tiger balm (menthol) or tylenol per instructions as needed for pain. Heat can help also.  I hope you are feeling better soon! Seek care sonner if worsening, new concerns or you are not improving with treatment.

## 2016-08-01 ENCOUNTER — Ambulatory Visit: Payer: Medicare Other

## 2016-08-13 ENCOUNTER — Other Ambulatory Visit: Payer: Self-pay

## 2016-08-29 NOTE — Progress Notes (Signed)
HPI:  Follow up back pain: -seen 07/26/16 for 3 days R SI jt region pain - opted for conservative tx with HEP, tylenol topical menthol/heat -reports: She was doing better for several weeks, however she rarely and her daughter's truck and strained this area again several days ago and has had moderate to severe pain in the right SI joint region again -denies: Radiation, fevers, malaise, weakness or numbness in the lower extremities  Sees [name] today for AWV.   ROS: See pertinent positives and negatives per HPI.  Past Medical History:  Diagnosis Date  . Alcohol abuse   . Emphysema of lung ([place]) 01/26/2009   pt. unsure of this reports as of 07/31/2012  . Fracture of fifth metatarsal bone of right foot 08/29/2012  . Gout 08/11/2012  . Hyperlipemia 03/27/2007  . Hypertension   . Osteoporosis 07/12/2006  . Pernicious anemia 10/25/2006  . Polymyalgia rheumatica ([place]) 03/27/2007   sees Dr. Charlestine Night in rheumatology  . Vitamin D deficiency 07/12/2006    Past Surgical History:  Procedure Laterality Date  . APPENDECTOMY  07/12/2006  . PARTIAL HIP ARTHROPLASTY     left side- 2x's ( 2nd surgery to correct 1st replacement), R side -   . TOOTH EXTRACTION N/A 08/04/2012   Procedure: EXTRACTIONS 17, 20;  Surgeon: Gae Bon, DDS;  Location: [facility];  Service: Oral Surgery;  Laterality: N/A;    Family History  Problem Relation Age of Onset  . Mental illness Mother        alzheimer dementia  . Ulcers Father   . Heart disease Father   . Cancer Father        lung, smoker    Social History   Social History  . Marital status: Married    Spouse name: N/A  . Number of children: N/A  . Years of education: N/A   Social History Main Topics  . Smoking status: Former Smoker    Packs/day: 2.00    Years: 58.00    Start date: 01/23/1946    Quit date: 07/12/2006  . Smokeless tobacco: Never Used  . Alcohol use 6.0 oz/week    10 Shots of liquor per week     Comment: quit completely - 2012  . Drug use: No    . Sexual activity: No   Other Topics Concern  . None   Social History Narrative   Work or School: none      Home Situation: lives with husband - drives, husband takes care of finances, dresses herself, takes care of housework and cooking      Spiritual Beliefs: [employer]      Lifestyle: uses cane when goes out, exercises sometimes - diet healthy              Current Outpatient Prescriptions:  .  alendronate (FOSAMAX) 70 MG tablet, Take 1 tablet (70 mg total) by mouth every 7 (seven) days., Disp: 12 tablet, Rfl: 3 .  Ascorbic Acid (VITAMIN C PO), Take 500 mg by mouth daily., Disp: , Rfl:  .  CALCIUM PO, Take by mouth., Disp: , Rfl:  .  Cholecalciferol (VITAMIN D PO), Take 4,000 Units by mouth daily., Disp: , Rfl:  .  Cyanocobalamin (VITAMIN B-12) 1000 MCG SUBL, Place under the tongue., Disp: , Rfl:  .  hydrochlorothiazide (HYDRODIURIL) 12.5 MG tablet, Take 1 tablet (12.5 mg total) by mouth daily., Disp: 90 tablet, Rfl: 2 .  predniSONE (DELTASONE) 5 MG tablet, Take 2.5 mg by mouth daily. , Disp: ,  Rfl:   EXAM:  Vitals:   08/30/16 0945  BP: 138/78  Pulse: 72  Temp: 97.9 F (36.6 C)    Body mass index is 20.03 kg/m.  GENERAL: vitals reviewed and listed above, alert, oriented, appears well hydrated and in no acute distress  HEENT: atraumatic, conjunttiva clear, no obvious abnormalities on inspection of external nose and ears  NECK: no obvious masses on inspection  LUNGS: clear to auscultation bilaterally, no wheezes, rales or rhonchi, good air movement  CV: HRRR, no peripheral edema  MS: moves all extremities without noticeable abnormality Normal Gait Normal inspection of back, no obvious scoliosis or leg length descrepancy No bony TTP. Tenderness to palpation is focal and is at the right SI joint. Soft tissue TTP at: See above, no other appreciable tenderness to palpation on exam today -/+ tests: neg trendelenburg,-facet loading, -SLRT, -CLRT, +FABER R,  -FADIR Normal muscle strength, sensation to light touch and DTRs in LEs bilaterally  PSYCH: pleasant and cooperative, no obvious depression or anxiety  ASSESSMENT AND PLAN:  Discussed the following assessment and plan:  Pain of right sacroiliac joint - Plan: DG Si Joints  -we discussed possible serious and likely etiologies, workup and treatment, treatment risks and return precautions -after this discussion, Princessa opted for starting with plain films of this region, possible MRI or referral to specialist, possible steroid injection was specialist or oral steroid -follow up advised pending evaluation -of course, we advised Mi  to return or notify a doctor immediately if symptoms worsen or persist or new concerns arise.  Patient Instructions    Ms. Bretado , Thank you for taking time to come for your Medicare Wellness Visit. I appreciate your ongoing commitment to your health goals. Please review the following plan we discussed and let me know if I can assist you in the future.   Educated to check with insurance regarding coverage of Shingles vaccination on Part D or Part B and may have lower co-pay if provided on the Part D side Shingrix is a vaccine for the prevention of Shingles in Adults 71 and older.  If you are on Medicare, you can request a prescription from your doctor to be filled at a pharmacy.  Please check with your benefits regarding applicable copays or out of pocket expenses.  The Shingrix is given in 2 vaccines approx 8 weeks apart. You must receive the 2nd dose prior to 6 months from receipt of the first.   Will take the flu vaccine   Deaf & Hard of Hearing Division Services - can assist with hearing aid x 1  No reviews  [address] Office  [address]  [phone] Call this number for more information about getting a hearing screen and fup     These are the goals we discussed: Goals    None      This is a list of the screening  recommended for you and due dates:  Health Maintenance  Topic Date Due  . Flu Shot  08/22/2016  . Tetanus Vaccine  10/14/2017  . DEXA scan (bone density measurement)  Addressed  . Pneumonia vaccines  Completed    Manufacturing engineer of Services Cost  A Matter of Balance Class locations vary. Call [place] on Aging for more information.  [email or web address] [phone] 8-Session program addressing the fear of falling and increasing activity levels of older adults Free to minimal cost  A.C.T. By Dedra Skeens [address]  [address], [place], [place] [address].  [email or web address] [phone]  Personal training, gym, classes including Silver Sneakers* and ACTion for Aging Adults Fee-based  A.H.O.Y. (Add Health to [place]) Airs on Time [employer] 13, M-F at [place]: [employer],  [place] [facility] [place],  [place], [facility], [address] [facility], [place], [place], [place] [address]  [place] Location: Sharrell Ku. [employer] [place]      [phone]  [phone]  [phone]  [phone]  [phone]  [phone]  [phone]  [phone]  [phone]  [phone]    [phone] A total-body conditioning class for adults 77 and older; designed to increase muscular strength, endurance, range of movement, flexibility, balance, agility and coordination Free  [facility] [place], [place] [address] [place]      [address]      [phone]      Pilate's class  for individualsreturning to exercise after an injury, before or after surgery or for individuals with complex musculoskeletal issues; designed to improve strength, balance , flexibility      $15/class  [place] [address], [place] [address] [email or web address] [place] classes for beginners to advanced [place], [place] [address] [email or web address] [place] Chair Exercises Free, ages 7 and older; Ages 35-59 fee based  Marvia Pickles, [facility] [address] [place], [place] [address] Seniorcenter_1 .Beverlee Nims [phone]  A.H.O.Y. Tai Chi Fee-based Donation based or free  [place] Class locations vary.  Call or email Angela Burke or view website for more information. [email or web address] [email or web address] [phone] Ongoing classes at local YMCAs and gyms Fee-based  Silver Sneakers A.C.T. By [place] [employer]: [place] [facility] [place] [phone]  [phone] Classes designed for older adults who want to improve their strength, flexibility, balance and endurance.   Silver sneakers is covered by some insurance plans and includes a fitness center membership at participating locations. Find out more by calling [phone] or visiting [email or web address] Covered by some insurance plans  [facility] [place] [phone] A.H.O.Y.,  fitness room, personal training, fitness classes for injury prevention, strength, balance, flexibility, water fitness classes Ages 55+: $42 for 6 months; Ages 55-54: $34 for 6 months  Tai Chi for Everybody [facility] [address] [place], [place] [address] Taichiforeverybody_3 .Patsi Sears [phone] Tai Chi classes for beginners to advanced; geared for seniors Donation Based      [employer] ([place] Others Participate in Exercise     [place] Gambler. Rosana Hoes, PhD, [place] [email or web address] [place]     [phone]     A comprehensive fitness program for adults.  The program paris senior-level undergraduates Kinesiology students with adults who desire to learn how to exercise  safely.  Includes a structural exercise class focusing on functional fitnesss     $100/semester in fall and spring; $75 in summer (no trainers)    *Silver Sneakers is covered by some Personal assistant and includes a  Radio producer at participating locations.  Find out more by calling [phone] or visiting [email or web address]  For additional health and human services resources for senior adults, please contact SeniorLine at [phone] in [place] and [place] at [phone] in all other areas.  Falls can be the main reason people lose their independence Think before you CLIMB  4 things you can do to prevent falls 1. Begin an exercise program to improve your leg strength and balance 2. Ask the doctor to review medicines for fall risk 3. Get an annual eye check up and update your eye glasses;  (the Lion's club still assist with eyewear:  Reviewed for annual vision exam;[facility] assistance for eyewear is coordinated through [facility]; Please call Cherlyn Labella at [phone] 4. Make your home safer by: Removing clutter and tripping hazzards Putting railing on stairs and adding grab bars to the bathroom  Have good lighting; especially on stairs and at night when getting up to the  bathroom   Exercise; including walking can assist with maintaining tone and balance and help prevent falls as you age.     Health Maintenance for Postmenopausal Women Menopause is a normal process in which your reproductive ability comes to an end. This process happens gradually over a span of months to years, usually between the ages of 67 and 75. Menopause is complete when you have missed 12 consecutive menstrual periods. It is important to talk with your health care provider about some of the most common conditions that affect postmenopausal women, such as heart disease, cancer, and bone loss (osteoporosis). Adopting a healthy lifestyle and getting preventive care can help to promote your health and wellness. Those actions can also lower your chances of developing some of these common conditions. What should I know about menopause? During menopause, you may experience a number of symptoms, such as:  Moderate-to-severe hot flashes.  Night sweats.  Decrease in sex drive.  Mood swings.  Headaches.  Tiredness.  Irritability.  Memory problems.  Insomnia.  Choosing to treat or not to treat menopausal changes is an individual decision that you make with your health care provider. What should I know about hormone replacement therapy and supplements? Hormone therapy products are effective for treating symptoms that are associated with menopause, such as hot flashes and night sweats. Hormone replacement carries certain risks, especially as you become older. If you are thinking about using estrogen or estrogen with progestin treatments, discuss the benefits and risks with your health care provider. What should I know about heart disease and stroke? Heart disease, heart attack, and stroke become more likely as you age. This may be due, in part, to the hormonal changes that your body experiences during menopause. These can affect how your body processes dietary fats, triglycerides, and  cholesterol. Heart attack and stroke are both medical emergencies. There are many things that you can do to help prevent heart disease and stroke:  Have your blood pressure checked at least every 1-2 years. High blood pressure causes heart disease and increases the risk of stroke.  If you are 57-59 years old, ask your health care provider if you should take aspirin to prevent a heart attack or a stroke.  Do not use any tobacco products, including cigarettes, chewing tobacco, or  electronic cigarettes. If you need help quitting, ask your health care provider.  It is important to eat a healthy diet and maintain a healthy weight. ? Be sure to include plenty of vegetables, fruits, low-fat dairy products, and lean protein. ? Avoid eating foods that are high in solid fats, added sugars, or salt (sodium).  Get regular exercise. This is one of the most important things that you can do for your health. ? Try to exercise for at least 150 minutes each week. The type of exercise that you do should increase your heart rate and make you sweat. This is known as moderate-intensity exercise. ? Try to do strengthening exercises at least twice each week. Do these in addition to the moderate-intensity exercise.  Know your numbers.Ask your health care provider to check your cholesterol and your blood glucose. Continue to have your blood tested as directed by your health care provider.  What should I know about cancer screening? There are several types of cancer. Take the following steps to reduce your risk and to catch any cancer development as early as possible. Breast Cancer  Practice breast self-awareness. ? This means understanding how your breasts normally appear and feel. ? It also means doing regular breast self-exams. Let your health care provider know about any changes, no matter how small.  If you are 46 or older, have a clinician do a breast exam (clinical breast exam or CBE) every year. Depending  on your age, family history, and medical history, it may be recommended that you also have a yearly breast X-ray (mammogram).  If you have a family history of breast cancer, talk with your health care provider about genetic screening.  If you are at high risk for breast cancer, talk with your health care provider about having an MRI and a mammogram every year.  Breast cancer (BRCA) gene test is recommended for women who have family members with BRCA-related cancers. Results of the assessment will determine the need for genetic counseling and BRCA1 and for BRCA2 testing. BRCA-related cancers include these types: ? Breast. This occurs in males or females. ? Ovarian. ? Tubal. This may also be called fallopian tube cancer. ? Cancer of the abdominal or pelvic lining (peritoneal cancer). ? Prostate. ? Pancreatic.  Cervical, Uterine, and Ovarian Cancer Your health care provider may recommend that you be screened regularly for cancer of the pelvic organs. These include your ovaries, uterus, and vagina. This screening involves a pelvic exam, which includes checking for microscopic changes to the surface of your cervix (Pap test).  For women ages 21-65, health care providers may recommend a pelvic exam and a Pap test every three years. For women ages 85-65, they may recommend the Pap test and pelvic exam, combined with testing for human papilloma virus (HPV), every five years. Some types of HPV increase your risk of cervical cancer. Testing for HPV may also be done on women of any age who have unclear Pap test results.  Other health care providers may not recommend any screening for nonpregnant women who are considered low risk for pelvic cancer and have no symptoms. Ask your health care provider if a screening pelvic exam is right for you.  If you have had past treatment for cervical cancer or a condition that could lead to cancer, you need Pap tests and screening for cancer for at least 20 years after  your treatment. If Pap tests have been discontinued for you, your risk factors (such as having a new sexual  partner) need to be reassessed to determine if you should start having screenings again. Some women have medical problems that increase the chance of getting cervical cancer. In these cases, your health care provider may recommend that you have screening and Pap tests more often.  If you have a family history of uterine cancer or ovarian cancer, talk with your health care provider about genetic screening.  If you have vaginal bleeding after reaching menopause, tell your health care provider.  There are currently no reliable tests available to screen for ovarian cancer.  Lung Cancer Lung cancer screening is recommended for adults 17-15 years old who are at high risk for lung cancer because of a history of smoking. A yearly low-dose CT scan of the lungs is recommended if you:  Currently smoke.  Have a history of at least 30 pack-years of smoking and you currently smoke or have quit within the past 15 years. A pack-year is smoking an average of one pack of cigarettes per day for one year.  Yearly screening should:  Continue until it has been 15 years since you quit.  Stop if you develop a health problem that would prevent you from having lung cancer treatment.  Colorectal Cancer  This type of cancer can be detected and can often be prevented.  Routine colorectal cancer screening usually begins at age 39 and continues through age 55.  If you have risk factors for colon cancer, your health care provider may recommend that you be screened at an earlier age.  If you have a family history of colorectal cancer, talk with your health care provider about genetic screening.  Your health care provider may also recommend using home test kits to check for hidden blood in your stool.  A small camera at the end of a tube can be used to examine your colon directly (sigmoidoscopy or colonoscopy).  This is done to check for the earliest forms of colorectal cancer.  Direct examination of the colon should be repeated every 5-10 years until age 56. However, if early forms of precancerous polyps or small growths are found or if you have a family history or genetic risk for colorectal cancer, you may need to be screened more often.  Skin Cancer  Check your skin from head to toe regularly.  Monitor any moles. Be sure to tell your health care provider: ? About any new moles or changes in moles, especially if there is a change in a mole's shape or color. ? If you have a mole that is larger than the size of a pencil eraser.  If any of your family members has a history of skin cancer, especially at a young age, talk with your health care provider about genetic screening.  Always use sunscreen. Apply sunscreen liberally and repeatedly throughout the day.  Whenever you are outside, protect yourself by wearing long sleeves, pants, a wide-brimmed hat, and sunglasses.  What should I know about osteoporosis? Osteoporosis is a condition in which bone destruction happens more quickly than new bone creation. After menopause, you may be at an increased risk for osteoporosis. To help prevent osteoporosis or the bone fractures that can happen because of osteoporosis, the following is recommended:  If you are 18-24 years old, get at least 1,000 mg of calcium and at least 600 mg of vitamin D per day.  If you are older than age 22 but younger than age 71, get at least 1,200 mg of calcium and at least 600 mg of  vitamin D per day.  If you are older than age 46, get at least 1,200 mg of calcium and at least 800 mg of vitamin D per day.  Smoking and excessive alcohol intake increase the risk of osteoporosis. Eat foods that are rich in calcium and vitamin D, and do weight-bearing exercises several times each week as directed by your health care provider. What should I know about how menopause affects my mental  health? Depression may occur at any age, but it is more common as you become older. Common symptoms of depression include:  Low or sad mood.  Changes in sleep patterns.  Changes in appetite or eating patterns.  Feeling an overall lack of motivation or enjoyment of activities that you previously enjoyed.  Frequent crying spells.  Talk with your health care provider if you think that you are experiencing depression. What should I know about immunizations? It is important that you get and maintain your immunizations. These include:  Tetanus, diphtheria, and pertussis (Tdap) booster vaccine.  Influenza every year before the flu season begins.  Pneumonia vaccine.  Shingles vaccine.  Your health care provider may also recommend other immunizations. This information is not intended to replace advice given to you by your health care provider. Make sure you discuss any questions you have with your health care provider. Document Released: 03/02/2005 Document Revised: 07/29/2015 Document Reviewed: 10/12/2014 Elsevier Interactive Patient Education  2018 [place] DO

## 2016-08-30 ENCOUNTER — Encounter: Payer: Self-pay | Admitting: Family Medicine

## 2016-08-30 ENCOUNTER — Ambulatory Visit (INDEPENDENT_AMBULATORY_CARE_PROVIDER_SITE_OTHER): Payer: Medicare Other | Admitting: Family Medicine

## 2016-08-30 ENCOUNTER — Ambulatory Visit (INDEPENDENT_AMBULATORY_CARE_PROVIDER_SITE_OTHER)
Admission: RE | Admit: 2016-08-30 | Discharge: 2016-08-30 | Disposition: A | Discharge status: Home / Self Care | Payer: Medicare Other | Source: Ambulatory Visit | Attending: Family Medicine | Admitting: Family Medicine

## 2016-08-30 VITALS — BP 138/78 | HR 72 | Temp 97.9°F | Ht 61.0 in | Wt 106.0 lb

## 2016-08-30 DIAGNOSIS — M533 Sacrococcygeal disorders, not elsewhere classified: Secondary | ICD-10-CM | POA: Diagnosis not present

## 2016-08-30 DIAGNOSIS — Z0001 Encounter for general adult medical examination with abnormal findings: Secondary | ICD-10-CM | POA: Diagnosis not present

## 2016-08-30 DIAGNOSIS — Z471 Aftercare following joint replacement surgery: Secondary | ICD-10-CM | POA: Diagnosis not present

## 2016-08-30 DIAGNOSIS — Z96643 Presence of artificial hip joint, bilateral: Secondary | ICD-10-CM | POA: Diagnosis not present

## 2016-08-30 NOTE — Patient Instructions (Addendum)
[name] , Thank you for taking time to come for your Medicare Wellness Visit. I appreciate your ongoing commitment to your health goals. Please review the following plan we discussed and let me know if I can assist you in the future.   Educated to check with insurance regarding coverage of Shingles vaccination on Part D or Part B and may have lower co-pay if provided on the Part D side Shingrix is a vaccine for the prevention of Shingles in Adults 53 and older.  If you are on Medicare, you can request a prescription from your doctor to be filled at a pharmacy.  Please check with your benefits regarding applicable copays or out of pocket expenses.  The Shingrix is given in 2 vaccines approx 8 weeks apart. You must receive the 2nd dose prior to 6 months from receipt of the first.   Will take the flu vaccine   Deaf & Hard of Hearing Division Services - can assist with hearing aid x 1  No reviews  [address] Office  [place] [phone]  [phone] Call this number for more information about getting a hearing screen and fup     These are the goals we discussed:Agreed to try the pool if her hip pain is resolved  Family lives near and can assist her in a pool  Goals    None      This is a list of the screening recommended for you and due dates:  Health Maintenance  Topic Date Due  . Flu Shot  08/22/2016  . Tetanus Vaccine  10/14/2017  . DEXA scan (bone density measurement)  Addressed  . Pneumonia vaccines  Completed    Manufacturing engineer of Services Cost  A Matter of Balance Class locations vary. Call [place] on Aging for more information.  [email or web address]-[email or web address]/ [phone] 8-Session program addressing the fear of falling and increasing activity levels of older adults Free to minimal cost  A.C.T. By [employer] [address] [place], [place] [address].  BetaBlues.dk [phone]  Personal  training, gym, classes including Silver Sneakers* and ACTion for Aging Adults Fee-based  A.H.O.Y. (Add Health to Gerber) Airs on Time [employer] 13, M-F at [place]: [employer],  [place] [facility] [place],  [place], [place] [facility], [address] [facility], [place], [place], Jessie [address]  [place] Location: Sharrell Ku. [employer] [place]      [phone]  [phone]  [phone]  [phone]  [phone]  [phone]  [phone]  [phone]  [phone]  [phone]    [phone] A total-body conditioning class for adults 6 and older; designed to increase muscular strength, endurance, range of movement, flexibility, balance, agility and coordination Free  [facility] [place], [place] [address] [place]      [address]      [phone]      Pilate's class for individualsreturning to exercise after an injury, before or after surgery or for individuals with complex musculoskeletal issues; designed to improve strength, balance , flexibility      $  [place] [address] [place], [place] [address] [email or web address] [place] classes for beginners to advanced [place], [place] [address] [email or web address] Wells Chair Exercises Free, ages 47 and older; Ages  37-59 fee based  Marvia Pickles, [facility] [address] [place], [place] [address] Seniorcenter_1 .Beverlee Nims [phone]  A.H.O.Y. Tai Chi Fee-based Donation based or free  [place] Class locations vary.  Call or email Angela Burke or view website for more information. Info_[email or web address] .com GainPain.com.[email or web address] [phone] Ongoing classes at local YMC[employer] and gyms Fee-based  Silver Sneakers A.C.T. By [place] [employer]: [place] [phone]  [phone] Classes designed for older adults who want to improve their strength, flexibility, balance and endurance.   Silver sneakers is covered by some insurance plans and includes a fitness center membership at participating locations. Find out more by calling [phone] or visiting [email or web address] Covered by some insurance plans  [facility] [place] [phone] A.H.O.Y., fitness room, personal training, fitness classes for injury prevention, strength, balance, flexibility, water fitness classes Ages 55+: $60 for 6 months; Ages 4-54: $74 for 6 months  Tai Chi for Everybody [facility] [address] [place] [place], [place] [address] Taichiforeverybody_3 .Patsi Sears [phone] Tai Chi classes for beginners to advanced; geared for seniors Donation Based      [employer] (Helpling Others Participate in Exercise     Loyal Gambler. Rosana Hoes, PhD, [place] [email or web address] [place]     [phone]     A comprehensive fitness program for adults.  The program paris senior-level undergraduates Kinesiology students  with adults who desire to learn how to exercise safely.  Includes a structural exercise class focusing on functional fitnesss     $100/semester in fall and spring; $75 in summer (no trainers)    *Silver Sneakers is covered by some Personal assistant and includes a  Radio producer at participating locations.  Find out more by calling [phone] or visiting [email or web address]  For additional health and human services resources for senior adults, please contact SeniorLine at [phone] in [place] and [place] at [phone] in all other areas.  Falls can be the main reason people lose their independence Think before you CLIMB  4 things you can do to prevent falls 1. Begin an exercise program to improve your leg strength and balance 2. Ask the doctor to review medicines for fall risk 3. Get an annual eye check up and update your eye glasses;  (the [employer] club still assist with eyewear:  Reviewed for annual vision exam;The [place] assistance for eyewear is coordinated through [facility]; Please call Cherlyn Labella at [phone] 4. Make your home safer by: Removing clutter and tripping hazzards Putting railing on stairs and adding grab bars to the bathroom  Have good lighting; especially on stairs and at night when getting up to the bathroom   Exercise; including walking can assist with maintaining tone and balance and help prevent falls as you age.     Health Maintenance for Postmenopausal Women Menopause is a normal process in which your reproductive ability comes to an end. This process happens gradually  over a span of months to years, usually between the ages of 48 and 55. Menopause is complete when you have missed 12 consecutive menstrual periods. It is important to talk with your health care provider about some of the most common conditions that affect postmenopausal women, such as heart disease, cancer, and bone loss (osteoporosis). Adopting a healthy lifestyle and getting  preventive care can help to promote your health and wellness. Those actions can also lower your chances of developing some of these common conditions. What should I know about menopause? During menopause, you may experience a number of symptoms, such as:  Moderate-to-severe hot flashes.  Night sweats.  Decrease in sex drive.  Mood swings.  Headaches.  Tiredness.  Irritability.  Memory problems.  Insomnia.  Choosing to treat or not to treat menopausal changes is an individual decision that you make with your health care provider. What should I know about hormone replacement therapy and supplements? Hormone therapy products are effective for treating symptoms that are associated with menopause, such as hot flashes and night sweats. Hormone replacement carries certain risks, especially as you become older. If you are thinking about using estrogen or estrogen with progestin treatments, discuss the benefits and risks with your health care provider. What should I know about heart disease and stroke? Heart disease, heart attack, and stroke become more likely as you age. This may be due, in part, to the hormonal changes that your body experiences during menopause. These can affect how your body processes dietary fats, triglycerides, and cholesterol. Heart attack and stroke are both medical emergencies. There are many things that you can do to help prevent heart disease and stroke:  Have your blood pressure checked at least every 1-2 years. High blood pressure causes heart disease and increases the risk of stroke.  If you are 55-79 years old, ask your health care provider if you should take aspirin to prevent a heart attack or a stroke.  Do not use any tobacco products, including cigarettes, chewing tobacco, or electronic cigarettes. If you need help quitting, ask your health care provider.  It is important to eat a healthy diet and maintain a healthy weight. ? Be sure to include plenty of  vegetables, fruits, low-fat dairy products, and lean protein. ? Avoid eating foods that are high in solid fats, added sugars, or salt (sodium).  Get regular exercise. This is one of the most important things that you can do for your health. ? Try to exercise for at least 150 minutes each week. The type of exercise that you do should increase your heart rate and make you sweat. This is known as moderate-intensity exercise. ? Try to do strengthening exercises at least twice each week. Do these in addition to the moderate-intensity exercise.  Know your numbers.Ask your health care provider to check your cholesterol and your blood glucose. Continue to have your blood tested as directed by your health care provider.  What should I know about cancer screening? There are several types of cancer. Take the following steps to reduce your risk and to catch any cancer development as early as possible. Breast Cancer  Practice breast self-awareness. ? This means understanding how your breasts normally appear and feel. ? It also means doing regular breast self-exams. Let your health care provider know about any changes, no matter how small.  If you are 40 or older, have a clinician do a breast exam (clinical breast exam or CBE) every year. Depending on your age, family history,   and medical history, it may be recommended that you also have a yearly breast X-ray (mammogram).  If you have a family history of breast cancer, talk with your health care provider about genetic screening.  If you are at high risk for breast cancer, talk with your health care provider about having an MRI and a mammogram every year.  Breast cancer (BRCA) gene test is recommended for women who have family members with BRCA-related cancers. Results of the assessment will determine the need for genetic counseling and BRCA1 and for BRCA2 testing. BRCA-related cancers include these types: ? Breast. This occurs in males or  females. ? Ovarian. ? Tubal. This may also be called fallopian tube cancer. ? Cancer of the abdominal or pelvic lining (peritoneal cancer). ? Prostate. ? Pancreatic.  Cervical, Uterine, and Ovarian Cancer Your health care provider may recommend that you be screened regularly for cancer of the pelvic organs. These include your ovaries, uterus, and vagina. This screening involves a pelvic exam, which includes checking for microscopic changes to the surface of your cervix (Pap test).  For women ages 21-65, health care providers may recommend a pelvic exam and a Pap test every three years. For women ages 87-65, they may recommend the Pap test and pelvic exam, combined with testing for human papilloma virus (HPV), every five years. Some types of HPV increase your risk of cervical cancer. Testing for HPV may also be done on women of any age who have unclear Pap test results.  Other health care providers may not recommend any screening for nonpregnant women who are considered low risk for pelvic cancer and have no symptoms. Ask your health care provider if a screening pelvic exam is right for you.  If you have had past treatment for cervical cancer or a condition that could lead to cancer, you need Pap tests and screening for cancer for at least 20 years after your treatment. If Pap tests have been discontinued for you, your risk factors (such as having a new sexual partner) need to be reassessed to determine if you should start having screenings again. Some women have medical problems that increase the chance of getting cervical cancer. In these cases, your health care provider may recommend that you have screening and Pap tests more often.  If you have a family history of uterine cancer or ovarian cancer, talk with your health care provider about genetic screening.  If you have vaginal bleeding after reaching menopause, tell your health care provider.  There are currently no reliable tests available  to screen for ovarian cancer.  Lung Cancer Lung cancer screening is recommended for adults 61-20 years old who are at high risk for lung cancer because of a history of smoking. A yearly low-dose CT scan of the lungs is recommended if you:  Currently smoke.  Have a history of at least 30 pack-years of smoking and you currently smoke or have quit within the past 15 years. A pack-year is smoking an average of one pack of cigarettes per day for one year.  Yearly screening should:  Continue until it has been 15 years since you quit.  Stop if you develop a health problem that would prevent you from having lung cancer treatment.  Colorectal Cancer  This type of cancer can be detected and can often be prevented.  Routine colorectal cancer screening usually begins at age 39 and continues through age 72.  If you have risk factors for colon cancer, your health care provider may recommend  that you be screened at an earlier age.  If you have a family history of colorectal cancer, talk with your health care provider about genetic screening.  Your health care provider may also recommend using home test kits to check for hidden blood in your stool.  A small camera at the end of a tube can be used to examine your colon directly (sigmoidoscopy or colonoscopy). This is done to check for the earliest forms of colorectal cancer.  Direct examination of the colon should be repeated every 5-10 years until age 75. However, if early forms of precancerous polyps or small growths are found or if you have a family history or genetic risk for colorectal cancer, you may need to be screened more often.  Skin Cancer  Check your skin from head to toe regularly.  Monitor any moles. Be sure to tell your health care provider: ? About any new moles or changes in moles, especially if there is a change in a mole's shape or color. ? If you have a mole that is larger than the size of a pencil eraser.  If any of your  family members has a history of skin cancer, especially at a young age, talk with your health care provider about genetic screening.  Always use sunscreen. Apply sunscreen liberally and repeatedly throughout the day.  Whenever you are outside, protect yourself by wearing long sleeves, pants, a wide-brimmed hat, and sunglasses.  What should I know about osteoporosis? Osteoporosis is a condition in which bone destruction happens more quickly than new bone creation. After menopause, you may be at an increased risk for osteoporosis. To help prevent osteoporosis or the bone fractures that can happen because of osteoporosis, the following is recommended:  If you are 19-50 years old, get at least 1,000 mg of calcium and at least 600 mg of vitamin D per day.  If you are older than age 50 but younger than age 70, get at least 1,200 mg of calcium and at least 600 mg of vitamin D per day.  If you are older than age 70, get at least 1,200 mg of calcium and at least 800 mg of vitamin D per day.  Smoking and excessive alcohol intake increase the risk of osteoporosis. Eat foods that are rich in calcium and vitamin D, and do weight-bearing exercises several times each week as directed by your health care provider. What should I know about how menopause affects my mental health? Depression may occur at any age, but it is more common as you become older. Common symptoms of depression include:  Low or sad mood.  Changes in sleep patterns.  Changes in appetite or eating patterns.  Feeling an overall lack of motivation or enjoyment of activities that you previously enjoyed.  Frequent crying spells.  Talk with your health care provider if you think that you are experiencing depression. What should I know about immunizations? It is important that you get and maintain your immunizations. These include:  Tetanus, diphtheria, and pertussis (Tdap) booster vaccine.  Influenza every year before the flu season  begins.  Pneumonia vaccine.  Shingles vaccine.  Your health care provider may also recommend other immunizations. This information is not intended to replace advice given to you by your health care provider. Make sure you discuss any questions you have with your health care provider. Document Released: 03/02/2005 Document Revised: 07/29/2015 Document Reviewed: 10/12/2014 Elsevier Interactive Patient Education  2018 Elsevier Inc.     

## 2016-08-30 NOTE — Progress Notes (Signed)
Subjective:   [name] is a 83 y.o. female who presents for Medicare Annual (Subsequent) preventive examination.  The Patient was informed that the wellness visit is to identify future health risk and educate and initiate measures that can reduce risk for increased disease through the lifespan.    Annual Wellness Assessment  Reports health as good overall but in pain today w right hip; seen Dr Selena Batten  Preventive Screening -Counseling & Management  Medicare Annual Preventive Care Visit - Subsequent Last OV - today  Discussed Rh retiring and needs another Rh md. Was seeing Dr. Jake Samples  Bone density in 2016 - declines today/ on fosamax Mammogram deferred due to age  Flu vaccine in the fall  Declines low dose CT due to smoking hx    Describes Health as poor, fair, good or great? In pain today due right hip - going for an xray  VS reviewed;   Diet - appetite is good Breakfast has fruit Bacon eggs or toast Lunch; had main meal at lunch / meat and vegetables Supper; light meal   BMI 20   Exercise in pain today She exercises daily until she hurt her hip Chair exercises in the bed Does her cleaning and shopping  Dental - had a lot of dental work    Sleep patterns: does sleep; 8 to 10  Hours of sleep     Cardiac Risk Factors Addressed Hyperlipidemia loook good   Pre-diabetes A1c 5.8    Advanced Directives completed  Patient Care Team: Terressa Koyanagi, DO as PCP - General (Family Medicine) Assessed for additional providers  Immunization History  Administered Date(s) Administered  . Influenza Split 10/24/2010, 10/04/2011  . Influenza Whole 12/18/2006, 11/12/2007, 10/19/2008, 10/25/2009  . Influenza, High Dose Seasonal PF 10/12/2014, 10/10/2015  . Influenza,inj,Quad PF,36+ Mos 10/02/2012, 10/21/2013  . Pneumococcal Conjugate-13 07/20/2014  . Pneumococcal Polysaccharide-23 11/23/1994, 09/08/2012  . Td 06/22/1997, 10/15/2007   Required  Immunizations needed today  Screening test up to date or reviewed for plan of completion Health Maintenance Due  Topic Date Due  . INFLUENZA VACCINE  08/22/2016     Plans to take flu vaccine this fall      Cardiac Risk Factors include: advanced age (>78men, >38 women)     Objective:     Vitals: BP 138/78   Pulse 72   Temp 97.9 F (36.6 C) (Oral)   Ht 5\' 1"  (1.549 m)   Wt 106 lb (48.1 kg)   BMI 20.03 kg/m   Body mass index is 20.03 kg/m.   Tobacco History  Smoking Status  . Former Smoker  . Packs/day: 2.00  . Years: 58.00  . Start date: 01/23/1946  . Quit date: 07/12/2006  Smokeless Tobacco  . Never Used     Counseling given: Yes   Past Medical History:  Diagnosis Date  . Alcohol abuse   . Emphysema of lung (HCC) 01/26/2009   pt. unsure of this reports as of 07/31/2012  . Fracture of fifth metatarsal bone of right foot 08/29/2012  . Gout 08/11/2012  . Hyperlipemia 03/27/2007  . Hypertension   . Osteoporosis 07/12/2006  . Pernicious anemia 10/25/2006  . Polymyalgia rheumatica (HCC) 03/27/2007   sees Dr. Kellie Simmering in rheumatology  . Vitamin D deficiency 07/12/2006   Past Surgical History:  Procedure Laterality Date  . APPENDECTOMY  07/12/2006  . PARTIAL HIP ARTHROPLASTY     left side- 2x's ( 2nd surgery to correct 1st replacement), R side -   .  TOOTH EXTRACTION N/A 08/04/2012   Procedure: EXTRACTIONS 17, 20;  Surgeon: Georgia LopesScott M Jensen, DDS;  Location: [facility] OR;  Service: Oral Surgery;  Laterality: N/A;   Family History  Problem Relation Age of Onset  . Mental illness Mother        alzheimer dementia  . Ulcers Father   . Heart disease Father   . Cancer Father        lung, smoker   History  Sexual Activity  . Sexual activity: No    Outpatient Encounter Prescriptions as of 08/30/2016  Medication Sig  . alendronate (FOSAMAX) 70 MG tablet Take 1 tablet (70 mg total) by mouth every 7 (seven) days.  . Ascorbic Acid (VITAMIN C PO) Take 500 mg by mouth daily.  Marland Kitchen.  CALCIUM PO Take by mouth.  . Cholecalciferol (VITAMIN D PO) Take 4,000 Units by mouth daily.  . Cyanocobalamin (VITAMIN B-12) 1000 MCG SUBL Place under the tongue.  . hydrochlorothiazide (HYDRODIURIL) 12.5 MG tablet Take 1 tablet (12.5 mg total) by mouth daily.  . predniSONE (DELTASONE) 5 MG tablet Take 2.5 mg by mouth daily.    No facility-administered encounter medications on file as of 08/30/2016.     Activities of Daily Living In your present state of health, do you have any difficulty performing the following activities: 08/30/2016  Hearing? Y  Vision? N  Comment had cataract surgery; sees eye doctor one time a year  Difficulty concentrating or making decisions? N  Walking or climbing stairs? Y  Dressing or bathing? N  Doing errands, shopping? N  Preparing Food and eating ? N  Using the Toilet? N  In the past six months, have you accidently leaked urine? N  Do you have problems with loss of bowel control? N  Managing your Medications? N  Managing your Finances? N  Housekeeping or managing your Housekeeping? N  Some recent data might be hidden    Patient Care Team: Terressa KoyanagiKim, Hannah R, DO as PCP - General (Family Medicine)    Assessment:    Exercise Activities and Dietary recommendations Current Exercise Habits: Home exercise routine, Intensity: Mild  Goals    None     Fall Risk Fall Risk  08/30/2016 03/19/2014 09/08/2012  Falls in the past year? No No No   Depression Screen PHQ 2/9 Scores 08/30/2016 03/19/2014 09/08/2012  PHQ - 2 Score 0 0 0     Cognitive Function MMSE - Mini Mental State Exam 08/30/2016  Orientation to time 5  Orientation to Place 5  Registration 3  Attention/ Calculation 5  Recall 2  Language- name 2 objects 2  Language- repeat 1  Language- follow 3 step command 3  Language- read & follow direction 1  Write a sentence 1  Copy design 0  Total score 28        Immunization History  Administered Date(s) Administered  . Influenza Split 10/24/2010,  10/04/2011  . Influenza Whole 12/18/2006, 11/12/2007, 10/19/2008, 10/25/2009  . Influenza, High Dose Seasonal PF 10/12/2014, 10/10/2015  . Influenza,inj,Quad PF,36+ Mos 10/02/2012, 10/21/2013  . Pneumococcal Conjugate-13 07/20/2014  . Pneumococcal Polysaccharide-23 11/23/1994, 09/08/2012  . Td 06/22/1997, 10/15/2007   Screening Tests Health Maintenance  Topic Date Due  . INFLUENZA VACCINE  08/22/2016  . TETANUS/TDAP  10/14/2017  . DEXA SCAN  Addressed  . PNA vac Low Risk Adult  Completed      Plan:     PCP Notes   Health Maintenance Bone density in July 2016. Is currently on Fosamax. Will  defer another DEXA for now. (t score -0.2)  Will take flu vaccine this fall.  Given information on the shingrix but is not interested in taking this at the current time.  Discussed low-dose CT referral due to long-term smoking history. Not sure quit date. States she had quit 6 years prior some point in time your smoking history. Declines low-dose CT.  Abnormal Screens  Does have issues with hearing. Has not had a hearing screen. Referred to the division of hard of hearing for a hearing aid as well as referral for screening.   Referrals  As noted to the division of hard of hearing.  Patient concerns; The patient's rheumatoid doctor has retired. She is in process looking for a doctor. At this time she needs another rheumatoid doctor to manage her polymyalgia rheumatica and her prednisone therapy.  Nurse Concerns; Mini-Mental status exam checked. Score was 28. No failures of independent living.  Next PCP apt was seen today by Dr. Selena Batten     I have personally reviewed and noted the following in the patient's chart:   . Medical and social history . Use of alcohol, tobacco or illicit drugs  . Current medications and supplements . Functional ability and status . Nutritional status . Physical activity . Advanced directives . List of other physicians . Hospitalizations, surgeries, and  ER visits in previous 12 months . Vitals . Screenings to include cognitive, depression, and falls . Referrals and appointments  In addition, I have reviewed and discussed with patient certain preventive protocols, quality metrics, and best practice recommendations. A written personalized care plan for preventive services as well as general preventive health recommendations were provided to patient.     Montine Circle, RN  08/30/2016

## 2016-09-10 ENCOUNTER — Telehealth: Payer: Self-pay | Admitting: Family Medicine

## 2016-09-10 DIAGNOSIS — H9190 Unspecified hearing loss, unspecified ear: Secondary | ICD-10-CM

## 2016-09-10 NOTE — Telephone Encounter (Signed)
Ok to place referral.

## 2016-09-10 NOTE — Telephone Encounter (Signed)
Pt states [name] recommended pt get a hearing test. Pt has called  [facility] Pt needs a referral to [facility]  Fax  [phone]  Pt has appt next Wed 8/29 at 1 pm

## 2016-09-10 NOTE — Telephone Encounter (Signed)
Referral placed.

## 2016-09-19 ENCOUNTER — Encounter: Payer: Self-pay | Admitting: Family Medicine

## 2016-09-19 DIAGNOSIS — Z822 Family history of deafness and hearing loss: Secondary | ICD-10-CM | POA: Diagnosis not present

## 2016-09-19 DIAGNOSIS — H903 Sensorineural hearing loss, bilateral: Secondary | ICD-10-CM | POA: Diagnosis not present

## 2016-10-15 ENCOUNTER — Other Ambulatory Visit: Payer: Self-pay | Admitting: Family Medicine

## 2016-10-16 ENCOUNTER — Telehealth: Payer: Self-pay | Admitting: *Deleted

## 2016-10-16 NOTE — Telephone Encounter (Signed)
As long as she feels well and feels is stable on these medications ok to refill #90 and advise follow up in 2-3 months to discuss and help her with transition to new rheumatologist vs cont care here.

## 2016-10-16 NOTE — Telephone Encounter (Signed)
[facility] faxed a refill request on Prednisone 2.5mg -#90-take 1 tablet once daily and also Alendronate -#12-take 1 tablet by mouth once a week on Mondays on empty stomach with 6 ounces of water and nothing by mouth for 30 minutes.  Note attached stated pt stated Dr Kellie Simmering has retired and she would like Dr Selena Batten refill these medications.

## 2016-10-18 MED ORDER — PREDNISONE 5 MG PO TABS: 2.5000 mg | ORAL_TABLET | Freq: Every day | ORAL | 0 refills | Status: DC

## 2016-10-18 MED ORDER — ALENDRONATE SODIUM 70 MG PO TABS: 70.0000 mg | ORAL_TABLET | ORAL | 0 refills | Status: DC

## 2016-10-18 NOTE — Telephone Encounter (Signed)
Refills were sent to the pts pharmacy and I called the pt and informed her husband of the message below.

## 2016-10-19 ENCOUNTER — Telehealth: Payer: Self-pay | Admitting: Family Medicine

## 2016-10-19 NOTE — Telephone Encounter (Signed)
Pt has AWV scheduled for 10/4 -  This was done back in Aug - left message on home number to call us back so we can officially cancel the appt.  Called cell - vm wasn't set up yet.

## 2016-10-25 ENCOUNTER — Ambulatory Visit (INDEPENDENT_AMBULATORY_CARE_PROVIDER_SITE_OTHER): Payer: Medicare Other

## 2016-10-25 ENCOUNTER — Ambulatory Visit: Payer: Medicare Other

## 2016-10-25 DIAGNOSIS — Z23 Encounter for immunization: Secondary | ICD-10-CM

## 2017-01-18 ENCOUNTER — Ambulatory Visit (INDEPENDENT_AMBULATORY_CARE_PROVIDER_SITE_OTHER): Payer: Medicare Other | Admitting: Internal Medicine

## 2017-01-18 ENCOUNTER — Encounter: Payer: Self-pay | Admitting: Internal Medicine

## 2017-01-18 ENCOUNTER — Ambulatory Visit: Payer: Self-pay

## 2017-01-18 VITALS — BP 148/80 | HR 64 | Temp 98.0°F | Wt 109.6 lb

## 2017-01-18 DIAGNOSIS — M810 Age-related osteoporosis without current pathological fracture: Secondary | ICD-10-CM | POA: Diagnosis not present

## 2017-01-18 DIAGNOSIS — M25551 Pain in right hip: Secondary | ICD-10-CM

## 2017-01-18 DIAGNOSIS — Z9889 Other specified postprocedural states: Secondary | ICD-10-CM

## 2017-01-18 DIAGNOSIS — M353 Polymyalgia rheumatica: Secondary | ICD-10-CM | POA: Diagnosis not present

## 2017-01-18 NOTE — Progress Notes (Signed)
Chief Complaint  Patient presents with  . Hip Pain    x 1 day. Difficulty walking d/t pain. Pain radiates into groin region. Does not remember injuring herself.    HPI: [name] 83 y.o.  sda  pcp NA  History of 1 day of right hip pain and aching that radiates from her groin down to her knee.  No injury or specific fall just regular housework ADL activities.  She is walking with a cane.  No falling does not remember an event.  She has a remote history of a fracture and partial hip surgery on both hips the right while back does not remember the name of the surgeon.  Has not had a major problem with this recently. She used to see Dr. Harriet Massonruss low rheumatologist but currently not with an orthopedist or rheumatologist now. Has seen Dr. Jonetta OsgoodWeiner's office in the past for wrist problem.   ROS: See pertinent positives and negatives per HPI.  Past Medical History:  Diagnosis Date  . Alcohol abuse   . Emphysema of lung (HCC) 01/26/2009   pt. unsure of this reports as of 07/31/2012  . Fracture of fifth metatarsal bone of right foot 08/29/2012  . Gout 08/11/2012  . Hyperlipemia 03/27/2007  . Hypertension   . Osteoporosis 07/12/2006  . Pernicious anemia 10/25/2006  . Polymyalgia rheumatica (HCC) 03/27/2007   sees Dr. Kellie Simmeringruslow in rheumatology  . Vitamin D deficiency 07/12/2006    Family History  Problem Relation Age of Onset  . Mental illness Mother        alzheimer dementia  . Ulcers Father   . Heart disease Father   . Cancer Father        lung, smoker    Social History   Socioeconomic History  . Marital status: Married    Spouse name: None  . Number of children: None  . Years of education: None  . Highest education level: None  Social Needs  . Financial resource strain: None  . Food insecurity - worry: None  . Food insecurity - inability: None  . Transportation needs - medical: None  . Transportation needs - non-medical: None  Occupational History  . None  Tobacco Use  .  Smoking status: Former Smoker    Packs/day: 2.00    Years: 58.00    Pack years: 116.00    Start date: 01/23/1948    Last attempt to quit: 07/12/2006    Years since quitting: 10.5  . Smokeless tobacco: Never Used  Substance and Sexual Activity  . Alcohol use: Yes    Alcohol/week: 6.0 oz    Types: 10 Shots of liquor per week    Comment: quit completely - 2012  . Drug use: No  . Sexual activity: No  Other Topics Concern  . None  Social History Narrative   Work or School: none      Home Situation: lives with husband - drives, husband takes care of finances, dresses herself, takes care of housework and cooking      Spiritual Beliefs: [employer]      Lifestyle: uses cane when goes out, exercises sometimes - diet healthy             Outpatient Medications Prior to Visit  Medication Sig Dispense Refill  . alendronate (FOSAMAX) 70 MG tablet Take 1 tablet (70 mg total) by mouth every 7 (seven) days. 12 tablet 0  . Ascorbic Acid (VITAMIN C PO) Take 500 mg by mouth daily.    .Marland Kitchen  CALCIUM PO Take by mouth.    . Cholecalciferol (VITAMIN D PO) Take 4,000 Units by mouth daily.    . Cyanocobalamin (VITAMIN B-12) 1000 MCG SUBL Place under the tongue.    . hydrochlorothiazide (HYDRODIURIL) 12.5 MG tablet TAKE ONE TABLET BY MOUTH ONCE DAILY 90 tablet 1  . predniSONE (DELTASONE) 5 MG tablet Take 0.5 tablets (2.5 mg total) by mouth daily. 90 tablet 0   No facility-administered medications prior to visit.      EXAM:  BP (!) 148/80 (BP Location: Right Arm, Patient Position: Sitting, Cuff Size: Normal)   Pulse 64   Temp 98 F (36.7 C) (Oral)   Wt 109 lb 9.6 oz (49.7 kg)   BMI 20.71 kg/m   Body mass index is 20.71 kg/m.  GENERAL: vitals reviewed and listed above, alert, oriented, appears well hydrated and in no acute distress she is walking with a cane is able to weight-bear but uncomfortable and favors her right leg. HEENT: atraumatic, conjunctiva  clear, no obvious abnormalities on  inspection of external nose and ears O NECK: no obvious masses on inspection palpation   CV: HRRR, no clubbing cyanosis or  peripheral edema nl cap refill  MS: moves all extremities right hip passive range of motion appears to be without significant pain but weightbearing on the right hip causes mild to moderate discomfort.  No midline back pain. PSYCH: pleasant and cooperative, no obvious depression or anxiety  ASSESSMENT AND PLAN:  Discussed the following assessment and plan:  Acute right hip pain - Plan: DG HIP UNILAT WITH PELVIS 2-3 VIEWS RIGHT  Osteoporosis without current pathological fracture, unspecified osteoporosis type  POLYMYALGIA RHEUMATICA  History of hip surgery Location of pain and radiation is consistent with hip related pain.  Because of groin pain and radiation to the knee. Has a past history of hip disease fracture and surgery but I do not have those records. Advised see orthopedist and x-ray it is Friday before holiday has appointment on December 31 at Dr. Janeece FittingMurphy Wainer's office.  At this time we will delay getting an x-ray because she did not have a fall and they will x-rayed in their office.  Expectant management no evidence -Patient advised to return or notify health care team  if symptoms worsen ,persist or new concerns arise.  Patient Instructions  Want you to see  Orthopedic group about your hip   . See  Ortho as planned    X ray order.      I agree this   Is hip related pain .   And   Keep appt even if feeling better.  Neta Mends.       Quamesha Mullet K. Noeli Lavery M.D.

## 2017-01-18 NOTE — Telephone Encounter (Signed)
rec'd call from pt.  C/o right hip and groin pain x 24 hrs.  Reported she has radiation of the pain to the right knee.  Stated the pain is "moderate".  Denied swelling of right LE.  Denied redness, warmth, or tenderness of the right hip/ groin.  Stated the pain is constant and increases with weight bearing.  Reported hx of partial hip replacement Stated has had hx of back pain and was treated for that last summer.  Reported that the right lower back and right groin hurt yesterday, and today the pain has moved to the right hip and groin.  Denied any back pain today. Denied numbness of the right LE.  The pt. stated she is using heat to the hip.  Per protocol, scheduled an appt. Today with [facility] @ [facility].      Reason for Disposition . [1] MODERATE pain (e.g., interferes with normal activities, limping) AND [2] present > 3 days  Answer Assessment - Initial Assessment Questions 1. LOCATION and RADIATION: "Where is the pain located?"      Right hip pain radiating down to the knee 2. QUALITY: "What does the pain feel like?"  (e.g., sharp, dull, aching, burning)     Constant ache ; worse with walking; was throbbing during night 3. SEVERITY: "How bad is the pain?" "What does it keep you from doing?"   (Scale 1-10; or mild, moderate, severe)   -  MILD (1-3): doesn't interfere with normal activities    -  MODERATE (4-7): interferes with normal activities (e.g., work or school) or awakens from sleep, limping    -  SEVERE (8-10): excruciating pain, unable to do any normal activities, unable to walk     moderate 4. ONSET: "When did the pain start?" "Does it come and go, or is it there all the time?"     Yesterday; constant; it hurt in my back and groin and now from hip to knee 5. WORK OR EXERCISE: "Has there been any recent work or exercise that involved this part of the body?"     Reported back pain last summer. 6. CAUSE: "What do you think is causing the hip pain?"      Unknown. 7. AGGRAVATING FACTORS:  "What makes the hip pain worse?" (e.g., walking, climbing stairs, running) It hurts more with walking; now using a cane     8. OTHER SYMPTOMS: "Do you have any other symptoms?" (e.g., back pain, pain shooting down leg,  fever, rash)     Had right lower back and right groin pain yesterday; denies back pain today; denies any redness or swelling.  Protocols used: HIP PAIN-A-AH

## 2017-01-18 NOTE — Patient Instructions (Addendum)
Want you to see  Orthopedic group about your hip   . See  Ortho as planned    X ray order.      I agree this   Is hip related pain .   And   Keep appt even if feeling better.  .Marland Kitchen

## 2017-01-21 ENCOUNTER — Other Ambulatory Visit: Payer: Self-pay | Admitting: Orthopedic Surgery

## 2017-01-21 DIAGNOSIS — M25551 Pain in right hip: Secondary | ICD-10-CM | POA: Diagnosis not present

## 2017-01-21 DIAGNOSIS — R102 Pelvic and perineal pain: Secondary | ICD-10-CM

## 2017-01-23 ENCOUNTER — Ambulatory Visit
Admission: RE | Admit: 2017-01-23 | Discharge: 2017-01-23 | Disposition: A | Discharge status: Home / Self Care | Payer: Medicare Other | Source: Ambulatory Visit | Attending: Orthopedic Surgery | Admitting: Orthopedic Surgery

## 2017-01-23 DIAGNOSIS — R102 Pelvic and perineal pain: Secondary | ICD-10-CM

## 2017-01-23 DIAGNOSIS — K5730 Diverticulosis of large intestine without perforation or abscess without bleeding: Secondary | ICD-10-CM | POA: Diagnosis not present

## 2017-01-25 ENCOUNTER — Other Ambulatory Visit: Payer: Self-pay | Admitting: Orthopedic Surgery

## 2017-01-25 DIAGNOSIS — M549 Dorsalgia, unspecified: Secondary | ICD-10-CM

## 2017-01-25 DIAGNOSIS — M25551 Pain in right hip: Secondary | ICD-10-CM

## 2017-01-29 ENCOUNTER — Ambulatory Visit
Admission: RE | Admit: 2017-01-29 | Discharge: 2017-01-29 | Disposition: A | Discharge status: Home / Self Care | Payer: Medicare Other | Source: Ambulatory Visit | Attending: Orthopedic Surgery | Admitting: Orthopedic Surgery

## 2017-01-29 DIAGNOSIS — M25551 Pain in right hip: Secondary | ICD-10-CM

## 2017-01-29 DIAGNOSIS — M48061 Spinal stenosis, lumbar region without neurogenic claudication: Secondary | ICD-10-CM | POA: Diagnosis not present

## 2017-01-29 DIAGNOSIS — M549 Dorsalgia, unspecified: Secondary | ICD-10-CM

## 2017-02-04 ENCOUNTER — Other Ambulatory Visit: Payer: Self-pay | Admitting: Neurological Surgery

## 2017-02-04 DIAGNOSIS — M5126 Other intervertebral disc displacement, lumbar region: Secondary | ICD-10-CM | POA: Diagnosis not present

## 2017-02-08 NOTE — Pre-Procedure Instructions (Signed)
Shan LevansRebecca T Pollick  02/08/2017      [facility] [place] - [address] [place] [id number] Phone: [phone] Fax: [phone]  [facility] [address] - [id number] [place] [address] Phone: [phone] Fax: [phone]    Your procedure is scheduled on January 23  [facility] at 10[facility] A.M.  Call this number if you have problems the morning of surgery:  [phone]   Remember:  Do not eat food or drink liquids after midnight.  Continue all medications as directed by your physician except follow these medication instructions before surgery below   Take these medicines the morning of surgery with A SIP OF WATER  acetaminophen (TYLENOL) predniSONE (DELTASONE) traMADol (ULTRAM)  7 days prior to surgery STOP taking any Aspirin(unless otherwise instructed by your surgeon), Aleve, Naproxen, Ibuprofen, Motrin, Advil, Goody's, BC's, all herbal medications, fish oil, and all vitamins    Do not wear jewelry, make-up or nail polish.  Do not wear lotions, powders, or perfumes, or deodorant.  Do not shave 48 hours prior to surgery.   Do not bring valuables to the hospital.  [facility] is not responsible for any belongings or valuables.  Contacts, dentures or bridgework may not be worn into surgery.  Leave your suitcase in the car.  After surgery it may be brought to your room.  For patients admitted to the hospital, discharge time will be determined by your treatment team.  Patients discharged the day of surgery will not be allowed to drive home.    Special instructions:   [facility]- Preparing For Surgery  Before surgery, you can play an important role. Because skin is not sterile, your skin needs to be as free of germs as possible. You can reduce the number of germs on your skin by washing with CHG (chlorahexidine  gluconate) Soap before surgery.  CHG is an antiseptic cleaner which kills germs and bonds with the skin to continue killing germs even after washing.  Please do not use if you have an allergy to CHG or antibacterial soaps. If your skin becomes reddened/irritated stop using the CHG.  Do not shave (including legs and underarms) for at least 48 hours prior to first CHG shower. It is OK to shave your face.  Please follow these instructions carefully.   1. Shower the NIGHT BEFORE SURGERY and the MORNING OF SURGERY with CHG.   2. If you chose to wash your hair, wash your hair first as usual with your normal shampoo.  3. After you shampoo, rinse your hair and body thoroughly to remove the shampoo.  4. Use CHG as you would any other liquid soap. You can apply CHG directly to the skin and wash gently with a scrungie or a clean washcloth.   5. Apply the CHG Soap to your body ONLY FROM THE NECK DOWN.  Do not use on open wounds or open sores. Avoid contact with your eyes, ears, mouth and genitals (private parts). Wash Face and genitals (private parts)  with your normal soap.  6. Wash thoroughly, paying special attention to the area where your surgery will be performed.  7. Thoroughly rinse your body with warm water from the neck down.  8. DO NOT shower/wash with your normal soap after using and rinsing off the CHG Soap.  9. Pat yourself dry with a CLEAN TOWEL.  10. Wear  CLEAN PAJAMAS to bed the night before surgery, wear comfortable clothes the morning of surgery  11. Place CLEAN SHEETS on your bed the night of your first shower and DO NOT SLEEP WITH PETS.    Day of Surgery: Do not apply any deodorants/lotions. Please wear clean clothes to the hospital/surgery center.      Please read over the following fact sheets that you were given.

## 2017-02-11 ENCOUNTER — Other Ambulatory Visit: Payer: Self-pay

## 2017-02-11 ENCOUNTER — Encounter (HOSPITAL_COMMUNITY)
Admission: RE | Admit: 2017-02-11 | Discharge: 2017-02-11 | Disposition: A | Discharge status: Home / Self Care | Payer: Medicare Other | Source: Ambulatory Visit | Attending: Neurological Surgery | Admitting: Neurological Surgery

## 2017-02-11 ENCOUNTER — Encounter (HOSPITAL_COMMUNITY): Payer: Self-pay

## 2017-02-11 ENCOUNTER — Ambulatory Visit (HOSPITAL_COMMUNITY)
Admission: RE | Admit: 2017-02-11 | Discharge: 2017-02-11 | Disposition: A | Discharge status: Home / Self Care | Payer: Medicare Other | Source: Ambulatory Visit | Attending: Neurological Surgery | Admitting: Neurological Surgery

## 2017-02-11 DIAGNOSIS — I700 Atherosclerosis of aorta: Secondary | ICD-10-CM | POA: Diagnosis not present

## 2017-02-11 DIAGNOSIS — M5126 Other intervertebral disc displacement, lumbar region: Secondary | ICD-10-CM

## 2017-02-11 DIAGNOSIS — R9431 Abnormal electrocardiogram [ECG] [EKG]: Secondary | ICD-10-CM | POA: Insufficient documentation

## 2017-02-11 DIAGNOSIS — Z01812 Encounter for preprocedural laboratory examination: Secondary | ICD-10-CM | POA: Insufficient documentation

## 2017-02-11 DIAGNOSIS — Z01818 Encounter for other preprocedural examination: Secondary | ICD-10-CM | POA: Insufficient documentation

## 2017-02-11 LAB — CBC WITH DIFFERENTIAL/PLATELET
BASOS ABS: 0 10*3/uL (ref 0.0–0.1)
BASOS PCT: 0 %
Eosinophils Absolute: 0.2 10*3/uL (ref 0.0–0.7)
Eosinophils Relative: 3 %
HEMATOCRIT: 41.7 % (ref 36.0–46.0)
Hemoglobin: 14 g/dL (ref 12.0–15.0)
Lymphocytes Relative: 24 %
Lymphs Abs: 1.8 10*3/uL (ref 0.7–4.0)
MCH: 33.1 pg (ref 26.0–34.0)
MCHC: 33.6 g/dL (ref 30.0–36.0)
MCV: 98.6 fL (ref 78.0–100.0)
MONO ABS: 0.6 10*3/uL (ref 0.1–1.0)
Monocytes Relative: 8 %
NEUTROS ABS: 4.9 10*3/uL (ref 1.7–7.7)
NEUTROS PCT: 65 %
PLATELETS: 239 10*3/uL (ref 150–400)
RBC: 4.23 MIL/uL (ref 3.87–5.11)
RDW: 13 % (ref 11.5–15.5)
WBC: 7.6 10*3/uL (ref 4.0–10.5)

## 2017-02-11 LAB — PROTIME-INR
INR: 0.98
PROTHROMBIN TIME: 12.9 s (ref 11.4–15.2)

## 2017-02-11 LAB — SURGICAL PCR SCREEN
MRSA, PCR: NEGATIVE
Staphylococcus aureus: NEGATIVE

## 2017-02-11 LAB — BASIC METABOLIC PANEL
ANION GAP: 11 (ref 5–15)
BUN: 13 mg/dL (ref 6–20)
CALCIUM: 9.2 mg/dL (ref 8.9–10.3)
CO2: 27 mmol/L (ref 22–32)
Chloride: 99 mmol/L — ABNORMAL LOW (ref 101–111)
Creatinine, Ser: 1.09 mg/dL — ABNORMAL HIGH (ref 0.44–1.00)
GFR calc Af Amer: 52 mL/min — ABNORMAL LOW (ref >60)
GFR, EST NON AFRICAN AMERICAN: 45 mL/min — AB (ref >60)
GLUCOSE: 102 mg/dL — AB (ref 65–99)
Potassium: 3.7 mmol/L (ref 3.5–5.1)
Sodium: 137 mmol/L (ref 135–145)

## 2017-02-11 NOTE — Progress Notes (Signed)
PCP - Dr. Selena BattenKim Cardiologist - Patient denies  Chest x-ray - 02/11/2017 EKG - 02/11/2017 Stress Test - patient denies ECHO - patient denies Cardiac Cath - patient denies  Sleep Study - patient denies  Anesthesia review:   Patient denies shortness of breath, fever, cough and chest pain at PAT appointment   Patient verbalized understanding of instructions that were given to them at the PAT appointment. Patient was also instructed that they will need to review over the PAT instructions again at home before surgery.

## 2017-02-13 ENCOUNTER — Observation Stay (HOSPITAL_COMMUNITY)
Admission: RE | Admit: 2017-02-13 | Discharge: 2017-02-14 | Disposition: A | Discharge status: Home / Self Care | Payer: Medicare Other | Source: Ambulatory Visit | Attending: Neurological Surgery | Admitting: Neurological Surgery

## 2017-02-13 ENCOUNTER — Inpatient Hospital Stay (HOSPITAL_COMMUNITY): Payer: Medicare Other | Admitting: Emergency Medicine

## 2017-02-13 ENCOUNTER — Inpatient Hospital Stay (HOSPITAL_COMMUNITY): Payer: Medicare Other | Admitting: Anesthesiology

## 2017-02-13 ENCOUNTER — Encounter (HOSPITAL_COMMUNITY)
Admission: RE | Disposition: A | Discharge status: Home / Self Care | Payer: Self-pay | Source: Ambulatory Visit | Attending: Neurological Surgery

## 2017-02-13 ENCOUNTER — Inpatient Hospital Stay (HOSPITAL_COMMUNITY): Payer: Medicare Other

## 2017-02-13 ENCOUNTER — Encounter (HOSPITAL_COMMUNITY): Payer: Self-pay | Admitting: Neurological Surgery

## 2017-02-13 DIAGNOSIS — M109 Gout, unspecified: Secondary | ICD-10-CM | POA: Insufficient documentation

## 2017-02-13 DIAGNOSIS — J449 Chronic obstructive pulmonary disease, unspecified: Secondary | ICD-10-CM | POA: Diagnosis not present

## 2017-02-13 DIAGNOSIS — M353 Polymyalgia rheumatica: Secondary | ICD-10-CM | POA: Diagnosis not present

## 2017-02-13 DIAGNOSIS — I10 Essential (primary) hypertension: Secondary | ICD-10-CM | POA: Diagnosis not present

## 2017-02-13 DIAGNOSIS — E785 Hyperlipidemia, unspecified: Secondary | ICD-10-CM | POA: Insufficient documentation

## 2017-02-13 DIAGNOSIS — Z79899 Other long term (current) drug therapy: Secondary | ICD-10-CM | POA: Insufficient documentation

## 2017-02-13 DIAGNOSIS — Z981 Arthrodesis status: Secondary | ICD-10-CM | POA: Diagnosis not present

## 2017-02-13 DIAGNOSIS — E559 Vitamin D deficiency, unspecified: Secondary | ICD-10-CM | POA: Insufficient documentation

## 2017-02-13 DIAGNOSIS — Z9889 Other specified postprocedural states: Secondary | ICD-10-CM

## 2017-02-13 DIAGNOSIS — M5116 Intervertebral disc disorders with radiculopathy, lumbar region: Principal | ICD-10-CM | POA: Insufficient documentation

## 2017-02-13 DIAGNOSIS — Z87891 Personal history of nicotine dependence: Secondary | ICD-10-CM | POA: Insufficient documentation

## 2017-02-13 DIAGNOSIS — D510 Vitamin B12 deficiency anemia due to intrinsic factor deficiency: Secondary | ICD-10-CM | POA: Diagnosis not present

## 2017-02-13 DIAGNOSIS — M5126 Other intervertebral disc displacement, lumbar region: Secondary | ICD-10-CM | POA: Diagnosis not present

## 2017-02-13 DIAGNOSIS — Z419 Encounter for procedure for purposes other than remedying health state, unspecified: Secondary | ICD-10-CM

## 2017-02-13 HISTORY — PX: LUMBAR LAMINECTOMY/DECOMPRESSION MICRODISCECTOMY: SHX5026

## 2017-02-13 SURGERY — LUMBAR LAMINECTOMY/DECOMPRESSION MICRODISCECTOMY 1 LEVEL
Anesthesia: General | Site: Back | Laterality: Right

## 2017-02-13 SURGERY — LUMBAR LAMINECTOMY/DECOMPRESSION MICRODISCECTOMY 1 LEVEL
Anesthesia: General | Site: Back

## 2017-02-13 MED ORDER — CHLORHEXIDINE GLUCONATE CLOTH 2 % EX PADS: 6.0000 | MEDICATED_PAD | Freq: Once | CUTANEOUS | Status: DC

## 2017-02-13 MED ORDER — LACTATED RINGERS IV SOLN
INTRAVENOUS | Status: DC | PRN
  Administered 2017-02-13 (×2): via INTRAVENOUS

## 2017-02-13 MED ORDER — PROPOFOL 10 MG/ML IV BOLUS
INTRAVENOUS | Status: DC | PRN
  Administered 2017-02-13: 120 mg via INTRAVENOUS

## 2017-02-13 MED ORDER — METHOCARBAMOL 500 MG PO TABS
500.0000 mg | ORAL_TABLET | Freq: Four times a day (QID) | ORAL | Status: DC | PRN
  Administered 2017-02-13 – 2017-02-14 (×2): 500 mg via ORAL
  Filled 2017-02-13 (×3): qty 1

## 2017-02-13 MED ORDER — ONDANSETRON HCL 4 MG/2ML IJ SOLN: 4.0000 mg | Freq: Once | INTRAMUSCULAR | Status: DC | PRN

## 2017-02-13 MED ORDER — EPHEDRINE 5 MG/ML INJ
INTRAVENOUS | Status: AC
  Filled 2017-02-13: qty 10

## 2017-02-13 MED ORDER — METHOCARBAMOL 1000 MG/10ML IJ SOLN
500.0000 mg | Freq: Four times a day (QID) | INTRAMUSCULAR | Status: DC | PRN
  Filled 2017-02-13: qty 5

## 2017-02-13 MED ORDER — ACETAMINOPHEN 325 MG PO TABS: 650.0000 mg | ORAL_TABLET | ORAL | Status: DC | PRN

## 2017-02-13 MED ORDER — PHENYLEPHRINE HCL 10 MG/ML IJ SOLN
INTRAVENOUS | Status: DC | PRN
  Administered 2017-02-13: 25 ug/min via INTRAVENOUS

## 2017-02-13 MED ORDER — ONDANSETRON HCL 4 MG/2ML IJ SOLN
INTRAMUSCULAR | Status: AC
  Filled 2017-02-13: qty 2

## 2017-02-13 MED ORDER — PHENYLEPHRINE 40 MCG/ML (10ML) SYRINGE FOR IV PUSH (FOR BLOOD PRESSURE SUPPORT)
PREFILLED_SYRINGE | INTRAVENOUS | Status: DC | PRN
  Administered 2017-02-13: 80 ug via INTRAVENOUS
  Administered 2017-02-13: 120 ug via INTRAVENOUS
  Administered 2017-02-13 (×2): 80 ug via INTRAVENOUS
  Administered 2017-02-13: 40 ug via INTRAVENOUS

## 2017-02-13 MED ORDER — ACETAMINOPHEN 650 MG RE SUPP: 650.0000 mg | RECTAL | Status: DC | PRN

## 2017-02-13 MED ORDER — LACTATED RINGERS IV SOLN
INTRAVENOUS | Status: DC
  Administered 2017-02-13: 11:00:00 via INTRAVENOUS

## 2017-02-13 MED ORDER — MORPHINE SULFATE (PF) 4 MG/ML IV SOLN: 1.0000 mg | INTRAVENOUS | Status: DC | PRN

## 2017-02-13 MED ORDER — HYDROCHLOROTHIAZIDE 25 MG PO TABS
12.5000 mg | ORAL_TABLET | Freq: Every day | ORAL | Status: DC
  Administered 2017-02-14: 12.5 mg via ORAL
  Filled 2017-02-13: qty 1

## 2017-02-13 MED ORDER — THROMBIN 5000 UNITS EX SOLR
CUTANEOUS | Status: AC
  Filled 2017-02-13: qty 10000

## 2017-02-13 MED ORDER — FENTANYL CITRATE (PF) 100 MCG/2ML IJ SOLN
25.0000 ug | INTRAMUSCULAR | Status: DC | PRN
  Administered 2017-02-13 (×2): 50 ug via INTRAVENOUS

## 2017-02-13 MED ORDER — ROCURONIUM BROMIDE 10 MG/ML (PF) SYRINGE
PREFILLED_SYRINGE | INTRAVENOUS | Status: AC
  Filled 2017-02-13: qty 5

## 2017-02-13 MED ORDER — PHENYLEPHRINE 40 MCG/ML (10ML) SYRINGE FOR IV PUSH (FOR BLOOD PRESSURE SUPPORT)
PREFILLED_SYRINGE | INTRAVENOUS | Status: AC
  Filled 2017-02-13: qty 10

## 2017-02-13 MED ORDER — 0.9 % SODIUM CHLORIDE (POUR BTL) OPTIME
TOPICAL | Status: DC | PRN
  Administered 2017-02-13: 1000 mL

## 2017-02-13 MED ORDER — DEXAMETHASONE SODIUM PHOSPHATE 4 MG/ML IJ SOLN
4.0000 mg | Freq: Four times a day (QID) | INTRAMUSCULAR | Status: DC
  Administered 2017-02-13 – 2017-02-14 (×3): 4 mg via INTRAVENOUS
  Filled 2017-02-13 (×3): qty 1

## 2017-02-13 MED ORDER — MENTHOL 3 MG MT LOZG: 1.0000 | LOZENGE | OROMUCOSAL | Status: DC | PRN

## 2017-02-13 MED ORDER — DIPHENHYDRAMINE HCL 25 MG PO CAPS
25.0000 mg | ORAL_CAPSULE | Freq: Every evening | ORAL | Status: DC | PRN
  Administered 2017-02-13: 25 mg via ORAL
  Filled 2017-02-13: qty 1

## 2017-02-13 MED ORDER — SODIUM CHLORIDE 0.9% FLUSH
3.0000 mL | INTRAVENOUS | Status: DC | PRN
Start: 2017-02-13 — End: 2017-02-13

## 2017-02-13 MED ORDER — PHENOL 1.4 % MT LIQD: 1.0000 | OROMUCOSAL | Status: DC | PRN

## 2017-02-13 MED ORDER — SENNA 8.6 MG PO TABS
1.0000 | ORAL_TABLET | Freq: Two times a day (BID) | ORAL | Status: DC
  Administered 2017-02-13 – 2017-02-14 (×2): 8.6 mg via ORAL
  Filled 2017-02-13 (×2): qty 1

## 2017-02-13 MED ORDER — PROPOFOL 1000 MG/100ML IV EMUL
INTRAVENOUS | Status: AC
  Filled 2017-02-13: qty 100

## 2017-02-13 MED ORDER — TRAMADOL HCL 50 MG PO TABS
50.0000 mg | ORAL_TABLET | Freq: Four times a day (QID) | ORAL | Status: DC
  Administered 2017-02-13 – 2017-02-14 (×4): 50 mg via ORAL
  Filled 2017-02-13 (×4): qty 1

## 2017-02-13 MED ORDER — FENTANYL CITRATE (PF) 250 MCG/5ML IJ SOLN
INTRAMUSCULAR | Status: DC | PRN
  Administered 2017-02-13: 100 ug via INTRAVENOUS
  Administered 2017-02-13: 50 ug via INTRAVENOUS

## 2017-02-13 MED ORDER — SUGAMMADEX SODIUM 200 MG/2ML IV SOLN
INTRAVENOUS | Status: DC | PRN
  Administered 2017-02-13: 200 mg via INTRAVENOUS

## 2017-02-13 MED ORDER — DEXAMETHASONE SODIUM PHOSPHATE 10 MG/ML IJ SOLN
10.0000 mg | INTRAMUSCULAR | Status: AC
  Administered 2017-02-13: 10 mg via INTRAVENOUS

## 2017-02-13 MED ORDER — CEFAZOLIN SODIUM-DEXTROSE 2-4 GM/100ML-% IV SOLN
2.0000 g | INTRAVENOUS | Status: AC
  Administered 2017-02-13: 2 g via INTRAVENOUS
  Filled 2017-02-13: qty 100

## 2017-02-13 MED ORDER — EPHEDRINE SULFATE-NACL 50-0.9 MG/10ML-% IV SOSY
PREFILLED_SYRINGE | INTRAVENOUS | Status: DC | PRN
  Administered 2017-02-13: 5 mg via INTRAVENOUS

## 2017-02-13 MED ORDER — BUPIVACAINE HCL (PF) 0.25 % IJ SOLN
INTRAMUSCULAR | Status: AC
  Filled 2017-02-13: qty 30

## 2017-02-13 MED ORDER — SUGAMMADEX SODIUM 200 MG/2ML IV SOLN
INTRAVENOUS | Status: AC
  Filled 2017-02-13: qty 2

## 2017-02-13 MED ORDER — FENTANYL CITRATE (PF) 100 MCG/2ML IJ SOLN
INTRAMUSCULAR | Status: AC
  Filled 2017-02-13: qty 2

## 2017-02-13 MED ORDER — LIDOCAINE 2% (20 MG/ML) 5 ML SYRINGE
INTRAMUSCULAR | Status: AC
  Filled 2017-02-13: qty 5

## 2017-02-13 MED ORDER — HEMOSTATIC AGENTS (NO CHARGE) OPTIME
TOPICAL | Status: DC | PRN
  Administered 2017-02-13: 1 via TOPICAL

## 2017-02-13 MED ORDER — PROPOFOL 10 MG/ML IV BOLUS
INTRAVENOUS | Status: AC
  Filled 2017-02-13: qty 20

## 2017-02-13 MED ORDER — LIDOCAINE 2% (20 MG/ML) 5 ML SYRINGE
INTRAMUSCULAR | Status: AC
  Filled 2017-02-13: qty 10

## 2017-02-13 MED ORDER — SODIUM CHLORIDE 0.9 % IV SOLN: 250.0000 mL | INTRAVENOUS | Status: DC

## 2017-02-13 MED ORDER — ONDANSETRON HCL 4 MG PO TABS: 4.0000 mg | ORAL_TABLET | Freq: Four times a day (QID) | ORAL | Status: DC | PRN

## 2017-02-13 MED ORDER — THROMBIN (RECOMBINANT) 5000 UNITS EX SOLR
OROMUCOSAL | Status: DC | PRN
  Administered 2017-02-13: 5 mL via TOPICAL

## 2017-02-13 MED ORDER — PROPOFOL 500 MG/50ML IV EMUL
INTRAVENOUS | Status: DC | PRN
  Administered 2017-02-13: 25 ug/kg/min via INTRAVENOUS

## 2017-02-13 MED ORDER — SODIUM CHLORIDE 0.9% FLUSH: 3.0000 mL | Freq: Two times a day (BID) | INTRAVENOUS | Status: DC

## 2017-02-13 MED ORDER — CEFAZOLIN SODIUM-DEXTROSE 2-4 GM/100ML-% IV SOLN
2.0000 g | Freq: Three times a day (TID) | INTRAVENOUS | Status: AC
  Administered 2017-02-13 – 2017-02-14 (×2): 2 g via INTRAVENOUS
  Filled 2017-02-13 (×2): qty 100

## 2017-02-13 MED ORDER — FENTANYL CITRATE (PF) 250 MCG/5ML IJ SOLN
INTRAMUSCULAR | Status: AC
  Filled 2017-02-13: qty 5

## 2017-02-13 MED ORDER — BUPIVACAINE HCL (PF) 0.25 % IJ SOLN
INTRAMUSCULAR | Status: DC | PRN
  Administered 2017-02-13: 6 mL

## 2017-02-13 MED ORDER — ROCURONIUM BROMIDE 10 MG/ML (PF) SYRINGE
PREFILLED_SYRINGE | INTRAVENOUS | Status: DC | PRN
  Administered 2017-02-13: 40 mg via INTRAVENOUS

## 2017-02-13 MED ORDER — ONDANSETRON HCL 4 MG/2ML IJ SOLN: 4.0000 mg | Freq: Four times a day (QID) | INTRAMUSCULAR | Status: DC | PRN

## 2017-02-13 MED ORDER — SODIUM CHLORIDE 0.9 % IR SOLN
Status: DC | PRN
  Administered 2017-02-13: 500 mL

## 2017-02-13 MED ORDER — DEXAMETHASONE SODIUM PHOSPHATE 10 MG/ML IJ SOLN
INTRAMUSCULAR | Status: AC
  Filled 2017-02-13: qty 1

## 2017-02-13 MED ORDER — LIDOCAINE 2% (20 MG/ML) 5 ML SYRINGE
INTRAMUSCULAR | Status: DC | PRN
  Administered 2017-02-13: 60 mg via INTRAVENOUS

## 2017-02-13 MED ORDER — POTASSIUM CHLORIDE IN NACL 20-0.9 MEQ/L-% IV SOLN: INTRAVENOUS | Status: DC

## 2017-02-13 SURGICAL SUPPLY — 54 items
APL SKNCLS STERI-STRIP NONHPOA (GAUZE/BANDAGES/DRESSINGS) ×1
BAG DECANTER FOR FLEXI CONT (MISCELLANEOUS) ×3 IMPLANT
BENZOIN TINCTURE PRP APPL 2/3 (GAUZE/BANDAGES/DRESSINGS) ×3 IMPLANT
BUR MATCHSTICK NEURO 3.0 LAGG (BURR) ×3 IMPLANT
CANISTER SUCT 3000ML PPV (MISCELLANEOUS) ×3 IMPLANT
CARTRIDGE OIL MAESTRO DRILL (MISCELLANEOUS) ×1 IMPLANT
CLOSURE STERI-STRIP 1/2X4 (GAUZE/BANDAGES/DRESSINGS) ×1
CLOSURE WOUND 1/2 X4 (GAUZE/BANDAGES/DRESSINGS) ×1
CLSR STERI-STRIP ANTIMIC 1/2X4 (GAUZE/BANDAGES/DRESSINGS) ×1 IMPLANT
DIFFUSER DRILL AIR PNEUMATIC (MISCELLANEOUS) ×3 IMPLANT
DRAPE LAPAROTOMY 100X72X124 (DRAPES) ×3 IMPLANT
DRAPE MICROSCOPE LEICA (MISCELLANEOUS) ×3 IMPLANT
DRAPE POUCH INSTRU U-SHP 10X18 (DRAPES) ×3 IMPLANT
DRAPE SURG 17X23 STRL (DRAPES) ×3 IMPLANT
DRSG OPSITE POSTOP 3X4 (GAUZE/BANDAGES/DRESSINGS) ×2 IMPLANT
DURAPREP 26ML APPLICATOR (WOUND CARE) ×3 IMPLANT
ELECT REM PT RETURN 9FT ADLT (ELECTROSURGICAL) ×3
ELECTRODE REM PT RTRN 9FT ADLT (ELECTROSURGICAL) ×1 IMPLANT
GAUZE SPONGE 4X4 16PLY XRAY LF (GAUZE/BANDAGES/DRESSINGS) IMPLANT
GLOVE BIO SURGEON STRL SZ7 (GLOVE) IMPLANT
GLOVE BIO SURGEON STRL SZ8 (GLOVE) ×3 IMPLANT
GLOVE BIOGEL PI IND STRL 6.5 (GLOVE) IMPLANT
GLOVE BIOGEL PI IND STRL 7.0 (GLOVE) IMPLANT
GLOVE BIOGEL PI IND STRL 7.5 (GLOVE) IMPLANT
GLOVE BIOGEL PI INDICATOR 6.5 (GLOVE) ×2
GLOVE BIOGEL PI INDICATOR 7.0 (GLOVE)
GLOVE BIOGEL PI INDICATOR 7.5 (GLOVE) ×2
GLOVE INDICATOR 7.0 STRL GRN (GLOVE) ×2 IMPLANT
GOWN STRL REUS W/ TWL LRG LVL3 (GOWN DISPOSABLE) IMPLANT
GOWN STRL REUS W/ TWL XL LVL3 (GOWN DISPOSABLE) ×1 IMPLANT
GOWN STRL REUS W/TWL 2XL LVL3 (GOWN DISPOSABLE) IMPLANT
GOWN STRL REUS W/TWL LRG LVL3 (GOWN DISPOSABLE)
GOWN STRL REUS W/TWL XL LVL3 (GOWN DISPOSABLE) ×3
HEMOSTAT POWDER KIT SURGIFOAM (HEMOSTASIS) ×2 IMPLANT
KIT BASIN OR (CUSTOM PROCEDURE TRAY) ×3 IMPLANT
KIT ROOM TURNOVER OR (KITS) ×3 IMPLANT
NDL HYPO 25X1 1.5 SAFETY (NEEDLE) ×1 IMPLANT
NDL SPNL 20GX3.5 QUINCKE YW (NEEDLE) IMPLANT
NEEDLE HYPO 25X1 1.5 SAFETY (NEEDLE) ×3 IMPLANT
NEEDLE SPNL 20GX3.5 QUINCKE YW (NEEDLE) IMPLANT
NS IRRIG 1000ML POUR BTL (IV SOLUTION) ×3 IMPLANT
OIL CARTRIDGE MAESTRO DRILL (MISCELLANEOUS) ×3
PACK LAMINECTOMY NEURO (CUSTOM PROCEDURE TRAY) ×3 IMPLANT
PAD ARMBOARD 7.5X6 YLW CONV (MISCELLANEOUS) ×9 IMPLANT
RUBBERBAND STERILE (MISCELLANEOUS) ×6 IMPLANT
SPONGE SURGIFOAM ABS GEL SZ50 (HEMOSTASIS) ×2 IMPLANT
STRIP CLOSURE SKIN 1/2X4 (GAUZE/BANDAGES/DRESSINGS) ×2 IMPLANT
SUT VIC AB 0 CT1 18XCR BRD8 (SUTURE) ×1 IMPLANT
SUT VIC AB 0 CT1 8-18 (SUTURE) ×3
SUT VIC AB 2-0 CP2 18 (SUTURE) ×3 IMPLANT
SUT VIC AB 3-0 SH 8-18 (SUTURE) ×3 IMPLANT
TOWEL GREEN STERILE (TOWEL DISPOSABLE) ×3 IMPLANT
TOWEL GREEN STERILE FF (TOWEL DISPOSABLE) ×3 IMPLANT
WATER STERILE IRR 1000ML POUR (IV SOLUTION) ×3 IMPLANT

## 2017-02-13 NOTE — Anesthesia Preprocedure Evaluation (Addendum)
Anesthesia Evaluation  Patient identified by MRN, date of birth, ID band Patient awake    Reviewed: Allergy & Precautions, H&P , NPO status , Patient's Chart, lab work & pertinent test results  History of Anesthesia Complications Negative for: history of anesthetic complications  Airway Mallampati: III   Neck ROM: Full    Dental  (+) Edentulous Upper, Edentulous Lower   Pulmonary COPD, former smoker,    breath sounds clear to auscultation       Cardiovascular hypertension, Pt. on medications  Rhythm:Regular Rate:Normal     Neuro/Psych negative neurological ROS     GI/Hepatic negative GI ROS, (+)     substance abuse  alcohol use,   Endo/Other  negative endocrine ROS  Renal/GU negative Renal ROS     Musculoskeletal Gout   Abdominal   Peds  Hematology  (+) anemia , Pernicious anemia   Anesthesia Other Findings   Reproductive/Obstetrics                           Anesthesia Physical  Anesthesia Plan  ASA: III  Anesthesia Plan: General   Post-op Pain Management:    Induction: Intravenous  PONV Risk Score and Plan: 4 or greater and Treatment may vary due to age or medical condition, Ondansetron, Propofol infusion and Dexamethasone  Airway Management Planned: Oral ETT  Additional Equipment: None  Intra-op Plan:   Post-operative Plan: Extubation in OR  Informed Consent: I have reviewed the patients History and Physical, chart, labs and discussed the procedure including the risks, benefits and alternatives for the proposed anesthesia with the patient or authorized representative who has indicated his/her understanding and acceptance.   Dental advisory given  Plan Discussed with: CRNA  Anesthesia Plan Comments:        Anesthesia Quick Evaluation

## 2017-02-13 NOTE — Op Note (Signed)
02/13/2017  2:23 PM  PATIENT:  [name]  [age] y.o. female  PRE-OPERATIVE DIAGNOSIS: Lumbar disc herniation L3-4 on the right with superior free fragment with back and right leg pain  POST-OPERATIVE DIAGNOSIS:  same  PROCEDURE:  Right L3-4 hemilaminectomy, medial facetectomy, and foraminotomy followed by a discectomy L3-4 on the right  SURGEON:  Marikay Alaravid Janelys Glassner, MD  ASSISTANTSAdelene Idler: meyran fnp  ANESTHESIA:   General  EBL: Less than 25 ml  No intake/output data recorded.  BLOOD ADMINISTERED: none  DRAINS: None  SPECIMEN:  none  INDICATION FOR PROCEDURE: This patient presented with severe right leg pain. Imaging showed large herniated disc L3-4 on the right with a superior free fragment. The patient tried conservative measures without relief. Pain was debilitating. Recommended right L3-4 decompression and discectomy. Patient understood the risks, benefits, and alternatives and potential outcomes and wished to proceed.  PROCEDURE DETAILS: The patient was taken to the operating room and after induction of adequate generalized endotracheal anesthesia, the patient was rolled into the prone position on the Wilson frame and all pressure points were padded. The lumbar region was cleaned and then prepped with DuraPrep and draped in the usual sterile fashion. 5 cc of local anesthesia was injected and then a dorsal midline incision was made and carried down to the lumbo sacral fascia. The fascia was opened and the paraspinous musculature was taken down in a subperiosteal fashion to expose L3-4 on the right. Intraoperative x-ray confirmed my level, and then I used a combination of the high-speed drill and the Kerrison punches to perform a hemilaminectomy, medial facetectomy, and foraminotomy at L3-4 on the right. The underlying yellow ligament was opened and removed in a piecemeal fashion to expose the underlying dura and exiting nerve root. I undercut the lateral recess and dissected down until I was  medial to and distal to the pedicle. The nerve root was well decompressed. We then gently retracted the nerve root medially with a retractor, coagulated the epidural venous vasculature, and incised the capsular tissue over the superior free fragment. I removed several pieces of disc herniation from the right lateral recess and underneath the L3 nerve root , and also performed a thorough intradiscal discectomy with pituitary rongeurs, until I had a nice decompression of the nerve root and the midline. I then palpated with a coronary dilator along the nerve root and into the foramen to assure adequate decompression. I felt no more compression of the nerve roots. I irrigated with saline solution containing bacitracin. Achieved hemostasis with bipolar cautery, lined the dura with Gelfoam, and then closed the fascia with 0 Vicryl. I closed the subcutaneous tissues with 2-0 Vicryl and the subcuticular tissues with 3-0 Vicryl. The skin was then closed with benzoin and Steri-Strips. The drapes were removed, a sterile dressing was applied. The patient was awakened from general anesthesia and transferred to the recovery room in stable condition. At the end of the procedure all sponge, needle and instrument counts were correct.    PLAN OF CARE: Admit for overnight observation  PATIENT DISPOSITION:  PACU - hemodynamically stable.   Delay start of Pharmacological VTE agent (>24hrs) due to surgical blood loss or risk of bleeding:  yes

## 2017-02-13 NOTE — Evaluation (Signed)
Physical Therapy Evaluation Patient Details Name: [name] MRN: [id number] DOB: [birth year] Today's Date: 02/13/2017   History of Present Illness  pt is an 84 y/o female with pmh of emphysema, ETOH abuse, HTN, Osteopaorosis, hip replacements, admitted for elective back surgery for R leg pain, s/p R L34 hemilaminectomy, facetectomy/foraminotomy and discectomy.  Clinical Impression  Pt admitted with/for elective back surgery described above.  Pt is at a min guard level after education and practice, but could use another session before d/c.Marland Kitchen  Pt currently limited functionally due to the problems listed below.  (see problems list.)  Pt will benefit from PT to maximize function and safety to be able to get home safely with available assist.     Follow Up Recommendations No PT follow up    Equipment Recommendations  None recommended by PT    Recommendations for Other Services       Precautions / Restrictions Precautions Precautions: Back Restrictions Weight Bearing Restrictions: No      Mobility  Bed Mobility Overal bed mobility: Needs Assistance Bed Mobility: Rolling;Sidelying to Sit;Sit to Sidelying Rolling: Min guard Sidelying to sit: Min guard     Sit to sidelying: Min guard General bed mobility comments: cues and practice allowed pt to attain min guard.  Transfers Overall transfer level: Needs assistance Equipment used: Rolling walker (2 wheeled) Transfers: Sit to/from Stand Sit to Stand: Supervision         General transfer comment: cues for hand placement and safety  Ambulation/Gait Ambulation/Gait assistance: Min guard Ambulation Distance (Feet): 100 Feet Assistive device: Rolling walker (2 wheeled) Gait Pattern/deviations: Step-through pattern   Gait velocity interpretation: Below normal speed for age/gender General Gait Details: short steady steps.  Stairs            Wheelchair Mobility    Modified Rankin (Stroke Patients Only)        Balance Overall balance assessment: No apparent balance deficits (not formally assessed)                                           Pertinent Vitals/Pain Pain Assessment: Faces Faces Pain Scale: Hurts little more Pain Location: R leg and buttock Pain Descriptors / Indicators: Aching;Discomfort;Guarding Pain Intervention(s): Monitored during session    Home Living Family/patient expects to be discharged to:: Private residence Living Arrangements: Spouse/significant other Available Help at Discharge: Family;Available PRN/intermittently(spouse has recently fallen and is able to assist) Type of Home: House Home Access: Stairs to enter Entrance Stairs-Rails: Doctor, general practice of Steps: 2 Home Layout: One level Home Equipment: Walker - 2 wheels;Bedside commode;Tub bench Additional Comments: has been doing her own ADL's    Prior Function Level of Independence: Independent with assistive device(s)         Comments: drives/runs errands.     Hand Dominance        Extremity/Trunk Assessment        Lower Extremity Assessment Lower Extremity Assessment: Overall WFL for tasks assessed(proximal weaknesses bil)       Communication   Communication: No difficulties  Cognition Arousal/Alertness: Awake/alert Behavior During Therapy: WFL for tasks assessed/performed Overall Cognitive Status: Within Functional Limits for tasks assessed  General Comments General comments (skin integrity, edema, etc.): pt/dtr advised on back care/precautions incl bed mobility, lifting precautions and progression of activity.    Exercises     Assessment/Plan    PT Assessment Patient needs continued PT services  PT Problem List Decreased strength;Decreased activity tolerance;Decreased mobility;Decreased knowledge of use of DME;Decreased knowledge of precautions       PT Treatment Interventions DME  instruction;Gait training;Stair training;Functional mobility training;Therapeutic activities;Cognitive remediation    PT Goals (Current goals can be found in the Care Plan section)  Acute Rehab PT Goals Patient Stated Goal: home and able to do for myself, husband and dog PT Goal Formulation: With patient Time For Goal Achievement: 02/20/17 Potential to Achieve Goals: Good    Frequency Min 5X/week   Barriers to discharge        Co-evaluation               AM-PAC PT "6 Clicks" Daily Activity  Outcome Measure Difficulty turning over in bed (including adjusting bedclothes, sheets and blankets)?: A Little Difficulty moving from lying on back to sitting on the side of the bed? : A Little Difficulty sitting down on and standing up from a chair with arms (e.g., wheelchair, bedside commode, etc,.)?: A Little Help needed moving to and from a bed to chair (including a wheelchair)?: A Little Help needed walking in hospital room?: A Little Help needed climbing 3-5 steps with a railing? : A Little 6 Click Score: 18    End of Session   Activity Tolerance: Patient tolerated treatment well Patient left: in bed;with call bell/phone within reach;with family/visitor present Nurse Communication: Mobility status PT Visit Diagnosis: Other abnormalities of gait and mobility (R26.89);Muscle weakness (generalized) (M62.81)    Time: [id number] PT Time Calculation (min) (ACUTE ONLY): 32 min   Charges:   PT Evaluation $PT Eval Moderate Complexity: 1 Mod PT Treatments $Gait Training: 8-22 mins   PT G Codes:        02/13/2017   BingKen Shelia Magallon, PT [phone]  (pager)  Eliseo GumKenneth V Maxen Rowland 02/13/2017, 6:32 PM

## 2017-02-13 NOTE — Anesthesia Postprocedure Evaluation (Signed)
Anesthesia Post Note  Patient: [name]  Procedure(s) Performed: Microdiscectomy - right - Lumbar three-Lumbar four (Right Back)     Patient location during evaluation: PACU Anesthesia Type: General Level of consciousness: awake and alert Pain management: pain level controlled Vital Signs Assessment: post-procedure vital signs reviewed and stable Respiratory status: spontaneous breathing, nonlabored ventilation and respiratory function stable Cardiovascular status: blood pressure returned to baseline and stable Postop Assessment: no apparent nausea or vomiting Anesthetic complications: no    Last Vitals:  Vitals:   02/13/17 1530 02/13/17 1534  BP:  (!) 148/68  Pulse: 72   Resp: (!) 25 (!) 36  Temp:    SpO2: 100%     Last Pain:  Vitals:   02/13/17 1534  TempSrc:   PainSc: 5                  Beryle Lathehomas E Vastie Douty

## 2017-02-13 NOTE — Anesthesia Procedure Notes (Signed)
Procedure Name: Intubation Date/Time: 02/13/2017 1:25 PM Performed by: Bryson Corona, CRNA Pre-anesthesia Checklist: Patient identified, Emergency Drugs available, Suction available and Patient being monitored Patient Re-evaluated:Patient Re-evaluated prior to induction Oxygen Delivery Method: Circle System Utilized Preoxygenation: Pre-oxygenation with 100% oxygen Induction Type: IV induction Ventilation: Mask ventilation without difficulty Laryngoscope Size: Mac and 3 Grade View: Grade I Tube type: Oral Tube size: 7.0 mm Number of attempts: 1 Airway Equipment and Method: Stylet and Oral airway Placement Confirmation: ETT inserted through vocal cords under direct vision,  positive ETCO2 and breath sounds checked- equal and bilateral Secured at: 22 cm Tube secured with: Tape Dental Injury: Teeth and Oropharynx as per pre-operative assessment

## 2017-02-13 NOTE — Plan of Care (Signed)
  Progressing Activity: Ability to avoid complications of mobility impairment will improve 02/13/2017 2346 - Progressing by Mel AlmondAguilar, Haeven Nickle D, RN Ability to tolerate increased activity will improve 02/13/2017 2346 - Progressing by Mel AlmondAguilar, Shaneque Merkle D, RN Will remain free from falls 02/13/2017 2346 - Progressing by Mel AlmondAguilar, Katora Fini D, RN Education: Ability to verbalize activity precautions or restrictions will improve 02/13/2017 2346 - Progressing by Threasa BeardsAguilar, Vernesha Talbot D, RN Knowledge of the prescribed therapeutic regimen will improve 02/13/2017 2346 - Progressing by Mel AlmondAguilar, Qasim Diveley D, RN Understanding of discharge needs will improve 02/13/2017 2346 - Progressing by Mel AlmondAguilar, Janeene Sand D, RN Physical Regulation: Ability to maintain clinical measurements within normal limits will improve 02/13/2017 2346 - Progressing by Mel AlmondAguilar, Pookela Sellin D, RN Postoperative complications will be avoided or minimized 02/13/2017 2346 - Progressing by Mel AlmondAguilar, Temeka Pore D, RN Diagnostic test results will improve 02/13/2017 2346 - Progressing by Mel AlmondAguilar, Icker Swigert D, RN Pain Management: Pain level will decrease 02/13/2017 2346 - Progressing by Threasa BeardsAguilar, Camia Dipinto D, RN Health Behavior/Discharge Planning: Identification of resources available to assist in meeting health care needs will improve 02/13/2017 2346 - Progressing by Mel AlmondAguilar, Kaelan Amble D, RN

## 2017-02-13 NOTE — Transfer of Care (Signed)
Immediate Anesthesia Transfer of Care Note  Patient: [name] LevansRebecca T [name]  Procedure(s) Performed: Microdiscectomy - right - Lumbar three-Lumbar four (Right Back)  Patient Location: PACU  Anesthesia Type:General  Level of Consciousness: awake, alert  and oriented  Airway & Oxygen Therapy: Patient Spontanous Breathing and Patient connected to face mask oxygen  Post-op Assessment: Report given to RN and Post -op Vital signs reviewed and stable  Post vital signs: Reviewed and stable  Last Vitals:  Vitals:   02/13/17 1050  BP: (!) 172/48  Pulse: 72  Resp: 18  Temp: (!) 36.3 C  SpO2: 99%    Last Pain:  Vitals:   02/13/17 1110  TempSrc:   PainSc: 3          Complications: No apparent anesthesia complications

## 2017-02-13 NOTE — H&P (Signed)
Subjective: Patient is a 84 y.o. female admitted for right leg pain. Onset of symptoms was several weeks ago, gradually worsening since that time.  The pain is rated severe, and is located at the across the lower back and radiates to R leg. The pain is described as aching and occurs all day. The symptoms have been progressive. Symptoms are exacerbated by exercise. MRI or CT showed HNP L3-4 r   Past Medical History:  Diagnosis Date  . Alcohol abuse   . Emphysema of lung (HCC) 01/26/2009   pt. unsure of this reports as of 07/31/2012  . Fracture of fifth metatarsal bone of right foot 08/29/2012  . Gout 08/11/2012  . Hyperlipemia 03/27/2007  . Hypertension   . Osteoporosis 07/12/2006  . Pernicious anemia 10/25/2006  . Polymyalgia rheumatica (HCC) 03/27/2007   sees Dr. Kellie Simmeringruslow in rheumatology  . Vitamin D deficiency 07/12/2006    Past Surgical History:  Procedure Laterality Date  . APPENDECTOMY  07/12/2006  . PARTIAL HIP ARTHROPLASTY     left side- 2x's ( 2nd surgery to correct 1st replacement), R side -   . TOOTH EXTRACTION N/A 08/04/2012   Procedure: EXTRACTIONS 17, 20;  Surgeon: Georgia LopesScott M Jensen, DDS;  Location: [facility] OR;  Service: Oral Surgery;  Laterality: N/A;    Prior to Admission medications   Medication Sig Start Date End Date Taking? Authorizing Provider  acetaminophen (TYLENOL) 500 MG tablet Take 500 mg by mouth every 8 (eight) hours as needed for moderate pain. Take with tramadol   Yes [provider]  alendronate (FOSAMAX) 70 MG tablet Take 1 tablet (70 mg total) by mouth every 7 (seven) days. 10/18/16  Yes Terressa KoyanagiKim, Hannah R, DO  Cholecalciferol (VITAMIN D) 2000 units CAPS Take 4,000 Units by mouth daily.   Yes [provider]  Cyanocobalamin (VITAMIN B-12) 1000 MCG SUBL Place 1,000 mcg under the tongue daily.    Yes [provider]  diphenhydrAMINE (BENADRYL) 25 MG tablet Take 25 mg by mouth at bedtime as needed for sleep.   Yes [provider]   hydrochlorothiazide (HYDRODIURIL) 12.5 MG tablet TAKE ONE TABLET BY MOUTH ONCE DAILY 10/15/16  Yes Terressa KoyanagiKim, Hannah R, DO  Oyster Shell (OYSTER CALCIUM) 500 MG TABS tablet Take 1,000 mg of elemental calcium by mouth daily.   Yes [provider]  predniSONE (DELTASONE) 5 MG tablet Take 0.5 tablets (2.5 mg total) by mouth daily. Patient taking differently: Take 2.5 mg by mouth every other day. On even number days 10/18/16  Yes Kriste BasqueKim, Hannah R, DO  vitamin C (ASCORBIC ACID) 250 MG tablet Take 500 mg by mouth daily.   Yes [provider]  traMADol (ULTRAM) 50 MG tablet Take 50 mg by mouth every 8 (eight) hours as needed for pain. 02/01/17   [provider]   Allergies  Allergen Reactions  . Aspirin Other (See Comments)    "threw up blood"  . Codeine Other (See Comments)    "threw up blood"  . Naproxen Other (See Comments)    Severe pain in joints and hands  . Oxycodone-Acetaminophen     REACTION: gastric reaction    Social History   Tobacco Use  . Smoking status: Former Smoker    Packs/day: 2.00    Years: 58.00    Pack years: 116.00    Start date: 01/23/1948    Last attempt to quit: 07/12/2006    Years since quitting: 10.6  . Smokeless tobacco: Never Used  Substance Use Topics  .  Alcohol use: No    Alcohol/week: 6.0 oz    Types: 10 Shots of liquor per week    Frequency: Never    Comment: quit completely - 2012    Family History  Problem Relation Age of Onset  . Mental illness Mother        alzheimer dementia  . Ulcers Father   . Heart disease Father   . Cancer Father        lung, smoker     Review of Systems  Positive ROS: neg  All other systems have been reviewed and were otherwise negative with the exception of those mentioned in the HPI and as above.  Objective: Vital signs in last 24 hours:    General Appearance: Alert, cooperative, no distress, appears stated age Head: Normocephalic, without obvious abnormality, atraumatic Eyes: PERRL,  conjunctiva/corneas clear, EOM's intact    Neck: Supple, symmetrical, trachea midline Back: Symmetric, no curvature, ROM normal, no CVA tenderness Lungs:  respirations unlabored Heart: Regular rate and rhythm Abdomen: Soft, non-tender Extremities: Extremities normal, atraumatic, no cyanosis or edema Pulses: 2+ and symmetric all extremities Skin: Skin color, texture, turgor normal, no rashes or lesions  NEUROLOGIC:   Mental status: Alert and oriented x4,  no aphasia, good attention span, fund of knowledge, and memory Motor Exam - grossly normal Sensory Exam - grossly normal Reflexes: 1+ Coordination - grossly normal Gait - grossly normal Balance - grossly normal Cranial Nerves: I: smell Not tested  II: visual acuity  OS: nl    OD: nl  II: visual fields Full to confrontation  II: pupils Equal, round, reactive to light  III,VII: ptosis None  III,IV,VI: extraocular muscles  Full ROM  V: mastication Normal  V: facial light touch sensation  Normal  V,VII: corneal reflex  Present  VII: facial muscle function - upper  Normal  VII: facial muscle function - lower Normal  VIII: hearing Not tested  IX: soft palate elevation  Normal  IX,X: gag reflex Present  XI: trapezius strength  5/5  XI: sternocleidomastoid strength 5/5  XI: neck flexion strength  5/5  XII: tongue strength  Normal    Data Review Lab Results  Component Value Date   WBC 7.6 02/11/2017   HGB 14.0 02/11/2017   HCT 41.7 02/11/2017   MCV 98.6 02/11/2017   PLT 239 02/11/2017   Lab Results  Component Value Date   NA 137 02/11/2017   K 3.7 02/11/2017   CL 99 (L) 02/11/2017   CO2 27 02/11/2017   BUN 13 02/11/2017   CREATININE 1.09 (H) 02/11/2017   GLUCOSE 102 (H) 02/11/2017   Lab Results  Component Value Date   INR 0.98 02/11/2017    Assessment/Plan: Patient admitted for R L3-4 microdiskectomy. Patient has failed a reasonable attempt at conservative therapy.  I explained the condition and procedure to  the patient and answered any questions.  Patient wishes to proceed with procedure as planned. Understands risks/ benefits and typical outcomes of procedure.   JONES,DAVID S 02/13/2017 10:39 AM

## 2017-02-14 ENCOUNTER — Other Ambulatory Visit: Payer: Self-pay

## 2017-02-14 ENCOUNTER — Encounter (HOSPITAL_COMMUNITY): Payer: Self-pay | Admitting: Neurological Surgery

## 2017-02-14 DIAGNOSIS — E559 Vitamin D deficiency, unspecified: Secondary | ICD-10-CM | POA: Diagnosis not present

## 2017-02-14 DIAGNOSIS — M109 Gout, unspecified: Secondary | ICD-10-CM | POA: Diagnosis not present

## 2017-02-14 DIAGNOSIS — M5116 Intervertebral disc disorders with radiculopathy, lumbar region: Secondary | ICD-10-CM | POA: Diagnosis not present

## 2017-02-14 DIAGNOSIS — E785 Hyperlipidemia, unspecified: Secondary | ICD-10-CM | POA: Diagnosis not present

## 2017-02-14 DIAGNOSIS — I10 Essential (primary) hypertension: Secondary | ICD-10-CM | POA: Diagnosis not present

## 2017-02-14 DIAGNOSIS — M353 Polymyalgia rheumatica: Secondary | ICD-10-CM | POA: Diagnosis not present

## 2017-02-14 MED ORDER — TRAMADOL HCL 50 MG PO TABS: 50.0000 mg | ORAL_TABLET | Freq: Three times a day (TID) | ORAL | 0 refills | Status: DC | PRN

## 2017-02-14 MED FILL — Thrombin For Soln 5000 Unit: CUTANEOUS | Qty: 5000 | Status: AC

## 2017-02-14 NOTE — Discharge Summary (Signed)
Physician Discharge Summary  Patient ID: [name] MRN: [id number] DOB/AGE: 1928-04-24 84 y.o.  Admit date: 02/13/2017 Discharge date: 02/14/2017  Admission Diagnoses: HNP L3-4    Discharge Diagnoses: same   Discharged Condition: good  Hospital Course: The patient was admitted on 02/13/2017 and taken to the operating room where the patient underwent L3-4 microdiskectomy. The patient tolerated the procedure well and was taken to the recovery room and then to the floor in stable condition. The hospital course was routine. There were no complications. The wound remained clean dry and intact. Pt had appropriate back soreness. No complaints of leg pain or new N/T/W. The patient remained afebrile with stable vital signs, and tolerated a regular diet. The patient continued to increase activities, and pain was well controlled with oral pain medications.   Consults: None  Significant Diagnostic Studies:  Results for orders placed or performed during the hospital encounter of 02/11/17  Surgical pcr screen  Result Value Ref Range   MRSA, PCR NEGATIVE NEGATIVE   Staphylococcus aureus NEGATIVE NEGATIVE  Basic metabolic panel  Result Value Ref Range   Sodium 137 135 - 145 mmol/L   Potassium 3.7 3.5 - 5.1 mmol/L   Chloride 99 (L) 101 - 111 mmol/L   CO2 27 22 - 32 mmol/L   Glucose, Bld 102 (H) 65 - 99 mg/dL   BUN 13 6 - 20 mg/dL   Creatinine, Ser 8.29 (H) 0.44 - 1.00 mg/dL   Calcium 9.2 8.9 - 56.2 mg/dL   GFR calc non Af Amer 45 (L) >60 mL/min   GFR calc Af Amer 52 (L) >60 mL/min   Anion gap 11 5 - 15  CBC WITH DIFFERENTIAL  Result Value Ref Range   WBC 7.6 4.0 - 10.5 K/uL   RBC 4.23 3.87 - 5.11 MIL/uL   Hemoglobin 14.0 12.0 - 15.0 g/dL   HCT 13.0 86.5 - 78.4 %   MCV 98.6 78.0 - 100.0 fL   MCH 33.1 26.0 - 34.0 pg   MCHC 33.6 30.0 - 36.0 g/dL   RDW 69.6 29.5 - 28.4 %   Platelets 239 150 - 400 K/uL   Neutrophils Relative % 65 %   Neutro Abs 4.9 1.7 - 7.7 K/uL   Lymphocytes  Relative 24 %   Lymphs Abs 1.8 0.7 - 4.0 K/uL   Monocytes Relative 8 %   Monocytes Absolute 0.6 0.1 - 1.0 K/uL   Eosinophils Relative 3 %   Eosinophils Absolute 0.2 0.0 - 0.7 K/uL   Basophils Relative 0 %   Basophils Absolute 0.0 0.0 - 0.1 K/uL  Protime-INR  Result Value Ref Range   Prothrombin Time 12.9 11.4 - 15.2 seconds   INR 0.98     Chest 2 View  Result Date: 02/11/2017 CLINICAL DATA:  Preoperative chest x-ray for upcoming lumbar surgery EXAM: CHEST  2 VIEW COMPARISON:  07/31/2012 FINDINGS: The heart size and mediastinal contours are within normal limits. Both lungs are clear. The visualized skeletal structures are unremarkable. Aortic atherosclerotic disease is again noted. IMPRESSION: No active cardiopulmonary disease. Aortic Atherosclerosis (ICD10-170.0) Electronically Signed   By: Alcide Clever M.D.   On: 02/11/2017 15:35   Mr Lumbar Spine Wo Contrast  Result Date: 01/30/2017 CLINICAL DATA:  Right hip pain and back pain. EXAM: MRI LUMBAR SPINE WITHOUT CONTRAST TECHNIQUE: Multiplanar, multisequence MR imaging of the lumbar spine was performed. No intravenous contrast was administered. COMPARISON:  Sacroiliac joint radiograph 08/30/2016 FINDINGS: Segmentation:  Normal Alignment:  Normal Vertebrae: No acute  compression fracture, discitis-osteomyelitis, facet edema or other focal marrow lesion. No epidural collection. Conus medullaris and cauda equina: Conus extends to the L1 level. Conus and cauda equina appear normal. Paraspinal and other soft tissues: Visualized paraspinal soft tissues are normal. There is cholelithiasis. Common bile duct is dilated measuring up to 11 mm. Disc levels: There is no significant disc herniation, spinal canal stenosis or neural foraminal stenosis at the levels from T10-T11 to L1-L2. L2-L3: Mild disc bulge without stenosis. L3-L4: Large right subarticular disc extrusion with superior migration to the level of the L3 pedicle. This causes narrowing of the right  lateral recess and posteriorly displaces the descending right L4 nerve roots. No central spinal canal stenosis. There is moderate narrowing of the right neural foramen. No left foraminal stenosis. L4-L5: Medium size diffuse disc bulge. No spinal canal stenosis. Mild right foraminal stenosis. L5-S1: Disc space narrowing with endplate spurring. No spinal canal stenosis. Moderate bilateral foraminal stenosis. IMPRESSION: 1. L3-L4 large right subarticular disc extrusion with superior migration to the L3 pedicle level with mass effect on the descending right L4 nerve root. Moderate right neural foraminal narrowing may also affect the right L3 nerve root. 2. L5-S1 moderate bilateral neuroforaminal stenosis secondary to endplate spurring. 3. Incidentally noted cholelithiasis and dilated common bile duct. Consider further evaluation with abdominal ultrasound or MRI/MRCP if clinically indicated. Electronically Signed   By: Deatra RobinsonKevin  Herman M.D.   On: 01/30/2017 03:13   Mr Pelvis Wo Contrast  Result Date: 01/23/2017 CLINICAL DATA:  Right-sided pelvic pain with right hip, groin, and buttock pain that radiates into the right leg. EXAM: MRI PELVIS WITHOUT CONTRAST TECHNIQUE: Multiplanar multisequence MR imaging of the pelvis was performed. No intravenous contrast was administered. COMPARISON:  Radiographs dated 08/30/2016 and 07/02/2010 FINDINGS: Musculoskeletal: The pelvic bones appear normal including the sacroiliac joints. Bilateral hip replacements obscure detail at both hip joints. However, there is no evidence of bursitis or joint effusions or soft tissue masses surrounding either hip. Urinary Tract:  Normal. Bowel: Multiple diverticula in the distal colon. No diverticulitis. Vascular/Lymphatic: No adenopathy or masses. Reproductive: 2 small fibroids in the otherwise normal uterus. Ovaries are normal. Other: The muscles of the pelvis and proximal thighs demonstrate diffuse symmetrical slight atrophy. IMPRESSION: 1. No  acute abnormalities of the pelvis or hips. 2. Bones of the pelvis appear normal. 3. Sigmoid diverticulosis. Electronically Signed   By: Francene BoyersJames  Maxwell M.D.   On: 01/23/2017 11:20   Dg Lumbar Spine 1 View  Result Date: 02/13/2017 CLINICAL DATA:  Lumbar discectomy. EXAM: LUMBAR SPINE - 1 VIEW COMPARISON:  01/29/2017 lumbar spine MRI. FINDINGS: A single portable cross-table lateral radiograph of the lumbar spine is provided. The tip of a surgical instrument projects at the posterior L3-4 facet joint level, directed towards the L3-4 disc space. IMPRESSION: Intraoperative localization as above. Electronically Signed   By: Sebastian AcheAllen  Grady M.D.   On: 02/13/2017 16:32    Antibiotics:  Anti-infectives (From admission, onward)   Start     Dose/Rate Route Frequency Ordered Stop   02/13/17 2100  ceFAZolin (ANCEF) IVPB 2g/100 mL premix     2 g 200 mL/hr over 30 Minutes Intravenous Every 8 hours 02/13/17 1619 02/14/17 0547   02/13/17 1354  bacitracin 50,000 Units in sodium chloride irrigation 0.9 % 500 mL irrigation  Status:  Discontinued       As needed 02/13/17 1354 02/13/17 1428   02/13/17 1059  ceFAZolin (ANCEF) IVPB 2g/100 mL premix     2 g 200  mL/hr over 30 Minutes Intravenous On call to O.R. 02/13/17 1059 02/13/17 1343      Discharge Exam: Blood pressure (!) 100/52, pulse 78, temperature 98 F (36.7 C), temperature source Oral, resp. rate 18, height 5\' 1"  (1.549 m), weight 47.7 kg (105 lb 3.2 oz), SpO2 95 %. Neurologic: Grossly normal Incision CDI  Discharge Medications:   Allergies as of 02/14/2017      Reactions   Aspirin Other (See Comments)   "threw up blood"   Codeine Other (See Comments)   "threw up blood"   Naproxen Other (See Comments)   Severe pain in joints and hands   Oxycodone-acetaminophen    REACTION: gastric reaction      Medication List    TAKE these medications   acetaminophen 500 MG tablet Commonly known as:  TYLENOL Take 500 mg by mouth every 8 (eight) hours as  needed for moderate pain. Take with tramadol   alendronate 70 MG tablet Commonly known as:  FOSAMAX Take 1 tablet (70 mg total) by mouth every 7 (seven) days.   diphenhydrAMINE 25 MG tablet Commonly known as:  BENADRYL Take 25 mg by mouth at bedtime as needed for sleep.   hydrochlorothiazide 12.5 MG tablet Commonly known as:  HYDRODIURIL TAKE ONE TABLET BY MOUTH ONCE DAILY   oyster calcium 500 MG Tabs tablet Take 1,000 mg of elemental calcium by mouth daily.   predniSONE 5 MG tablet Commonly known as:  DELTASONE Take 0.5 tablets (2.5 mg total) by mouth daily. What changed:    when to take this  additional instructions   traMADol 50 MG tablet Commonly known as:  ULTRAM Take 1 tablet (50 mg total) by mouth every 8 (eight) hours as needed. What changed:  reasons to take this   Vitamin B-12 1000 MCG Subl Place 1,000 mcg under the tongue daily.   vitamin C 250 MG tablet Commonly known as:  ASCORBIC ACID Take 500 mg by mouth daily.   Vitamin D 2000 units Caps Take 4,000 Units by mouth daily.       Disposition: home   Final Dx: microdiskectomy  Discharge Instructions     Remove dressing in 72 hours   Complete by:  As directed    Call MD for:  difficulty breathing, headache or visual disturbances   Complete by:  As directed    Call MD for:  persistant nausea and vomiting   Complete by:  As directed    Call MD for:  redness, tenderness, or signs of infection (pain, swelling, redness, odor or green/yellow discharge around incision site)   Complete by:  As directed    Call MD for:  severe uncontrolled pain   Complete by:  As directed    Call MD for:  temperature >100.4   Complete by:  As directed    Diet - low sodium heart healthy   Complete by:  As directed    Increase activity slowly   Complete by:  As directed       Follow-up Information    Tia Alert, MD. Schedule an appointment as soon as possible for a visit in 2 week(s).   Specialty:   Neurosurgery Contact information: [address] [place] [address] [phone]            Signed: Tia Alert 02/14/2017, 8:43 AM

## 2017-02-14 NOTE — Progress Notes (Signed)
Physical Therapy Treatment Patient Details Name: [name] LevansRebecca T [name] MRN: [id number] DOB: 08-Aug-1928 Today's Date: 02/14/2017    History of Present Illness pt is an 84 y/o female with pmh of emphysema, ETOH abuse, HTN, Osteopaorosis, hip replacements, admitted for elective back surgery for R leg pain, s/p R L34 hemilaminectomy, facetectomy/foraminotomy and discectomy.    PT Comments    Pt making good progress with functional mobility and successfully completed stair training this session. Plan is for pt to d/c home today. PT will continue to follow acutely.    Follow Up Recommendations  No PT follow up     Equipment Recommendations  None recommended by PT    Recommendations for Other Services       Precautions / Restrictions Precautions Precautions: Back Precaution Comments: Reviewed 3/3 back precautions with pt throughout session Restrictions Weight Bearing Restrictions: No    Mobility  Bed Mobility               General bed mobility comments: pt OOB in recliner chair upon arrival  Transfers Overall transfer level: Needs assistance Equipment used: Rolling walker (2 wheeled) Transfers: Sit to/from Stand Sit to Stand: Supervision         General transfer comment: good technique, supervision for safety  Ambulation/Gait Ambulation/Gait assistance: Supervision Ambulation Distance (Feet): 150 Feet Assistive device: Rolling walker (2 wheeled) Gait Pattern/deviations: Step-through pattern Gait velocity: decreased Gait velocity interpretation: Below normal speed for age/gender General Gait Details: pt steady with use of RW, no instability or LOB, supervision for safety   Stairs Stairs: Yes   Stair Management: Two rails;Step to pattern;Forwards Number of Stairs: 3 General stair comments: min guard for safety, no instability or LOB; pt slow and cautious  Wheelchair Mobility    Modified Rankin (Stroke Patients Only)       Balance Overall balance  assessment: Needs assistance Sitting-balance support: Feet supported Sitting balance-Leahy Scale: Good     Standing balance support: During functional activity;No upper extremity supported Standing balance-Leahy Scale: Fair                              Cognition Arousal/Alertness: Awake/alert Behavior During Therapy: WFL for tasks assessed/performed Overall Cognitive Status: Within Functional Limits for tasks assessed                                        Exercises      General Comments        Pertinent Vitals/Pain Pain Assessment: No/denies pain    Home Living                      Prior Function            PT Goals (current goals can now be found in the care plan section) Acute Rehab PT Goals PT Goal Formulation: With patient Time For Goal Achievement: 02/20/17 Potential to Achieve Goals: Good Progress towards PT goals: Progressing toward goals    Frequency    Min 5X/week      PT Plan Current plan remains appropriate    Co-evaluation              AM-PAC PT "6 Clicks" Daily Activity  Outcome Measure  Difficulty turning over in bed (including adjusting bedclothes, sheets and blankets)?: A Little Difficulty moving from lying on back to sitting on the  side of the bed? : A Little Difficulty sitting down on and standing up from a chair with arms (e.g., wheelchair, bedside commode, etc,.)?: A Little Help needed moving to and from a bed to chair (including a wheelchair)?: None Help needed walking in hospital room?: None Help needed climbing 3-5 steps with a railing? : A Little 6 Click Score: 20    End of Session   Activity Tolerance: Patient tolerated treatment well Patient left: in chair;with call bell/phone within reach Nurse Communication: Mobility status PT Visit Diagnosis: Other abnormalities of gait and mobility (R26.89);Muscle weakness (generalized) (M62.81)     Time: 1610-9604 PT Time Calculation  (min) (ACUTE ONLY): 15 min  Charges:  $Gait Training: 8-22 mins                    G Codes:       [place], [place], [place] [phone]    Alessandra Bevels Burton Gahan 02/14/2017, 9:25 AM

## 2017-02-14 NOTE — Progress Notes (Signed)
Patient is discharged from room 3C11 at this time. Alert and in stable condition. IV site d/c'd and instructions read to patient and daughter with understanding verbalized. Left unit via wheelchair with all belongings at side. 

## 2017-02-14 NOTE — Evaluation (Signed)
Occupational Therapy Evaluation and Discharge Patient Details Name: [name] LevansRebecca T [name] MRN: [id number] DOB: 13-Aug-1928 Today's Date: 02/14/2017    History of Present Illness pt is an 84 y/o female with pmh of emphysema, ETOH abuse, HTN, Osteopaorosis, hip replacements, admitted for elective back surgery for R leg pain, s/p R L3-4 hemilaminectomy, facetectomy/foraminotomy and discectomy.   Clinical Impression   Pt reports she was modified independent with ADL PTA. Currently pt supervision with ADL and functional mobility with the exception of light min assist for LB dressing. All back, safety, and ADL education completed with pt. Pt planning to d/c home with 24/7 supervision from family. No further acute OT needs identified; signing off at this time. Please re-consult if needs change. Thank you for this referral.    Follow Up Recommendations  No OT follow up;Supervision/Assistance - 24 hour    Equipment Recommendations  None recommended by OT    Recommendations for Other Services       Precautions / Restrictions Precautions Precautions: Back Precaution Booklet Issued: No Precaution Comments: Pt able to recall 3/3 back precautions at start of session Required Braces or Orthoses: (No brace order) Restrictions Weight Bearing Restrictions: No      Mobility Bed Mobility               General bed mobility comments: Pt OOB in chair upon arrival  Transfers Overall transfer level: Needs assistance Equipment used: None Transfers: Sit to/from Stand Sit to Stand: Supervision         General transfer comment: for safety, no physical assist required    Balance Overall balance assessment: Needs assistance Sitting-balance support: Feet supported Sitting balance-Leahy Scale: Normal     Standing balance support: No upper extremity supported;During functional activity Standing balance-Leahy Scale: Fair                             ADL either performed or  assessed with clinical judgement   ADL Overall ADL's : Needs assistance/impaired Eating/Feeding: Set up;Sitting   Grooming: Set up;Sitting Grooming Details (indicate cue type and reason): Educated on use of 2 cups for oral care Upper Body Bathing: Set up;Sitting   Lower Body Bathing: Supervison/ safety;Sit to/from stand   Upper Body Dressing : Set up;Sitting   Lower Body Dressing: Minimal assistance;Sit to/from stand Lower Body Dressing Details (indicate cue type and reason): Pt able to don pants without assist, required assist for socks today. Has sock aide at home that she uses typically. Educated on compensatory strategies for LB ADL Toilet Transfer: Supervision/safety;Ambulation;Regular Toilet;RW     Toileting - Clothing Manipulation Details (indicate cue type and reason): Educated on no twisting during peri care   Tub/Shower Transfer Details (indicate cue type and reason): Pt reports she uses tub bench for tub transfers at home; no questions or concerns regarding tub transfers and safety with bathing Functional mobility during ADLs: Supervision/safety;Rolling walker General ADL Comments: Educated pt on maintainig back precuations during functional activities, keeping frequently used items at counter top height, frequent mobility throughout the day upon return home.     Vision         Perception     Praxis      Pertinent Vitals/Pain Pain Assessment: Faces Faces Pain Scale: Hurts a little bit Pain Location: back Pain Descriptors / Indicators: Discomfort Pain Intervention(s): Monitored during session     Hand Dominance     Extremity/Trunk Assessment Upper Extremity Assessment Upper Extremity Assessment: Overall [facility]  for tasks assessed   Lower Extremity Assessment Lower Extremity Assessment: Defer to PT evaluation   Cervical / Trunk Assessment Cervical / Trunk Assessment: Other exceptions Cervical / Trunk Exceptions: s/p spinal sx   Communication  Communication Communication: HOH   Cognition Arousal/Alertness: Awake/alert Behavior During Therapy: WFL for tasks assessed/performed Overall Cognitive Status: Within Functional Limits for tasks assessed                                     General Comments       Exercises     Shoulder Instructions      Home Living Family/patient expects to be discharged to:: Private residence Living Arrangements: Spouse/significant other Available Help at Discharge: Family;Available 24 hours/day(daughter staying until the end of the month) Type of Home: House Home Access: Stairs to enter [employer] of Steps: 2 Entrance Stairs-Rails: Right;Left Home Layout: One level     Bathroom Shower/Tub: Chief Strategy Officer: Standard     Home Equipment: Environmental consultant - 2 wheels;Bedside commode;Tub bench;Grab bars - tub/shower;Adaptive equipment Adaptive Equipment: Reacher;Sock aid        Prior Functioning/Environment Level of Independence: Independent with assistive device(s)        Comments: RW for mobility, mod I with ADL        OT Problem List:        OT Treatment/Interventions:      OT Goals(Current goals can be found in the care plan section) Acute Rehab OT Goals Patient Stated Goal: home and able to do for myself, husband and dog OT Goal Formulation: All assessment and education complete, DC therapy  OT Frequency:     Barriers to D/C:            Co-evaluation              AM-PAC PT "6 Clicks" Daily Activity     Outcome Measure Help from another person eating meals?: None Help from another person taking care of personal grooming?: A Little Help from another person toileting, which includes using toliet, bedpan, or urinal?: A Little Help from another person bathing (including washing, rinsing, drying)?: A Little Help from another person to put on and taking off regular upper body clothing?: None Help from another person to put on and  taking off regular lower body clothing?: A Little 6 Click Score: 20   End of Session Equipment Utilized During Treatment: Rolling walker Nurse Communication: Mobility status;Other (comment)(no equipment or f/u needs)  Activity Tolerance: Patient tolerated treatment well Patient left: in chair;with call bell/phone within reach  OT Visit Diagnosis: Unsteadiness on feet (R26.81);Pain Pain - part of body: (back)                Time: 9147-8295 OT Time Calculation (min): 23 min Charges:  OT General Charges $OT Visit: 1 Visit OT Evaluation $OT Eval Moderate Complexity: 1 Mod OT Treatments $Self Care/Home Management : 8-22 mins G-Codes:     Sharlyne Koeneman A. Brett Albino, M.S., OTR/L Pager: [phone]  Gaye Alken 02/14/2017, 9:42 AM

## 2017-03-04 ENCOUNTER — Encounter: Payer: Self-pay | Admitting: Family Medicine

## 2017-03-04 ENCOUNTER — Ambulatory Visit (INDEPENDENT_AMBULATORY_CARE_PROVIDER_SITE_OTHER): Payer: Medicare Other | Admitting: Family Medicine

## 2017-03-04 ENCOUNTER — Telehealth: Payer: Self-pay | Admitting: *Deleted

## 2017-03-04 VITALS — BP 100/62

## 2017-03-04 DIAGNOSIS — M353 Polymyalgia rheumatica: Secondary | ICD-10-CM | POA: Diagnosis not present

## 2017-03-04 DIAGNOSIS — M109 Gout, unspecified: Secondary | ICD-10-CM

## 2017-03-04 MED ORDER — COLCHICINE 0.6 MG PO CAPS: ORAL_CAPSULE | ORAL | 0 refills | Status: DC

## 2017-03-04 NOTE — Telephone Encounter (Signed)
Pt seen by Dr. Salomon FickBanks and given rx for colchicine. Nothing further needed at this time.

## 2017-03-04 NOTE — Telephone Encounter (Signed)
Daughter calling again, need call back asap

## 2017-03-04 NOTE — Patient Instructions (Signed)
Gout Gout is painful swelling that can occur in some of your joints. Gout is a type of arthritis. This condition is caused by having too much uric acid in your body. Uric acid is a chemical that forms when your body breaks down substances called purines. Purines are important for building body proteins. When your body has too much uric acid, sharp crystals can form and build up inside your joints. This causes pain and swelling. Gout attacks can happen quickly and be very painful (acute gout). Over time, the attacks can affect more joints and become more frequent (chronic gout). Gout can also cause uric acid to build up under your skin and inside your kidneys. What are the causes? This condition is caused by too much uric acid in your blood. This can occur because:  Your kidneys do not remove enough uric acid from your blood. This is the most common cause.  Your body makes too much uric acid. This can occur with some cancers and cancer treatments. It can also occur if your body is breaking down too many red blood cells (hemolytic anemia).  You eat too many foods that are high in purines. These foods include organ meats and some seafood. Alcohol, especially beer, is also high in purines.  A gout attack may be triggered by trauma or stress. What increases the risk? This condition is more likely to develop in people who:  Have a family history of gout.  Are female and middle-aged.  Are female and have gone through menopause.  Are obese.  Frequently drink alcohol, especially beer.  Are dehydrated.  Lose weight too quickly.  Have an organ transplant.  Have lead poisoning.  Take certain medicines, including aspirin, cyclosporine, diuretics, levodopa, and niacin.  Have kidney disease or psoriasis.  What are the signs or symptoms? An attack of acute gout happens quickly. It usually occurs in just one joint. The most common place is the big toe. Attacks often start at night. Other joints  that may be affected include joints of the feet, ankle, knee, fingers, wrist, or elbow. Symptoms may include:  Severe pain.  Warmth.  Swelling.  Stiffness.  Tenderness. The affected joint may be very painful to touch.  Shiny, red, or purple skin.  Chills and fever.  Chronic gout may cause symptoms more frequently. More joints may be involved. You may also have white or yellow lumps (tophi) on your hands or feet or in other areas near your joints. How is this diagnosed? This condition is diagnosed based on your symptoms, medical history, and physical exam. You may have tests, such as:  Blood tests to measure uric acid levels.  Removal of joint fluid with a needle (aspiration) to look for uric acid crystals.  X-rays to look for joint damage.  How is this treated? Treatment for this condition has two phases: treating an acute attack and preventing future attacks. Acute gout treatment may include medicines to reduce pain and swelling, including:  NSAIDs.  Steroids. These are strong anti-inflammatory medicines that can be taken by mouth (orally) or injected into a joint.  Colchicine. This medicine relieves pain and swelling when it is taken soon after an attack. It can be given orally or through an IV tube.  Preventive treatment may include:  Daily use of smaller doses of NSAIDs or colchicine.  Use of a medicine that reduces uric acid levels in your blood.  Changes to your diet. You may need to see a specialist about healthy eating (dietitian).    Follow these instructions at home: During a Gout Attack  If directed, apply ice to the affected area: ? Put ice in a plastic bag. ? Place a towel between your skin and the bag. ? Leave the ice on for 20 minutes, 2-3 times a day.  Rest the joint as much as possible. If the affected joint is in your leg, you may be given crutches to use.  Raise (elevate) the affected joint above the level of your heart as often as  possible.  Drink enough fluids to keep your urine clear or pale yellow.  Take over-the-counter and prescription medicines only as told by your health care provider.  Do not drive or operate heavy machinery while taking prescription pain medicine.  Follow instructions from your health care provider about eating or drinking restrictions.  Return to your normal activities as told by your health care provider. Ask your health care provider what activities are safe for you. Avoiding Future Gout Attacks  Follow a low-purine diet as told by your dietitian or health care provider. Avoid foods and drinks that are high in purines, including liver, kidney, anchovies, asparagus, herring, mushrooms, mussels, and beer.  Limit alcohol intake to no more than 1 drink a day for nonpregnant women and 2 drinks a day for men. One drink equals 12 oz of beer, 5 oz of wine, or 1 oz of hard liquor.  Maintain a healthy weight or lose weight if you are overweight. If you want to lose weight, talk with your health care provider. It is important that you do not lose weight too quickly.  Start or maintain an exercise program as told by your health care provider.  Drink enough fluids to keep your urine clear or pale yellow.  Take over-the-counter and prescription medicines only as told by your health care provider.  Keep all follow-up visits as told by your health care provider. This is important. Contact a health care provider if:  You have another gout attack.  You continue to have symptoms of a gout attack after10 days of treatment.  You have side effects from your medicines.  You have chills or a fever.  You have burning pain when you urinate.  You have pain in your lower back or belly. Get help right away if:  You have severe or uncontrolled pain.  You cannot urinate. This information is not intended to replace advice given to you by your health care provider. Make sure you discuss any questions  you have with your health care provider. Document Released: 01/06/2000 Document Revised: 06/16/2015 Document Reviewed: 10/21/2014 Elsevier Interactive Patient Education  2018 Elsevier Inc. Low-Purine Diet Purines are compounds that affect the level of uric acid in your body. A low-purine diet is a diet that is low in purines. Eating a low-purine diet can prevent the level of uric acid in your body from getting too high and causing gout or kidney stones or both. What do I need to know about this diet?  Choose low-purine foods. Examples of low-purine foods are listed in the next section.  Drink plenty of fluids, especially water. Fluids can help remove uric acid from your body. Try to drink 8-16 cups (1.9-3.8 L) a day.  Limit foods high in fat, especially saturated fat, as fat makes it harder for the body to get rid of uric acid. Foods high in saturated fat include pizza, cheese, ice cream, whole milk, fried foods, and gravies. Choose foods that are lower in fat and lean sources   of protein. Use olive oil when cooking as it contains healthy fats that are not high in saturated fat.  Limit alcohol. Alcohol interferes with the elimination of uric acid from your body. If you are having a gout attack, avoid all alcohol.  Keep in mind that different people's bodies react differently to different foods. You will probably learn over time which foods do or do not affect you. If you discover that a food tends to cause your gout to flare up, avoid eating that food. You can more freely enjoy foods that do not cause problems. If you have any questions about a food item, talk to your dietitian or health care provider. Which foods are low, moderate, and high in purines? The following is a list of foods that are low, moderate, and high in purines. You can eat any amount of the foods that are low in purines. You may be able to have small amounts of foods that are moderate in purines. Ask your health care provider how  much of a food moderate in purines you can have. Avoid foods high in purines. Grains  Foods low in purines: Enriched white bread, pasta, rice, cake, cornbread, popcorn.  Foods moderate in purines: Whole-grain breads and cereals, wheat germ, bran, oatmeal. Uncooked oatmeal. Dry wheat bran or wheat germ.  Foods high in purines: Pancakes, French toast, biscuits, muffins. Vegetables  Foods low in purines: All vegetables, except those that are moderate in purines.  Foods moderate in purines: Asparagus, cauliflower, spinach, mushrooms, green peas. Fruits  All fruits are low in purines. Meats and other Protein Foods  Foods low in purines: Eggs, nuts, peanut butter.  Foods moderate in purines: 80-90% lean beef, lamb, veal, pork, poultry, fish, eggs, peanut butter, nuts. Crab, lobster, oysters, and shrimp. Cooked dried beans, peas, and lentils.  Foods high in purines: Anchovies, sardines, herring, mussels, tuna, codfish, scallops, trout, and haddock. Bacon. Organ meats (such as liver or kidney). Tripe. Game meat. Goose. Sweetbreads. Dairy  All dairy foods are low in purines. Low-fat and fat-free dairy products are best because they are low in saturated fat. Beverages  Drinks low in purines: Water, carbonated beverages, tea, coffee, cocoa.  Drinks moderate in purines: Soft drinks and other drinks sweetened with high-fructose corn syrup. Juices. To find whether a food or drink is sweetened with high-fructose corn syrup, look at the ingredients list.  Drinks high in purines: Alcoholic beverages (such as beer). Condiments  Foods low in purines: Salt, herbs, olives, pickles, relishes, vinegar.  Foods moderate in purines: Butter, margarine, oils, mayonnaise. Fats and Oils  Foods low in purines: All types, except gravies and sauces made with meat.  Foods high in purines: Gravies and sauces made with meat. Other Foods  Foods low in purines: Sugars, sweets, gelatin. Cake. Soups made  without meat.  Foods moderate in purines: Meat-based or fish-based soups, broths, or bouillons. Foods and drinks sweetened with high-fructose corn syrup.  Foods high in purines: High-fat desserts (such as ice cream, cookies, cakes, pies, doughnuts, and chocolate). Contact your dietitian for more information on foods that are not listed here. This information is not intended to replace advice given to you by your health care provider. Make sure you discuss any questions you have with your health care provider. Document Released: 05/05/2010 Document Revised: 06/16/2015 Document Reviewed: 12/15/2012 Elsevier Interactive Patient Education  2017 Elsevier Inc.  

## 2017-03-04 NOTE — Telephone Encounter (Signed)
Copied from CRM [phone]. Topic: Inquiry >> Mar 04, 2017 11:12 AM Yvonna Alanisobinson, Andra M wrote: Reason for CRM: Patient's daughter Emelia SalisburyDee Dee wants to know if Dr. Selena BattenKim would call in a RX for patient's Gout. [facility] due to the patient's gout attack, she cannot place on any shoes or pants right now. Patient is also very unstable on her feet. [facility] would be very difficult to bring her in for an appt today. Please call Emelia SalisburyDee Dee at [phone] ASAP. An appt was scheduled earlier today with Dr. Salomon FickBanks.       Thank You!!!

## 2017-03-04 NOTE — Progress Notes (Signed)
Subjective:    Patient ID: [name], female    DOB: July 13, 1928, 84 y.o.   MRN: [id number]  Chief Complaint  Patient presents with  . Gout  Patient is accompanied by her daughter.  HPI Patient was seen today for acute concern.  Patient endorses gout flare started on Saturday.  Patient has not taken anything for this.  Patient states she is unable to put a shoe on her right foot secondary to right great toe pain.  Last gout flare was years ago.  Patient does endorse eating asparagus a few nights ago.  Patient takes prednisone chronically for polymyalgia rheumatica.  Of note patient's daughter expresses that she is upset she had to bring her mother out in the cold weather when she is unable to wear pants or put on a shoe secondary to the pain in her right foot.  Her daughter they tried contacting the clinic several times in hopes of getting a prescription sent in to the pharmacy but had not yet received a return call at lunch so they made an appointment.  Past Medical History:  Diagnosis Date  . Alcohol abuse   . Emphysema of lung (HCC) 01/26/2009   pt. unsure of this reports as of 07/31/2012  . Fracture of fifth metatarsal bone of right foot 08/29/2012  . Gout 08/11/2012  . Hyperlipemia 03/27/2007  . Hypertension   . Osteoporosis 07/12/2006  . Pernicious anemia 10/25/2006  . Polymyalgia rheumatica (HCC) 03/27/2007   sees Dr. Kellie Simmeringruslow in rheumatology  . Vitamin D deficiency 07/12/2006    Allergies  Allergen Reactions  . Aspirin Other (See Comments)    "threw up blood"  . Codeine Other (See Comments)    "threw up blood"  . Naproxen Other (See Comments)    Severe pain in joints and hands  . Oxycodone-Acetaminophen     REACTION: gastric reaction    ROS General: Denies fever, chills, night sweats, changes in weight, changes in appetite HEENT: Denies headaches, ear pain, changes in vision, rhinorrhea, sore throat CV: Denies CP, palpitations, SOB, orthopnea Pulm: Denies SOB, cough,  wheezing GI: Denies abdominal pain, nausea, vomiting, diarrhea, constipation GU: Denies dysuria, hematuria, frequency, vaginal discharge Msk: Denies muscle cramps, joint pains Neuro: Denies weakness, numbness, tingling Skin: Denies rashes, bruising  + erythema and pain in R great toe. Psych: Denies depression, anxiety, hallucinations     Objective:    Blood pressure 100/62.   Gen. Pleasant, well-nourished, in no distress, normal affect   HEENT: L hearing aid in place.  [place]/AT, face symmetric, no scleral icterus, PERRLA, nares patent without drainage Lungs: no accessory muscle use, CTAB, no wheezes or rales Cardiovascular: RRR, no m/r/g, no peripheral edema Abdomen: BS present, soft, NT/ND Neuro:  A&Ox3, CN II-XII intact, normal gait Skin:  Warm, no lesions/ rash.  R great toe with erythema and TTP.    Wt Readings from Last 3 Encounters:  02/13/17 105 lb 3.2 oz (47.7 kg)  02/11/17 105 lb 3.2 oz (47.7 kg)  01/18/17 109 lb 9.6 oz (49.7 kg)    Lab Results  Component Value Date   WBC 7.6 02/11/2017   HGB 14.0 02/11/2017   HCT 41.7 02/11/2017   PLT 239 02/11/2017   GLUCOSE 102 (H) 02/11/2017   CHOL 187 01/20/2016   TRIG 168.0 (H) 07/21/2015   HDL 92.20 01/20/2016   LDLDIRECT 115.5 09/08/2012   LDLCALC 89 07/21/2015   ALT 22 07/31/2012   AST 27 07/31/2012   NA 137 02/11/2017  K 3.7 02/11/2017   CL 99 (L) 02/11/2017   CREATININE 1.09 (H) 02/11/2017   BUN 13 02/11/2017   CO2 27 02/11/2017   TSH 1.08 03/27/2011   INR 0.98 02/11/2017   HGBA1C 5.8 07/21/2015    Assessment/Plan:  Acute gout involving toe of right foot, unspecified cause  - Plan: Colchicine 0.6 MG CAPS.  Explained how to take the medicine. -continue prednisone 2.5 mg qod for polymyalgia rheumatica -given handout of foods that may cause gout. -f/u prn   Abbe Amsterdam, MD

## 2017-03-18 ENCOUNTER — Other Ambulatory Visit: Payer: Self-pay | Admitting: Family Medicine

## 2017-04-23 ENCOUNTER — Telehealth: Payer: Self-pay | Admitting: Family Medicine

## 2017-04-23 MED ORDER — PREDNISONE 2.5 MG PO TABS: 2.5000 mg | ORAL_TABLET | Freq: Every day | ORAL | 1 refills | Status: DC

## 2017-04-23 MED ORDER — HYDROCHLOROTHIAZIDE 12.5 MG PO TABS: 12.5000 mg | ORAL_TABLET | Freq: Every day | ORAL | 1 refills | Status: DC

## 2017-04-23 NOTE — Telephone Encounter (Signed)
Copied from CRM [id number]. Topic: Quick Communication - See Telephone Encounter >> Apr 23, 2017  9:49 AM Windy KalataMichael, Melena Hayes L, NT wrote: CRM for notification. See Telephone encounter for: 04/23/17.  Patient is calling and states she needs a refill on predniSONE (DELTASONE) 5 MG tablet but since she cuts them in half she would like to get the 2.5 tablet so she does not have to cut them in half. Patient is also needing a refill on hydrochlorothiazide (HYDRODIURIL) 12.5 MG tablet. Please advise  [facility] [address], [place] - [address] [place] [address] Phone: [phone] Fax: [phone]

## 2017-04-23 NOTE — Telephone Encounter (Signed)
I called the pt and informed her of the message below.  Appts scheduled with Dr Selena BattenKim and Darl PikesSusan and she is aware the Rxs were sent to her pharmacy.

## 2017-04-23 NOTE — Telephone Encounter (Signed)
I have not seen her in some time.  Okay to send requested refills at 30 days with 1 refill.  But help her to make an follow-up/annual wellness exam w/ susan appointment in the next 2 months.

## 2017-05-27 NOTE — Progress Notes (Signed)
HPI:  Using dictation device. Unfortunately this device frequently misinterprets words/phrases.  [name] is a pleasant 84 y.o. here for follow up. Chronic medical problems summarized below were reviewed for changes.  She had a recurrent episode of hip pain that radiated down her leg in December and ended up seeing Dr. Yetta Barre for surgery.  She reports she recovered well. Reports her rheumatologist, now retired, Dr. Kellie Simmering had recommended she continue prednisone for life.  However, she tried to wean off of this over the spring.  After 3 days she felt terrible with pain all over.  She wishes to continue the prednisone at 2.5 mg daily.  She reports anytime she has tried to stop this or decrease further she feels awful and cannot function in her life.  She requests refills of her medications. Denies CP, SOB, DOE, treatment intolerance or new symptoms.  AWV 08/30/2016  HTN: -meds: hctz  PMR/hx chronic pain, DDD: -sees rheumatologist and neurosurgeon  Hx osteoporosis/vit D deficiency -meds: fosamax, vit D3  Hx b12 defixiency -b12 supplement  ROS: See pertinent positives and negatives per HPI.  Past Medical History:  Diagnosis Date  . Alcohol abuse   . Emphysema of lung (HCC) 01/26/2009   pt. unsure of this reports as of 07/31/2012  . Fracture of fifth metatarsal bone of right foot 08/29/2012  . Gout 08/11/2012  . Hyperlipemia 03/27/2007  . Hypertension   . Osteoporosis 07/12/2006  . Pernicious anemia 10/25/2006  . Polymyalgia rheumatica (HCC) 03/27/2007   sees Dr. Kellie Simmering in rheumatology  . Vitamin D deficiency 07/12/2006    Past Surgical History:  Procedure Laterality Date  . APPENDECTOMY  07/12/2006  . LUMBAR LAMINECTOMY/DECOMPRESSION MICRODISCECTOMY Right 02/13/2017   Procedure: Microdiscectomy - right - Lumbar three-Lumbar four;  Surgeon: Tia Alert, MD;  [facility];  Service: Neurosurgery;  Laterality: Right;  . PARTIAL HIP ARTHROPLASTY     left side- 2x's ( 2nd  surgery to correct 1st replacement), R side -   . TOOTH EXTRACTION N/A 08/04/2012   Procedure: EXTRACTIONS 17, 20;  Surgeon: Georgia Lopes, DDS;  Location: [facility] OR;  Service: Oral Surgery;  Laterality: N/A;    Family History  Problem Relation Age of Onset  . Mental illness Mother        alzheimer dementia  . Ulcers Father   . Heart disease Father   . Cancer Father        lung, smoker    SOCIAL HX: See HPI   Current Outpatient Medications:  .  alendronate (FOSAMAX) 70 MG tablet, TAKE 1 TABLET BY MOUTH ONCE A WEEK (EVERY  7  DAYS  ), Disp: 90 tablet, Rfl: 0 .  Cholecalciferol (VITAMIN D) 2000 units CAPS, Take 4,000 Units by mouth daily., Disp: , Rfl:  .  Cyanocobalamin (VITAMIN B-12) 1000 MCG SUBL, Place 1,000 mcg under the tongue daily. , Disp: , Rfl:  .  diphenhydrAMINE (BENADRYL) 25 MG tablet, Take 25 mg by mouth at bedtime as needed for sleep., Disp: , Rfl:  .  hydrochlorothiazide (HYDRODIURIL) 12.5 MG tablet, Take 1 tablet (12.5 mg total) by mouth daily., Disp: 90 tablet, Rfl: 1 .  Oyster Shell (OYSTER CALCIUM) 500 MG TABS tablet, Take 1,000 mg of elemental calcium by mouth daily., Disp: , Rfl:  .  predniSONE (DELTASONE) 2.5 MG tablet, Take 1 tablet (2.5 mg total) by mouth daily with breakfast., Disp: 90 tablet, Rfl: 1 .  vitamin C (ASCORBIC ACID) 250 MG tablet, Take 500 mg by mouth  daily., Disp: , Rfl:   EXAM:  Vitals:   05/28/17 0915  BP: 120/70  Pulse: 67  Temp: (!) 97.3 F (36.3 C)    Body mass index is 19.78 kg/m.  GENERAL: vitals reviewed and listed above, alert, oriented, appears well hydrated and in no acute distress  HEENT: atraumatic, conjunttiva clear, no obvious abnormalities on inspection of external nose and ears  NECK: no obvious masses on inspection  LUNGS: clear to auscultation bilaterally, no wheezes, rales or rhonchi, good air movement  CV: HRRR, no peripheral edema  MS: moves all extremities without noticeable abnormality Walks with a  cane  PSYCH: pleasant and cooperative, no obvious depression or anxiety  ASSESSMENT AND PLAN:  Discussed the following assessment and plan:  Essential hypertension - Plan: Basic metabolic panel, CBC  POLYMYALGIA RHEUMATICA  Vitamin D deficiency - Plan: VITAMIN D 25 Hydroxy (Vit-D Deficiency, Fractures)  B12 deficiency - Plan: Vitamin B12  -Discussed risks of the continued prednisone, she feels like she cannot function without it, has opted to continue -Labs per orders -Refills provided -Due for physical/annual wellness visit in August, advised to schedule -Follow-up sooner as needed  Patient Instructions  BEFORE YOU LEAVE: -labs -follow up: August (after 08/30/17) or September for AWV with susan/f/u Dr. Selena Batten same day on a Tuesday  We have ordered labs or studies at this visit. It can take up to 1-2 weeks for results and processing. IF results require follow up or explanation, we will call you with instructions. Clinically stable results will be released to your [facility]. If you have not heard from [place] or cannot find your results in [facility] in 2 weeks please contact our office at [phone].  If you are not yet signed up for [facility], please consider signing up.           Terressa Koyanagi, DO

## 2017-05-28 ENCOUNTER — Ambulatory Visit: Payer: Medicare Other

## 2017-05-28 ENCOUNTER — Ambulatory Visit (INDEPENDENT_AMBULATORY_CARE_PROVIDER_SITE_OTHER): Payer: Medicare Other | Admitting: Family Medicine

## 2017-05-28 ENCOUNTER — Telehealth: Payer: Self-pay

## 2017-05-28 ENCOUNTER — Encounter: Payer: Self-pay | Admitting: Family Medicine

## 2017-05-28 VITALS — BP 120/70 | HR 67 | Temp 97.3°F | Ht 61.0 in | Wt 104.7 lb

## 2017-05-28 DIAGNOSIS — E673 Hypervitaminosis D: Secondary | ICD-10-CM

## 2017-05-28 DIAGNOSIS — E559 Vitamin D deficiency, unspecified: Secondary | ICD-10-CM

## 2017-05-28 DIAGNOSIS — I10 Essential (primary) hypertension: Secondary | ICD-10-CM | POA: Diagnosis not present

## 2017-05-28 DIAGNOSIS — M353 Polymyalgia rheumatica: Secondary | ICD-10-CM | POA: Diagnosis not present

## 2017-05-28 DIAGNOSIS — E538 Deficiency of other specified B group vitamins: Secondary | ICD-10-CM

## 2017-05-28 LAB — BASIC METABOLIC PANEL
BUN: 14 mg/dL (ref 6–23)
CHLORIDE: 102 meq/L (ref 96–112)
CO2: 31 mEq/L (ref 19–32)
Calcium: 9.3 mg/dL (ref 8.4–10.5)
Creatinine, Ser: 0.94 mg/dL (ref 0.40–1.20)
GFR: 59.85 mL/min — AB (ref >60.00)
GLUCOSE: 92 mg/dL (ref 70–99)
POTASSIUM: 3.9 meq/L (ref 3.5–5.1)
Sodium: 141 mEq/L (ref 135–145)

## 2017-05-28 LAB — CBC
HEMATOCRIT: 41.5 % (ref 36.0–46.0)
HEMOGLOBIN: 14 g/dL (ref 12.0–15.0)
MCHC: 33.8 g/dL (ref 30.0–36.0)
MCV: 97.6 fl (ref 78.0–100.0)
Platelets: 232 10*3/uL (ref 150.0–400.0)
RBC: 4.25 Mil/uL (ref 3.87–5.11)
RDW: 14.5 % (ref 11.5–15.5)
WBC: 7 10*3/uL (ref 4.0–10.5)

## 2017-05-28 LAB — VITAMIN B12: Vitamin B-12: 1054 pg/mL — ABNORMAL HIGH (ref 211–911)

## 2017-05-28 LAB — VITAMIN D 25 HYDROXY (VIT D DEFICIENCY, FRACTURES): VITD: 106.36 ng/mL (ref 30.00–100.00)

## 2017-05-28 MED ORDER — PREDNISONE 2.5 MG PO TABS: 2.5000 mg | ORAL_TABLET | Freq: Every day | ORAL | 1 refills | Status: DC

## 2017-05-28 MED ORDER — HYDROCHLOROTHIAZIDE 12.5 MG PO TABS: 12.5000 mg | ORAL_TABLET | Freq: Every day | ORAL | 1 refills | Status: DC

## 2017-05-28 MED ORDER — ALENDRONATE SODIUM 70 MG PO TABS: ORAL_TABLET | ORAL | 0 refills | Status: DC

## 2017-05-28 NOTE — Telephone Encounter (Signed)
Please let her know Vit D level very high. Is she taking vitamin D or multivitamin? If so, advise to stop. Recheck in 2-4 weeks.

## 2017-05-28 NOTE — Telephone Encounter (Signed)
[facility] lab called with   CRITICAL RESULT   Vit D = 106.36   Dr. Selena Batten - FYI. Thanks!

## 2017-05-28 NOTE — Telephone Encounter (Signed)
I called the pt and informed her of the results.  She agreed to discontinue the vitamin D 4000 units she has been taking and a lab appt was scheduled for 5/28.

## 2017-05-28 NOTE — Patient Instructions (Addendum)
BEFORE YOU LEAVE: -labs -follow up: August (after 08/30/17) or September for AWV with susan/f/u Dr. Selena Batten same day on a Tuesday  We have ordered labs or studies at this visit. It can take up to 1-2 weeks for results and processing. IF results require follow up or explanation, we will call you with instructions. Clinically stable results will be released to your [facility]. If you have not heard from [place] or cannot find your results in [facility] in 2 weeks please contact our office at [phone].  If you are not yet signed up for [facility], please consider signing up.

## 2017-06-18 ENCOUNTER — Other Ambulatory Visit (INDEPENDENT_AMBULATORY_CARE_PROVIDER_SITE_OTHER): Payer: Medicare Other

## 2017-06-18 ENCOUNTER — Telehealth: Payer: Self-pay | Admitting: Family Medicine

## 2017-06-18 DIAGNOSIS — E673 Hypervitaminosis D: Secondary | ICD-10-CM | POA: Diagnosis not present

## 2017-06-18 LAB — VITAMIN D 25 HYDROXY (VIT D DEFICIENCY, FRACTURES): VITD: 90.57 ng/mL (ref 30.00–100.00)

## 2017-06-18 NOTE — Telephone Encounter (Signed)
Pt in the office needing a refill on alendronate 70 MG   [facility] [address]

## 2017-06-19 MED ORDER — ALENDRONATE SODIUM 70 MG PO TABS: ORAL_TABLET | ORAL | 1 refills | Status: DC

## 2017-06-19 NOTE — Telephone Encounter (Signed)
Rx done. 

## 2017-09-19 ENCOUNTER — Other Ambulatory Visit (INDEPENDENT_AMBULATORY_CARE_PROVIDER_SITE_OTHER): Payer: Medicare Other

## 2017-09-19 ENCOUNTER — Other Ambulatory Visit: Payer: Self-pay | Admitting: *Deleted

## 2017-09-19 DIAGNOSIS — E673 Hypervitaminosis D: Secondary | ICD-10-CM

## 2017-09-19 LAB — VITAMIN D 25 HYDROXY (VIT D DEFICIENCY, FRACTURES): VITD: 74.21 ng/mL (ref 30.00–100.00)

## 2017-09-19 MED ORDER — HYDROCHLOROTHIAZIDE 12.5 MG PO TABS: 12.5000 mg | ORAL_TABLET | Freq: Every day | ORAL | 1 refills | Status: DC

## 2017-09-19 MED ORDER — PREDNISONE 2.5 MG PO TABS: 2.5000 mg | ORAL_TABLET | Freq: Every day | ORAL | 1 refills | Status: DC

## 2017-09-19 NOTE — Telephone Encounter (Signed)
Rx done. 

## 2017-09-23 NOTE — Progress Notes (Deleted)
Subjective:   [name] is a 84 y.o. female who presents for Medicare Annual (Subsequent) preventive examination.  Reports health as Dr. Selena Batten OV 8:45  Diet Glucose is 92;  A1c 5.8   Exercise  hdl 92  Health Maintenance Due  Topic Date Due  . INFLUENZA VACCINE  08/22/2017   Dexa  08/03/2014        Objective:     Vitals: There were no vitals taken for this visit.  There is no height or weight on file to calculate BMI.  Advanced Directives 02/14/2017 02/13/2017 02/11/2017 08/30/2016 08/04/2012 07/31/2012  Does Patient Have a Medical Advance Directive? Yes Yes Yes Yes - Patient has advance directive, copy not in chart  Type of Advance Directive Healthcare Power of [place];Living will Healthcare Power of [place];Living will Healthcare Power of [place];Living will - - Living will  Does patient want to make changes to medical advance directive? No - Patient declined No - Patient declined No - Patient declined - - -  Copy of Healthcare Power of Attorney in Chart? No - copy requested No - copy requested No - copy requested - - -  Pre-existing out of facility DNR order (yellow form or pink MOST form) - - - - No -    Tobacco Social History   Tobacco Use  Smoking Status Former Smoker  . Packs/day: 2.00  . Years: 58.00  . Pack years: 116.00  . Start date: 01/23/1946  . Last attempt to quit: 07/12/2006  . Years since quitting: 11.2  Smokeless Tobacco Never Used     Counseling given: Not Answered   Clinical Intake:     Past Medical History:  Diagnosis Date  . Alcohol abuse   . Emphysema of lung (HCC) 01/26/2009   pt. unsure of this reports as of 07/31/2012  . Fracture of fifth metatarsal bone of right foot 08/29/2012  . Gout 08/11/2012  . Hyperlipemia 03/27/2007  . Hypertension   . Osteoporosis 07/12/2006  . Pernicious anemia 10/25/2006  . Polymyalgia rheumatica (HCC) 03/27/2007   sees Dr. Kellie Simmering in rheumatology  . Vitamin D deficiency 07/12/2006   Past Surgical  History:  Procedure Laterality Date  . APPENDECTOMY  07/12/2006  . LUMBAR LAMINECTOMY/DECOMPRESSION MICRODISCECTOMY Right 02/13/2017   Procedure: Microdiscectomy - right - Lumbar three-Lumbar four;  Surgeon: Tia Alert, MD;  Location: [facility] OR;  Service: Neurosurgery;  Laterality: Right;  . PARTIAL HIP ARTHROPLASTY     left side- 2x's ( 2nd surgery to correct 1st replacement), R side -   . TOOTH EXTRACTION N/A 08/04/2012   Procedure: EXTRACTIONS 17, 20;  Surgeon: Georgia Lopes, DDS;  Location: [facility] OR;  Service: Oral Surgery;  Laterality: N/A;   Family History  Problem Relation Age of Onset  . Mental illness Mother        alzheimer dementia  . Ulcers Father   . Heart disease Father   . Cancer Father        lung, smoker   Social History   Socioeconomic History  . Marital status: Married    Spouse name: Not on file  . Number of children: Not on file  . Years of education: Not on file  . Highest education level: Not on file  Occupational History  . Not on file  Social Needs  . Financial resource strain: Not on file  . Food insecurity:    Worry: Not on file    Inability: Not on file  . Transportation needs:  Medical: Not on file    Non-medical: Not on file  Tobacco Use  . Smoking status: Former Smoker    Packs/day: 2.00    Years: 58.00    Pack years: 116.00    Start date: 01/23/1946    Last attempt to quit: 07/12/2006    Years since quitting: 11.2  . Smokeless tobacco: Never Used  Substance and Sexual Activity  . Alcohol use: No    Alcohol/week: 10.0 standard drinks    Types: 10 Shots of liquor per week    Frequency: Never    Comment: quit completely - 2012  . Drug use: No  . Sexual activity: Never  Lifestyle  . Physical activity:    Days per week: Not on file    Minutes per session: Not on file  . Stress: Not on file  Relationships  . Social connections:    Talks on phone: Not on file    Gets together: Not on file    Attends religious service: Not on file     Active member of club or organization: Not on file    Attends meetings of clubs or organizations: Not on file    Relationship status: Not on file  Other Topics Concern  . Not on file  Social History Narrative   Work or School: none      Home Situation: lives with husband - drives, husband takes care of finances, dresses herself, takes care of housework and cooking      Spiritual Beliefs: [employer]      Lifestyle: uses cane when goes out, exercises sometimes - diet healthy             Outpatient Encounter Medications as of 09/24/2017  Medication Sig  . alendronate (FOSAMAX) 70 MG tablet TAKE 1 TABLET BY MOUTH ONCE A WEEK (EVERY  7  DAYS  )  . Cholecalciferol (VITAMIN D) 2000 units CAPS Take 4,000 Units by mouth daily.  . Cyanocobalamin (VITAMIN B-12) 1000 MCG SUBL Place 1,000 mcg under the tongue daily.   . diphenhydrAMINE (BENADRYL) 25 MG tablet Take 25 mg by mouth at bedtime as needed for sleep.  . hydrochlorothiazide (HYDRODIURIL) 12.5 MG tablet Take 1 tablet (12.5 mg total) by mouth daily.  Ethelda Chick (OYSTER CALCIUM) 500 MG TABS tablet Take 1,000 mg of elemental calcium by mouth daily.  . predniSONE (DELTASONE) 2.5 MG tablet Take 1 tablet (2.5 mg total) by mouth daily with breakfast.  . vitamin C (ASCORBIC ACID) 250 MG tablet Take 500 mg by mouth daily.   No facility-administered encounter medications on file as of 09/24/2017.     Activities of Daily Living In your present state of health, do you have any difficulty performing the following activities: 02/14/2017 02/11/2017  Hearing? Malvin Johns  Vision? Y Y  Difficulty concentrating or making decisions? N N  Walking or climbing stairs? N N  Dressing or bathing? Y N  Doing errands, shopping? N N  Some recent data might be hidden    Patient Care Team: Terressa Koyanagi, DO as PCP - General (Family Medicine)    Assessment:   This is a routine wellness examination for [place].  Exercise Activities and Dietary  recommendations    Goals   None     Fall Risk Fall Risk  08/30/2016 08/13/2016 03/19/2014 09/08/2012  Falls in the past year? No No No No  Comment - Emmi Telephone Survey: data to providers prior to load - -     Depression  Screen PHQ 2/9 Scores 08/30/2016 03/19/2014 09/08/2012  PHQ - 2 Score 0 0 0     Cognitive Function MMSE - Mini Mental State Exam 08/30/2016  Orientation to time 5  Orientation to Place 5  Registration 3  Attention/ Calculation 5  Recall 2  Language- name 2 objects 2  Language- repeat 1  Language- follow 3 step command 3  Language- read & follow direction 1  Write a sentence 1  Copy design 0  Total score 28        Immunization History  Administered Date(s) Administered  . Influenza Split 10/24/2010, 10/04/2011  . Influenza Whole 12/18/2006, 11/12/2007, 10/19/2008, 10/25/2009  . Influenza, High Dose Seasonal PF 10/12/2014, 10/10/2015, 10/25/2016  . Influenza,inj,Quad PF,6+ Mos 10/02/2012, 10/21/2013  . Pneumococcal Conjugate-13 07/20/2014  . Pneumococcal Polysaccharide-23 11/23/1994, 09/08/2012  . Td 06/22/1997, 10/15/2007    Qualifies for Shingles Vaccine?***  Screening Tests Health Maintenance  Topic Date Due  . INFLUENZA VACCINE  08/22/2017  . TETANUS/TDAP  10/14/2017  . PNA vac Low Risk Adult  Completed  . DEXA SCAN  Addressed         Plan:      PCP Notes ***  Health Maintenance ***  Abnormal Screens  ***  Referrals  ***  Patient concerns; ***  Nurse Concerns; ***  Next PCP apt ***      I have personally reviewed and noted the following in the patient's chart:   . Medical and social history . Use of alcohol, tobacco or illicit drugs  . Current medications and supplements . Functional ability and status . Nutritional status . Physical activity . Advanced directives . List of other physicians . Hospitalizations, surgeries, and ER visits in previous 12 months . Vitals . Screenings to include cognitive,  depression, and falls . Referrals and appointments  In addition, I have reviewed and discussed with patient certain preventive protocols, quality metrics, and best practice recommendations. A written personalized care plan for preventive services as well as general preventive health recommendations were provided to patient.     Montine Circle, RN  09/23/2017

## 2017-09-23 NOTE — Progress Notes (Deleted)
HPI:  Using dictation device. Unfortunately this device frequently misinterprets words/phrases.  [name] is a pleasant 84 y.o. here for follow up. Chronic medical problems summarized below were reviewed for changes and stability and were updated as needed below. These issues and their treatment remain stable for the most part. ***. Denies CP, SOB, DOE, treatment intolerance or new symptoms. Due for flu vaccine,   AWV 08/30/2016, bmp, cbc, b12  HTN: -meds: hctz  PMR/hx chronic pain, DDD: -sees rheumatologist and neurosurgeon (Dr. Yetta Barre); s/p surgery low back with Dr. Maudry Mayhew in 12/18 -prior rheumatologist Dr. Kellie Simmering, advised lifetime of prednisone per her report as feels terrible every time she tries to wean off - on low dose  Hx osteoporosis/vit D deficiency -meds: fosamax, vit D3 -elevated vit D on D supplement, so supplement held  Hx b12 defixiency -b12 supplement  ROS: See pertinent positives and negatives per HPI.  Past Medical History:  Diagnosis Date  . Alcohol abuse   . Emphysema of lung (HCC) 01/26/2009   pt. unsure of this reports as of 07/31/2012  . Fracture of fifth metatarsal bone of right foot 08/29/2012  . Gout 08/11/2012  . Hyperlipemia 03/27/2007  . Hypertension   . Osteoporosis 07/12/2006  . Pernicious anemia 10/25/2006  . Polymyalgia rheumatica (HCC) 03/27/2007   sees Dr. Kellie Simmering in rheumatology  . Vitamin D deficiency 07/12/2006    Past Surgical History:  Procedure Laterality Date  . APPENDECTOMY  07/12/2006  . LUMBAR LAMINECTOMY/DECOMPRESSION MICRODISCECTOMY Right 02/13/2017   Procedure: Microdiscectomy - right - Lumbar three-Lumbar four;  Surgeon: Tia Alert, MD;  [facility] OR;  Service: Neurosurgery;  Laterality: Right;  . PARTIAL HIP ARTHROPLASTY     left side- 2x's ( 2nd surgery to correct 1st replacement), R side -   . TOOTH EXTRACTION N/A 08/04/2012   Procedure: EXTRACTIONS 17, 20;  Surgeon: Georgia Lopes, DDS;  Location: [facility] OR;   Service: Oral Surgery;  Laterality: N/A;    Family History  Problem Relation Age of Onset  . Mental illness Mother        alzheimer dementia  . Ulcers Father   . Heart disease Father   . Cancer Father        lung, smoker    SOCIAL HX: ***   Current Outpatient Medications:  .  alendronate (FOSAMAX) 70 MG tablet, TAKE 1 TABLET BY MOUTH ONCE A WEEK (EVERY  7  DAYS  ), Disp: 12 tablet, Rfl: 1 .  Cholecalciferol (VITAMIN D) 2000 units CAPS, Take 4,000 Units by mouth daily., Disp: , Rfl:  .  Cyanocobalamin (VITAMIN B-12) 1000 MCG SUBL, Place 1,000 mcg under the tongue daily. , Disp: , Rfl:  .  diphenhydrAMINE (BENADRYL) 25 MG tablet, Take 25 mg by mouth at bedtime as needed for sleep., Disp: , Rfl:  .  hydrochlorothiazide (HYDRODIURIL) 12.5 MG tablet, Take 1 tablet (12.5 mg total) by mouth daily., Disp: 90 tablet, Rfl: 1 .  Oyster Shell (OYSTER CALCIUM) 500 MG TABS tablet, Take 1,000 mg of elemental calcium by mouth daily., Disp: , Rfl:  .  predniSONE (DELTASONE) 2.5 MG tablet, Take 1 tablet (2.5 mg total) by mouth daily with breakfast., Disp: 90 tablet, Rfl: 1 .  vitamin C (ASCORBIC ACID) 250 MG tablet, Take 500 mg by mouth daily., Disp: , Rfl:   EXAM:  There were no vitals filed for this visit.  There is no height or weight on file to calculate BMI.  GENERAL: vitals reviewed and listed above,  alert, oriented, appears well hydrated and in no acute distress  HEENT: atraumatic, conjunttiva clear, no obvious abnormalities on inspection of external nose and ears  NECK: no obvious masses on inspection  LUNGS: clear to auscultation bilaterally, no wheezes, rales or rhonchi, good air movement  CV: HRRR, no peripheral edema  MS: moves all extremities without noticeable abnormality *** PSYCH: pleasant and cooperative, no obvious depression or anxiety  ASSESSMENT AND PLAN:  Discussed the following assessment and plan:  No diagnosis found.  *** -Patient advised to return or  notify a doctor immediately if symptoms worsen or persist or new concerns arise.  There are no Patient Instructions on file for this visit.  Terressa Koyanagi, DO

## 2017-09-24 ENCOUNTER — Ambulatory Visit: Payer: Medicare Other

## 2017-09-24 ENCOUNTER — Ambulatory Visit: Payer: Medicare Other | Admitting: Family Medicine

## 2017-09-24 DIAGNOSIS — Z0289 Encounter for other administrative examinations: Secondary | ICD-10-CM

## 2017-10-15 ENCOUNTER — Telehealth: Payer: Self-pay | Admitting: Family Medicine

## 2017-10-15 NOTE — Telephone Encounter (Signed)
Copied from CRM [id number]. Topic: Quick Communication - Rx Refill/Question >> Oct 15, 2017  4:01 PM Zada GirtLander, [place] wrote: Medication: needs something for grief/anxiety as [facility]  Has the patient contacted their pharmacy? No. (Agent: If no, request that the patient contact the pharmacy for the refill.) (Agent: If yes, when and what did the pharmacy advise?)  Preferred Pharmacy (with phone number or street name): [facility] [address]- [place], [place] - [id number] [place] [address] Phone: [phone] Fax: [phone]    Agent: Please be advised that RX refills may take up to 3 business days. We ask that you follow-up with your pharmacy.

## 2017-10-15 NOTE — Telephone Encounter (Signed)
I am so sorry to hear this and my heart aches for her and the family. What a sweet man he is.  The best options for grief treatment are with hospice and palliative care and with out behavioral heath specialist. Please give her the numbers to contact. There are no medications to treat grief unfortunately.  Many hugs, prayers, thoughts from us to them.

## 2017-10-15 NOTE — Telephone Encounter (Signed)
I called the pts daughter DeeDee and informed her of the message below.

## 2017-11-25 ENCOUNTER — Ambulatory Visit (INDEPENDENT_AMBULATORY_CARE_PROVIDER_SITE_OTHER): Payer: Medicare Other

## 2017-11-25 DIAGNOSIS — Z23 Encounter for immunization: Secondary | ICD-10-CM

## 2017-12-09 ENCOUNTER — Other Ambulatory Visit: Payer: Self-pay | Admitting: Family Medicine

## 2018-01-27 ENCOUNTER — Ambulatory Visit: Payer: Self-pay

## 2018-01-27 ENCOUNTER — Emergency Department (HOSPITAL_COMMUNITY): Payer: Medicare Other

## 2018-01-27 ENCOUNTER — Observation Stay (HOSPITAL_COMMUNITY)
Admission: EM | Admit: 2018-01-27 | Discharge: 2018-01-29 | Disposition: A | Discharge status: Home / Self Care | Payer: Medicare Other | Attending: Internal Medicine | Admitting: Internal Medicine

## 2018-01-27 ENCOUNTER — Other Ambulatory Visit: Payer: Self-pay

## 2018-01-27 ENCOUNTER — Encounter (HOSPITAL_COMMUNITY): Payer: Self-pay | Admitting: Emergency Medicine

## 2018-01-27 DIAGNOSIS — N179 Acute kidney failure, unspecified: Secondary | ICD-10-CM | POA: Diagnosis not present

## 2018-01-27 DIAGNOSIS — D510 Vitamin B12 deficiency anemia due to intrinsic factor deficiency: Secondary | ICD-10-CM | POA: Diagnosis present

## 2018-01-27 DIAGNOSIS — R000 Tachycardia, unspecified: Secondary | ICD-10-CM | POA: Diagnosis not present

## 2018-01-27 DIAGNOSIS — I4891 Unspecified atrial fibrillation: Secondary | ICD-10-CM | POA: Insufficient documentation

## 2018-01-27 DIAGNOSIS — I110 Hypertensive heart disease with heart failure: Secondary | ICD-10-CM | POA: Insufficient documentation

## 2018-01-27 DIAGNOSIS — M353 Polymyalgia rheumatica: Secondary | ICD-10-CM | POA: Diagnosis not present

## 2018-01-27 DIAGNOSIS — E785 Hyperlipidemia, unspecified: Secondary | ICD-10-CM | POA: Diagnosis not present

## 2018-01-27 DIAGNOSIS — J111 Influenza due to unidentified influenza virus with other respiratory manifestations: Secondary | ICD-10-CM

## 2018-01-27 DIAGNOSIS — F1010 Alcohol abuse, uncomplicated: Secondary | ICD-10-CM | POA: Diagnosis not present

## 2018-01-27 DIAGNOSIS — R69 Illness, unspecified: Secondary | ICD-10-CM

## 2018-01-27 DIAGNOSIS — R918 Other nonspecific abnormal finding of lung field: Secondary | ICD-10-CM | POA: Diagnosis not present

## 2018-01-27 DIAGNOSIS — J209 Acute bronchitis, unspecified: Secondary | ICD-10-CM | POA: Diagnosis not present

## 2018-01-27 DIAGNOSIS — R0602 Shortness of breath: Secondary | ICD-10-CM | POA: Diagnosis present

## 2018-01-27 DIAGNOSIS — I10 Essential (primary) hypertension: Secondary | ICD-10-CM | POA: Diagnosis present

## 2018-01-27 DIAGNOSIS — Z79899 Other long term (current) drug therapy: Secondary | ICD-10-CM | POA: Insufficient documentation

## 2018-01-27 DIAGNOSIS — R05 Cough: Secondary | ICD-10-CM | POA: Diagnosis present

## 2018-01-27 DIAGNOSIS — Z87891 Personal history of nicotine dependence: Secondary | ICD-10-CM | POA: Diagnosis not present

## 2018-01-27 LAB — I-STAT CG4 LACTIC ACID, ED: Lactic Acid, Venous: 1.43 mmol/L (ref 0.5–1.9)

## 2018-01-27 LAB — CBC WITH DIFFERENTIAL/PLATELET
Abs Immature Granulocytes: 0.04 10*3/uL (ref 0.00–0.07)
Basophils Absolute: 0 10*3/uL (ref 0.0–0.1)
Basophils Relative: 0 %
EOS PCT: 0 %
Eosinophils Absolute: 0 10*3/uL (ref 0.0–0.5)
HEMATOCRIT: 49.2 % — AB (ref 36.0–46.0)
Hemoglobin: 16.7 g/dL — ABNORMAL HIGH (ref 12.0–15.0)
Immature Granulocytes: 0 %
LYMPHS ABS: 0.8 10*3/uL (ref 0.7–4.0)
Lymphocytes Relative: 8 %
MCH: 33.3 pg (ref 26.0–34.0)
MCHC: 33.9 g/dL (ref 30.0–36.0)
MCV: 98.2 fL (ref 80.0–100.0)
MONOS PCT: 8 %
Monocytes Absolute: 0.7 10*3/uL (ref 0.1–1.0)
NRBC: 0 % (ref 0.0–0.2)
Neutro Abs: 8 10*3/uL — ABNORMAL HIGH (ref 1.7–7.7)
Neutrophils Relative %: 84 %
Platelets: 186 10*3/uL (ref 150–400)
RBC: 5.01 MIL/uL (ref 3.87–5.11)
RDW: 13.8 % (ref 11.5–15.5)
WBC: 9.5 10*3/uL (ref 4.0–10.5)

## 2018-01-27 LAB — BASIC METABOLIC PANEL
Anion gap: 12 (ref 5–15)
BUN: 18 mg/dL (ref 8–23)
CALCIUM: 8.6 mg/dL — AB (ref 8.9–10.3)
CO2: 24 mmol/L (ref 22–32)
CREATININE: 1.67 mg/dL — AB (ref 0.44–1.00)
Chloride: 98 mmol/L (ref 98–111)
GFR calc Af Amer: 32 mL/min — ABNORMAL LOW (ref >60)
GFR calc non Af Amer: 27 mL/min — ABNORMAL LOW (ref >60)
GLUCOSE: 113 mg/dL — AB (ref 70–99)
Potassium: 3.9 mmol/L (ref 3.5–5.1)
Sodium: 134 mmol/L — ABNORMAL LOW (ref 135–145)

## 2018-01-27 MED ORDER — SODIUM CHLORIDE 0.9 % IV BOLUS
500.0000 mL | Freq: Once | INTRAVENOUS | Status: AC
  Administered 2018-01-27: 500 mL via INTRAVENOUS

## 2018-01-27 MED ORDER — SODIUM CHLORIDE 0.9 % IV BOLUS
500.0000 mL | Freq: Once | INTRAVENOUS | Status: AC
  Administered 2018-01-28: 500 mL via INTRAVENOUS

## 2018-01-27 NOTE — Telephone Encounter (Signed)
Agree with recommendations. Recommend they go by EMS.

## 2018-01-27 NOTE — ED Triage Notes (Signed)
Per daughter, Pt had fever of 102.5 today. Pt reports cough, body aches, sore throat, headache since Thursday or Friday. Pt reports small amount of sputum. Pt reports she had flu shot this year.

## 2018-01-27 NOTE — Telephone Encounter (Signed)
Pt's daughter DeeDee called to report pt's symptoms: dry cough, muscle aches, sore throat, fever, headache, runny nose and very weak and fever to 101.9. Symptoms started at the end of last week. Daughter denies pt has SOB or wheezing. No known exposure to the flu. Per daughter pt got the flu shot. Per chart no flu shot date noted.  Called PCP office and spoke with Amalia Hailey and offered to get pt an appointment for tomorrow. Daughter stated that she was not sure if she could get mother in without much difficulty. Advised daughter to call 911 for transport. Called office back and spoke to [place]. Asked Phineas Semen to speak with daughter.   Reason for Disposition . Patient is HIGH RISK (e.g., age > 64 years, pregnant, HIV+, or chronic medical condition)  Answer Assessment - Initial Assessment Questions 1. WORST SYMPTOM: "What is your worst symptom?" (e.g., cough, runny nose, muscle aches, headache, sore throat, fever)      Dry cough, muscle aches, sore throat, fever, headache, runny nose, weak 2. ONSET: "When did your flu symptoms start?"      End of last week 3. COUGH: "How bad is the cough?"       Dry cough  4. RESPIRATORY DISTRESS: "Describe your breathing."      No SOB no wheezing congested chest 5. FEVER: "Do you have a fever?" If so, ask: "What is your temperature, how was it measured, and when did it start?" Yes to 101.9 6. EXPOSURE: "Were you exposed to someone with influenza?"       Not aware of exposure 7. FLU VACCINE: "Did you get a flu shot this year?"     yes 8. HIGH RISK DISEASE: "Do you any chronic medical problems?" (e.g., heart or lung disease, asthma, weak immune system, or other HIGH RISK conditions)     Polymyalgia rheumatica 9. PREGNANCY: "Is there any chance you are pregnant?" "When was your last menstrual period?"     n/a 10. OTHER SYMPTOMS: "Do you have any other symptoms?"  (e.g., runny nose, muscle aches, headache, sore throat)  all  Protocols used: INFLUENZA -  SEASONAL-A-AH

## 2018-01-27 NOTE — ED Provider Notes (Signed)
[facility] EMERGENCY DEPARTMENT Provider Note   CSN: [id number] Arrival date & time: 01/27/18  1703     History   Chief Complaint Chief Complaint  Patient presents with  . Influenza    HPI [name] is a 85 y.o. female.  Patient with history of emphysema, hypertension, hyperlipidemia presents the emergency department with flulike symptoms.  Symptoms started as mild upper respiratory symptoms 4 days ago.  This included mild cough and congestion.  Yesterday her symptoms became much worse, with fever, increased cough and shortness of breath.  She has had generalized weakness and per her daughter, has been having trouble getting up from a sitting position and walking.  Typically she is very active.  Fever to 102 F today.  Patient is on chronic prednisone for polymyalgia rheumatica.  The onset of this condition was acute. Aggravating factors: none. Alleviating factors: none.       Past Medical History:  Diagnosis Date  . Alcohol abuse   . Emphysema of lung (HCC) 01/26/2009   pt. unsure of this reports as of 07/31/2012  . Fracture of fifth metatarsal bone of right foot 08/29/2012  . Gout 08/11/2012  . Hyperlipemia 03/27/2007  . Hypertension   . Osteoporosis 07/12/2006  . Pernicious anemia 10/25/2006  . Polymyalgia rheumatica (HCC) 03/27/2007   sees Dr. Kellie Simmering in rheumatology  . Vitamin D deficiency 07/12/2006    Patient Active Problem List   Diagnosis Date Noted  . S/P lumbar laminectomy 02/13/2017  . Hearing loss of both ears 06/04/2012  . Vitamin D deficiency 06/30/2009  . POLYMYALGIA RHEUMATICA 03/27/2007  . PERNICIOUS ANEMIA 10/25/2006  . Essential hypertension 07/12/2006  . Osteoporosis 07/12/2006    Past Surgical History:  Procedure Laterality Date  . APPENDECTOMY  07/12/2006  . LUMBAR LAMINECTOMY/DECOMPRESSION MICRODISCECTOMY Right 02/13/2017   Procedure: Microdiscectomy - right - Lumbar three-Lumbar four;  Surgeon: Tia Alert, MD;   Location: [facility] OR;  Service: Neurosurgery;  Laterality: Right;  . PARTIAL HIP ARTHROPLASTY     left side- 2x's ( 2nd surgery to correct 1st replacement), R side -   . TOOTH EXTRACTION N/A 08/04/2012   Procedure: EXTRACTIONS 17, 20;  Surgeon: Georgia Lopes, DDS;  Location: [facility] OR;  Service: Oral Surgery;  Laterality: N/A;     OB History   No obstetric history on file.      Home Medications    Prior to Admission medications   Medication Sig Start Date End Date Taking? Authorizing Provider  alendronate (FOSAMAX) 70 MG tablet TAKE 1 TABLET BY MOUTH ONCE A WEEK 12/09/17   Terressa Koyanagi, DO  Cholecalciferol (VITAMIN D) 2000 units CAPS Take 4,000 Units by mouth daily.    [provider]  Cyanocobalamin (VITAMIN B-12) 1000 MCG SUBL Place 1,000 mcg under the tongue daily.     [provider]  diphenhydrAMINE (BENADRYL) 25 MG tablet Take 25 mg by mouth at bedtime as needed for sleep.    [provider]  hydrochlorothiazide (HYDRODIURIL) 12.5 MG tablet Take 1 tablet (12.5 mg total) by mouth daily. 09/19/17   Terressa Koyanagi, DO  Oyster Shell (OYSTER CALCIUM) 500 MG TABS tablet Take 1,000 mg of elemental calcium by mouth daily.    [provider]  predniSONE (DELTASONE) 2.5 MG tablet Take 1 tablet (2.5 mg total) by mouth daily with breakfast. 09/19/17   Terressa Koyanagi, DO  vitamin C (ASCORBIC ACID) 250 MG tablet Take 500 mg by mouth daily.  [provider]    Family History Family History  Problem Relation Age of Onset  . Mental illness Mother        alzheimer dementia  . Ulcers Father   . Heart disease Father   . Cancer Father        lung, smoker    Social History Social History   Tobacco Use  . Smoking status: Former Smoker    Packs/day: 2.00    Years: 58.00    Pack years: 116.00    Start date: 01/[birth year]/1948    Last attempt to quit: 07/12/2006    Years since quitting: 11.5  . Smokeless tobacco: Never Used  Substance Use Topics  . Alcohol use:  No    Alcohol/week: 10.0 standard drinks    Types: 10 Shots of liquor per week    Frequency: Never    Comment: quit completely - 2012  . Drug use: No     Allergies   Aspirin; Codeine; Naproxen; and Oxycodone-acetaminophen   Review of Systems Review of Systems  Constitutional: Positive for appetite change, chills and fever.  HENT: Positive for congestion. Negative for rhinorrhea and sore throat.   Eyes: Negative for redness.  Respiratory: Positive for cough and shortness of breath.   Cardiovascular: Negative for chest pain.  Gastrointestinal: Negative for abdominal pain, diarrhea, nausea and vomiting.  Genitourinary: Negative for dysuria.  Musculoskeletal: Positive for myalgias.  Skin: Negative for rash.  Neurological: Positive for weakness. Negative for headaches.     Physical Exam Updated Vital Signs BP (!) 109/53   Pulse (!) 135   Temp 99 F (37.2 C) (Oral)   Resp (!) 22   SpO2 95%   Physical Exam Vitals signs and nursing note reviewed.  Constitutional:      Appearance: She is well-developed.  HENT:     Head: Normocephalic and atraumatic.     Jaw: No trismus.     Comments: Hard of hearing    Right Ear: Tympanic membrane, ear canal and external ear normal.     Left Ear: Tympanic membrane, ear canal and external ear normal.     Nose: Nose normal. No mucosal edema or rhinorrhea.     Mouth/Throat:     Mouth: Mucous membranes are dry. No oral lesions.     Pharynx: Uvula midline. No oropharyngeal exudate, posterior oropharyngeal erythema or uvula swelling.     Tonsils: No tonsillar abscesses.  Eyes:     General:        Right eye: No discharge.        Left eye: No discharge.     Conjunctiva/sclera: Conjunctivae normal.  Neck:     Musculoskeletal: Normal range of motion and neck supple.  Cardiovascular:     Rate and Rhythm: Regular rhythm. Tachycardia present.     Heart sounds: Normal heart sounds.  Pulmonary:     Effort: Pulmonary effort is normal. No  respiratory distress.     Breath sounds: Normal breath sounds. No wheezing or rales.  Abdominal:     Palpations: Abdomen is soft.     Tenderness: There is no abdominal tenderness.  Musculoskeletal:        General: No swelling.  Lymphadenopathy:     Cervical: No cervical adenopathy.  Skin:    General: Skin is warm and dry.  Neurological:     General: No focal deficit present.     Mental Status: She is alert and oriented to person, place, and time.      ED  Treatments / Results  Labs (all labs ordered are listed, but only abnormal results are displayed) Labs Reviewed  CBC WITH DIFFERENTIAL/PLATELET - Abnormal; Notable for the following components:      Result Value   Hemoglobin 16.7 (*)    HCT 49.2 (*)    Neutro Abs 8.0 (*)    All other components within normal limits  BASIC METABOLIC PANEL - Abnormal; Notable for the following components:   Sodium 134 (*)    Glucose, Bld 113 (*)    Creatinine, Ser 1.67 (*)    Calcium 8.6 (*)    GFR calc non Af Amer 27 (*)    GFR calc Af Amer 32 (*)    All other components within normal limits  INFLUENZA PANEL BY PCR (TYPE A & B)  URINALYSIS, ROUTINE W REFLEX MICROSCOPIC  I-STAT CG4 LACTIC ACID, ED    EKG None  Radiology Dg Chest 2 View  Result Date: 01/27/2018 CLINICAL DATA:  Flu like illness, chills and cough x1 day EXAM: CHEST - 2 VIEW COMPARISON:  02/11/2017 FINDINGS: Pulmonary hyperinflation. Stable cardiomegaly with aortic atherosclerosis. No aneurysm. No acute pulmonary consolidation, effusion or pneumothorax. Tiny few mm irregular opacity in the left upper lobe is nonspecific may reflect minimal atelectasis, postinfectious or inflammatory change. This appears slightly less prominent than on prior suggesting a more infectious or inflammatory etiology than neoplastic. No acute osseous abnormality. IMPRESSION: 1. Stable cardiomegaly with aortic atherosclerosis. 2. No active pulmonary disease.  Pulmonary hyperinflation is noted. 3. Tiny  irregular opacity in the left upper lobe is nonspecific but may reflect minimal atelectasis, postinfectious or inflammatory change. Electronically Signed   By: Tollie Eth M.D.   On: 01/27/2018 22:41    Procedures Procedures (including critical care time)  Medications Ordered in ED Medications - No data to display   Initial Impression / Assessment and Plan / ED Course  I have reviewed the triage vital signs and the nursing notes.  Pertinent labs & imaging results that were available during my care of the patient were reviewed by me and considered in my medical decision making (see chart for details).     Patient seen and examined. Work-up initiated. Fluids ordered.   Vital signs reviewed and are as follows: BP (!) 109/53   Pulse (!) 135   Temp 99 F (37.2 C) (Oral)   Resp (!) 22   SpO2 95%   12:18 AM Patient with AKI. She is very weak. UA and influenza pending.   Given age, weakness, febrile illness, dehydration -- will admit for hydration and monitoring.   BP (!) 140/46   Pulse 97   Temp 99.2 F (37.3 C) (Oral)   Resp 18   SpO2 94%   12:25 AM Spoke with Dr. Toniann Fail who will see.   Final Clinical Impressions(s) / ED Diagnoses   Final diagnoses:  Acute kidney injury (HCC)  Influenza-like illness   Admit.    ED Discharge Orders    None       Renne Crigler, Cordelia Poche 01/28/18 Charlsie Merles, MD 01/28/18 2101

## 2018-01-27 NOTE — Telephone Encounter (Signed)
Spoke with patient daughter Keith Rake.  Daughter states that the patient is having increased weakness, temp 101.9, body aches, dry cough, chest congestion, sore throat, headaches and feet cramping x 3-4 days , worsening. Pt is now having decreased appetite and fluid intake. Pt is very weak and is not able to perform normal daily tasks such as going to the restroom which not but a few steps from her bed. DeeDee states that she is having to pick up and move patient to move her as she is too weak to do this on her own. She states that normally when the patient is sick she does not let it "keep her down" but right now she is "very sick, more sick than her normal."  DeeDee is very skeptical about taking the patient to the ED - I advised along with recommendations from Dr Hassan Rowan that the best treatment given her age and symptoms at this time might be going to the ED. Daughter advised that she may need a lot of fluids which we would not do here in the office. Dr Hassan Rowan states that she is worried with her age that her symptoms could be flu, possible pneumonia or an underlying UTI - we really need to rule these things out to best treat her. Daughter states that the patient does not want go to the ED but she is going to try and get her there. I advised that calling EMS and having EMS transport her may be the best option at this point if she is as severely weak as she describes. DeeDee will keep [place] in the loop and let us know if she ends up going to the ED.   Will send to Dr Selena Batten as Lorain Childes of recommendations given.

## 2018-01-28 ENCOUNTER — Encounter (HOSPITAL_COMMUNITY): Payer: Self-pay | Admitting: Internal Medicine

## 2018-01-28 DIAGNOSIS — N170 Acute kidney failure with tubular necrosis: Secondary | ICD-10-CM

## 2018-01-28 DIAGNOSIS — N179 Acute kidney failure, unspecified: Secondary | ICD-10-CM | POA: Diagnosis not present

## 2018-01-28 DIAGNOSIS — I10 Essential (primary) hypertension: Secondary | ICD-10-CM

## 2018-01-28 DIAGNOSIS — R69 Illness, unspecified: Secondary | ICD-10-CM | POA: Diagnosis not present

## 2018-01-28 DIAGNOSIS — D510 Vitamin B12 deficiency anemia due to intrinsic factor deficiency: Secondary | ICD-10-CM | POA: Diagnosis not present

## 2018-01-28 LAB — CBC WITH DIFFERENTIAL/PLATELET
BASOS PCT: 0 %
Band Neutrophils: 0 %
Basophils Absolute: 0 10*3/uL (ref 0.0–0.1)
Blasts: 0 %
EOS ABS: 0 10*3/uL (ref 0.0–0.5)
Eosinophils Relative: 0 %
HCT: 44.3 % (ref 36.0–46.0)
Hemoglobin: 14.7 g/dL (ref 12.0–15.0)
Lymphocytes Relative: 13 %
Lymphs Abs: 0.9 10*3/uL (ref 0.7–4.0)
MCH: 33.1 pg (ref 26.0–34.0)
MCHC: 33.2 g/dL (ref 30.0–36.0)
MCV: 99.8 fL (ref 80.0–100.0)
MONO ABS: 0.7 10*3/uL (ref 0.1–1.0)
Metamyelocytes Relative: 0 %
Monocytes Relative: 10 %
Myelocytes: 0 %
Neutro Abs: 5.7 10*3/uL (ref 1.7–7.7)
Neutrophils Relative %: 77 %
Other: 0 %
Platelets: 153 10*3/uL (ref 150–400)
Promyelocytes Relative: 0 %
RBC: 4.44 MIL/uL (ref 3.87–5.11)
RDW: 13.6 % (ref 11.5–15.5)
WBC Morphology: INCREASED
WBC: 7.3 10*3/uL (ref 4.0–10.5)
nRBC: 0 % (ref 0.0–0.2)
nRBC: 0 /100 WBC

## 2018-01-28 LAB — RESPIRATORY PANEL BY PCR
ADENOVIRUS-RVPPCR: NOT DETECTED
Bordetella pertussis: NOT DETECTED
CORONAVIRUS OC43-RVPPCR: NOT DETECTED
Chlamydophila pneumoniae: NOT DETECTED
Coronavirus 229E: NOT DETECTED
Coronavirus HKU1: NOT DETECTED
Coronavirus NL63: NOT DETECTED
Influenza A: NOT DETECTED
Influenza B: NOT DETECTED
Metapneumovirus: NOT DETECTED
Mycoplasma pneumoniae: NOT DETECTED
PARAINFLUENZA VIRUS 1-RVPPCR: NOT DETECTED
Parainfluenza Virus 2: NOT DETECTED
Parainfluenza Virus 3: NOT DETECTED
Parainfluenza Virus 4: NOT DETECTED
Respiratory Syncytial Virus: DETECTED — AB
Rhinovirus / Enterovirus: NOT DETECTED

## 2018-01-28 LAB — MAGNESIUM: Magnesium: 1.3 mg/dL — ABNORMAL LOW (ref 1.7–2.4)

## 2018-01-28 LAB — HEPATIC FUNCTION PANEL
ALT: 13 U/L (ref 0–44)
AST: 35 U/L (ref 15–41)
Albumin: 3 g/dL — ABNORMAL LOW (ref 3.5–5.0)
Alkaline Phosphatase: 46 U/L (ref 38–126)
Bilirubin, Direct: 0.2 mg/dL (ref 0.0–0.2)
Indirect Bilirubin: 0.6 mg/dL (ref 0.3–0.9)
Total Bilirubin: 0.8 mg/dL (ref 0.3–1.2)
Total Protein: 6.1 g/dL — ABNORMAL LOW (ref 6.5–8.1)

## 2018-01-28 LAB — BASIC METABOLIC PANEL
Anion gap: 14 (ref 5–15)
BUN: 18 mg/dL (ref 8–23)
CO2: 23 mmol/L (ref 22–32)
Calcium: 7.9 mg/dL — ABNORMAL LOW (ref 8.9–10.3)
Chloride: 99 mmol/L (ref 98–111)
Creatinine, Ser: 1.47 mg/dL — ABNORMAL HIGH (ref 0.44–1.00)
GFR calc non Af Amer: 32 mL/min — ABNORMAL LOW (ref >60)
GFR, EST AFRICAN AMERICAN: 37 mL/min — AB (ref >60)
Glucose, Bld: 93 mg/dL (ref 70–99)
POTASSIUM: 3.8 mmol/L (ref 3.5–5.1)
Sodium: 136 mmol/L (ref 135–145)

## 2018-01-28 LAB — SEDIMENTATION RATE: Sed Rate: 12 mm/hr (ref 0–22)

## 2018-01-28 LAB — TROPONIN I
TROPONIN I: 0.06 ng/mL — AB (ref <0.03)
Troponin I: 0.06 ng/mL (ref <0.03)
Troponin I: 0.05 ng/mL (ref <0.03)
Troponin I: 0.05 ng/mL (ref <0.03)

## 2018-01-28 LAB — PROCALCITONIN: Procalcitonin: 0.22 ng/mL

## 2018-01-28 LAB — INFLUENZA PANEL BY PCR (TYPE A & B)
Influenza A By PCR: NEGATIVE
Influenza B By PCR: NEGATIVE

## 2018-01-28 LAB — BRAIN NATRIURETIC PEPTIDE: B Natriuretic Peptide: 197.6 pg/mL — ABNORMAL HIGH (ref 0.0–100.0)

## 2018-01-28 LAB — TSH
TSH: 1.165 u[IU]/mL (ref 0.350–4.500)
TSH: 0.666 u[IU]/mL (ref 0.350–4.500)

## 2018-01-28 MED ORDER — ONDANSETRON HCL 4 MG/2ML IJ SOLN: 4.0000 mg | Freq: Four times a day (QID) | INTRAMUSCULAR | Status: DC | PRN

## 2018-01-28 MED ORDER — IPRATROPIUM-ALBUTEROL 0.5-2.5 (3) MG/3ML IN SOLN
3.0000 mL | Freq: Three times a day (TID) | RESPIRATORY_TRACT | Status: DC
  Filled 2018-01-28 (×2): qty 3

## 2018-01-28 MED ORDER — HYDROCORTISONE NA SUCCINATE PF 100 MG IJ SOLR
50.0000 mg | Freq: Once | INTRAMUSCULAR | Status: AC
  Administered 2018-01-28: 50 mg via INTRAVENOUS
  Filled 2018-01-28: qty 2

## 2018-01-28 MED ORDER — MAGNESIUM OXIDE 400 (241.3 MG) MG PO TABS
800.0000 mg | ORAL_TABLET | Freq: Two times a day (BID) | ORAL | Status: DC
  Administered 2018-01-29: 800 mg via ORAL
  Filled 2018-01-28: qty 2

## 2018-01-28 MED ORDER — MAGNESIUM SULFATE 50 % IJ SOLN
3.0000 g | Freq: Once | INTRAVENOUS | Status: AC
  Administered 2018-01-28: 3 g via INTRAVENOUS
  Filled 2018-01-28: qty 6

## 2018-01-28 MED ORDER — SODIUM CHLORIDE 0.9 % IV SOLN: INTRAVENOUS | Status: DC

## 2018-01-28 MED ORDER — DOXYCYCLINE HYCLATE 100 MG PO TABS
100.0000 mg | ORAL_TABLET | Freq: Two times a day (BID) | ORAL | Status: DC
  Administered 2018-01-28 – 2018-01-29 (×3): 100 mg via ORAL
  Filled 2018-01-28 (×3): qty 1

## 2018-01-28 MED ORDER — ALBUTEROL SULFATE (2.5 MG/3ML) 0.083% IN NEBU
5.0000 mg | INHALATION_SOLUTION | Freq: Once | RESPIRATORY_TRACT | Status: AC
  Administered 2018-01-28: 5 mg via RESPIRATORY_TRACT
  Filled 2018-01-28: qty 6

## 2018-01-28 MED ORDER — VITAMIN B-12 1000 MCG PO TABS
1000.0000 ug | ORAL_TABLET | Freq: Every day | ORAL | Status: DC
  Administered 2018-01-28 – 2018-01-29 (×2): 1000 ug via ORAL
  Filled 2018-01-28: qty 10
  Filled 2018-01-28 (×2): qty 1

## 2018-01-28 MED ORDER — DIPHENHYDRAMINE HCL 25 MG PO CAPS: 25.0000 mg | ORAL_CAPSULE | Freq: Every evening | ORAL | Status: DC | PRN

## 2018-01-28 MED ORDER — ACETAMINOPHEN 325 MG PO TABS: 650.0000 mg | ORAL_TABLET | Freq: Four times a day (QID) | ORAL | Status: DC | PRN

## 2018-01-28 MED ORDER — ALBUTEROL SULFATE (2.5 MG/3ML) 0.083% IN NEBU: 2.5000 mg | INHALATION_SOLUTION | Freq: Four times a day (QID) | RESPIRATORY_TRACT | Status: DC | PRN

## 2018-01-28 MED ORDER — HYDRALAZINE HCL 20 MG/ML IJ SOLN: 5.0000 mg | INTRAMUSCULAR | Status: DC | PRN

## 2018-01-28 MED ORDER — ONDANSETRON HCL 4 MG PO TABS: 4.0000 mg | ORAL_TABLET | Freq: Four times a day (QID) | ORAL | Status: DC | PRN

## 2018-01-28 MED ORDER — ACETAMINOPHEN 650 MG RE SUPP: 650.0000 mg | Freq: Four times a day (QID) | RECTAL | Status: DC | PRN

## 2018-01-28 MED ORDER — SODIUM CHLORIDE 0.9 % IV SOLN
INTRAVENOUS | Status: DC
  Administered 2018-01-28: 05:00:00 via INTRAVENOUS

## 2018-01-28 MED ORDER — ALBUTEROL SULFATE (2.5 MG/3ML) 0.083% IN NEBU
5.0000 mg | INHALATION_SOLUTION | RESPIRATORY_TRACT | Status: DC
  Administered 2018-01-28 (×2): 5 mg via RESPIRATORY_TRACT
  Filled 2018-01-28 (×2): qty 6

## 2018-01-28 MED ORDER — CALCIUM CARBONATE 1250 (500 CA) MG PO TABS
500.0000 mg | ORAL_TABLET | Freq: Two times a day (BID) | ORAL | Status: DC
  Administered 2018-01-28 – 2018-01-29 (×4): 500 mg via ORAL
  Filled 2018-01-28 (×5): qty 1

## 2018-01-28 MED ORDER — PREDNISONE 5 MG PO TABS
2.5000 mg | ORAL_TABLET | Freq: Every day | ORAL | Status: DC
  Administered 2018-01-28 – 2018-01-29 (×2): 2.5 mg via ORAL
  Filled 2018-01-28 (×3): qty 1

## 2018-01-28 MED ORDER — DILTIAZEM HCL-DEXTROSE 100-5 MG/100ML-% IV SOLN (PREMIX)
5.0000 mg/h | INTRAVENOUS | Status: DC
  Filled 2018-01-28: qty 100

## 2018-01-28 MED ORDER — VITAMIN C 500 MG PO TABS
500.0000 mg | ORAL_TABLET | Freq: Every day | ORAL | Status: DC
  Administered 2018-01-28 – 2018-01-29 (×2): 500 mg via ORAL
  Filled 2018-01-28 (×3): qty 1

## 2018-01-28 MED ORDER — DILTIAZEM HCL 25 MG/5ML IV SOLN
10.0000 mg | Freq: Once | INTRAVENOUS | Status: AC
  Administered 2018-01-28: 10 mg via INTRAVENOUS
  Filled 2018-01-28: qty 5

## 2018-01-28 MED ORDER — ENOXAPARIN SODIUM 30 MG/0.3ML ~~LOC~~ SOLN
30.0000 mg | Freq: Every day | SUBCUTANEOUS | Status: DC
  Administered 2018-01-28 – 2018-01-29 (×2): 30 mg via SUBCUTANEOUS
  Filled 2018-01-28 (×3): qty 0.3

## 2018-01-28 MED ORDER — DILTIAZEM HCL 60 MG PO TABS
30.0000 mg | ORAL_TABLET | Freq: Four times a day (QID) | ORAL | Status: DC
  Administered 2018-01-28 – 2018-01-29 (×5): 30 mg via ORAL
  Filled 2018-01-28 (×6): qty 1

## 2018-01-28 NOTE — Evaluation (Signed)
Physical Therapy Evaluation Patient Details Name: [name] MRN: [id number] DOB: 12-25-1928 Today's Date: 01/28/2018   History of Present Illness  [name] is an 85 y.o. female with history of hypertension, polymyalgia rheumatica, pernicious anemia presents to the ER with complaints of feeling weak with some shortness of breath, cough and flulike symptoms for the last 4 days.  Clinical Impression  Pt is at or close to baseline functioning and should be safe at home with PRN assist from her family. There are no further acute PT needs.  Will sign off at this time.     Follow Up Recommendations No PT follow up    Equipment Recommendations  None recommended by PT    Recommendations for Other Services       Precautions / Restrictions Precautions Precautions: Fall      Mobility  Bed Mobility Overal bed mobility: Independent                Transfers Overall transfer level: Modified independent Equipment used: Straight cane                Ambulation/Gait Ambulation/Gait assistance: Supervision Gait Distance (Feet): 125 Feet(140 feet, 40 feet without the cane) Assistive device: Straight cane;None Gait Pattern/deviations: Step-through pattern Gait velocity: slower Gait velocity interpretation: <1.8 ft/sec, indicate of risk for recurrent falls General Gait Details: generally steady with sats on RA staying at 91-94%, dyspnea 1-2/4, EHR 90's.  With no AD, pt more guarded and slower, but no overt deviation  Stairs Stairs: Yes Stairs assistance: Min guard Stair Management: One rail Right;Step to pattern;Forwards Number of Stairs: 3 General stair comments: safe with a sturdy rail  Wheelchair Mobility    Modified Rankin (Stroke Patients Only)       Balance Overall balance assessment: Mild deficits observed, not formally tested                                           Pertinent Vitals/Pain Pain Assessment: No/denies pain     Home Living Family/patient expects to be discharged to:: Private residence Living Arrangements: Spouse/significant other Available Help at Discharge: Family;Available 24 hours/day Type of Home: House Home Access: Stairs to enter Entrance Stairs-Rails: Doctor, general practice of Steps: 2 Home Layout: One level Home Equipment: Walker - 2 wheels;Bedside commode;Tub bench;Grab bars - tub/shower;Adaptive equipment Additional Comments: has been doing her own ADL's    Prior Function Level of Independence: Independent with assistive device(s)         Comments: cane outside the house     Hand Dominance        Extremity/Trunk Assessment   Upper Extremity Assessment Upper Extremity Assessment: Overall WFL for tasks assessed    Lower Extremity Assessment Lower Extremity Assessment: Overall WFL for tasks assessed(general weakness large muscle groups, but functional)       Communication   Communication: HOH  Cognition Arousal/Alertness: Awake/alert Behavior During Therapy: WFL for tasks assessed/performed Overall Cognitive Status: Within Functional Limits for tasks assessed                                        General Comments      Exercises     Assessment/Plan    PT Assessment Patent does not need any further PT services  PT Problem List  PT Treatment Interventions      PT Goals (Current goals can be found in the Care Plan section)  Acute Rehab PT Goals Patient Stated Goal: get home once deemed well PT Goal Formulation: All assessment and education complete, DC therapy    Frequency     Barriers to discharge        Co-evaluation               AM-PAC PT "6 Clicks" Mobility  Outcome Measure Help needed turning from your back to your side while in a flat bed without using bedrails?: None Help needed moving from lying on your back to sitting on the side of a flat bed without using bedrails?: None Help needed  moving to and from a bed to a chair (including a wheelchair)?: None Help needed standing up from a chair using your arms (e.g., wheelchair or bedside chair)?: None Help needed to walk in hospital room?: A Little Help needed climbing 3-5 steps with a railing? : A Little 6 Click Score: 22    End of Session Equipment Utilized During Treatment: Oxygen Activity Tolerance: Patient tolerated treatment well;Patient limited by fatigue Patient left: in bed;with call bell/phone within reach;with family/visitor present Nurse Communication: Mobility status PT Visit Diagnosis: Unsteadiness on feet (R26.81);Difficulty in walking, not elsewhere classified (R26.2)    Time: 7544-9201 PT Time Calculation (min) (ACUTE ONLY): 22 min   Charges:   PT Evaluation $PT Eval Low Complexity: 1 Low          01/28/2018  [place] [name], PT Acute Rehabilitation Services [phone]  (pager) [phone]  (office)  [name] 01/28/2018, 6:20 PM

## 2018-01-28 NOTE — Progress Notes (Signed)
Patient seen and examined in the ER 85 y.o. female with history of hypertension, polymyalgia rheumatica, pernicious anemia presents to the ER with complaints of feeling weak with some shortness of breath cough and flulike symptoms for the last 4 days.  ED Course: In the ER patient had some cough nonproductive chest x-ray showing nonspecific findings in the left upper lobe.  Labs revealed acute renal failure.  Also CBC shows concentrated hemoglobin.  Flu panel was negative. Patient noted to be in atrial fibrillation with rapid ventricular response  according to the family, patient has a history of alcohol dependence Currently the patient lives independently at home, and drives independently and has access to alcohol at all times  Patient noted to be hypomagnesemic, magnesium 1.3 which was repleted Patient currently on doxycycline for acute bronchitis Patient started  On oral Cardizem after 10 mg IV push Discussed candidacy for long-term anticoagulation, for A. Fib, with patient's daughters. They are agreeable to defer long term anticoagulation, given the fact that the patient is probably going to continue drinking alcohol long-term. She may miss medication dosages, she may be noncompliant with it  Patient is currently under observation for her acute bronchitis Anticipate she will continue as observation and  Tomorrow, assess for home oxygen needs prior to discharge

## 2018-01-28 NOTE — ED Notes (Signed)
Pt ambulated with a cane around nurses station. Oxygen 89% upon returning.

## 2018-01-28 NOTE — ED Notes (Signed)
Dr. Susie Cassette contacted with cardiac rhythm and rate.

## 2018-01-28 NOTE — H&P (Addendum)
History and Physical    MEERA WILHITE BZJ:[id number] DOB: 06/16/1928 DOA: 01/27/2018  PCP: Terressa Koyanagi, DO  Patient coming from: Home.  Chief Complaint: Flulike symptoms  HPI: [name] is a 85 y.o. female with history of hypertension, polymyalgia rheumatica, pernicious anemia presents to the ER with complaints of feeling weak with some shortness of breath cough and flulike symptoms for the last 4 days.  Also has not been eating well for last 2 days denies any abdominal pain has some nausea denies any vomiting or diarrhea.  Feeling weak generally.  ED Course: In the ER patient had some cough nonproductive chest x-ray showing nonspecific findings in the left upper lobe.  Labs revealed acute renal failure.  Also CBC shows concentrated hemoglobin.  Flu panel was negative.  Patient was given 400 cc normal saline bolus for the acute renal failure admitted for further observation.  Review of Systems: As per HPI, rest all negative.   Past Medical History:  Diagnosis Date  . Alcohol abuse   . Emphysema of lung (HCC) 01/26/2009   pt. unsure of this reports as of 07/31/2012  . Fracture of fifth metatarsal bone of right foot 08/29/2012  . Gout 08/11/2012  . Hyperlipemia 03/27/2007  . Hypertension   . Osteoporosis 07/12/2006  . Pernicious anemia 10/25/2006  . Polymyalgia rheumatica (HCC) 03/27/2007   sees Dr. Kellie Simmering in rheumatology  . Vitamin D deficiency 07/12/2006    Past Surgical History:  Procedure Laterality Date  . APPENDECTOMY  07/12/2006  . LUMBAR LAMINECTOMY/DECOMPRESSION MICRODISCECTOMY Right 02/13/2017   Procedure: Microdiscectomy - right - Lumbar three-Lumbar four;  Surgeon: Tia Alert, MD;  Location: [facility] OR;  Service: Neurosurgery;  Laterality: Right;  . PARTIAL HIP ARTHROPLASTY     left side- 2x's ( 2nd surgery to correct 1st replacement), R side -   . TOOTH EXTRACTION N/A 08/04/2012   Procedure: EXTRACTIONS 17, 20;  Surgeon: Georgia Lopes, DDS;  Location: [facility] OR;   Service: Oral Surgery;  Laterality: N/A;     reports that she quit smoking about 11 years ago. She started smoking about 70 years ago. She has a 116.00 pack-year smoking history. She has never used smokeless tobacco. She reports that she does not drink alcohol or use drugs.  Allergies  Allergen Reactions  . Aspirin Other (See Comments)    "threw up blood"  . Codeine Other (See Comments)    "threw up blood"  . Naproxen Other (See Comments)    Severe pain in joints and hands  . Oxycodone-Acetaminophen     REACTION: gastric reaction    Family History  Problem Relation Age of Onset  . Mental illness Mother        alzheimer dementia  . Ulcers Father   . Heart disease Father   . Cancer Father        lung, smoker    Prior to Admission medications   Medication Sig Start Date End Date Taking? Authorizing Provider  alendronate (FOSAMAX) 70 MG tablet TAKE 1 TABLET BY MOUTH ONCE A WEEK Patient taking differently: Take 70 mg by mouth once a week.  12/09/17  Yes Terressa Koyanagi, DO  Cyanocobalamin (VITAMIN B-12) 1000 MCG SUBL Place 1,000 mcg under the tongue daily.    Yes [provider]  diphenhydrAMINE (BENADRYL) 25 MG tablet Take 25 mg by mouth at bedtime as needed for sleep.   Yes [provider]  hydrochlorothiazide (HYDRODIURIL) 12.5 MG tablet Take 1 tablet (12.5 mg  total) by mouth daily. 09/19/17  Yes Terressa Koyanagi, DO  Oyster Shell (OYSTER CALCIUM) 500 MG TABS tablet Take 1,000 mg of elemental calcium by mouth daily.   Yes [provider]  predniSONE (DELTASONE) 2.5 MG tablet Take 1 tablet (2.5 mg total) by mouth daily with breakfast. 09/19/17  Yes [name] R, DO  vitamin C (ASCORBIC ACID) 250 MG tablet Take 500 mg by mouth daily.   Yes [provider]    Physical Exam: Vitals:   01/28/18 0130 01/28/18 0215 01/28/18 0230 01/28/18 0300  BP: 138/86 126/63 121/60 (!) 116/56  Pulse: 67 90 97 71  Resp:      Temp:      TempSrc:      SpO2: 93% 98%  93% 94%      Constitutional: Moderately built and nourished. Vitals:   01/28/18 0130 01/28/18 0215 01/28/18 0230 01/28/18 0300  BP: 138/86 126/63 121/60 (!) 116/56  Pulse: 67 90 97 71  Resp:      Temp:      TempSrc:      SpO2: 93% 98% 93% 94%   Eyes: Anicteric no pallor. ENMT: No discharge from the ears eyes nose or mouth. Neck: No mass felt.  No neck rigidity. Respiratory: Mild expiratory wheeze and no crepitations. Cardiovascular: S1-S2 heard. Abdomen: Soft nontender bowel sounds present. Musculoskeletal: No edema.  No joint effusion. Skin: No rash. Neurologic: Alert awake oriented to time place and person.  Moves all extremities. Psychiatric: Appears normal.  Normal affect.   Labs on Admission: I have personally reviewed following labs and imaging studies  CBC: Recent Labs  Lab 01/27/18 2256  WBC 9.5  NEUTROABS 8.0*  HGB 16.7*  HCT 49.2*  MCV 98.2  PLT 186   Basic Metabolic Panel: Recent Labs  Lab 01/27/18 2256  NA 134*  K 3.9  CL 98  CO2 24  GLUCOSE 113*  BUN 18  CREATININE 1.67*  CALCIUM 8.6*   GFR: CrCl cannot be calculated (Unknown ideal weight.). Liver Function Tests: No results for input(s): AST, ALT, ALKPHOS, BILITOT, PROT, ALBUMIN in the last 168 hours. No results for input(s): LIPASE, AMYLASE in the last 168 hours. No results for input(s): AMMONIA in the last 168 hours. Coagulation Profile: No results for input(s): INR, PROTIME in the last 168 hours. Cardiac Enzymes: No results for input(s): CKTOTAL, CKMB, CKMBINDEX, TROPONINI in the last 168 hours. BNP (last 3 results) No results for input(s): PROBNP in the last 8760 hours. HbA1C: No results for input(s): HGBA1C in the last 72 hours. CBG: No results for input(s): GLUCAP in the last 168 hours. Lipid Profile: No results for input(s): CHOL, HDL, LDLCALC, TRIG, CHOLHDL, LDLDIRECT in the last 72 hours. Thyroid Function Tests: No results for input(s): TSH, T4TOTAL, FREET4, T3FREE,  THYROIDAB in the last 72 hours. Anemia Panel: No results for input(s): VITAMINB12, FOLATE, FERRITIN, TIBC, IRON, RETICCTPCT in the last 72 hours. Urine analysis:    Component Value Date/Time   COLORURINE YELLOW 07/01/2010 2207   APPEARANCEUR CLEAR 07/01/2010 2207   LABSPEC 1.011 07/01/2010 2207   PHURINE 6.0 07/01/2010 2207   GLUCOSEU NEGATIVE 07/01/2010 2207   HGBUR NEGATIVE 07/01/2010 2207   HGBUR negative 06/01/2008 0943   BILIRUBINUR n 10/02/2012 1029   KETONESUR NEGATIVE 07/01/2010 2207   PROTEINUR 2+ 10/02/2012 1029   PROTEINUR NEGATIVE 07/01/2010 2207   UROBILINOGEN 0.2 10/02/2012 1029   UROBILINOGEN 0.2 07/01/2010 2207   NITRITE n 10/02/2012 1029   NITRITE NEGATIVE 07/01/2010 2207  LEUKOCYTESUR large (3+) 10/02/2012 1029   Sepsis Labs: @LABRCNTIP (procalcitonin:4,lacticidven:4) )No results found for this or any previous visit (from the past 240 hour(s)).   Radiological Exams on Admission: Dg Chest 2 View  Result Date: 01/27/2018 CLINICAL DATA:  Flu like illness, chills and cough x1 day EXAM: CHEST - 2 VIEW COMPARISON:  02/11/2017 FINDINGS: Pulmonary hyperinflation. Stable cardiomegaly with aortic atherosclerosis. No aneurysm. No acute pulmonary consolidation, effusion or pneumothorax. Tiny few mm irregular opacity in the left upper lobe is nonspecific may reflect minimal atelectasis, postinfectious or inflammatory change. This appears slightly less prominent than on prior suggesting a more infectious or inflammatory etiology than neoplastic. No acute osseous abnormality. IMPRESSION: 1. Stable cardiomegaly with aortic atherosclerosis. 2. No active pulmonary disease.  Pulmonary hyperinflation is noted. 3. Tiny irregular opacity in the left upper lobe is nonspecific but may reflect minimal atelectasis, postinfectious or inflammatory change. Electronically Signed   By: Tollie Ethavid  Kwon M.D.   On: 01/27/2018 22:41     Assessment/Plan Principal Problem:   ARF (acute renal failure)  (HCC) Active Problems:   Pernicious anemia   Essential hypertension   POLYMYALGIA RHEUMATICA    1. Acute renal failure likely from poor oral intake -patient did receive 500 cc normal saline bolus.  Chest x-ray shows some cardiomegaly so we will be cautious about hydrating further.  UA is pending.  Follow metabolic panel.  Will hold hydrochlorothiazide. 2. Acute bronchitis with flulike symptoms -patient is wheezing on exam for which patient will be kept on nebulizer treatment.  Influenza PCR was negative.  Will check respiratory viral panel.  We will also check troponin BNP.  Chest x-ray shows some nonspecific findings in the left upper lobe.  I have ordered procalcitonin levels.  Until then patient will be on doxycycline.  Sputum cultures. 3. Hypertension -due to acute renal failure will hold hydrochlorothiazide.  Keep patient on PRN IV hydralazine. 4. History of polymyalgia rheumatica -on prednisone.  Patient recently tried to discontinue prednisone about a week ago following which patient became weak.  Restarted again.  Will give 1 stress dose of hydrocortisone.  Check sed rate to see if there is any exacerbation. 5. Pernicious anemia on B12 supplements.   DVT prophylaxis: Lovenox. Code Status: Full code. Family Communication: No family at the bedside. Disposition Plan: Home. Consults called: None. Admission status: Observation.   Eduard ClosArshad N Willine Schwalbe MD [facility] Pager [phone].  If 7PM-7AM, please contact night-coverage [email or web address] Password [email or web address]  01/28/2018, 3:48 AM

## 2018-01-28 NOTE — ED Notes (Signed)
NSR with PAC noted.

## 2018-01-28 NOTE — ED Notes (Signed)
Dr. Susie Cassette secure text to question albuterol neb and inform of rhythm change

## 2018-01-28 NOTE — Telephone Encounter (Signed)
I called the pts daughter and informed her of the message below.  She stated she took the pt by car yesterday, has had a number of tests, she is still in the ER and was told she will be admitted.  Message sent to Dr Selena Batten as Lorain Childes.

## 2018-01-28 NOTE — ED Notes (Signed)
bfast tray ordered 

## 2018-01-29 ENCOUNTER — Observation Stay (HOSPITAL_BASED_OUTPATIENT_CLINIC_OR_DEPARTMENT_OTHER): Payer: Medicare Other

## 2018-01-29 DIAGNOSIS — I10 Essential (primary) hypertension: Secondary | ICD-10-CM | POA: Diagnosis not present

## 2018-01-29 DIAGNOSIS — M353 Polymyalgia rheumatica: Secondary | ICD-10-CM

## 2018-01-29 DIAGNOSIS — R69 Illness, unspecified: Secondary | ICD-10-CM | POA: Diagnosis not present

## 2018-01-29 DIAGNOSIS — N170 Acute kidney failure with tubular necrosis: Secondary | ICD-10-CM | POA: Diagnosis not present

## 2018-01-29 DIAGNOSIS — D510 Vitamin B12 deficiency anemia due to intrinsic factor deficiency: Secondary | ICD-10-CM | POA: Diagnosis not present

## 2018-01-29 DIAGNOSIS — J210 Acute bronchiolitis due to respiratory syncytial virus: Secondary | ICD-10-CM

## 2018-01-29 DIAGNOSIS — R0602 Shortness of breath: Secondary | ICD-10-CM | POA: Diagnosis not present

## 2018-01-29 LAB — ECHOCARDIOGRAM COMPLETE: Weight: 1679.02 oz

## 2018-01-29 MED ORDER — DOXYCYCLINE HYCLATE 100 MG PO TABS: 100.0000 mg | ORAL_TABLET | Freq: Two times a day (BID) | ORAL | 0 refills | Status: DC

## 2018-01-29 NOTE — Discharge Instructions (Signed)
Hold HCTZ for now and monitor your BP. F/u with your PCP next wee to determine if HCTZ needs to be resumed.  Remember to discuss the issues with abnormal heart rhythm with your PCP at the next visit.  You were cared for by a hospitalist during your hospital stay. If you have any questions about your discharge medications or the care you received while you were in the hospital after you are discharged, you can call the unit and asked to speak with the hospitalist on call if the hospitalist that took care of you is not available. Once you are discharged, your primary care physician will handle any further medical issues.   Please note that NO REFILLS for any discharge medications will be authorized once you are discharged, as it is imperative that you return to your primary care physician (or establish a relationship with a primary care physician if you do not have one) for your aftercare needs so that they can reassess your need for medications and monitor your lab values.  Please take all your medications with you for your next visit with your Primary MD. Please ask your Primary MD to get all Hospital records sent to his/her office. Please request your Primary MD to go over all hospital test results at the follow up.   If you experience worsening of your admission symptoms, develop shortness of breath, chest pain, suicidal or homicidal thoughts or a life threatening emergency, you must seek medical attention immediately by calling 911 or calling your MD.   [name] must read the complete instructions/literature along with all the possible adverse reactions/side effects for all the medicines you take including new medications that have been prescribed to you. Take new medicines after you have completely understood and accpet all the possible adverse reactions/side effects.    Do not drive when taking pain medications or sedatives.     Do not take more than prescribed Pain, Sleep and Anxiety Medications   If you have smoked or chewed Tobacco in the last 2 yrs please stop. Stop any regular alcohol  and or recreational drug use.   Wear Seat belts while driving.

## 2018-01-29 NOTE — Care Management Obs Status (Signed)
MEDICARE OBSERVATION STATUS NOTIFICATION   Patient Details  Name: [name] MRN: [id number] Date of Birth: Mar 21, 1928   Medicare Observation Status Notification Given:  Other (see comment)(NCM explained notice to daughter, [name] [phone]) per pt's request. Notice left @ bedside)    Epifanio Lesches, RN 01/29/2018, 9:23 AM

## 2018-01-29 NOTE — Discharge Summary (Signed)
Physician Discharge Summary  [name] QQP:[id number] DOB: Jun 07, 1928 DOA: 01/27/2018  PCP: Terressa Koyanagi, DO  Admit date: 01/27/2018 Discharge date: 01/29/2018  Admitted From: home Disposition:  home   Recommendations for Outpatient Follow-up:  1. F/u on atrial tachycardia     Discharge Condition:  stable   CODE STATUS:  Full code   Consultations:  none    Discharge Diagnoses:  Principal Problem:   ARF (acute renal failure) (HCC) Active Problems:   Pernicious anemia   Essential hypertension   POLYMYALGIA RHEUMATICA   Brief Summary: [name] is a 85 y.o. female with history of hypertension, polymyalgia rheumatica, pernicious anemia presents to the ER with complaints of feeling weak with some shortness of breath cough and flulike symptoms for the last 4 days.  Also has not been eating well for last 2 days denies any abdominal pain has some nausea denies any vomiting or diarrhea. CXR in ED >  Tiny irregular opacity in the left upper lobe is nonspecific but may reflect minimal atelectasis, postinfectious or inflammatory change  Hospital Course:  AKI - Cr 1.67- baseline Cr 0.94 - though to be due to dehydration- HCTZ held and IVF given  ? A-fib - Dr Susie Cassette felt the patient has A-fib - I have looked the EKG and rhythm strips and have reviewed them with Dr Excell Seltzer- seems to be sinus rhythm with very short runs of atrial tachycardia - she is completely asymptomatic - ECHO shows normal EF and grade 1 d CHF, LA mildly dilated - at this time, the patient states that she does not want anticoagulation even if she has A-fib- family states she drinks too much to be safely anticoagulated (which the patient denies)- she will f/u with her PCP if she changes her mind  Alcohol abuse - per patient she drinks 1-2 glasses of wine in a day but not on a daily basis - her daughter at bedside and family who spoke with  Dr Susie Cassette yesterday disagree and state the patient drinks  more  Acute bronchitis/ RSV +  - started on Doxycycline- symptoms have improved-   H/o HTN  - BP fluctuating- cont to hold HCTZ for now until f/u visit with PCP  Pernicious anemia - cont B12   Discharge Exam: Vitals:   01/29/18 0347 01/29/18 1440  BP: (!) 133/53 (!) 106/49  Pulse: 67 60  Resp: 17 16  Temp: 98.6 F (37 C) 98.6 F (37 C)  SpO2: 95% 94%   Vitals:   01/28/18 2118 01/29/18 0347 01/29/18 0421 01/29/18 1440  BP: 93/63 (!) 133/53  (!) 106/49  Pulse: 79 67  60  Resp: 16 17  16   Temp: 97.9 F (36.6 C) 98.6 F (37 C)  98.6 F (37 C)  TempSrc: Oral Oral  Oral  SpO2: 96% 95%  94%  Weight:   47.6 kg     General: Pt is alert, awake, not in acute distress Cardiovascular: RRR, S1/S2 +, no rubs, no gallops Respiratory: CTA bilaterally, no wheezing, no rhonchi Abdominal: Soft, NT, ND, bowel sounds + Extremities: no edema, no cyanosis   Discharge Instructions  Discharge Instructions    Diet - low sodium heart healthy   Complete by:  As directed    Increase activity slowly   Complete by:  As directed      Allergies as of 01/29/2018      Reactions   Aspirin Other (See Comments)   "threw up blood"   Codeine Other (See Comments)   "  threw up blood"   Naproxen Other (See Comments)   Severe pain in joints and hands   Oxycodone-acetaminophen    REACTION: gastric reaction      Medication List    STOP taking these medications   hydrochlorothiazide 12.5 MG tablet Commonly known as:  HYDRODIURIL     TAKE these medications   alendronate 70 MG tablet Commonly known as:  FOSAMAX TAKE 1 TABLET BY MOUTH ONCE A WEEK What changed:    how much to take  how to take this  when to take this  additional instructions   diphenhydrAMINE 25 MG tablet Commonly known as:  BENADRYL Take 25 mg by mouth at bedtime as needed for sleep.   doxycycline 100 MG tablet Commonly known as:  VIBRA-TABS Take 1 tablet (100 mg total) by mouth every 12 (twelve) hours.    oyster calcium 500 MG Tabs tablet Take 1,000 mg of elemental calcium by mouth daily.   predniSONE 2.5 MG tablet Commonly known as:  DELTASONE Take 1 tablet (2.5 mg total) by mouth daily with breakfast.   Vitamin B-12 1000 MCG Subl Place 1,000 mcg under the tongue daily.   vitamin C 250 MG tablet Commonly known as:  ASCORBIC ACID Take 500 mg by mouth daily.      Follow-up Information    Terressa KoyanagiKim, Hannah R, DO Follow up.   Specialty:  Family Medicine Why:  f/u next week Contact information: [address] [place] [address] [phone]          Allergies  Allergen Reactions  . Aspirin Other (See Comments)    "threw up blood"  . Codeine Other (See Comments)    "threw up blood"  . Naproxen Other (See Comments)    Severe pain in joints and hands  . Oxycodone-Acetaminophen     REACTION: gastric reaction     Procedures/Studies: 2 D ECHO Study Conclusions  - Left ventricle: The cavity size was normal. Wall thickness was   normal. Systolic function was normal. The estimated ejection   fraction was in the range of 60% to 65%. Wall motion was normal;   there were no regional wall motion abnormalities. Doppler   parameters are consistent with abnormal left ventricular   relaxation (grade 1 diastolic dysfunction). - Aortic valve: There was no stenosis. - Mitral valve: There was trivial regurgitation. - Left atrium: The atrium was mildly dilated. - Right ventricle: The cavity size was normal. Systolic function   was normal. - Pulmonary arteries: No complete TR doppler jet so unable to   estimate PA systolic pressure. - Inferior vena cava: The vessel was normal in size. The   respirophasic diameter changes were in the normal range (= 50%),   consistent with normal central venous pressure.  Impressions:  - Normal LV size with EF 60-65%. Normal RV size and systolic   function. No significant valvular abnormalities.  Dg Chest 2 View  Result Date:  01/27/2018 CLINICAL DATA:  Flu like illness, chills and cough x1 day EXAM: CHEST - 2 VIEW COMPARISON:  02/11/2017 FINDINGS: Pulmonary hyperinflation. Stable cardiomegaly with aortic atherosclerosis. No aneurysm. No acute pulmonary consolidation, effusion or pneumothorax. Tiny few mm irregular opacity in the left upper lobe is nonspecific may reflect minimal atelectasis, postinfectious or inflammatory change. This appears slightly less prominent than on prior suggesting a more infectious or inflammatory etiology than neoplastic. No acute osseous abnormality. IMPRESSION: 1. Stable cardiomegaly with aortic atherosclerosis. 2. No active pulmonary disease.  Pulmonary hyperinflation is noted. 3.  Tiny irregular opacity in the left upper lobe is nonspecific but may reflect minimal atelectasis, postinfectious or inflammatory change. Electronically Signed   By: Tollie Ethavid  Kwon M.D.   On: 01/27/2018 22:41     The results of significant diagnostics from this hospitalization (including imaging, microbiology, ancillary and laboratory) are listed below for reference.     Microbiology: Recent Results (from the past 240 hour(s))  Respiratory Panel by PCR     Status: Abnormal   Collection Time: 01/28/18 10:13 AM  Result Value Ref Range Status   Adenovirus NOT DETECTED NOT DETECTED Final   Coronavirus 229E NOT DETECTED NOT DETECTED Final   Coronavirus HKU1 NOT DETECTED NOT DETECTED Final   Coronavirus NL63 NOT DETECTED NOT DETECTED Final   Coronavirus OC43 NOT DETECTED NOT DETECTED Final   Metapneumovirus NOT DETECTED NOT DETECTED Final   Rhinovirus / Enterovirus NOT DETECTED NOT DETECTED Final   Influenza A NOT DETECTED NOT DETECTED Final   Influenza B NOT DETECTED NOT DETECTED Final   Parainfluenza Virus 1 NOT DETECTED NOT DETECTED Final   Parainfluenza Virus 2 NOT DETECTED NOT DETECTED Final   Parainfluenza Virus 3 NOT DETECTED NOT DETECTED Final   Parainfluenza Virus 4 NOT DETECTED NOT DETECTED Final    Respiratory Syncytial Virus DETECTED (A) NOT DETECTED Final    Comment: CRITICAL RESULT CALLED TO, READ BACK BY AND VERIFIED WITH: Randal BubaK.  Byrd RN 13:35 01/28/18 ([email or web address])    Bordetella pertussis NOT DETECTED NOT DETECTED Final   Chlamydophila pneumoniae NOT DETECTED NOT DETECTED Final   Mycoplasma pneumoniae NOT DETECTED NOT DETECTED Final    Comment: Performed at [facility], [address] [place], [place] [address]     Labs: BNP (last 3 results) Recent Labs    01/28/18 0917  BNP 197.6*   Basic Metabolic Panel: Recent Labs  Lab 01/27/18 2256 01/28/18 0411  NA 134* 136  K 3.9 3.8  CL 98 99  CO2 24 23  GLUCOSE 113* 93  BUN 18 18  CREATININE 1.67* 1.47*  CALCIUM 8.6* 7.9*  MG  --  1.3*   Liver Function Tests: Recent Labs  Lab 01/28/18 0411  AST 35  ALT 13  ALKPHOS 46  BILITOT 0.8  PROT 6.1*  ALBUMIN 3.0*   No results for input(s): LIPASE, AMYLASE in the last 168 hours. No results for input(s): AMMONIA in the last 168 hours. CBC: Recent Labs  Lab 01/27/18 2256 01/28/18 0411  WBC 9.5 7.3  NEUTROABS 8.0* 5.7  HGB 16.7* 14.7  HCT 49.2* 44.3  MCV 98.2 99.8  PLT 186 153   Cardiac Enzymes: Recent Labs  Lab 01/28/18 0411 01/28/18 0919 01/28/18 1356 01/28/18 1839  TROPONINI 0.06* 0.05* 0.06* 0.05*   BNP: Invalid input(s): POCBNP CBG: No results for input(s): GLUCAP in the last 168 hours. D-Dimer No results for input(s): DDIMER in the last 72 hours. Hgb A1c No results for input(s): HGBA1C in the last 72 hours. Lipid Profile No results for input(s): CHOL, HDL, LDLCALC, TRIG, CHOLHDL, LDLDIRECT in the last 72 hours. Thyroid function studies Recent Labs    01/28/18 1356  TSH 0.666   Anemia work up No results for input(s): VITAMINB12, FOLATE, FERRITIN, TIBC, IRON, RETICCTPCT in the last 72 hours. Urinalysis    Component Value Date/Time   COLORURINE YELLOW 07/01/2010 2207   APPEARANCEUR CLEAR 07/01/2010 2207   LABSPEC 1.011 07/01/2010 2207    PHURINE 6.0 07/01/2010 2207   GLUCOSEU NEGATIVE 07/01/2010 2207   HGBUR NEGATIVE 07/01/2010 2207  HGBUR negative 06/01/2008 0943   BILIRUBINUR n 10/02/2012 1029   KETONESUR NEGATIVE 07/01/2010 2207   PROTEINUR 2+ 10/02/2012 1029   PROTEINUR NEGATIVE 07/01/2010 2207   UROBILINOGEN 0.2 10/02/2012 1029   UROBILINOGEN 0.2 07/01/2010 2207   NITRITE n 10/02/2012 1029   NITRITE NEGATIVE 07/01/2010 2207   LEUKOCYTESUR large (3+) 10/02/2012 1029   Sepsis Labs Invalid input(s): PROCALCITONIN,  WBC,  LACTICIDVEN Microbiology Recent Results (from the past 240 hour(s))  Respiratory Panel by PCR     Status: Abnormal   Collection Time: 01/28/18 10:13 AM  Result Value Ref Range Status   Adenovirus NOT DETECTED NOT DETECTED Final   Coronavirus 229E NOT DETECTED NOT DETECTED Final   Coronavirus HKU1 NOT DETECTED NOT DETECTED Final   Coronavirus NL63 NOT DETECTED NOT DETECTED Final   Coronavirus OC43 NOT DETECTED NOT DETECTED Final   Metapneumovirus NOT DETECTED NOT DETECTED Final   Rhinovirus / Enterovirus NOT DETECTED NOT DETECTED Final   Influenza A NOT DETECTED NOT DETECTED Final   Influenza B NOT DETECTED NOT DETECTED Final   Parainfluenza Virus 1 NOT DETECTED NOT DETECTED Final   Parainfluenza Virus 2 NOT DETECTED NOT DETECTED Final   Parainfluenza Virus 3 NOT DETECTED NOT DETECTED Final   Parainfluenza Virus 4 NOT DETECTED NOT DETECTED Final   Respiratory Syncytial Virus DETECTED (A) NOT DETECTED Final    Comment: CRITICAL RESULT CALLED TO, READ BACK BY AND VERIFIED WITH: Randal Buba RN 13:35 01/28/18 ([email or web address])    Bordetella pertussis NOT DETECTED NOT DETECTED Final   Chlamydophila pneumoniae NOT DETECTED NOT DETECTED Final   Mycoplasma pneumoniae NOT DETECTED NOT DETECTED Final    Comment: Performed at [facility], [address], [place], [place] [address]     Time coordinating discharge in minutes: 60  SIGNED:   Calvert Cantor, MD  [facility] 01/29/2018, 5:46  PM Pager   If 7PM-7AM, please contact night-coverage [email or web address] Password [email or web address]

## 2018-01-29 NOTE — Progress Notes (Signed)
  Echocardiogram 2D Echocardiogram has been performed.  [name] 01/29/2018, 8:45 AM

## 2018-01-29 NOTE — Progress Notes (Signed)
Occupational Therapy Evaluation Patient Details Name: [name] MRN: [id number] DOB: December 04, 1928 Today's Date: 01/29/2018    History of Present Illness [name] is an 85 y.o. female with history of hypertension, polymyalgia rheumatica, pernicious anemia presents to the ER with complaints of feeling weak with some shortness of breath, cough and flulike symptoms for the last 4 days.   Clinical Impression   PTA, pt lived at home alone and was independent in all ADL and IADL. Pt's two daughters and neighbors are close to assist pt PRN. Pt reported she feels the best she's felt since admission. Pt appears to be close to baseline and reports she feels strong enough to return home safely. No additional OT needs identified. Pt to d/c home with intermittent S from family and neighbor. OT will sign off at this time. Thank you for referral.      Follow Up Recommendations  No OT follow up;Supervision - Intermittent    Equipment Recommendations  None recommended by OT           Precautions / Restrictions Precautions Precautions: Fall Restrictions Weight Bearing Restrictions: No      Mobility Bed Mobility Overal bed mobility: Independent                Transfers Overall transfer level: Modified independent Equipment used: Straight cane                  Balance Overall balance assessment: Mild deficits observed, not formally tested                                         ADL either performed or assessed with clinical judgement   ADL Overall ADL's : At baseline                                       General ADL Comments: Pt appears to be close to baseline with completion of ADL and IADL. Pt required S for functional mobility. She reports she feels slightly weaker, but overall the "best she's felt since coming here".      Vision Baseline Vision/History: Wears glasses Patient Visual Report: No change from baseline        Perception     Praxis      Pertinent Vitals/Pain Pain Assessment: No/denies pain     Hand Dominance Right   Extremity/Trunk Assessment Upper Extremity Assessment Upper Extremity Assessment: Overall WFL for tasks assessed   Lower Extremity Assessment Lower Extremity Assessment: Defer to PT evaluation       Communication Communication Communication: HOH   Cognition Arousal/Alertness: Awake/alert Behavior During Therapy: WFL for tasks assessed/performed Overall Cognitive Status: Within Functional Limits for tasks assessed                                     General Comments  Pt's 2 daughters present during session. Both daughters and neighbor are available to help PRN. Pt reports she feels strong enough to return home safely.     Exercises     Shoulder Instructions      Home Living Family/patient expects to be discharged to:: Private residence Living Arrangements: Spouse/significant other Available Help at Discharge: Family;Available 24 hours/day Type of Home: House Home  Access: Stairs to enter Entergy CorporationEntrance Stairs-Number of Steps: 2 Entrance Stairs-Rails: Right;Left Home Layout: One level     Bathroom Shower/Tub: Chief Strategy OfficerTub/shower unit   Bathroom Toilet: Standard     Home Equipment: Environmental consultantWalker - 2 wheels;Bedside commode;Tub bench;Grab bars - tub/shower;Adaptive equipment Adaptive Equipment: Reacher;Sock aid Additional Comments: has been doing her own ADL's      Prior Functioning/Environment Level of Independence: Independent with assistive device(s)        Comments: cane outside the house        OT Problem List: Decreased activity tolerance      OT Treatment/Interventions:      OT Goals(Current goals can be found in the care plan section) Acute Rehab OT Goals Patient Stated Goal: go home and get back to "normal" OT Goal Formulation: With patient Time For Goal Achievement: 02/12/18 Potential to Achieve Goals: Good  OT Frequency:       AM-PAC OT "6 Clicks" Daily Activity     Outcome Measure Help from another person eating meals?: None Help from another person taking care of personal grooming?: None Help from another person toileting, which includes using toliet, bedpan, or urinal?: None Help from another person bathing (including washing, rinsing, drying)?: None Help from another person to put on and taking off regular upper body clothing?: None Help from another person to put on and taking off regular lower body clothing?: None 6 Click Score: 24   End of Session Equipment Utilized During Treatment: Gait belt Nurse Communication: Mobility status  Activity Tolerance: Patient tolerated treatment well Patient left: in bed;with family/visitor present  OT Visit Diagnosis: Other abnormalities of gait and mobility (R26.89)                Time: 1340-1400 OT Time Calculation (min): 20 min Charges:  OT General Charges $OT Visit: 1 Visit OT Evaluation $OT Eval Moderate Complexity: 1 Mod  [name], OT [employer] [facility] Services Pager [phone] Office [phone]   [name] PennerBreanna L Chyan [name] 01/29/2018, 2:53 PM

## 2018-02-05 NOTE — Progress Notes (Addendum)
HPI:  Using dictation device. Unfortunately this device frequently misinterprets words/phrases.  [name] is a pleasant 85 y.o. with a PMH significant for hypertension, pernicious anemia, pmr here for a hospital follow up. See transitional care phone note in Epic.  Due for tetanus booster, bmp, ?hctz, ? Cards referral Per review of discharge documents and patient: Hospitalized 1/6 to 01/29/18 Primary admitting complaint(s): sob, feeling weak, flu like symptoms Primary admitting diagnosis (es) and treatment: AKI, secondary to dehydration per notes --> IVFs, hctz held; ? A. Fib vs short runs of A. Tachy (Dr. Excell Seltzer consulted per notes) - cards consulted, echo normal EF, grade 1 dd, drinks and pt and family did not want anticoagulation; respiratory illness with RSV + treated with doxy Follow up concerns per discharge document: ? Restart hctz if needed, f/u ? A.tachy Reports today: doing much better, daughter reports still quite weak - denies sob, dyspnea, fevers, chest pain, palpitations They do not want to pursue further cardiac evaluation at this time. They confirm would not want to do any type of anticoagulation or blood thinner,    ROS: See pertinent positives and negatives per HPI.  Past Medical History:  Diagnosis Date  . Alcohol abuse   . Emphysema of lung (HCC) 01/26/2009   pt. unsure of this reports as of 07/31/2012  . Fracture of fifth metatarsal bone of right foot 08/29/2012  . Gout 08/11/2012  . Hyperlipemia 03/27/2007  . Hypertension   . Osteoporosis 07/12/2006  . Pernicious anemia 10/25/2006  . Polymyalgia rheumatica (HCC) 03/27/2007   sees Dr. Kellie Simmering in rheumatology  . Vitamin D deficiency 07/12/2006    Past Surgical History:  Procedure Laterality Date  . APPENDECTOMY  07/12/2006  . LUMBAR LAMINECTOMY/DECOMPRESSION MICRODISCECTOMY Right 02/13/2017   Procedure: Microdiscectomy - right - Lumbar three-Lumbar four;  Surgeon: Tia Alert, MD;  Location: [facility] OR;  Service:  Neurosurgery;  Laterality: Right;  . PARTIAL HIP ARTHROPLASTY     left side- 2x's ( 2nd surgery to correct 1st replacement), R side -   . TOOTH EXTRACTION N/A 08/04/2012   Procedure: EXTRACTIONS 17, 20;  Surgeon: Georgia Lopes, DDS;  Location: [facility] OR;  Service: Oral Surgery;  Laterality: N/A;    Family History  Problem Relation Age of Onset  . Mental illness Mother        alzheimer dementia  . Ulcers Father   . Heart disease Father   . Cancer Father        lung, smoker    SOCIAL HX: see hpi   Current Outpatient Medications:  .  alendronate (FOSAMAX) 70 MG tablet, TAKE 1 TABLET BY MOUTH ONCE A WEEK (Patient taking differently: Take 70 mg by mouth once a week. ), Disp: 12 tablet, Rfl: 0 .  Cyanocobalamin (VITAMIN B-12) 1000 MCG SUBL, Place 1,000 mcg under the tongue daily. , Disp: , Rfl:  .  diphenhydrAMINE (BENADRYL) 25 MG tablet, Take 25 mg by mouth at bedtime as needed for sleep., Disp: , Rfl:  .  doxycycline (VIBRA-TABS) 100 MG tablet, Take 1 tablet (100 mg total) by mouth every 12 (twelve) hours., Disp: 8 tablet, Rfl: 0 .  Oyster Shell (OYSTER CALCIUM) 500 MG TABS tablet, Take 1,000 mg of elemental calcium by mouth daily., Disp: , Rfl:  .  predniSONE (DELTASONE) 2.5 MG tablet, Take 1 tablet (2.5 mg total) by mouth daily with breakfast., Disp: 90 tablet, Rfl: 1 .  vitamin C (ASCORBIC ACID) 250 MG tablet, Take 500 mg by mouth daily., Disp: ,  Rfl:   EXAM:  Vitals:   02/06/18 1358  BP: (!) 122/50  Pulse: 76  Temp: 98 F (36.7 C)  SpO2: 92%    Body mass index is 19.69 kg/m.  GENERAL: vitals reviewed and listed above, alert, oriented, appears well hydrated and in no acute distress  HEENT: atraumatic, conjunttiva clear, no obvious abnormalities on inspection of external nose and ears  NECK: no obvious masses on inspection  LUNGS: clear to auscultation bilaterally, no wheezes, rales or rhonchi, good air movement  CV: HRRR, no peripheral edema  MS: moves all extremities  without noticeable abnormality  PSYCH: pleasant and cooperative, no obvious depression or anxiety  ASSESSMENT AND PLAN:  Discussed the following assessment and plan:  Acute renal failure   Influenza-like illness  POLYMYALGIA RHEUMATICA  Pernicious anemia  Essential hypertension  -seems to be doing well considering recent illness -O2 on my check 100% on room air -opted to not resume hctz - recheck in 1-2 months -HR normal today, they decline further heart evaluation or that she would ever use blood thinner -advised healthy diet, regular meals, hydration, gentle activity, fall precuations -recheck labs in 1-2 months -Patient advised to return or notify a doctor immediately if symptoms worsen or persist or new concerns arise.  Patient Instructions  Follow up in 1-2 months.  Healthy regular meals.  Plenty of water.  Stay hydrated, but avoid alcohol.  Stay active.  Use caution when standing and walking - use cane or walker.  Let us know if you want to do some physical therapy to help with strength.  Let us know if you want to see the cardiologist.  I hope you are feeling better soon! Seek care promptly if your symptoms worsen, new concerns arise or you are not improving with treatment.     Terressa Koyanagi, DO

## 2018-02-06 ENCOUNTER — Ambulatory Visit (INDEPENDENT_AMBULATORY_CARE_PROVIDER_SITE_OTHER): Payer: Medicare Other | Admitting: Family Medicine

## 2018-02-06 ENCOUNTER — Encounter: Payer: Self-pay | Admitting: Family Medicine

## 2018-02-06 VITALS — BP 122/50 | HR 76 | Temp 98.0°F | Wt 104.2 lb

## 2018-02-06 DIAGNOSIS — D510 Vitamin B12 deficiency anemia due to intrinsic factor deficiency: Secondary | ICD-10-CM

## 2018-02-06 DIAGNOSIS — R69 Illness, unspecified: Secondary | ICD-10-CM

## 2018-02-06 DIAGNOSIS — J111 Influenza due to unidentified influenza virus with other respiratory manifestations: Secondary | ICD-10-CM

## 2018-02-06 DIAGNOSIS — I10 Essential (primary) hypertension: Secondary | ICD-10-CM | POA: Diagnosis not present

## 2018-02-06 DIAGNOSIS — M353 Polymyalgia rheumatica: Secondary | ICD-10-CM | POA: Diagnosis not present

## 2018-02-06 DIAGNOSIS — N170 Acute kidney failure with tubular necrosis: Secondary | ICD-10-CM | POA: Diagnosis not present

## 2018-02-06 DIAGNOSIS — N179 Acute kidney failure, unspecified: Secondary | ICD-10-CM

## 2018-02-06 NOTE — Patient Instructions (Signed)
Follow up in 1-2 months.  Healthy regular meals.  Plenty of water.  Stay hydrated, but avoid alcohol.  Stay active.  Use caution when standing and walking - use cane or walker.  Let us know if you want to do some physical therapy to help with strength.  Let us know if you want to see the cardiologist.  I hope you are feeling better soon! Seek care promptly if your symptoms worsen, new concerns arise or you are not improving with treatment.

## 2018-03-12 ENCOUNTER — Telehealth: Payer: Self-pay | Admitting: Family Medicine

## 2018-03-12 DIAGNOSIS — Z0279 Encounter for issue of other medical certificate: Secondary | ICD-10-CM

## 2018-03-12 NOTE — Telephone Encounter (Signed)
Handicap Placard form to be filled out.  Placed in doctor's folder.  Call [phone] upon completion.

## 2018-03-13 NOTE — Telephone Encounter (Signed)
Completed. In [employer].

## 2018-03-13 NOTE — Telephone Encounter (Signed)
I left a detailed message at the pts home number the placard was completed and left at the front desk for pick up.  Copy sent to be scanned.

## 2018-03-14 NOTE — Telephone Encounter (Signed)
Pt picked up form and paid $29.00 for the completion of the form 03/13/2018

## 2018-03-18 ENCOUNTER — Ambulatory Visit: Payer: Medicare Other

## 2018-03-18 ENCOUNTER — Ambulatory Visit: Payer: Medicare Other | Admitting: Family Medicine

## 2018-03-20 ENCOUNTER — Ambulatory Visit (INDEPENDENT_AMBULATORY_CARE_PROVIDER_SITE_OTHER): Payer: Medicare Other

## 2018-03-20 ENCOUNTER — Ambulatory Visit (INDEPENDENT_AMBULATORY_CARE_PROVIDER_SITE_OTHER): Payer: Medicare Other | Admitting: Family Medicine

## 2018-03-20 ENCOUNTER — Encounter: Payer: Self-pay | Admitting: Family Medicine

## 2018-03-20 VITALS — BP 130/60 | HR 71 | Temp 97.7°F | Ht 61.0 in | Wt 107.8 lb

## 2018-03-20 DIAGNOSIS — I10 Essential (primary) hypertension: Secondary | ICD-10-CM | POA: Diagnosis not present

## 2018-03-20 DIAGNOSIS — Z0000 Encounter for general adult medical examination without abnormal findings: Secondary | ICD-10-CM

## 2018-03-20 DIAGNOSIS — N179 Acute kidney failure, unspecified: Secondary | ICD-10-CM

## 2018-03-20 MED ORDER — ALENDRONATE SODIUM 70 MG PO TABS: ORAL_TABLET | ORAL | 1 refills | Status: DC

## 2018-03-20 MED ORDER — PREDNISONE 2.5 MG PO TABS: 2.5000 mg | ORAL_TABLET | Freq: Every day | ORAL | 1 refills | Status: DC

## 2018-03-20 NOTE — Addendum Note (Signed)
Addended by: Johnella Moloney on: 03/20/2018 03:39 PM   Modules accepted: Orders

## 2018-03-20 NOTE — Patient Instructions (Signed)
AWV with Irving Burton  Follow up in 4-6 months

## 2018-03-20 NOTE — Progress Notes (Signed)
Subjective:   Shan LevansRebecca T Gaymon is a 85 y.o. female who presents for Medicare Annual (Subsequent) preventive examination.  Review of Systems:  No ROS.  Medicare Wellness Visit. Additional risk factors are reflected in the social history.  Cardiac Risk Factors include: advanced age (>2355men, 90>65 women);hypertension Sleep patterns: falls asleep easily, feels rested on waking and gets up 1 time nightly to void. Uses benadryl nightly, no issues.   Home Safety/Smoke Alarms: Feels safe in home. Smoke alarms in place.  Living environment; residence and Firearm Safety: 1-story house/ trailer, equipment: Cane, Type: Single DIRECTVPoint Cane and has tub bench, grab bars in bathroom . Seat Belt Safety/Bike Helmet: Wears seat belt.   Female:   Pap- N/A d/t age     Mammo- pt. declined      Dexa scan- 07/2014, pt. Stated she wanted to wait.       CCS- N/A d/t age      Objective:     Vitals: There were no vitals taken for this visit.  There is no height or weight on file to calculate BMI.  Advanced Directives 03/21/2018 01/27/2018 02/14/2017 02/13/2017 02/11/2017 08/30/2016 08/04/2012  Does Patient Have a Medical Advance Directive? Yes Yes Yes Yes Yes Yes -  Type of Estate agentAdvance Directive Healthcare Power of [place]orney;Living will Healthcare Power of [place]Attorney;Living will Healthcare Power of [place]Attorney;Living will Healthcare Power of [place]orney;Living will Healthcare Power of [place]orney;Living will - -  Does patient want to make changes to medical advance directive? No - Patient declined - No - Patient declined No - Patient declined No - Patient declined - -  Copy of Healthcare Power of Attorney in Chart? No - copy requested - No - copy requested No - copy requested No - copy requested - -  Pre-existing out of facility DNR order (yellow form or pink MOST form) - - - - - - No    Tobacco Social History   Tobacco Use  Smoking Status Former Smoker  . Packs/day: 2.00  . Years: 58.00  . Pack years: 116.00  . Start date:  01/23/1946  . Last attempt to quit: 07/12/2006  . Years since quitting: 11.6  Smokeless Tobacco Never Used     Counseling given: Not Answered   Past Medical History:  Diagnosis Date  . Alcohol abuse   . Emphysema of lung (HCC) 01/26/2009   pt. unsure of this reports as of 07/31/2012  . Fracture of fifth metatarsal bone of right foot 08/29/2012  . Gout 08/11/2012  . Hyperlipemia 03/27/2007  . Hypertension   . Osteoporosis 07/12/2006  . Pernicious anemia 10/25/2006  . Polymyalgia rheumatica (HCC) 03/27/2007   sees Dr. Kellie Simmeringruslow in rheumatology  . Vitamin D deficiency 07/12/2006   Past Surgical History:  Procedure Laterality Date  . APPENDECTOMY  07/12/2006  . LUMBAR LAMINECTOMY/DECOMPRESSION MICRODISCECTOMY Right 02/13/2017   Procedure: Microdiscectomy - right - Lumbar three-Lumbar four;  Surgeon: Tia AlertJones, David S, MD;  [facility];  Service: Neurosurgery;  Laterality: Right;  . PARTIAL HIP ARTHROPLASTY     left side- 2x's ( 2nd surgery to correct 1st replacement), R side -   . TOOTH EXTRACTION N/A 08/04/2012   Procedure: EXTRACTIONS 17, 20;  Surgeon: Georgia LopesScott M Jensen, DDS;  Location: [facility] OR;  Service: Oral Surgery;  Laterality: N/A;   Family History  Problem Relation Age of Onset  . Mental illness Mother        alzheimer dementia  . Ulcers Father   . Heart disease Father   .  Cancer Father        lung, smoker   Social History   Socioeconomic History  . Marital status: Married    Spouse name: Not on file  . Number of children: 2  . Years of education: Not on file  . Highest education level: Not on file  Occupational History  . Not on file  Social Needs  . Financial resource strain: Not hard at all  . Food insecurity:    Worry: Never true    Inability: Never true  . Transportation needs:    Medical: No    Non-medical: No  Tobacco Use  . Smoking status: Former Smoker    Packs/day: 2.00    Years: 58.00    Pack years: 116.00    Start date: 01/23/1948    Last attempt to quit:  07/12/2006    Years since quitting: 11.6  . Smokeless tobacco: Never Used  Substance and Sexual Activity  . Alcohol use: No    Alcohol/week: 10.0 standard drinks    Types: 10 Shots of liquor per week    Frequency: Never    Comment: quit completely - May 17, 2010  . Drug use: No  . Sexual activity: Never  Lifestyle  . Physical activity:    Days per week: 0 days    Minutes per session: 0 min  . Stress: Not at all  Relationships  . Social connections:    Talks on phone: Three times a week    Gets together: Twice a week    Attends religious service: More than 4 times per year    Active member of club or organization: No    Attends meetings of clubs or organizations: Never    Relationship status: Widowed  Other Topics Concern  . Not on file  Social History Narrative   Work or School: none      Home Situation: lives with husband - drives, husband takes care of finances, dresses herself, takes care of housework and cooking      Spiritual Beliefs: [employer]      Lifestyle: uses cane when goes out, exercises sometimes - diet healthy      03/20/2018: Lives alone in one-level townhouse with dog. Husband passed away in fall of 2017-05-16. Coping well overall.   Has two daughters, both of whom live close by and are supportive.   Active in church   Enjoys gardening          Outpatient Encounter Medications as of 03/20/2018  Medication Sig  . Cyanocobalamin (VITAMIN B-12) 1000 MCG SUBL Place 1,000 mcg under the tongue daily.   . diphenhydrAMINE (BENADRYL) 25 MG tablet Take 25 mg by mouth at bedtime as needed for sleep.  . [DISCONTINUED] alendronate (FOSAMAX) 70 MG tablet TAKE 1 TABLET BY MOUTH ONCE A WEEK (Patient taking differently: Take 70 mg by mouth once a week. )  . [DISCONTINUED] doxycycline (VIBRA-TABS) 100 MG tablet Take 1 tablet (100 mg total) by mouth every 12 (twelve) hours.  . [DISCONTINUED] predniSONE (DELTASONE) 2.5 MG tablet Take 1 tablet (2.5 mg total) by mouth daily with  breakfast.  . Oyster Shell (OYSTER CALCIUM) 500 MG TABS tablet Take 1,000 mg of elemental calcium by mouth daily.  . vitamin C (ASCORBIC ACID) 250 MG tablet Take 500 mg by mouth daily.   No facility-administered encounter medications on file as of 03/20/2018.     Activities of Daily Living In your present state of health, do you have any difficulty performing the following activities:  03/20/2018  Hearing? Y  Vision? N  Difficulty concentrating or making decisions? N  Walking or climbing stairs? N  Dressing or bathing? N  Doing errands, shopping? Y  Comment daughter accompanies  Quarry manager and eating ? N  Using the Toilet? N  In the past six months, have you accidently leaked urine? N  Do you have problems with loss of bowel control? N  Managing your Medications? N  Managing your Finances? N  Housekeeping or managing your Housekeeping? N  Some recent data might be hidden    Patient Care Team: Terressa Koyanagi, DO as PCP - General (Family Medicine) Antony Contras, MD as Consulting Physician (Ophthalmology)    Assessment:   This is a routine wellness examination for [place]. Physical assessment deferred to PCP.   Exercise Activities and Dietary recommendations Current Exercise Habits: Home exercise routine(pt does hip exercises she received from PT daily. Walks), Type of exercise: calisthenics;walking, Time (Minutes): 15, Frequency (Times/Week): 7, Weekly Exercise (Minutes/Week): 105, Intensity: Mild, Exercise limited by: orthopedic condition(s);cardiac condition(s) Diet (meal preparation, eat out, water intake, caffeinated beverages, dairy products, fruits and vegetables): in general, a "healthy" diet  . Eats fruit and vegetables, good appetite. States she drinks water daily.       Goals    . Patient Stated     Maintain independence, keep away from sick people!        Fall Risk Fall Risk  03/20/2018 08/30/2016 08/13/2016 03/19/2014 09/08/2012  Falls in the past year? 0 No No No  No  Comment - - Emmi Telephone Survey: data to providers prior to load - -  Risk for fall due to : History of fall(s);Impaired vision;Impaired mobility - - - -  Follow up Falls prevention discussed;Education provided - - - -    Depression Screen PHQ 2/9 Scores 03/20/2018 08/30/2016 03/19/2014 09/08/2012  PHQ - 2 Score 1 0 0 0  PHQ- 9 Score 1 - - -    Pt. states she has been handling death of husband pretty well. "When I get down, I just tell myself 'he's gone. You have to move on'". Daughters and granddaughter provide lots of support.   Cognitive Function MMSE - Mini Mental State Exam 08/30/2016  Orientation to time 5  Orientation to Place 5  Registration 3  Attention/ Calculation 5  Recall 2  Language- name 2 objects 2  Language- repeat 1  Language- follow 3 step command 3  Language- read & follow direction 1  Write a sentence 1  Copy design 0  Total score 28   Ad8 score reviewed for issues:  Issues making decisions: no  Less interest in hobbies / activities: no  Repeats questions, stories (family complaining): no  Trouble using ordinary gadgets (microwave, computer, phone):no  Forgets the month or year: no  Mismanaging finances: no  Remembering appts: no  Daily problems with thinking and/or memory: no Ad8 score is= 0       Immunization History  Administered Date(s) Administered  . Influenza Split 10/24/2010, 10/04/2011  . Influenza Whole 12/18/2006, 11/12/2007, 10/19/2008, 10/25/2009  . Influenza, High Dose Seasonal PF 10/12/2014, 10/10/2015, 10/25/2016, 11/25/2017  . Influenza,inj,Quad PF,6+ Mos 10/02/2012, 10/21/2013  . Pneumococcal Conjugate-13 07/20/2014  . Pneumococcal Polysaccharide-23 11/23/1994, 09/08/2012  . Td 06/22/1997, 10/15/2007    Screening Tests Health Maintenance  Topic Date Due  . TETANUS/TDAP  03/21/2019 (Originally 10/14/2017)  . INFLUENZA VACCINE  Completed  . PNA vac Low Risk Adult  Completed  . DEXA SCAN  Addressed  Plan:     Bring a copy of your living will and/or healthcare power of attorney to your next office visit.  Avoid falls, and sick people! Fall prevention and hygiene tips provided.  I have personally reviewed and noted the following in the patient's chart:   . Medical and social history . Use of alcohol, tobacco or illicit drugs  . Current medications and supplements . Functional ability and status . Nutritional status . Physical activity . Advanced directives . List of other physicians . Hospitalizations, surgeries, and ER visits in previous 12 months . Vitals . Screenings to include cognitive, depression, and falls . Referrals and appointments  In addition, I have reviewed and discussed with patient certain preventive protocols, quality metrics, and best practice recommendations. A written personalized care plan for preventive services as well as general preventive health recommendations were provided to patient.     Brayton Layman, RN  03/21/2018

## 2018-03-20 NOTE — Progress Notes (Signed)
HPI:  Using dictation device. Unfortunately this device frequently misinterprets words/phrases.  [name] is a pleasant 85 year old here for follow-up of hypertension.  Blood pressure medication was held in January during a hospital admission with acute renal failure.  Today reports doing well. Recovering from loss of spouse. Eating better. No heart symptoms.  ROS: See pertinent positives and negatives per HPI.  Past Medical History:  Diagnosis Date  . Alcohol abuse   . Emphysema of lung (HCC) 01/26/2009   pt. unsure of this reports as of 07/31/2012  . Fracture of fifth metatarsal bone of right foot 08/29/2012  . Gout 08/11/2012  . Hyperlipemia 03/27/2007  . Hypertension   . Osteoporosis 07/12/2006  . Pernicious anemia 10/25/2006  . Polymyalgia rheumatica (HCC) 03/27/2007   sees Dr. Kellie Simmering in rheumatology  . Vitamin D deficiency 07/12/2006    Past Surgical History:  Procedure Laterality Date  . APPENDECTOMY  07/12/2006  . LUMBAR LAMINECTOMY/DECOMPRESSION MICRODISCECTOMY Right 02/13/2017   Procedure: Microdiscectomy - right - Lumbar three-Lumbar four;  Surgeon: Tia Alert, MD;  Location: [facility] OR;  Service: Neurosurgery;  Laterality: Right;  . PARTIAL HIP ARTHROPLASTY     left side- 2x's ( 2nd surgery to correct 1st replacement), R side -   . TOOTH EXTRACTION N/A 08/04/2012   Procedure: EXTRACTIONS 17, 20;  Surgeon: Georgia Lopes, DDS;  Location: [facility] OR;  Service: Oral Surgery;  Laterality: N/A;    Family History  Problem Relation Age of Onset  . Mental illness Mother        alzheimer dementia  . Ulcers Father   . Heart disease Father   . Cancer Father        lung, smoker    SOCIAL HX: se ehpi   Current Outpatient Medications:  .  alendronate (FOSAMAX) 70 MG tablet, TAKE 1 TABLET BY MOUTH ONCE A WEEK (Patient taking differently: Take 70 mg by mouth once a week. ), Disp: 12 tablet, Rfl: 0 .  Cyanocobalamin (VITAMIN B-12) 1000 MCG SUBL, Place 1,000 mcg under the tongue  daily. , Disp: , Rfl:  .  diphenhydrAMINE (BENADRYL) 25 MG tablet, Take 25 mg by mouth at bedtime as needed for sleep., Disp: , Rfl:  .  doxycycline (VIBRA-TABS) 100 MG tablet, Take 1 tablet (100 mg total) by mouth every 12 (twelve) hours., Disp: 8 tablet, Rfl: 0 .  Oyster Shell (OYSTER CALCIUM) 500 MG TABS tablet, Take 1,000 mg of elemental calcium by mouth daily., Disp: , Rfl:  .  predniSONE (DELTASONE) 2.5 MG tablet, Take 1 tablet (2.5 mg total) by mouth daily with breakfast., Disp: 90 tablet, Rfl: 1 .  vitamin C (ASCORBIC ACID) 250 MG tablet, Take 500 mg by mouth daily., Disp: , Rfl:   EXAM:  Vitals:   03/20/18 1437  BP: 130/60  Pulse: 71  Temp: 97.7 F (36.5 C)    Body mass index is 20.37 kg/m.  GENERAL: vitals reviewed and listed above, alert, oriented, appears well hydrated and in no acute distress  HEENT: atraumatic, conjunttiva clear, no obvious abnormalities on inspection of external nose and ears  NECK: no obvious masses on inspection  LUNGS: clear to auscultation bilaterally, no wheezes, rales or rhonchi, good air movement  CV: HRRR, no peripheral edema  MS: moves all extremities without noticeable abnormality  PSYCH: pleasant and cooperative, no obvious depression or anxiety  ASSESSMENT AND PLAN:  Discussed the following assessment and plan:  Acute renal failure, unspecified acute renal failure type (HCC)  Essential hypertension  -  advised BMP for kidney recheck, declined -she has AWV with Irving Burton today -follow up 4-6 months and as needed   Patient Instructions  AWV with Irving Burton  Follow up in 4-6 months   Terressa Koyanagi, DO

## 2018-03-20 NOTE — Patient Instructions (Addendum)
Bring a copy of your living will and/or healthcare power of attorney to your next office visit.   Ms. Hageman , Thank you for taking time to come for your Medicare Wellness Visit. I appreciate your ongoing commitment to your health goals. Please review the following plan we discussed and let me know if I can assist you in the future.   These are the goals we discussed: Goals    . Patient Stated     Maintain independence, keep away from sick people!        This is a list of the screening recommended for you and due dates:  Health Maintenance  Topic Date Due  . Tetanus Vaccine  03/21/2019*  . Flu Shot  Completed  . Pneumonia vaccines  Completed  . DEXA scan (bone density measurement)  Addressed  *Topic was postponed. The date shown is not the original due date.     Fall Prevention in the Home, Adult Falls can cause injuries. They can happen to people of all ages. There are many things you can do to make your home safe and to help prevent falls. Ask for help when making these changes, if needed. What actions can I take to prevent falls? General Instructions  Use good lighting in all rooms. Replace any light bulbs that burn out.  Turn on the lights when you go into a dark area. Use night-lights.  Keep items that you use often in easy-to-reach places. Lower the shelves around your home if necessary.  Set up your furniture so you have a clear path. Avoid moving your furniture around.  Do not have throw rugs and other things on the floor that can make you trip.  Avoid walking on wet floors.  If any of your floors are uneven, fix them.  Add color or contrast paint or tape to clearly mark and help you see: ? Any grab bars or handrails. ? First and last steps of stairways. ? Where the edge of each step is.  If you use a stepladder: ? Make sure that it is fully opened. Do not climb a closed stepladder. ? Make sure that both sides of the stepladder are locked into  place. ? Ask someone to hold the stepladder for you while you use it.  If there are any pets around you, be aware of where they are. What can I do in the bathroom?      Keep the floor dry. Clean up any water that spills onto the floor as soon as it happens.  Remove soap buildup in the tub or shower regularly.  Use non-skid mats or decals on the floor of the tub or shower.  Attach bath mats securely with double-sided, non-slip rug tape.  If you need to sit down in the shower, use a plastic, non-slip stool.  Install grab bars by the toilet and in the tub and shower. Do not use towel bars as grab bars. What can I do in the bedroom?  Make sure that you have a light by your bed that is easy to reach.  Do not use any sheets or blankets that are too big for your bed. They should not hang down onto the floor.  Have a firm chair that has side arms. You can use this for support while you get dressed. What can I do in the kitchen?  Clean up any spills right away.  If you need to reach something above you, use a strong step stool that has  a grab bar.  Keep electrical cords out of the way.  Do not use floor polish or wax that makes floors slippery. If you must use wax, use non-skid floor wax. What can I do with my stairs?  Do not leave any items on the stairs.  Make sure that you have a light switch at the top of the stairs and the bottom of the stairs. If you do not have them, ask someone to add them for you.  Make sure that there are handrails on both sides of the stairs, and use them. Fix handrails that are broken or loose. Make sure that handrails are as long as the stairways.  Install non-slip stair treads on all stairs in your home.  Avoid having throw rugs at the top or bottom of the stairs. If you do have throw rugs, attach them to the floor with carpet tape.  Choose a carpet that does not hide the edge of the steps on the stairway.  Check any carpeting to make sure that  it is firmly attached to the stairs. Fix any carpet that is loose or worn. What can I do on the outside of my home?  Use bright outdoor lighting.  Regularly fix the edges of walkways and driveways and fix any cracks.  Remove anything that might make you trip as you walk through a door, such as a raised step or threshold.  Trim any bushes or trees on the path to your home.  Regularly check to see if handrails are loose or broken. Make sure that both sides of any steps have handrails.  Install guardrails along the edges of any raised decks and porches.  Clear walking paths of anything that might make someone trip, such as tools or rocks.  Have any leaves, snow, or ice cleared regularly.  Use sand or salt on walking paths during winter.  Clean up any spills in your garage right away. This includes grease or oil spills. What other actions can I take?  Wear shoes that: ? Have a low heel. Do not wear high heels. ? Have rubber bottoms. ? Are comfortable and fit you well. ? Are closed at the toe. Do not wear open-toe sandals.  Use tools that help you move around (mobility aids) if they are needed. These include: ? Canes. ? Walkers. ? Scooters. ? Crutches.  Review your medicines with your doctor. Some medicines can make you feel dizzy. This can increase your chance of falling. Ask your doctor what other things you can do to help prevent falls. Where to find more information  Centers for Disease Control and Prevention, STEADI: HealthcareCounselor.com.pt  [employer] on Aging: RingConnections.si Contact a doctor if:  You are afraid of falling at home.  You feel weak, drowsy, or dizzy at home.  You fall at home. Summary  There are many simple things that you can do to make your home safe and to help prevent falls.  Ways to make your home safe include removing tripping hazards and installing grab bars in the bathroom.  Ask for help when making these changes in your  home. This information is not intended to replace advice given to you by your health care provider. Make sure you discuss any questions you have with your health care provider. Document Released: 11/04/2008 Document Revised: 08/23/2016 Document Reviewed: 08/23/2016 Elsevier Interactive Patient Education  2019 [employer].

## 2018-06-26 DIAGNOSIS — H5202 Hypermetropia, left eye: Secondary | ICD-10-CM | POA: Diagnosis not present

## 2018-06-26 DIAGNOSIS — Z961 Presence of intraocular lens: Secondary | ICD-10-CM | POA: Diagnosis not present

## 2018-06-26 DIAGNOSIS — H35373 Puckering of macula, bilateral: Secondary | ICD-10-CM | POA: Diagnosis not present

## 2018-08-14 ENCOUNTER — Ambulatory Visit (INDEPENDENT_AMBULATORY_CARE_PROVIDER_SITE_OTHER): Payer: Medicare Other | Admitting: Family Medicine

## 2018-08-14 ENCOUNTER — Encounter: Payer: Self-pay | Admitting: Family Medicine

## 2018-08-14 ENCOUNTER — Other Ambulatory Visit: Payer: Self-pay

## 2018-08-14 ENCOUNTER — Telehealth: Payer: Self-pay | Admitting: *Deleted

## 2018-08-14 DIAGNOSIS — T148XXA Other injury of unspecified body region, initial encounter: Secondary | ICD-10-CM | POA: Diagnosis not present

## 2018-08-14 DIAGNOSIS — N189 Chronic kidney disease, unspecified: Secondary | ICD-10-CM

## 2018-08-14 DIAGNOSIS — L03115 Cellulitis of right lower limb: Secondary | ICD-10-CM

## 2018-08-14 MED ORDER — CEPHALEXIN 500 MG PO CAPS: 500.0000 mg | ORAL_CAPSULE | Freq: Two times a day (BID) | ORAL | 0 refills | Status: DC

## 2018-08-14 NOTE — Progress Notes (Addendum)
Virtual Visit via Video Note  I connected with [name]  on 08/14/18 at 10:40 AM EDT by a video enabled telemedicine application and verified that I am speaking with the correct person using two identifiers.  Location patient: home Location provider:work or home office Persons participating in the virtual visit: patient, provider, patient's daughter  I discussed the limitations of evaluation and management by telemedicine and the availability of in person appointments. The patient expressed understanding and agreed to proceed.   HPI:  Acute visit for a leg wound: -she bumped her R leg on a side table July 11th. -shallow wound and they cleaned and have been treating with a topical abx -recently has developed some erythema and swelling of the skin around the wound -denies purulent discharge, fevers, malaise, weakness, rapid worsening -(incidentally scraped her R leg as well on a box edge recently - but it seems to be healing well) -prefers to avoid OV given COVID19 pandemic and age  ROS: See pertinent positives and negatives per HPI.  Past Medical History:  Diagnosis Date  . Alcohol abuse   . Emphysema of lung ([place]) 01/26/2009   pt. unsure of this reports as of 07/31/2012  . Fracture of fifth metatarsal bone of right foot 08/29/2012  . Gout 08/11/2012  . Hyperlipemia 03/27/2007  . Hypertension   . Osteoporosis 07/12/2006  . Pernicious anemia 10/25/2006  . Polymyalgia rheumatica ([place]) 03/27/2007   sees Dr. Charlestine Night in rheumatology  . Vitamin D deficiency 07/12/2006    Past Surgical History:  Procedure Laterality Date  . APPENDECTOMY  07/12/2006  . LUMBAR LAMINECTOMY/DECOMPRESSION MICRODISCECTOMY Right 02/13/2017   Procedure: Microdiscectomy - right - Lumbar three-Lumbar four;  Surgeon: Eustace Moore, MD;  Location: [facility];  Service: Neurosurgery;  Laterality: Right;  . PARTIAL HIP ARTHROPLASTY     left side- 2x's ( 2nd surgery to correct 1st replacement), R side -   . TOOTH EXTRACTION N/A  08/04/2012   Procedure: EXTRACTIONS 17, 20;  Surgeon: Gae Bon, DDS;  Location: [facility];  Service: Oral Surgery;  Laterality: N/A;    Family History  Problem Relation Age of Onset  . Mental illness Mother        alzheimer dementia  . Ulcers Father   . Heart disease Father   . Cancer Father        lung, smoker    SOCIAL HX: see hpi   Current Outpatient Medications:  .  alendronate (FOSAMAX) 70 MG tablet, TAKE 1 TABLET BY MOUTH ONCE A WEEK, Disp: 12 tablet, Rfl: 1 .  Cyanocobalamin (VITAMIN B-12) 1000 MCG SUBL, Place 1,000 mcg under the tongue daily. , Disp: , Rfl:  .  diphenhydrAMINE (BENADRYL) 25 MG tablet, Take 25 mg by mouth at bedtime as needed for sleep., Disp: , Rfl:  .  Oyster Shell (OYSTER CALCIUM) 500 MG TABS tablet, Take 1,000 mg of elemental calcium by mouth daily., Disp: , Rfl:  .  predniSONE (DELTASONE) 2.5 MG tablet, Take 1 tablet (2.5 mg total) by mouth daily with breakfast., Disp: 90 tablet, Rfl: 1 .  vitamin C (ASCORBIC ACID) 250 MG tablet, Take 500 mg by mouth daily., Disp: , Rfl:  .  cephALEXin (KEFLEX) 500 MG capsule, Take 1 capsule (500 mg total) by mouth 2 (two) times daily., Disp: 10 capsule, Rfl: 0  EXAM:  VITALS per patient if applicable:  GENERAL: alert, oriented, appears well and in no acute distress  HEENT: atraumatic, conjunttiva clear, no obvious abnormalities on inspection of external nose  and ears  NECK: normal movements of the head and neck  LUNGS: on inspection no signs of respiratory distress, breathing rate appears normal, no obvious gross SOB, gasping or wheezing  CV: no obvious cyanosis, mild swelling bilat LE  SKIN: on R lower leg there is a shallow wound with mild amount of erythema and edema around the wound; on the R leg there is a clean healing 2-3 cm small lac or excoriation without any signs of drainage, edema or swelling  MS: moves all visible extremities without noticeable abnormality  PSYCH/NEURO: pleasant and cooperative,  no obvious depression or anxiety, speech and thought processing grossly intact  ASSESSMENT AND PLAN:  Discussed the following assessment and plan:  Open wound of skin   Cellulitis of right lower extremity  Chronic kidney disease, unspecified CKD stage  -abx: keflex 50mg  bid, renally dosed -elevation -close observation and follow up -follow up next Tues or Thursday, sooner if any worsening or concerns   I discussed the assessment and treatment plan with the patient. The patient was provided an opportunity to ask questions and all were answered. The patient agreed with the plan and demonstrated an understanding of the instructions.   The patient was advised to call back or seek an in-person evaluation if the symptoms worsen or if the condition fails to improve as anticipated.   Follow up instructions: Advised assistant Ronnald CollumJo Anne to help patient arrange the following: -follow up with Dr. Russella DarKim Tues or Alejandro Mullinghurs   [name] Loberg R Chalon Zobrist, DO   Patient Instructions  -Start the Kelflex today and follow the instructions  -elevated leg 30 minutes twice daily  -follow up next Tuesday or Thursday, if someone from my office does not call you in the next 2-3 days to arrange, please contact our office.

## 2018-08-14 NOTE — Telephone Encounter (Signed)
-----   Message from Lucretia Kern, DO sent at 08/14/2018  2:58 PM EDT ----- -follow up with Dr. Maudie Mercury next Tues or Thurs

## 2018-08-14 NOTE — Patient Instructions (Signed)
-  Start the Kelflex today and follow the instructions  -elevated leg 30 minutes twice daily  -follow up next Tuesday or Thursday, if someone from my office does not call you in the next 2-3 days to arrange, please contact our office.

## 2018-08-14 NOTE — Telephone Encounter (Signed)
Appt scheduled for 7/30

## 2018-08-21 ENCOUNTER — Telehealth: Payer: Self-pay | Admitting: *Deleted

## 2018-08-21 ENCOUNTER — Other Ambulatory Visit: Payer: Self-pay

## 2018-08-21 ENCOUNTER — Encounter: Payer: Self-pay | Admitting: *Deleted

## 2018-08-21 ENCOUNTER — Encounter: Payer: Self-pay | Admitting: Family Medicine

## 2018-08-21 ENCOUNTER — Ambulatory Visit (INDEPENDENT_AMBULATORY_CARE_PROVIDER_SITE_OTHER): Payer: Medicare Other | Admitting: Family Medicine

## 2018-08-21 DIAGNOSIS — L03115 Cellulitis of right lower limb: Secondary | ICD-10-CM | POA: Diagnosis not present

## 2018-08-21 MED ORDER — DOXYCYCLINE HYCLATE 100 MG PO TABS: 100.0000 mg | ORAL_TABLET | Freq: Two times a day (BID) | ORAL | 0 refills | Status: DC

## 2018-08-21 NOTE — Telephone Encounter (Signed)
Copied from [place] [id number]. Topic: General - Other >> Aug 21, 2018  1:59 PM Leward Quan A wrote: Reason for CRM: Patient daughter called to ask that Rx for doxycycline (VIBRA-TABS) 100 MG tablet that was sent to [facility] Rx be resent to [place], [place] [phone] (Phone) [phone] (Fax)

## 2018-08-21 NOTE — Progress Notes (Signed)
Virtual Visit via Video Note  I connected with [name]  on 08/21/18 at  1:00 PM EDT by a video enabled telemedicine application and verified that I am speaking with the correct person using two identifiers.  Location patient: home Location provider:work or home office Persons participating in the virtual visit: patient, provider, patient's daughter  I discussed the limitations of evaluation and management by telemedicine and the availability of in person appointments. The patient expressed understanding and agreed to proceed.   HPI:  Follow up R leg wound and cellulitis: -finish course of keflex -they think it looks a little better -still with some edema, erythema around the wound -no discharge, fevers, malaise or sig worsening -see prior note, reviewed -she does not want to go to wound clinic -hx poor wound healing on LE, poor venous flow and some edema at baseline  ROS: See pertinent positives and negatives per HPI.  Past Medical History:  Diagnosis Date  . Alcohol abuse   . Emphysema of lung (HCC) 01/26/2009   pt. unsure of this reports as of 07/31/2012  . Fracture of fifth metatarsal bone of right foot 08/29/2012  . Gout 08/11/2012  . Hyperlipemia 03/27/2007  . Hypertension   . Osteoporosis 07/12/2006  . Pernicious anemia 10/25/2006  . Polymyalgia rheumatica (HCC) 03/27/2007   sees Dr. Kellie Simmeringruslow in rheumatology  . Vitamin D deficiency 07/12/2006    Past Surgical History:  Procedure Laterality Date  . APPENDECTOMY  07/12/2006  . LUMBAR LAMINECTOMY/DECOMPRESSION MICRODISCECTOMY Right 02/13/2017   Procedure: Microdiscectomy - right - Lumbar three-Lumbar four;  Surgeon: Tia AlertJones, David S, MD;  [facility];  Service: Neurosurgery;  Laterality: Right;  . PARTIAL HIP ARTHROPLASTY     left side- 2x's ( 2nd surgery to correct 1st replacement), R side -   . TOOTH EXTRACTION N/A 08/04/2012   Procedure: EXTRACTIONS 17, 20;  Surgeon: Georgia LopesScott M Jensen, DDS;  Location: [facility] OR;  Service: Oral  Surgery;  Laterality: N/A;    Family History  Problem Relation Age of Onset  . Mental illness Mother        alzheimer dementia  . Ulcers Father   . Heart disease Father   . Cancer Father        lung, smoker    SOCIAL HX: see hpi   Current Outpatient Medications:  .  alendronate (FOSAMAX) 70 MG tablet, TAKE 1 TABLET BY MOUTH ONCE A WEEK, Disp: 12 tablet, Rfl: 1 .  Cyanocobalamin (VITAMIN B-12) 1000 MCG SUBL, Place 1,000 mcg under the tongue daily. , Disp: , Rfl:  .  diphenhydrAMINE (BENADRYL) 25 MG tablet, Take 25 mg by mouth at bedtime as needed for sleep., Disp: , Rfl:  .  Oyster Shell (OYSTER CALCIUM) 500 MG TABS tablet, Take 1,000 mg of elemental calcium by mouth daily., Disp: , Rfl:  .  predniSONE (DELTASONE) 2.5 MG tablet, Take 1 tablet (2.5 mg total) by mouth daily with breakfast., Disp: 90 tablet, Rfl: 1 .  vitamin C (ASCORBIC ACID) 250 MG tablet, Take 500 mg by mouth daily., Disp: , Rfl:  .  doxycycline (VIBRA-TABS) 100 MG tablet, Take 1 tablet (100 mg total) by mouth 2 (two) times daily., Disp: 10 tablet, Rfl: 0  EXAM:  VITALS per patient if applicable:  GENERAL: alert, oriented, appears well and in no acute distress  HEENT: atraumatic, conjunttiva clear, no obvious abnormalities on inspection of external nose and ears  NECK: normal movements of the head and neck  LUNGS: on inspection no signs of respiratory  distress, breathing rate appears normal, no obvious gross SOB, gasping or wheezing  CV: no obvious cyanosis  MS: moves all visible extremities without noticeable abnormality  PSYCH/NEURO: pleasant and cooperative, no obvious depression or anxiety, speech and thought processing grossly intact  SKIN: ~3 cm shallow appearing wound R shin with some surrounding erythema and edema  ASSESSMENT AND PLAN:  Discussed the following assessment and plan:  Cellulitis of right lower extremity  -discussed potential complications, options for further evaluation and  treatment, risks and precuations -they opted for empiric treatment with a different abx for cellulitis, elevation, moist heat and good wound care -doxy 100mg  bid x 5 days after discussion risks and interactions -close follow up next week, soonerif any worsening.   I discussed the assessment and treatment plan with the patient. The patient was provided an opportunity to ask questions and all were answered. The patient agreed with the plan and demonstrated an understanding of the instructions.   The patient was advised to call back or seek an in-person evaluation if the symptoms worsen or if the condition fails to improve as anticipated.   Follow up instructions: Advised assistant Wendie Simmer to help patient arrange the following: -followup next week with Dr. Dimple Casey, DO   Patient Instructions  -take the doxycycline as prescribed  -avoid tylenol, alcohol and sun while on this medication  I hope you are feeling better soon! Seek care promptly if your symptoms worsen, new concerns arise or you are not improving with treatment.  Follow up next week

## 2018-08-21 NOTE — Telephone Encounter (Signed)
Appt scheduled for 8/6.

## 2018-08-21 NOTE — Patient Instructions (Signed)
-  take the doxycycline as prescribed  -avoid tylenol, alcohol and sun while on this medication  I hope you are feeling better soon! Seek care promptly if your symptoms worsen, new concerns arise or you are not improving with treatment.  Follow up next week

## 2018-08-21 NOTE — Telephone Encounter (Signed)
Rx sent to [facility]

## 2018-08-21 NOTE — Telephone Encounter (Signed)
-----   Message from Lucretia Kern, DO sent at 08/21/2018  2:17 PM EDT ----- -follow up next week with Dr. Maudie Mercury

## 2018-08-28 ENCOUNTER — Telehealth: Payer: Medicare Other | Admitting: Family Medicine

## 2018-08-28 ENCOUNTER — Other Ambulatory Visit: Payer: Self-pay

## 2018-08-28 ENCOUNTER — Telehealth (INDEPENDENT_AMBULATORY_CARE_PROVIDER_SITE_OTHER): Payer: Medicare Other | Admitting: Family Medicine

## 2018-08-28 DIAGNOSIS — T148XXA Other injury of unspecified body region, initial encounter: Secondary | ICD-10-CM

## 2018-08-28 NOTE — Progress Notes (Signed)
Virtual Visit via Video Note  I connected with [name]  on 08/28/18 at  6:20 PM EDT by a video enabled telemedicine application and verified that I am speaking with the correct person using two identifiers.  Location patient: home Location provider:work or home office Persons participating in the virtual visit: patient, patient's daughter, provider   HPI:  Follow up leg wound/cellulitis: -doing better per daughter -no longer any redness around the wound or discharge -now has completed 10 days of doxy -no fevers, malaise -still elevating   ROS: See pertinent positives and negatives per HPI.  Past Medical History:  Diagnosis Date  . Alcohol abuse   . Emphysema of lung ([place]) 01/26/2009   pt. unsure of this reports as of 07/31/2012  . Fracture of fifth metatarsal bone of right foot 08/29/2012  . Gout 08/11/2012  . Hyperlipemia 03/27/2007  . Hypertension   . Osteoporosis 07/12/2006  . Pernicious anemia 10/25/2006  . Polymyalgia rheumatica ([place]) 03/27/2007   sees Dr. Charlestine Night in rheumatology  . Vitamin D deficiency 07/12/2006    Past Surgical History:  Procedure Laterality Date  . APPENDECTOMY  07/12/2006  . LUMBAR LAMINECTOMY/DECOMPRESSION MICRODISCECTOMY Right 02/13/2017   Procedure: Microdiscectomy - right - Lumbar three-Lumbar four;  Surgeon: Eustace Moore, MD;  Location: [place];  Service: Neurosurgery;  Laterality: Right;  . PARTIAL HIP ARTHROPLASTY     left side- 2x's ( 2nd surgery to correct 1st replacement), R side -   . TOOTH EXTRACTION N/A 08/04/2012   Procedure: EXTRACTIONS 17, 20;  Surgeon: Gae Bon, DDS;  Location: [place];  Service: Oral Surgery;  Laterality: N/A;    Family History  Problem Relation Age of Onset  . Mental illness Mother        alzheimer dementia  . Ulcers Father   . Heart disease Father   . Cancer Father        lung, smoker    SOCIAL HX: see hpi   Current Outpatient Medications:  .  alendronate (FOSAMAX) 70 MG tablet, TAKE 1 TABLET BY MOUTH  ONCE A WEEK, Disp: 12 tablet, Rfl: 1 .  Cyanocobalamin (VITAMIN B-12) 1000 MCG SUBL, Place 1,000 mcg under the tongue daily. , Disp: , Rfl:  .  diphenhydrAMINE (BENADRYL) 25 MG tablet, Take 25 mg by mouth at bedtime as needed for sleep., Disp: , Rfl:  .  doxycycline (VIBRA-TABS) 100 MG tablet, Take 1 tablet (100 mg total) by mouth 2 (two) times daily., Disp: 10 tablet, Rfl: 0 .  Oyster Shell (OYSTER CALCIUM) 500 MG TABS tablet, Take 1,000 mg of elemental calcium by mouth daily., Disp: , Rfl:  .  predniSONE (DELTASONE) 2.5 MG tablet, Take 1 tablet (2.5 mg total) by mouth daily with breakfast., Disp: 90 tablet, Rfl: 1 .  vitamin C (ASCORBIC ACID) 250 MG tablet, Take 500 mg by mouth daily., Disp: , Rfl:   EXAM:  VITALS per patient if applicable:  GENERAL: alert, oriented, appears well and in no acute distress  HEENT: atraumatic, conjunttiva clear, no obvious abnormalities on inspection of external nose and ears  NECK: normal movements of the head and neck  LUNGS: on inspection no signs of respiratory distress, breathing rate appears normal, no obvious gross SOB, gasping or wheezing  CV: no obvious cyanosis  SKIN: 2-4 cm shallow healing wound R shin, good granulation tissue, no surrounding erythema, daughter denies any surrounding warmth or discharge  MS: moves all visible extremities without noticeable abnormality  PSYCH/NEURO: pleasant and cooperative, no obvious depression  or anxiety, speech and thought processing grossly intact  ASSESSMENT AND PLAN:  Discussed the following assessment and plan:  Open wound of skin   Appear to be healing well at this visit. No signs of persistent infection. Continue good wound care, elevation for edema. Advised seeking medical care promptly if worsening, redness/pain/swelling recurs, does not continue to heal and resolve or other concerns.     Terressa KoyanagiHannah R Julie Nay, DO

## 2018-09-18 ENCOUNTER — Telehealth (INDEPENDENT_AMBULATORY_CARE_PROVIDER_SITE_OTHER): Payer: Medicare Other | Admitting: Family Medicine

## 2018-09-18 ENCOUNTER — Other Ambulatory Visit: Payer: Self-pay

## 2018-09-18 ENCOUNTER — Telehealth: Payer: Self-pay | Admitting: Family Medicine

## 2018-09-18 DIAGNOSIS — M810 Age-related osteoporosis without current pathological fracture: Secondary | ICD-10-CM

## 2018-09-18 DIAGNOSIS — I10 Essential (primary) hypertension: Secondary | ICD-10-CM

## 2018-09-18 DIAGNOSIS — S81809D Unspecified open wound, unspecified lower leg, subsequent encounter: Secondary | ICD-10-CM

## 2018-09-18 DIAGNOSIS — Z87898 Personal history of other specified conditions: Secondary | ICD-10-CM | POA: Diagnosis not present

## 2018-09-18 DIAGNOSIS — L03119 Cellulitis of unspecified part of limb: Secondary | ICD-10-CM

## 2018-09-18 DIAGNOSIS — M353 Polymyalgia rheumatica: Secondary | ICD-10-CM

## 2018-09-18 NOTE — Patient Instructions (Signed)
I sent a message to my referral coordinator to help arrange a wound clinic visit.  I also sent a message to the scheduler to assist with setting up and in person visit in our office the first week of September about the weakness, to get a flu shot and to get labs. I placed the order for the lab check.   If you have access to a blood pressure cuff, please check the blood pressure from time to time and let me know how this is doing.  Also, please let me know if the wound gets any worse before seeing the wound clinic.

## 2018-09-18 NOTE — Telephone Encounter (Signed)
Scheduled appt with daughter of patient to see Dr Koberlein//tes

## 2018-09-18 NOTE — Telephone Encounter (Signed)
-----   Message from Lucretia Kern, DO sent at 09/18/2018  3:28 PM EDT ----- Can you set Wells Guiles up with the following: 1) in office visit the first week of sept for gait abnormality, flu shot and labs - preferably with Dr. Ethlyn Gallery, but another provider if she is not available Please call the following number: [phone] (this is her daughter). Thanks!

## 2018-09-18 NOTE — Progress Notes (Signed)
Virtual Visit via Video Note  I connected with [name] and her Daughter  on 09/18/18 at  1:00 PM EDT by a video enabled telemedicine application and verified that I am speaking with the correct person using two identifiers.  Location patient: home Location provider:work or home office Persons participating in the virtual visit: patient, provider  I discussed the limitations of evaluation and management by telemedicine and the availability of in person appointments. The patient expressed understanding and agreed to proceed.   HPI:  [name] is a pleasant 85 yo seen for follow up. She has a PMH PMR, osteoporosis, prior hypertension and poor wound healing. She was seeing rheumatology for a long time and has been on a very low daily dose of prednisone for years. When ever she stops its she suffers miserably with PMR symptoms and she and her daughter have opted to continue this indefinitely. She has been treated with abx for a wound on her leg. Today the report it looks better, but still has not healed. Apparently ir became a little festered and they just finish a course of abx that they had at home (doxy x 5 days.) Denies drainage or pain currently.  She reports a new concern of gait weakness for several months. Reports sometime feels she needs to use her walker as feels weak. She reports decreased activity during the pandemic, but still is able to do basic chores, get around the house and bake and cook without difficulty most of the time. Denies outright leg weakness or numbness, but simple feels needs to use her walker sometimes since March of 2020. They would like to get her flu shot and are agreeable to a lab visit.    ROS: See pertinent positives and negatives per HPI.  Past Medical History:  Diagnosis Date  . Alcohol abuse   . Emphysema of lung ([place]) 01/26/2009   pt. unsure of this reports as of 07/31/2012  . Fracture of fifth metatarsal bone of right foot 08/29/2012  . Gout  08/11/2012  . Hyperlipemia 03/27/2007  . Hypertension   . Osteoporosis 07/12/2006  . Pernicious anemia 10/25/2006  . Polymyalgia rheumatica ([place]) 03/27/2007   sees Dr. Charlestine Night in rheumatology  . Vitamin D deficiency 07/12/2006    Past Surgical History:  Procedure Laterality Date  . APPENDECTOMY  07/12/2006  . LUMBAR LAMINECTOMY/DECOMPRESSION MICRODISCECTOMY Right 02/13/2017   Procedure: Microdiscectomy - right - Lumbar three-Lumbar four;  Surgeon: Eustace Moore, MD;  Location: [place];  Service: Neurosurgery;  Laterality: Right;  . PARTIAL HIP ARTHROPLASTY     left side- 2x's ( 2nd surgery to correct 1st replacement), R side -   . TOOTH EXTRACTION N/A 08/04/2012   Procedure: EXTRACTIONS 17, 20;  Surgeon: Gae Bon, DDS;  Location: [place];  Service: Oral Surgery;  Laterality: N/A;    Family History  Problem Relation Age of Onset  . Mental illness Mother        alzheimer dementia  . Ulcers Father   . Heart disease Father   . Cancer Father        lung, smoker    SOCIAL HX: see hpi   Current Outpatient Medications:  .  alendronate (FOSAMAX) 70 MG tablet, TAKE 1 TABLET BY MOUTH ONCE A WEEK, Disp: 12 tablet, Rfl: 1 .  Cyanocobalamin (VITAMIN B-12) 1000 MCG SUBL, Place 1,000 mcg under the tongue daily. , Disp: , Rfl:  .  diphenhydrAMINE (BENADRYL) 25 MG tablet, Take 25 mg by mouth at bedtime  as needed for sleep., Disp: , Rfl:  .  Oyster Shell (OYSTER CALCIUM) 500 MG TABS tablet, Take 1,000 mg of elemental calcium by mouth daily., Disp: , Rfl:  .  predniSONE (DELTASONE) 2.5 MG tablet, Take 1 tablet (2.5 mg total) by mouth daily with breakfast., Disp: 90 tablet, Rfl: 1 .  vitamin C (ASCORBIC ACID) 250 MG tablet, Take 500 mg by mouth daily., Disp: , Rfl:   EXAM:  VITALS per patient if applicable:none today - did not have cuff today  GENERAL: alert, oriented, appears well and in no acute distress  HEENT: atraumatic, conjunttiva clear, no obvious abnormalities on inspection of external  nose and ears  NECK: normal movements of the head and neck  LUNGS: on inspection no signs of respiratory distress, breathing rate appears normal, no obvious gross SOB, gasping or wheezing  CV: no obvious cyanosis  MS: moves all visible extremities without noticeable abnormality  SKIN: poor quality video today, can see what looks like a shallow wound approx 2-3 cm in diameter on the affected ant leg does not appear to have sig erythema or edema around it today  PSYCH/NEURO: pleasant and cooperative, no obvious depression or anxiety, speech and thought processing grossly intact  ASSESSMENT AND PLAN:  Discussed the following assessment and plan:  Non-healing wound of lower extremity, unspecified laterality, subsequent encounter  Cellulitis of lower extremity, unspecified laterality  - Plan: Ambulatory referral to Wound Clinic - advised in office eval when comes for flu shot and gait eval to see if imaging or further abx are warranted prior to wound clinic assistance if there is a wait for the wound clinic.  History of unsteady gait -will have her come in for an in-person visit for evaluation of this, could simply be from less activity, in which case PT would help, but discussed potential for other etiologies as well. Sent message to scheduler to assist - they would like to do labs, visit and flu shot all on the same day.  POLYMYALGIA RHEUMATICA -discussed risks of the chronic prednisone, they prefer to continue as she does not do well off  Essential hypertension -they agree to check, will get BMP to check renal function  Daughter prefers they contact her at - [phone]; sent message to schedulers.   I discussed the assessment and treatment plan with the patient. The patient was provided an opportunity to ask questions and all were answered. The patient agreed with the plan and demonstrated an understanding of the instructions.   The patient was advised to call back or seek an  in-person evaluation if the symptoms worsen or if the condition fails to improve as anticipated.   Terressa KoyanagiHannah R [name], DO   Patient Instructions  I sent a message to my referral coordinator to help arrange a wound clinic visit.  I also sent a message to the scheduler to assist with setting up and in person visit in our office the first week of September about the weakness, to get a flu shot and to get labs. I placed the order for the lab check.   If you have access to a blood pressure cuff, please check the blood pressure from time to time and let me know how this is doing.  Also, please let me know if the wound gets any worse before seeing the wound clinic.

## 2018-09-20 ENCOUNTER — Other Ambulatory Visit: Payer: Self-pay | Admitting: Family Medicine

## 2018-09-25 ENCOUNTER — Ambulatory Visit: Payer: Self-pay

## 2018-09-25 NOTE — Telephone Encounter (Signed)
Spoke with pt and offered virtual appt w/xray at [facility]. Pt declined. She states she is not in significant pain and can wait until tomorrow. Advised pt if needs xray will still have to go to [facility] tomorrow. She agrees. Care advice given. Pt denies swelling or bruising and reports only minimal pain when walking.

## 2018-09-25 NOTE — Telephone Encounter (Signed)
Please see note below. We dont have x-ray here so patient may want to wait til tomorrow or go to urgent care

## 2018-09-25 NOTE — Telephone Encounter (Signed)
Patient called stating when she stood up OOB today she did something to her left foot.  She states that she felt something and feels that she may have broken a bone. She states that she does not know if it is swollen. She has a wound to this leg that is chronic and has swelling form that condition.  She states that the pain is 3-4 but only when ambulating. 0 when resting with no weight. She states that she walks on her heel. Patient has Hx of this injury to both feet and feels that is is a fracture from brittle bones. She has appointment scheduled for tomorrow. Care advice was read to patient.  She verbalized understanding. Note will be routed to office for possible appointment today.Pt understands all instructions.  Reason for Disposition . [1] Limp when walking AND [2] due to a twisted ankle or foot  Answer Assessment - Initial Assessment Questions 1. MECHANISM: "How did the injury happen?" (e.g., twisting injury, direct blow)      Getting out of bed 2. ONSET: "When did the injury happen?" (Minutes or hours ago)      today 3. LOCATION: "Where is the injury located?"      Left foot 4. APPEARANCE of INJURY: "What does the injury look like?"      Leg and ankle swollen 5. WEIGHT-BEARING: "Can you put weight on that foot?" "Can you walk (four steps or more)?"       Walking on heel 6. SIZE: For cuts, bruises, or swelling, ask: "How large is it?" (e.g., inches or centimeters;  entire joint)     No 7. PAIN: "Is there pain?" If so, ask: "How bad is the pain?"    (e.g., Scale 1-10; or mild, moderate, severe)    3-4 8. TETANUS: For any breaks in the skin, ask: "When was the last tetanus booster?"    N/A 9. OTHER SYMPTOMS: "Do you have any other symptoms?"      Leg swelling from wound already being treated 10. PREGNANCY: "Is there any chance you are pregnant?" "When was your last menstrual period?"      N/A  Protocols used: FOOT AND ANKLE INJURY-A-AH

## 2018-09-26 ENCOUNTER — Other Ambulatory Visit: Payer: Self-pay

## 2018-09-26 ENCOUNTER — Ambulatory Visit (INDEPENDENT_AMBULATORY_CARE_PROVIDER_SITE_OTHER)
Admission: RE | Admit: 2018-09-26 | Discharge: 2018-09-26 | Disposition: A | Discharge status: Home / Self Care | Payer: Medicare Other | Source: Ambulatory Visit | Attending: Family Medicine | Admitting: Family Medicine

## 2018-09-26 ENCOUNTER — Encounter: Payer: Self-pay | Admitting: Family Medicine

## 2018-09-26 ENCOUNTER — Ambulatory Visit (INDEPENDENT_AMBULATORY_CARE_PROVIDER_SITE_OTHER): Payer: Medicare Other | Admitting: Family Medicine

## 2018-09-26 VITALS — BP 150/70 | HR 75 | Temp 98.7°F | Ht 61.0 in | Wt 109.5 lb

## 2018-09-26 DIAGNOSIS — L03115 Cellulitis of right lower limb: Secondary | ICD-10-CM | POA: Diagnosis not present

## 2018-09-26 DIAGNOSIS — E559 Vitamin D deficiency, unspecified: Secondary | ICD-10-CM

## 2018-09-26 DIAGNOSIS — Z23 Encounter for immunization: Secondary | ICD-10-CM

## 2018-09-26 DIAGNOSIS — M7989 Other specified soft tissue disorders: Secondary | ICD-10-CM | POA: Diagnosis not present

## 2018-09-26 DIAGNOSIS — M810 Age-related osteoporosis without current pathological fracture: Secondary | ICD-10-CM | POA: Diagnosis not present

## 2018-09-26 DIAGNOSIS — I10 Essential (primary) hypertension: Secondary | ICD-10-CM

## 2018-09-26 DIAGNOSIS — D510 Vitamin B12 deficiency anemia due to intrinsic factor deficiency: Secondary | ICD-10-CM

## 2018-09-26 DIAGNOSIS — M79672 Pain in left foot: Secondary | ICD-10-CM

## 2018-09-26 LAB — COMPREHENSIVE METABOLIC PANEL
ALT: 9 U/L (ref 0–35)
AST: 18 U/L (ref 0–37)
Albumin: 3.8 g/dL (ref 3.5–5.2)
Alkaline Phosphatase: 58 U/L (ref 39–117)
BUN: 11 mg/dL (ref 6–23)
CO2: 31 mEq/L (ref 19–32)
Calcium: 9.3 mg/dL (ref 8.4–10.5)
Chloride: 103 mEq/L (ref 96–112)
Creatinine, Ser: 0.9 mg/dL (ref 0.40–1.20)
GFR: 59.03 mL/min — ABNORMAL LOW (ref >60.00)
Glucose, Bld: 96 mg/dL (ref 70–99)
Potassium: 4.7 mEq/L (ref 3.5–5.1)
Sodium: 142 mEq/L (ref 135–145)
Total Bilirubin: 0.5 mg/dL (ref 0.2–1.2)
Total Protein: 6.5 g/dL (ref 6.0–8.3)

## 2018-09-26 LAB — CBC WITH DIFFERENTIAL/PLATELET
Basophils Absolute: 0.1 10*3/uL (ref 0.0–0.1)
Basophils Relative: 0.7 % (ref 0.0–3.0)
Eosinophils Absolute: 0.3 10*3/uL (ref 0.0–0.7)
Eosinophils Relative: 3.1 % (ref 0.0–5.0)
HCT: 41.3 % (ref 36.0–46.0)
Hemoglobin: 13.8 g/dL (ref 12.0–15.0)
Lymphocytes Relative: 13 % (ref 12.0–46.0)
Lymphs Abs: 1.2 10*3/uL (ref 0.7–4.0)
MCHC: 33.3 g/dL (ref 30.0–36.0)
MCV: 99.2 fl (ref 78.0–100.0)
Monocytes Absolute: 0.5 10*3/uL (ref 0.1–1.0)
Monocytes Relative: 5.7 % (ref 3.0–12.0)
Neutro Abs: 6.9 10*3/uL (ref 1.4–7.7)
Neutrophils Relative %: 77.5 % — ABNORMAL HIGH (ref 43.0–77.0)
Platelets: 207 10*3/uL (ref 150.0–400.0)
RBC: 4.16 Mil/uL (ref 3.87–5.11)
RDW: 13 % (ref 11.5–15.5)
WBC: 8.9 10*3/uL (ref 4.0–10.5)

## 2018-09-26 LAB — VITAMIN B12: Vitamin B-12: 1061 pg/mL — ABNORMAL HIGH (ref 211–911)

## 2018-09-26 LAB — VITAMIN D 25 HYDROXY (VIT D DEFICIENCY, FRACTURES): VITD: 49.09 ng/mL (ref 30.00–100.00)

## 2018-09-26 MED ORDER — DOXYCYCLINE HYCLATE 100 MG PO TABS: 100.0000 mg | ORAL_TABLET | Freq: Two times a day (BID) | ORAL | 0 refills | Status: AC

## 2018-09-26 MED ORDER — FUROSEMIDE 20 MG PO TABS: 20.0000 mg | ORAL_TABLET | Freq: Every day | ORAL | 0 refills | Status: DC | PRN

## 2018-09-26 NOTE — Progress Notes (Signed)
[name] DOB: [birth year] Encounter date: 09/26/2018  This is a 85 y.o. female who presents with Chief Complaint  Patient presents with  . Follow-up  . Foot Injury    patient complains of left foot pain x yesterday morning when she woke up, no known injury    History of present illness: Had virtual visit with [facility] and was dx with cellulitis and referred to wound clinic. Recommended office eval, flu shot, and determination for continued abx if wait for clinic.   Is on prednisone for polymyalgia rheumatica.   HPI  Has appointment next week with wound clinic. She doesn't want to go because she is worried about pain.   Got out of bed and stood on left foot and thinks she broke something. Pain on lateral left foot. Feels same as when she broke other foot. Fine if sitting but hurts when she is standing or walking. Does ok if walking on heel.   Airs out wound on right leg during day; puts dressing on leg at night. 2 mornings last week had no drainage. More since then, but not much. Periodically feels like throb (light throb) around area of wound.   On left leg - dropped cane on it; then lower on left leg ran into box. Both of these are healing well.   Has had 3 series of antibiotics. Did have some delay with getting these due to insurance issue.   Right leg injury started when she slid off bed early July (11) electrical component of heated blanket) then left leg cane injury was 8/24.   Allergies  Allergen Reactions  . Aspirin Other (See Comments)    "threw up blood"  . Codeine Other (See Comments)    "threw up blood"  . Naproxen Other (See Comments)    Severe pain in joints and hands  . Oxycodone-Acetaminophen     REACTION: gastric reaction   Current Meds  Medication Sig  . alendronate (FOSAMAX) 70 MG tablet TAKE 1 TABLET BY MOUTH  WEEKLY WITH 8 OZ OF PLAIN  WATER 30 MINUTES BEFORE  FIRST FOOD, DRINK OR MEDS.  STAY UPRIGHT FOR 30 MINS  . Cyanocobalamin (VITAMIN B-12) 1000  MCG SUBL Place 1,000 mcg under the tongue daily.   . diphenhydrAMINE (BENADRYL) 25 MG tablet Take 25 mg by mouth at bedtime as needed for sleep.  Ethelda Chick. Oyster Shell (OYSTER CALCIUM) 500 MG TABS tablet Take 1,000 mg of elemental calcium by mouth daily.  . predniSONE (DELTASONE) 2.5 MG tablet Take 1 tablet (2.5 mg total) by mouth daily with breakfast.  . vitamin C (ASCORBIC ACID) 250 MG tablet Take 500 mg by mouth daily.    Review of Systems  Constitutional: Negative for chills, fatigue and fever.  Respiratory: Negative for cough, chest tightness, shortness of breath and wheezing.   Cardiovascular: Positive for leg swelling. Negative for chest pain and palpitations.  Skin: Positive for wound.    Objective:  BP (!) 150/70 (BP Location: Left Arm, Patient Position: Sitting, Cuff Size: Normal)   Pulse 75   Temp 98.7 F (37.1 C) (Temporal)   Ht 5\' 1"  (1.549 m)   Wt 109 lb 8 oz (49.7 kg)   SpO2 96%   BMI 20.69 kg/m   Weight: 109 lb 8 oz (49.7 kg)   BP Readings from Last 3 Encounters:  09/26/18 (!) 150/70  03/20/18 130/60  02/06/18 (!) 122/50   Wt Readings from Last 3 Encounters:  09/26/18 109 lb 8 oz (49.7 kg)  03/20/18  107 lb 12.8 oz (48.9 kg)  02/06/18 104 lb 3.2 oz (47.3 kg)    Physical Exam Constitutional:      General: She is not in acute distress.    Appearance: She is underweight.  Cardiovascular:     Rate and Rhythm: Normal rate and regular rhythm.     Heart sounds: Murmur present. Systolic murmur present with a grade of 1/6. No friction rub.  Pulmonary:     Effort: Pulmonary effort is normal. No respiratory distress.     Breath sounds: Normal breath sounds. No wheezing or rales.  Musculoskeletal:     Right lower leg: 1+ Edema present.     Left lower leg: 1+ Edema present.     Comments: Left foot is edematous.  There is tenderness with palpation of the dorsal aspect mid fifth metatarsal.  Skin:    Comments: Bilateral lower extremities have discoloration consistent with  chronic venous stasis.  Skin is somewhat thickened.  Right lower extremity has approximate 3 cm wound.  Light yellow drainage from wound.  There is no significant erythema surrounding this area.  There is edema surrounding this area and significant tenderness with palpation.  Skin feels almost blistered surrounding wound.  Left lower anterior leg approximate 2 cm wound.  There is mild tenderness around this.  There is some edema around this again with superficial fluid in the skin.  Clear drainage from this wound under scabbing.   Neurological:     Mental Status: She is alert and oriented to person, place, and time.  Psychiatric:        Behavior: Behavior normal.     Assessment/Plan  1. Cellulitis of right lower extremity I am concerned for infection due to tenderness around wounds and delayed wound healing.  Will initiate additional week of doxycycline prior to her seeing wound care next week.  We discussed importance of keeping fluid down in lower extremities to promote healing and better circulation.  Elevate legs whenever sitting.  Let us know if any worsening of wounds in the meanwhile. Lasix daily x 3 days then prn to help with edema in lower extremities.  - doxycycline (VIBRA-TABS) 100 MG tablet; Take 1 tablet (100 mg total) by mouth 2 (two) times daily for 7 days.  Dispense: 14 tablet; Refill: 0  2. Essential hypertension Blood pressure is elevated.  Reduce edema with short Lasix burst.  Advised monitoring pressures at home. - Comprehensive metabolic panel; Future - CBC with Differential/Platelet; Future - CBC with Differential/Platelet - Comprehensive metabolic panel  3. Osteoporosis without current pathological fracture, unspecified osteoporosis type - VITAMIN D 25 Hydroxy (Vit-D Deficiency, Fractures); Future - VITAMIN D 25 Hydroxy (Vit-D Deficiency, Fractures)  4. Pernicious anemia - Vitamin B12; Future - Vitamin B12  5. Vitamin D deficiency - VITAMIN D 25 Hydroxy (Vit-D  Deficiency, Fractures); Future - VITAMIN D 25 Hydroxy (Vit-D Deficiency, Fractures)  6. Left foot pain We will get x-ray to determine follow-up pending this.  She does have a walking boot she can wear at home in the meanwhile to ease discomfort. - DG Foot Complete Left; Future  7. Need for immunization against influenza - Flu Vaccine QUAD High Dose(Fluad)   Return if symptoms worsen or fail to improve.    Micheline Rough, MD

## 2018-09-26 NOTE — Patient Instructions (Signed)
Keep legs elevated whenever sitting/lying to help with swelling.   Lasix daily (in morning) for next 3 days to try and help take out some of swelling. Limit salt in diet. Use caution with position changes with this medication since it can lower your blood pressure. After taking for a few days you can just use as needed for swelling.   We will check in with you once we get bloodwork results.   You can complete xray at [facility].

## 2018-09-26 NOTE — Telephone Encounter (Signed)
Noted. Upcoming visit

## 2018-10-02 ENCOUNTER — Other Ambulatory Visit: Payer: Self-pay | Admitting: Family Medicine

## 2018-10-02 DIAGNOSIS — M79672 Pain in left foot: Secondary | ICD-10-CM

## 2018-10-06 ENCOUNTER — Other Ambulatory Visit (HOSPITAL_COMMUNITY): Admission: RE | Admit: 2018-10-06 | Payer: Medicare Other | Admitting: Internal Medicine

## 2018-10-06 ENCOUNTER — Encounter (HOSPITAL_BASED_OUTPATIENT_CLINIC_OR_DEPARTMENT_OTHER): Payer: Medicare Other | Attending: Internal Medicine

## 2018-10-06 ENCOUNTER — Other Ambulatory Visit: Payer: Self-pay

## 2018-10-06 DIAGNOSIS — Z7952 Long term (current) use of systemic steroids: Secondary | ICD-10-CM | POA: Insufficient documentation

## 2018-10-06 DIAGNOSIS — I87313 Chronic venous hypertension (idiopathic) with ulcer of bilateral lower extremity: Secondary | ICD-10-CM | POA: Diagnosis not present

## 2018-10-06 DIAGNOSIS — J439 Emphysema, unspecified: Secondary | ICD-10-CM | POA: Insufficient documentation

## 2018-10-06 DIAGNOSIS — L97812 Non-pressure chronic ulcer of other part of right lower leg with fat layer exposed: Secondary | ICD-10-CM | POA: Insufficient documentation

## 2018-10-06 DIAGNOSIS — L97822 Non-pressure chronic ulcer of other part of left lower leg with fat layer exposed: Secondary | ICD-10-CM | POA: Insufficient documentation

## 2018-10-06 DIAGNOSIS — I10 Essential (primary) hypertension: Secondary | ICD-10-CM | POA: Diagnosis not present

## 2018-10-06 DIAGNOSIS — M353 Polymyalgia rheumatica: Secondary | ICD-10-CM | POA: Diagnosis not present

## 2018-10-06 DIAGNOSIS — I87323 Chronic venous hypertension (idiopathic) with inflammation of bilateral lower extremity: Secondary | ICD-10-CM | POA: Insufficient documentation

## 2018-10-09 DIAGNOSIS — Z7952 Long term (current) use of systemic steroids: Secondary | ICD-10-CM | POA: Diagnosis not present

## 2018-10-09 DIAGNOSIS — I87323 Chronic venous hypertension (idiopathic) with inflammation of bilateral lower extremity: Secondary | ICD-10-CM | POA: Diagnosis not present

## 2018-10-09 DIAGNOSIS — I10 Essential (primary) hypertension: Secondary | ICD-10-CM | POA: Diagnosis not present

## 2018-10-09 DIAGNOSIS — L97822 Non-pressure chronic ulcer of other part of left lower leg with fat layer exposed: Secondary | ICD-10-CM | POA: Diagnosis not present

## 2018-10-09 DIAGNOSIS — M353 Polymyalgia rheumatica: Secondary | ICD-10-CM | POA: Diagnosis not present

## 2018-10-09 DIAGNOSIS — L97812 Non-pressure chronic ulcer of other part of right lower leg with fat layer exposed: Secondary | ICD-10-CM | POA: Diagnosis not present

## 2018-10-09 DIAGNOSIS — J439 Emphysema, unspecified: Secondary | ICD-10-CM | POA: Diagnosis not present

## 2018-10-10 LAB — AEROBIC CULTURE W GRAM STAIN (SUPERFICIAL SPECIMEN): Gram Stain: NONE SEEN

## 2018-10-10 LAB — AEROBIC CULTURE? (SUPERFICIAL SPECIMEN)

## 2018-10-14 DIAGNOSIS — I87323 Chronic venous hypertension (idiopathic) with inflammation of bilateral lower extremity: Secondary | ICD-10-CM | POA: Diagnosis not present

## 2018-10-14 DIAGNOSIS — I10 Essential (primary) hypertension: Secondary | ICD-10-CM | POA: Diagnosis not present

## 2018-10-14 DIAGNOSIS — S81802A Unspecified open wound, left lower leg, initial encounter: Secondary | ICD-10-CM | POA: Diagnosis not present

## 2018-10-14 DIAGNOSIS — L97822 Non-pressure chronic ulcer of other part of left lower leg with fat layer exposed: Secondary | ICD-10-CM | POA: Diagnosis not present

## 2018-10-14 DIAGNOSIS — Z7952 Long term (current) use of systemic steroids: Secondary | ICD-10-CM | POA: Diagnosis not present

## 2018-10-14 DIAGNOSIS — J439 Emphysema, unspecified: Secondary | ICD-10-CM | POA: Diagnosis not present

## 2018-10-14 DIAGNOSIS — M353 Polymyalgia rheumatica: Secondary | ICD-10-CM | POA: Diagnosis not present

## 2018-10-14 DIAGNOSIS — L97812 Non-pressure chronic ulcer of other part of right lower leg with fat layer exposed: Secondary | ICD-10-CM | POA: Diagnosis not present

## 2018-10-14 DIAGNOSIS — S81801A Unspecified open wound, right lower leg, initial encounter: Secondary | ICD-10-CM | POA: Diagnosis not present

## 2018-10-21 DIAGNOSIS — I87323 Chronic venous hypertension (idiopathic) with inflammation of bilateral lower extremity: Secondary | ICD-10-CM | POA: Diagnosis not present

## 2018-10-21 DIAGNOSIS — S81802A Unspecified open wound, left lower leg, initial encounter: Secondary | ICD-10-CM | POA: Diagnosis not present

## 2018-10-21 DIAGNOSIS — L97812 Non-pressure chronic ulcer of other part of right lower leg with fat layer exposed: Secondary | ICD-10-CM | POA: Diagnosis not present

## 2018-10-21 DIAGNOSIS — L97822 Non-pressure chronic ulcer of other part of left lower leg with fat layer exposed: Secondary | ICD-10-CM | POA: Diagnosis not present

## 2018-10-21 DIAGNOSIS — J439 Emphysema, unspecified: Secondary | ICD-10-CM | POA: Diagnosis not present

## 2018-10-21 DIAGNOSIS — Z7952 Long term (current) use of systemic steroids: Secondary | ICD-10-CM | POA: Diagnosis not present

## 2018-10-21 DIAGNOSIS — S81801A Unspecified open wound, right lower leg, initial encounter: Secondary | ICD-10-CM | POA: Diagnosis not present

## 2018-10-21 DIAGNOSIS — M353 Polymyalgia rheumatica: Secondary | ICD-10-CM | POA: Diagnosis not present

## 2018-10-21 DIAGNOSIS — I10 Essential (primary) hypertension: Secondary | ICD-10-CM | POA: Diagnosis not present

## 2018-10-21 DIAGNOSIS — I872 Venous insufficiency (chronic) (peripheral): Secondary | ICD-10-CM | POA: Diagnosis not present

## 2018-10-23 ENCOUNTER — Other Ambulatory Visit: Payer: Self-pay | Admitting: Family Medicine

## 2018-10-28 ENCOUNTER — Other Ambulatory Visit: Payer: Self-pay

## 2018-10-28 ENCOUNTER — Encounter (HOSPITAL_BASED_OUTPATIENT_CLINIC_OR_DEPARTMENT_OTHER): Payer: Medicare Other | Admitting: Internal Medicine

## 2018-10-28 DIAGNOSIS — L97812 Non-pressure chronic ulcer of other part of right lower leg with fat layer exposed: Secondary | ICD-10-CM | POA: Diagnosis not present

## 2018-10-28 DIAGNOSIS — L97822 Non-pressure chronic ulcer of other part of left lower leg with fat layer exposed: Secondary | ICD-10-CM | POA: Diagnosis not present

## 2018-10-28 DIAGNOSIS — I87323 Chronic venous hypertension (idiopathic) with inflammation of bilateral lower extremity: Secondary | ICD-10-CM | POA: Diagnosis not present

## 2018-10-28 DIAGNOSIS — I10 Essential (primary) hypertension: Secondary | ICD-10-CM | POA: Diagnosis not present

## 2018-10-28 DIAGNOSIS — S81802A Unspecified open wound, left lower leg, initial encounter: Secondary | ICD-10-CM | POA: Diagnosis not present

## 2018-10-28 DIAGNOSIS — S81801A Unspecified open wound, right lower leg, initial encounter: Secondary | ICD-10-CM | POA: Diagnosis not present

## 2018-10-28 NOTE — Progress Notes (Signed)
[name], [name] ([id number]) Visit Report for 10/28/2018 HPI Details Patient Name: [name], [name] Date of Service: 10/28/2018 1:45 PM Medical Record [id number] Patient Account Number: [id number] Date of Birth/Sex: Treating RN: August 31, 1928 (85 y.o. [name] FinnerF) [name], [name] Primary Care Provider: Kriste BasqueKIM, [name] Other Clinician: Referring Provider: Treating Provider/Extender:Baden Betsch, Buzzy HanMichael [name], Perlie MayoHANNAH [name] in Treatment: 3 History of Present Illness HPI Description: ADMISSION 10/06/2018 This is an 85 year old woman who is here for wounds on her bilateral anterior lower legs. She was previously seen in early 2017 with a wound on her left leg that was traumatic. Felt to have lymphedema and venous insufficiency at that time although she had a reflux study that did not show significant superficial reflux in the left lower extremity. This healed. At some point she has had stockings prescribed but she cannot get the standard stocking on. The patient is also on chronic prednisone I think related to polymyalgia rheumatica currently at 2.5 mg daily. The patient states that in July she slipped out of bed traumatizing the anterior part of her leg. She was seen by her primary care physicians beginning on 08/14/2018. It was noted to have some erythema and swelling she was given Keflex. On 7/30 she had doxycycline and I think he has had perhaps another round of doxycycline after this. Noted in their notes to have a history of chronic venous insufficiency and some edema. Sometime in August she traumatized her left anterior leg with the tip of her cane and has an open wound in this area as well. She has not been doing anything specific to the wounds themselves leaving them open to air and putting Band-Aids on top. Past medical history; hypertension, polymyalgia rheumatica on chronic prednisone, history of microdiscectomy, B12 deficiency, vitamin D deficiency, emphysema, osteoporosis ABIs in our clinic  were 0.96 on the right and 0.99 on the left. 9/22; 2 small wounds on the left anterior and right anterior tibial area. These are better than last week. We put her in 3 layer compression unfortunately she appears to have a new wound on the medial right lower leg anteriorly probably because of wrap injury. She is on chronic prednisone with a history of chronic polymyalgia rheumatica and has very frail skin on her lower extremities 9/29; 2 small wounds on the left anterior and right anterior tibial areas these continue to improve. The abrasion she had from last week appears to have closed over. 10/6; the patient had 2 wounds on the left anterior and right anterior tibial wounds. The left anterior wound is just about closed. Right anterior tibial is still open. Electronic Signature(s) Signed: 10/28/2018 5:29:39 PM By: Baltazar Najjarobson, Lisl Slingerland MD Entered By: Baltazar Najjarobson, Ezel Vallone on 10/28/2018 16:23:52 -------------------------------------------------------------------------------- Physical Exam Details Patient Name: [name], [name] Date of Service: 10/28/2018 1:45 PM Medical Record [id number] Patient Account Number: [id number] Date of Birth/Sex: Treating RN: August 31, 1928 (85 y.o. [name] FinnerF) [name], [name] Primary Care Provider: Kriste BasqueKIM, [name] Other Clinician: Referring Provider: Treating Provider/Extender:Ladine Kiper, Buzzy HanMichael [name], Perlie MayoHANNAH [name] in Treatment: 3 Constitutional Patient is hypertensive.. Pulse regular and within target range for patient.Marland Kitchen. Respirations regular, non-labored and within target range.. Temperature is normal and within the target range for the patient.Marland Kitchen. Appears in no distress. Eyes Conjunctivae clear. No discharge.no icterus. Respiratory work of breathing is normal. Cardiovascular Pedal pulses are palpable. No major edema. Integumentary (Hair, Skin) Severe hemosiderin deposition bilaterally. Psychiatric appears at normal baseline. Notes Wound exam; the left leg wound just medial to the  tibia is just about closed. He still has the wound on the  right distal anterior tibia. This looks fairly healthy. No debridement was felt to be necessary The area on the left medial anterior leg is just about closed very tiny area still looks fragile Electronic Signature(s) Signed: 10/28/2018 5:29:39 PM By: Baltazar Najjar MD Entered By: Baltazar Najjar on 10/28/2018 16:26:27 -------------------------------------------------------------------------------- Physician Orders Details Patient Name: [name], [name] Date of Service: 10/28/2018 1:45 PM Medical Record [id number] Patient Account Number: [id number] Date of Birth/Sex: Treating RN: 10-May-1928 (85 y.o. [name] Primary Care Provider: Kriste [name] Other Clinician: Referring Provider: Treating Provider/Extender:Carisma Troupe, Buzzy [name], Perlie Mayo in Treatment: 3 Verbal / Phone Orders: No Diagnosis Coding ICD-10 Coding Code Description L97.811 Non-pressure chronic ulcer of other part of right lower leg limited to breakdown of skin L97.821 Non-pressure chronic ulcer of other part of left lower leg limited to breakdown of skin I87.323 Chronic venous hypertension (idiopathic) with inflammation of bilateral lower extremity Z92.241 Personal history of systemic steroid therapy Follow-up Appointments Return Appointment in 1 week. Dressing Change Frequency Do not change entire dressing for one week. Skin Barriers/Peri-Wound Care Moisturizing lotion - both legs Primary Wound Dressing Wound #1 Left,Distal,Anterior Lower Leg Hydrofera Blue - READY Wound #3 Right,Distal Lower Leg Hydrofera Blue - READY Secondary Dressing Wound #1 Left,Distal,Anterior Lower Leg Dry Gauze ABD pad Wound #3 Right,Distal Lower Leg Dry Gauze ABD pad Edema Control Kerlix and Coban - Bilateral Avoid standing for long periods of time Elevate legs to the level of the heart or above for 30 minutes daily and/or when sitting, a frequency of: - 3  to 4 times a day. Exercise regularly Electronic Signature(s) Signed: 10/28/2018 5:29:39 PM By: Baltazar Najjar MD Signed: 10/28/2018 6:04:54 PM By: Yevonne Pax RN Entered By: Yevonne Pax on 10/28/2018 15:48:44 -------------------------------------------------------------------------------- Problem List Details Patient Name: [name], [name] Date of Service: 10/28/2018 1:45 PM Medical Record [id number] Patient Account Number: [id number] Date of Birth/Sex: Treating RN: 22-Jun-1928 (85 y.o. [name] Primary Care Provider: Kriste [name] Other Clinician: Referring Provider: Treating Provider/Extender: Tonye Pearson in Treatment: 3 Active Problems ICD-10 Evaluated Encounter Code Description Active Date Today Diagnosis L97.811 Non-pressure chronic ulcer of other part of right lower 10/06/2018 No Yes leg limited to breakdown of skin L97.821 Non-pressure chronic ulcer of other part of left lower 10/06/2018 No Yes leg limited to breakdown of skin I87.323 Chronic venous hypertension (idiopathic) with 10/06/2018 No Yes inflammation of bilateral lower extremity Z92.241 Personal history of systemic steroid therapy 10/06/2018 No Yes Inactive Problems Resolved Problems Electronic Signature(s) Signed: 10/28/2018 5:29:39 PM By: Baltazar Najjar MD Entered By: Baltazar Najjar on 10/28/2018 16:22:27 -------------------------------------------------------------------------------- Progress Note Details Patient Name: [name], [name] Date of Service: 10/28/2018 1:45 PM Medical Record [id number] Patient Account Number: [id number] Date of Birth/Sex: Treating RN: March 31, 1928 (85 y.o. [name] Primary Care Provider: Kriste [name] Other Clinician: Referring Provider: Treating Provider/Extender:Zaydah Nawabi, Buzzy [name], Perlie Mayo in Treatment: 3 Subjective History of Present Illness (HPI) ADMISSION 10/06/2018 This is an 85 year old woman who is here for wounds on her  bilateral anterior lower legs. She was previously seen in early 2017 with a wound on her left leg that was traumatic. Felt to have lymphedema and venous insufficiency at that time although she had a reflux study that did not show significant superficial reflux in the left lower extremity. This healed. At some point she has had stockings prescribed but she cannot get the standard stocking on. The patient is also on chronic prednisone I think related to polymyalgia rheumatica currently at 2.5 mg daily. The  patient states that in July she slipped out of bed traumatizing the anterior part of her leg. She was seen by her primary care physicians beginning on 08/14/2018. It was noted to have some erythema and swelling she was given Keflex. On 7/30 she had doxycycline and I think he has had perhaps another round of doxycycline after this. Noted in their notes to have a history of chronic venous insufficiency and some edema. Sometime in August she traumatized her left anterior leg with the tip of her cane and has an open wound in this area as well. She has not been doing anything specific to the wounds themselves leaving them open to air and putting Band-Aids on top. Past medical history; hypertension, polymyalgia rheumatica on chronic prednisone, history of microdiscectomy, B12 deficiency, vitamin D deficiency, emphysema, osteoporosis ABIs in our clinic were 0.96 on the right and 0.99 on the left. 9/22; 2 small wounds on the left anterior and right anterior tibial area. These are better than last week. We put her in 3 layer compression unfortunately she appears to have a new wound on the medial right lower leg anteriorly probably because of wrap injury. She is on chronic prednisone with a history of chronic polymyalgia rheumatica and has very frail skin on her lower extremities 9/29; 2 small wounds on the left anterior and right anterior tibial areas these continue to improve. The abrasion she had from  last week appears to have closed over. 10/6; the patient had 2 wounds on the left anterior and right anterior tibial wounds. The left anterior wound is just about closed. Right anterior tibial is still open. Objective Constitutional Patient is hypertensive.. Pulse regular and within target range for patient.Marland Kitchen Respirations regular, non-labored and within target range.. Temperature is normal and within the target range for the patient.Marland Kitchen Appears in no distress. Vitals Time Taken: 2:37 PM, Height: 61 in, Source: Stated, Weight: 109 lbs, Source: Stated, BMI: 20.6, Temperature: 98.2 F, Pulse: 76 bpm, Respiratory Rate: 18 breaths/min, Blood Pressure: 163/74 mmHg. Eyes Conjunctivae clear. No discharge.no icterus. Respiratory work of breathing is normal. Cardiovascular Pedal pulses are palpable. No major edema. Psychiatric appears at normal baseline. General Notes: Wound exam; the left leg wound just medial to the tibia is just about closed. ooHe still has the wound on the right distal anterior tibia. This looks fairly healthy. No debridement was felt to be necessary ooThe area on the left medial anterior leg is just about closed very tiny area still looks fragile Integumentary (Hair, Skin) Severe hemosiderin deposition bilaterally. Wound #1 status is Open. Original cause of wound was Trauma. The wound is located on the [facility] Lower Leg. The wound measures 0.1cm length x 0.1cm width x 0.1cm depth; 0.008cm^2 area and 0.001cm^3 volume. There is Fat Layer (Subcutaneous Tissue) Exposed exposed. There is no tunneling or undermining noted. There is a small amount of serosanguineous drainage noted. The wound margin is flat and intact. There is large (67- 100%) red granulation within the wound bed. There is no necrotic tissue within the wound bed. Wound #2 status is Healed - Epithelialized. Original cause of wound was Trauma. The wound is located on the Right,Anterior Lower Leg. The  wound measures 0cm length x 0cm width x 0cm depth; 0cm^2 area and 0cm^3 volume. There is no tunneling or undermining noted. There is a none present amount of drainage noted. The wound margin is flat and intact. There is no granulation within the wound bed. There is no necrotic tissue within the wound bed.  Wound #3 status is Open. Original cause of wound was Gradually Appeared. The wound is located on the Right,Distal Lower Leg. The wound measures 2cm length x 1.2cm width x 0.1cm depth; 1.885cm^2 area and 0.188cm^3 volume. There is Fat Layer (Subcutaneous Tissue) Exposed exposed. There is no tunneling or undermining noted. There is a medium amount of serosanguineous drainage noted. The wound margin is flat and intact. There is medium (34-66%) red granulation within the wound bed. There is a medium (34-66%) amount of necrotic tissue within the wound bed including Adherent Slough. Assessment Active Problems ICD-10 Non-pressure chronic ulcer of other part of right lower leg limited to breakdown of skin Non-pressure chronic ulcer of other part of left lower leg limited to breakdown of skin Chronic venous hypertension (idiopathic) with inflammation of bilateral lower extremity Personal history of systemic steroid therapy Plan Follow-up Appointments: Return Appointment in 1 week. Dressing Change Frequency: Do not change entire dressing for one week. Skin Barriers/Peri-Wound Care: Moisturizing lotion - both legs Primary Wound Dressing: Wound #1 Left,Distal,Anterior Lower Leg: Hydrofera Blue - READY Wound #3 Right,Distal Lower Leg: Hydrofera Blue - READY Secondary Dressing: Wound #1 Left,Distal,Anterior Lower Leg: Dry Gauze ABD pad Wound #3 Right,Distal Lower Leg: Dry Gauze ABD pad Edema Control: Kerlix and Coban - Bilateral Avoid standing for long periods of time Elevate legs to the level of the heart or above for 30 minutes daily and/or when sitting, a frequency of: - 3 to 4 times a  day. Exercise regularly 1. I am using Hydrofera Blue to both areas right and left. 2. Still in Kerlix Coban 3. The patient is to bring her own stocking for the left leg next week I am confident this will be closed. Electronic Signature(s) Signed: 10/28/2018 5:29:39 PM By: Baltazar Najjar MD Entered By: Baltazar Najjar on 10/28/2018 16:27:04 -------------------------------------------------------------------------------- SuperBill Details Patient Name: Date of Service: [name] 10/28/2018 Medical Record [id number] Patient Account Number: [id number] Date of Birth/Sex: Treating RN: 29-Dec-1928 (85 y.o. [name] Primary Care Provider: Kriste [name] Other Clinician: Referring Provider: Treating Provider/Extender:Audreyanna Butkiewicz, Buzzy [name], Perlie Mayo in Treatment: 3 Diagnosis Coding ICD-10 Codes Code Description [phone] Non-pressure chronic ulcer of other part of right lower leg limited to breakdown of skin L97.821 Non-pressure chronic ulcer of other part of left lower leg limited to breakdown of skin I87.323 Chronic venous hypertension (idiopathic) with inflammation of bilateral lower extremity Z92.241 Personal history of systemic steroid therapy Facility Procedures CPT4 Code: [id number] Description: 99214 - WOUND CARE VISIT-LEV 4 EST PT Modifier: Quantity: 1 Physician Procedures CPT4 Code Description: [id number] 99213 - WC PHYS LEVEL 3 - EST PT ICD-10 Diagnosis Description L97.811 Non-pressure chronic ulcer of other part of right lower le skin L97.821 Non-pressure chronic ulcer of other part of left lower leg skin I87.323 Chronic  venous hypertension (idiopathic) with inflammation extremity Modifier: g limited to breakdo limited to breakdow of bilateral lower Quantity: 1 wn of n of Electronic Signature(s) Signed: 10/28/2018 5:29:39 PM By: Baltazar Najjar MD Entered By: Baltazar Najjar on 10/28/2018 16:27:22

## 2018-11-04 ENCOUNTER — Other Ambulatory Visit: Payer: Self-pay

## 2018-11-04 ENCOUNTER — Encounter (HOSPITAL_BASED_OUTPATIENT_CLINIC_OR_DEPARTMENT_OTHER): Payer: Medicare Other | Attending: Internal Medicine | Admitting: Internal Medicine

## 2018-11-04 DIAGNOSIS — I87323 Chronic venous hypertension (idiopathic) with inflammation of bilateral lower extremity: Secondary | ICD-10-CM | POA: Diagnosis not present

## 2018-11-04 DIAGNOSIS — L97822 Non-pressure chronic ulcer of other part of left lower leg with fat layer exposed: Secondary | ICD-10-CM | POA: Diagnosis not present

## 2018-11-04 DIAGNOSIS — L97812 Non-pressure chronic ulcer of other part of right lower leg with fat layer exposed: Secondary | ICD-10-CM | POA: Diagnosis not present

## 2018-11-04 DIAGNOSIS — I10 Essential (primary) hypertension: Secondary | ICD-10-CM | POA: Diagnosis not present

## 2018-11-04 DIAGNOSIS — S81802A Unspecified open wound, left lower leg, initial encounter: Secondary | ICD-10-CM | POA: Diagnosis not present

## 2018-11-04 DIAGNOSIS — S81801A Unspecified open wound, right lower leg, initial encounter: Secondary | ICD-10-CM | POA: Diagnosis not present

## 2018-11-11 ENCOUNTER — Other Ambulatory Visit: Payer: Self-pay

## 2018-11-11 ENCOUNTER — Encounter (HOSPITAL_BASED_OUTPATIENT_CLINIC_OR_DEPARTMENT_OTHER): Payer: Medicare Other | Admitting: Internal Medicine

## 2018-11-11 DIAGNOSIS — S81802A Unspecified open wound, left lower leg, initial encounter: Secondary | ICD-10-CM | POA: Diagnosis not present

## 2018-11-11 DIAGNOSIS — S81801A Unspecified open wound, right lower leg, initial encounter: Secondary | ICD-10-CM | POA: Diagnosis not present

## 2018-11-12 DIAGNOSIS — I87311 Chronic venous hypertension (idiopathic) with ulcer of right lower extremity: Secondary | ICD-10-CM | POA: Diagnosis not present

## 2018-11-18 ENCOUNTER — Encounter (HOSPITAL_BASED_OUTPATIENT_CLINIC_OR_DEPARTMENT_OTHER): Payer: Medicare Other | Admitting: Internal Medicine

## 2018-11-18 DIAGNOSIS — S81802A Unspecified open wound, left lower leg, initial encounter: Secondary | ICD-10-CM | POA: Diagnosis not present

## 2018-11-18 DIAGNOSIS — I10 Essential (primary) hypertension: Secondary | ICD-10-CM | POA: Diagnosis not present

## 2018-11-18 DIAGNOSIS — I87323 Chronic venous hypertension (idiopathic) with inflammation of bilateral lower extremity: Secondary | ICD-10-CM | POA: Diagnosis not present

## 2018-11-18 DIAGNOSIS — L97822 Non-pressure chronic ulcer of other part of left lower leg with fat layer exposed: Secondary | ICD-10-CM | POA: Diagnosis not present

## 2018-11-18 DIAGNOSIS — L97812 Non-pressure chronic ulcer of other part of right lower leg with fat layer exposed: Secondary | ICD-10-CM | POA: Diagnosis not present

## 2018-11-18 DIAGNOSIS — I872 Venous insufficiency (chronic) (peripheral): Secondary | ICD-10-CM | POA: Diagnosis not present

## 2018-11-18 DIAGNOSIS — S81801A Unspecified open wound, right lower leg, initial encounter: Secondary | ICD-10-CM | POA: Diagnosis not present

## 2018-11-27 ENCOUNTER — Encounter (HOSPITAL_BASED_OUTPATIENT_CLINIC_OR_DEPARTMENT_OTHER): Payer: Medicare Other | Attending: Internal Medicine | Admitting: Internal Medicine

## 2018-11-27 ENCOUNTER — Other Ambulatory Visit: Payer: Self-pay

## 2018-11-27 DIAGNOSIS — M353 Polymyalgia rheumatica: Secondary | ICD-10-CM | POA: Diagnosis not present

## 2018-11-27 DIAGNOSIS — I872 Venous insufficiency (chronic) (peripheral): Secondary | ICD-10-CM | POA: Diagnosis not present

## 2018-11-27 DIAGNOSIS — L97821 Non-pressure chronic ulcer of other part of left lower leg limited to breakdown of skin: Secondary | ICD-10-CM | POA: Diagnosis not present

## 2018-11-27 DIAGNOSIS — S81801A Unspecified open wound, right lower leg, initial encounter: Secondary | ICD-10-CM | POA: Diagnosis not present

## 2018-11-27 DIAGNOSIS — Z7952 Long term (current) use of systemic steroids: Secondary | ICD-10-CM | POA: Diagnosis not present

## 2018-11-27 DIAGNOSIS — I10 Essential (primary) hypertension: Secondary | ICD-10-CM | POA: Diagnosis not present

## 2018-11-27 DIAGNOSIS — L97811 Non-pressure chronic ulcer of other part of right lower leg limited to breakdown of skin: Secondary | ICD-10-CM | POA: Diagnosis not present

## 2018-11-27 NOTE — Progress Notes (Addendum)
[name], [name] ([id number]) Visit Report for 11/27/2018 Arrival Information Details Patient Name: Date of Service: [name] 11/27/2018 1:45 PM Medical Record [id number] Patient Account Number: [id number] Date of Birth/Sex: Treating RN: 1929-01-22 (85 y.o. [name], [name] Primary Care [name]: [name] Other Clinician: Referring [name]: Treating [name]/Extender:Robson, Rosilyn [name], [name] in Treatment: 7 Visit Information History Since Last Visit Cane Added or deleted any medications: No Patient Arrived: 14:38 Any new allergies or adverse reactions: No Arrival Time: Had a fall or experienced change in No Accompanied By: dgt None activities of daily living that may affect Transfer Assistance: risk of falls: Patient Identification Verified: Yes Signs or symptoms of abuse/neglect since last No Secondary Verification Process Completed: Yes visito Patient Requires Transmission-Based No Hospitalized since last visit: No Precautions: Implantable device outside of the clinic excluding No Patient Has Alerts: No cellular tissue based products placed in the center since last visit: Has Dressing in Place as Prescribed: Yes Has Compression in Place as Prescribed: Yes Pain Present Now: No Electronic Signature(s) Signed: 11/27/2018 6:27:24 PM By: Baruch Gouty RN, [name] Entered By: Baruch Gouty on 11/27/2018 14:40:26 -------------------------------------------------------------------------------- Clinic Level of Care Assessment Details Patient Name: Date of Service: [name], [name] 11/27/2018 1:45 PM Medical Record [id number] Patient Account Number: [id number] Date of Birth/Sex: Treating RN: 07/02/1928 (85 y.o. [name] Primary Care [name]: [name] Other Clinician: Referring Cassell Voorhies: Treating [name]/Extender:Robson, Rosilyn [name], [name] in Treatment: 7 Clinic Level of Care Assessment Items TOOL 4  Quantity Score X - Use when only an EandM is performed on FOLLOW-UP visit 1 0 ASSESSMENTS - Nursing Assessment / Reassessment X - Reassessment of Co-morbidities (includes updates in patient status) 1 10 X - Reassessment of Adherence to Treatment Plan 1 5 ASSESSMENTS - Wound and Skin Assessment / Reassessment X - Simple Wound Assessment / Reassessment - one wound 1 5 []  - Complex Wound Assessment / Reassessment - multiple wounds 0 X - Dermatologic / Skin Assessment (not related to wound area) 1 10 ASSESSMENTS - Focused Assessment X - Circumferential Edema Measurements - multi extremities 1 5 []  - Nutritional Assessment / Counseling / Intervention 0 []  - Lower Extremity Assessment (monofilament, tuning fork, pulses) 0 []  - Peripheral Arterial Disease Assessment (using hand held doppler) 0 ASSESSMENTS - Ostomy and/or Continence Assessment and Care []  - Incontinence Assessment and Management 0 []  - Ostomy Care Assessment and Management (repouching, etc.) 0 PROCESS - Coordination of Care X - Simple Patient / Family Education for ongoing care 1 15 []  - Complex (extensive) Patient / Family Education for ongoing care 0 X - Staff obtains Programmer, systems, Records, Test Results / Process Orders 1 10 []  - Staff telephones HHA, Nursing Homes / Clarify orders / etc 0 []  - Routine Transfer to another Facility (non-emergent condition) 0 []  - Routine Hospital Admission (non-emergent condition) 0 []  - New Admissions / Biomedical engineer / Ordering NPWT, Apligraf, etc. 0 []  - Emergency Hospital Admission (emergent condition) 0 X - Simple Discharge Coordination 1 10 []  - Complex (extensive) Discharge Coordination 0 PROCESS - Special Needs []  - Pediatric / Minor Patient Management 0 []  - Isolation Patient Management 0 []  - Hearing / Language / Visual special needs 0 []  - Assessment of Community assistance (transportation, D/C planning, etc.) 0 []  - Additional assistance / Altered mentation 0 []  - Support  Surface(s) Assessment (bed, cushion, seat, etc.) 0 INTERVENTIONS - Wound Cleansing / Measurement X - Simple Wound Cleansing - one wound 1 5 []  - Complex Wound  Cleansing - multiple wounds 0 X - Wound Imaging (photographs - any number of wounds) 1 5 []  - Wound Tracing (instead of photographs) 0 X - Simple Wound Measurement - one wound 1 5 []  - Complex Wound Measurement - multiple wounds 0 INTERVENTIONS - Wound Dressings []  - Small Wound Dressing one or multiple wounds 0 []  - Medium Wound Dressing one or multiple wounds 0 []  - Large Wound Dressing one or multiple wounds 0 []  - Application of Medications - topical 0 []  - Application of Medications - injection 0 INTERVENTIONS - Miscellaneous []  - External ear exam 0 []  - Specimen Collection (cultures, biopsies, blood, body fluids, etc.) 0 []  - Specimen(s) / Culture(s) sent or taken to Lab for analysis 0 []  - Patient Transfer (multiple staff / / Similar devices) 0 []  - Simple Staple / Suture removal (25 or less) 0 []  - Complex Staple / Suture removal (26 or more) 0 []  - Hypo / Hyperglycemic Management (close monitor of Blood Glucose) 0 []  - Ankle / Brachial Index (ABI) - do not check if billed separately 0 X - Vital Signs 1 5 Has the patient been seen at the hospital within the last three years: Yes Total Score: 90 Level Of Care: New/Established - Level 3 Electronic Signature(s) Signed: 11/27/2018 5:16:49 PM By: Entered By: on 11/27/2018 15:06:19 -------------------------------------------------------------------------------- Encounter Discharge Information Details Patient Name: Date of Service: T. 11/27/2018 1:45 PM Medical Record Patient Account Number: Date of Birth/Sex: Treating RN: December 27, 1928 (85 y.o. Nurse, adult Primary Care Kemora Pinard: Other Clinician: Referring Blu Mcglaun: Treating [name]/Extender:Robson, ,  in Treatment: 7 Encounter Discharge Information Items Discharge Condition: Stable Ambulatory Status: Cane Discharge Destination: Home Transportation: Private Auto Accompanied By: daughter Schedule Follow-up Appointment: Yes Clinical Summary of Care: Patient Declined Electronic Signature(s) Signed: 11/27/2018 6:27:24 PM By: 13/05/2018 RN, [name] Entered By: Cherylin Mylar on 11/27/2018 15:18:53 -------------------------------------------------------------------------------- Lower Extremity Assessment Details Patient Name: Date of Service: 13/05/2018 T. 11/27/2018 1:45 PM Medical Record 13/05/2018 Patient Account Number: [id number] Date of Birth/Sex: Treating RN: 05/24/1928 (85 y.o. 98 Primary Care Charee Tumblin: Tommye Standard Other Clinician: Referring Elek Holderness: Treating Saranne Crislip/Extender:Robson, [name], Buzzy [name] in Treatment: 7 Edema Assessment Assessed: [Left: No] [Right: No] Edema: [Left: N] [Right: o] Calf Left: Right: Point of Measurement: 34 cm From Medial Instep cm 27.1 cm Ankle Left: Right: Point of Measurement: 9 cm From Medial Instep cm 17.9 cm Vascular Assessment Pulses: Dorsalis Pedis Palpable: [Right:Yes] Electronic Signature(s) Signed: 11/27/2018 6:27:24 PM By: 13/05/2018 RN, [name] Entered By: Zenaida Deed on 11/27/2018 14:51:43 -------------------------------------------------------------------------------- Multi Wound Chart Details Patient Name: Date of Service: 13/05/2018 T. 11/27/2018 1:45 PM Medical Record 13/05/2018 Patient Account Number: [id number] Date of Birth/Sex: Treating RN: 01/03/1929 (85 y.o. 98, Tommye Standard Primary Care Petros Ahart: [name] Other Clinician: Referring Yazmin Locher: Treating Adam Sanjuan/Extender:Robson, Buzzy [name], Perlie Mayo in Treatment: 7 Vital Signs Height(in): 61 Pulse(bpm): 76 Weight(lbs): 109 Blood Pressure(mmHg): 160/82 Body Mass Index(BMI):  21 Temperature(F): 98.2 Respiratory 18 Rate(breaths/min): Photos: [3:No Photos] [N/A:N/A] Wound Location: [3:Right Lower Leg - Anterior, N/A Distal] Wounding Event: [3:Gradually Appeared] [N/A:N/A] Primary Etiology: [3:Venous Leg Ulcer] [N/A:N/A] Comorbid History: [3:Anemia, Hypertension, Osteoarthritis] [N/A:N/A] Date Acquired: [3:10/14/2018] [N/A:N/A] Weeks of Treatment: [3:6] [N/A:N/A] Wound Status: [3:Healed - Epithelialized] [N/A:N/A] Measurements L x W x D 0x0x0 [N/A:N/A] (cm) Area (cm) : [3:0] [N/A:N/A] Volume (cm) : [3:0] [N/A:N/A] % Reduction in Area: [3:100.00%] [N/A:N/A] % Reduction in Volume: 100.00% [  N/A:N/A] Classification: [3:Full Thickness Without Exposed Support Structures] [N/A:N/A] Exudate Amount: [3:None Present] [N/A:N/A] Wound Margin: [3:Flat and Intact] [N/A:N/A] Granulation Amount: [3:None Present (0%)] [N/A:N/A] Necrotic Amount: [3:None Present (0%)] [N/A:N/A] Exposed Structures: [3:Fascia: No Fat Layer (Subcutaneous Tissue) Exposed: No Tendon: No Muscle: No Joint: No Bone: No Large (67-100%)] [N/A:N/A N/A] Treatment Notes Electronic Signature(s) Signed: 11/27/2018 5:30:28 PM By: Shawn Stalleaton, Bobbi Signed: 11/27/2018 5:45:12 PM By: Baltazar Najjarobson, Michael MD Entered By: Baltazar Najjarobson, Michael on 11/27/2018 15:16:59 -------------------------------------------------------------------------------- Multi-Disciplinary Care Plan Details Patient Name: Date of Service: [name], [name] 11/27/2018 1:45 PM Medical Record [id number] Patient Account Number: [id number] Date of Birth/Sex: Treating RN: [birth year] (85 y.o. [name]) Dwiggins, Shannon Primary Care Myley Bahner: Kriste BasqueKIM, HANNAH Other Clinician: Referring Sayer Masini: Treating Zakeria Kulzer/Extender:Robson, Buzzy HanMichael [name], Perlie MayoHANNAH Weeks in Treatment: 7 Active Inactive Electronic Signature(s) Signed: 11/27/2018 5:16:49 PM By: Cherylin Mylarwiggins, Shannon Entered By: Cherylin Mylarwiggins, Shannon on 11/27/2018  14:58:21 -------------------------------------------------------------------------------- Pain Assessment Details Patient Name: Date of Service: [name], [name] 11/27/2018 1:45 PM Medical Record [id number] Patient Account Number: [id number] Date of Birth/Sex: Treating RN: [birth year] (85 y.o. Tommye StandardF) Boehlein, [name] Primary Care Taneisha Fuson: Kriste BasqueKIM, HANNAH Other Clinician: Referring Analeise Mccleery: Treating Ilze Roselli/Extender:Robson, Buzzy HanMichael [name], Perlie MayoHANNAH Weeks in Treatment: 7 Active Problems Location of Pain Severity and Description of Pain Patient Has Paino Yes Site Locations Pain Location: Pain in Ulcers With Dressing Change: Yes Duration of the Pain. Constant / Intermittento Intermittent Rate the pain. Current Pain Level: 0 Worst Pain Level: 3 Least Pain Level: 0 Character of Pain Describe the Pain: Throbbing Pain Management and Medication Current Pain Management: Medication: Yes Is the Current Pain Management Adequate: Adequate How does your wound impact your activities of daily livingo Sleep: Yes Bathing: No Appetite: No Relationship With Others: No Bladder Continence: No Emotions: No Bowel Continence: No Work: No Toileting: No Drive: No Dressing: No Hobbies: No Electronic Signature(s) Signed: 11/27/2018 6:27:24 PM By: Zenaida DeedBoehlein, Linda RN, [name] Entered By: Zenaida DeedBoehlein, [name] on 11/27/2018 14:43:31 -------------------------------------------------------------------------------- Patient/Caregiver Education Details Rentz, Jhoanna 11/5/2020andnbsp1:45 Patient Name: Date of Service: T. PM Medical Record Patient Account Number: [id number] Number: Treating RN: Cherylin Mylarwiggins, Shannon Date of Birth/Gender: [birth year] (85 y.o. F) Other Clinician: Primary Care Physician:[name], Dahlia ClientHANNAH Treating Baltazar Najjarobson, Michael Referring Physician: Physician/Extender: Tonye PearsonKIM, HANNAH Weeks in Treatment: 7 Education Assessment Education Provided To: Patient Education Topics  Provided Wound/Skin Impairment: Handouts: Skin Care Do's and Dont's Methods: Explain/Verbal Responses: State content correctly Electronic Signature(s) Signed: 11/27/2018 5:16:49 PM By: Cherylin Mylarwiggins, Shannon Entered By: Cherylin Mylarwiggins, Shannon on 11/27/2018 15:04:58 -------------------------------------------------------------------------------- Wound Assessment Details Patient Name: Date of Service: [name], [name] 11/27/2018 1:45 PM Medical Record [id number] Patient Account Number: [id number] Date of Birth/Sex: Treating RN: [birth year] (85 y.o. Tommye StandardF) Boehlein, [name] Primary Care Aniko Finnigan: Kriste BasqueKIM, HANNAH Other Clinician: Referring Shunna Mikaelian: Treating Quavis Klutz/Extender:Robson, Buzzy HanMichael [name], Perlie MayoHANNAH Weeks in Treatment: 7 Wound Status Wound Number: [id number] Primary Etiology: Venous Leg Ulcer Wound Location: Right Lower Leg - Anterior, Distal Wound Status: Healed - Epithelialized Wounding Event: Gradually Appeared Comorbid History: Anemia, Hypertension, Osteoarthritis Date Acquired: 10/14/2018 Weeks Of Treatment: 6 Clustered Wound: No Photos Wound Measurements Length: (cm) 0 % Reduct Width: (cm) 0 % Reduct Depth: (cm) 0 Epitheli Area: (cm) 0 Tunneli Volume: (cm) 0 Undermi Wound Description Classification: Full Thickness Without Exposed Support Foul Od Structures Slough/ Wound Flat and Intact Margin: Exudate None Present Amount: Wound Bed Granulation Amount: None Present (0%) Necrotic Amount: None Present (0%) Fascia Ex Fat Layer Tendon Ex Muscle Ex Joint Exp Bone Expo or After Cleansing: No Fibrino No Exposed Structure posed: No (Subcutaneous Tissue) Exposed: No posed: No posed:  No osed: No sed: No ion in Area: 100% ion in Volume: 100% alization: Large (67-100%) ng: No ning: No Electronic Signature(s) Signed: 11/28/2018 6:12:06 PM By: Zenaida Deed RN, [name] Signed: 12/01/2018 4:06:15 PM By: Benjaman Kindler EMT/HBOT Previous Signature: 11/27/2018 6:27:24 PM Version By:  Zenaida Deed RN, [name] Entered By: Benjaman Kindler on 11/28/2018 12:22:39 -------------------------------------------------------------------------------- Vitals Details Patient Name: Date of Service: [name] Lathe T. 11/27/2018 1:45 PM Medical Record [id number] Patient Account Number: [id number] Date of Birth/Sex: Treating RN: [birth year] (85 y.o. Tommye Standard Primary Care Mal Asher: [name] Other Clinician: Referring Jeison Delpilar: Treating Cameryn Schum/Extender:Robson, Buzzy [name], Perlie Mayo in Treatment: 7 Vital Signs Time Taken: 14:40 Temperature (F): 98.2 Height (in): 61 Pulse (bpm): 76 Source: Stated Respiratory Rate (breaths/min): 18 Weight (lbs): 109 Blood Pressure (mmHg): 160/82 Source: Stated Reference Range: 80 - 120 mg / dl Body Mass Index (BMI): 20.6 Electronic Signature(s) Signed: 11/27/2018 6:27:24 PM By: Zenaida Deed RN, [name] Entered By: Zenaida Deed on 11/27/2018 14:43:50

## 2018-11-27 NOTE — Progress Notes (Signed)
[name], [name] ([id number]) Visit Report for 11/27/2018 HPI Details Patient Name: [name], [name] Date of Service: 11/27/2018 1:45 PM Medical Record [id number] Patient Account Number: [id number] Date of Birth/Sex: Treating RN: 1928/05/23 (85 y.o. Arta SilenceF) Deaton, Bobbi Primary Care Provider: Kriste BasqueKIM, HANNAH Other Clinician: Referring Provider: Treating Provider/Extender:Rella Egelston, Buzzy HanMichael [name], Perlie MayoHANNAH Weeks in Treatment: 7 History of Present Illness HPI Description: ADMISSION 10/06/2018 This is an 85 year old woman who is here for wounds on her bilateral anterior lower legs. She was previously seen in early 2017 with a wound on her left leg that was traumatic. Felt to have lymphedema and venous insufficiency at that time although she had a reflux study that did not show significant superficial reflux in the left lower extremity. This healed. At some point she has had stockings prescribed but she cannot get the standard stocking on. The patient is also on chronic prednisone I think related to polymyalgia rheumatica currently at 2.5 mg daily. The patient states that in July she slipped out of bed traumatizing the anterior part of her leg. She was seen by her primary care physicians beginning on 08/14/2018. It was noted to have some erythema and swelling she was given Keflex. On 7/30 she had doxycycline and I think he has had perhaps another round of doxycycline after this. Noted in their notes to have a history of chronic venous insufficiency and some edema. Sometime in August she traumatized her left anterior leg with the tip of her cane and has an open wound in this area as well. She has not been doing anything specific to the wounds themselves leaving them open to air and putting Band-Aids on top. Past medical history; hypertension, polymyalgia rheumatica on chronic prednisone, history of microdiscectomy, B12 deficiency, vitamin D deficiency, emphysema, osteoporosis ABIs in our clinic  were 0.96 on the right and 0.99 on the left. 9/22; 2 small wounds on the left anterior and right anterior tibial area. These are better than last week. We put her in 3 layer compression unfortunately she appears to have a new wound on the medial right lower leg anteriorly probably because of wrap injury. She is on chronic prednisone with a history of chronic polymyalgia rheumatica and has very frail skin on her lower extremities 9/29; 2 small wounds on the left anterior and right anterior tibial areas these continue to improve. The abrasion she had from last week appears to have closed over. 10/6; the patient had 2 wounds on the left anterior and right anterior tibial wounds. The left anterior wound is just about closed. Right anterior tibial is still open. 10/13; the patient's leg is healed on the left as predicted. The area on the right is still open but measuring smaller 10/20; the patient comes in today with a small area on the left medial lower leg. The area on the right is still open but measuring smaller. We had put her in a dual layer stocking last week on the left she is not able to get the 2 layers on she will only wear 1 layer. 10/27; the left medial lower leg is closed. The right one is just about closed. We continue to dress the right leg and compression we put her 11/5; the left medial leg is closed and she has a juxta lite stocking on although her daughter had to apply it. The right one is closed today and we will put a juxta lite stocking on this leg as well. The patient states most of the skin changes are due to  chronic steroid use. To the point this is true but she has tightly fibrotic skin in her distal lower extremities suggesting some degree of chronic venous insufficiency as well. She does not have a lot of edema in her leg Electronic Signature(s) Signed: 11/27/2018 5:45:12 PM By: Linton Ham MD Entered By: Linton Ham on 11/27/2018  15:17:58 -------------------------------------------------------------------------------- Physical Exam Details Patient Name: [name], [name] Date of Service: 11/27/2018 1:45 PM Medical Record [id number] Patient Account Number: [id number] Date of Birth/Sex: Treating RN: 12-Nov-1928 (85 y.o. Debby Bud Primary Care Provider: Colin Benton Other Clinician: Referring Provider: Treating Provider/Extender:Jasia Hiltunen, Rosilyn Mings, Carmela Hurt in Treatment: 7 Constitutional Patient is hypotensive.. Pulse regular and within target range for patient.Marland Kitchen Respirations regular, non-labored and within target range.. Temperature is normal and within the target range for the patient.Marland Kitchen Appears in no distress. Eyes Conjunctivae clear. No discharge.no icterus. Respiratory work of breathing is normal. Cardiovascular Pedal pulses palpable and strong bilaterally.. Edema is well controlled. Integumentary (Hair, Skin) While the patient does have changes of skin suggestive of chronic steroid use the adherent fibrosed skin in the distal lower leg suggested something more than steroid use is at work year. I think she has coexisting chronic venous insufficiency and probably has for a long time. Psychiatric appears at normal baseline. Notes Wound exam; both lower extremities have no wounds. Her edema is well controlled pedal pulses are palpable. Electronic Signature(s) Signed: 11/27/2018 5:45:12 PM By: Linton Ham MD Entered By: Linton Ham on 11/27/2018 15:18:58 -------------------------------------------------------------------------------- Physician Orders Details Patient Name: [name], [name] Date of Service: 11/27/2018 1:45 PM Medical Record [id number] Patient Account Number: [id number] Date of Birth/Sex: Treating RN: Apr 22, 1928 (85 y.o. Clearnce Sorrel Primary Care Provider: Colin Benton Other Clinician: Referring Provider: Treating Provider/Extender:Aaradhya Kysar,  Rosilyn Mings, Carmela Hurt in Treatment: 7 Verbal / Phone Orders: No Diagnosis Coding ICD-10 Coding Code Description L97.811 Non-pressure chronic ulcer of other part of right lower leg limited to breakdown of skin L97.821 Non-pressure chronic ulcer of other part of left lower leg limited to breakdown of skin I87.323 Chronic venous hypertension (idiopathic) with inflammation of bilateral lower extremity Z92.241 Personal history of systemic steroid therapy Discharge From [facility] Services Discharge from [place] - call if you develop any wounds Skin Barriers/Peri-Wound Care Moisturizing lotion - daily, at night Edema Control Avoid standing for long periods of time Elevate legs to the level of the heart or above for 30 minutes daily and/or when sitting, a frequency of: Exercise regularly Support Garment 20-30 mm/Hg pressure to: - continue to wear juxalites to both legs, place on in am and remove at night Electronic Signature(s) Signed: 11/27/2018 5:16:49 PM By: Kela Millin Signed: 11/27/2018 5:45:12 PM By: Linton Ham MD Entered By: Kela Millin on 11/27/2018 15:04:20 -------------------------------------------------------------------------------- Problem List Details Patient Name: Ask, Aldean T. Date of Service: 11/27/2018 1:45 PM Medical Record [id number] Patient Account Number: [id number] Date of Birth/Sex: Treating RN: 1928/10/01 (85 y.o. Clearnce Sorrel Primary Care Provider: Colin Benton Other Clinician: Referring Provider: Treating Provider/Extender:Amil Moseman, Rosilyn Mings, Carmela Hurt in Treatment: 7 Active Problems ICD-10 Evaluated Encounter Code Description Active Date Today Diagnosis L97.811 Non-pressure chronic ulcer of other part of right lower 10/06/2018 No Yes leg limited to breakdown of skin L97.821 Non-pressure chronic ulcer of other part of left lower 10/06/2018 No Yes leg limited to breakdown of skin I87.323 Chronic venous  hypertension (idiopathic) with 10/06/2018 No Yes inflammation of bilateral lower extremity Z92.241 Personal history of systemic steroid therapy 10/06/2018 No Yes Inactive Problems Resolved Problems  Electronic Signature(s) Signed: 11/27/2018 5:45:12 PM By: Baltazar Najjar MD Entered By: Baltazar Najjar on 11/27/2018 15:16:52 -------------------------------------------------------------------------------- Progress Note Details Patient Name: Laubach, Tuwanda T. Date of Service: 11/27/2018 1:45 PM Medical Record [id number] Patient Account Number: [id number] Date of Birth/Sex: Treating RN: 1928/08/15 (85 y.o. Arta Silence Primary Care Provider: [name] Other Clinician: Referring Provider: Treating Provider/Extender:Breunna Nordmann, Buzzy [name], Perlie Mayo in Treatment: 7 Subjective History of Present Illness (HPI) ADMISSION 10/06/2018 This is an 85 year old woman who is here for wounds on her bilateral anterior lower legs. She was previously seen in early 2017 with a wound on her left leg that was traumatic. Felt to have lymphedema and venous insufficiency at that time although she had a reflux study that did not show significant superficial reflux in the left lower extremity. This healed. At some point she has had stockings prescribed but she cannot get the standard stocking on. The patient is also on chronic prednisone I think related to polymyalgia rheumatica currently at 2.5 mg daily. The patient states that in July she slipped out of bed traumatizing the anterior part of her leg. She was seen by her primary care physicians beginning on 08/14/2018. It was noted to have some erythema and swelling she was given Keflex. On 7/30 she had doxycycline and I think he has had perhaps another round of doxycycline after this. Noted in their notes to have a history of chronic venous insufficiency and some edema. Sometime in August she traumatized her left anterior leg with the tip of her cane  and has an open wound in this area as well. She has not been doing anything specific to the wounds themselves leaving them open to air and putting Band-Aids on top. Past medical history; hypertension, polymyalgia rheumatica on chronic prednisone, history of microdiscectomy, B12 deficiency, vitamin D deficiency, emphysema, osteoporosis ABIs in our clinic were 0.96 on the right and 0.99 on the left. 9/22; 2 small wounds on the left anterior and right anterior tibial area. These are better than last week. We put her in 3 layer compression unfortunately she appears to have a new wound on the medial right lower leg anteriorly probably because of wrap injury. She is on chronic prednisone with a history of chronic polymyalgia rheumatica and has very frail skin on her lower extremities 9/29; 2 small wounds on the left anterior and right anterior tibial areas these continue to improve. The abrasion she had from last week appears to have closed over. 10/6; the patient had 2 wounds on the left anterior and right anterior tibial wounds. The left anterior wound is just about closed. Right anterior tibial is still open. 10/13; the patient's leg is healed on the left as predicted. The area on the right is still open but measuring smaller 10/20; the patient comes in today with a small area on the left medial lower leg. The area on the right is still open but measuring smaller. We had put her in a dual layer stocking last week on the left she is not able to get the 2 layers on she will only wear 1 layer. 10/27; the left medial lower leg is closed. The right one is just about closed. We continue to dress the right leg and compression we put her 11/5; the left medial leg is closed and she has a juxta lite stocking on although her daughter had to apply it. The right one is closed today and we will put a juxta lite stocking on this leg as well.  The patient states most of the skin changes are due to chronic steroid  use. To the point this is true but she has tightly fibrotic skin in her distal lower extremities suggesting some degree of chronic venous insufficiency as well. She does not have a lot of edema in her leg Objective Constitutional Patient is hypotensive.. Pulse regular and within target range for patient.Marland Kitchen Respirations regular, non-labored and within target range.. Temperature is normal and within the target range for the patient.Marland Kitchen Appears in no distress. Vitals Time Taken: 2:40 PM, Height: 61 in, Source: Stated, Weight: 109 lbs, Source: Stated, BMI: 20.6, Temperature: 98.2 F, Pulse: 76 bpm, Respiratory Rate: 18 breaths/min, Blood Pressure: 160/82 mmHg. Eyes Conjunctivae clear. No discharge.no icterus. Respiratory work of breathing is normal. Cardiovascular Pedal pulses palpable and strong bilaterally.. Edema is well controlled. Psychiatric appears at normal baseline. General Notes: Wound exam; both lower extremities have no wounds. Her edema is well controlled pedal pulses are palpable. Integumentary (Hair, Skin) While the patient does have changes of skin suggestive of chronic steroid use the adherent fibrosed skin in the distal lower leg suggested something more than steroid use is at work year. I think she has coexisting chronic venous insufficiency and probably has for a long time. Wound #3 status is Healed - Epithelialized. Original cause of wound was Gradually Appeared. The wound is located on the Right,Distal,Anterior Lower Leg. The wound measures 0cm length x 0cm width x 0cm depth; 0cm^2 area and 0cm^3 volume. There is no tunneling or undermining noted. There is a none present amount of drainage noted. The wound margin is flat and intact. There is no granulation within the wound bed. There is no necrotic tissue within the wound bed. Assessment Active Problems ICD-10 Non-pressure chronic ulcer of other part of right lower leg limited to breakdown of skin Non-pressure chronic  ulcer of other part of left lower leg limited to breakdown of skin Chronic venous hypertension (idiopathic) with inflammation of bilateral lower extremity Personal history of systemic steroid therapy Plan Discharge From [facility] Services: Discharge from Wound Care Center - call if you develop any wounds Skin Barriers/Peri-Wound Care: Moisturizing lotion - daily, at night Edema Control: Avoid standing for long periods of time Elevate legs to the level of the heart or above for 30 minutes daily and/or when sitting, a frequency of: Exercise regularly Support Garment 20-30 mm/Hg pressure to: - continue to wear juxalites to both legs, place on in am and remove at night 1. The patient to be discharged from the wound care center 2. Moisturize skin nightly 3. Bilateral juxta lite stockings Electronic Signature(s) Signed: 11/27/2018 5:45:12 PM By: Baltazar Najjar MD Entered By: Baltazar Najjar on 11/27/2018 15:19:29 -------------------------------------------------------------------------------- SuperBill Details Patient Name: Date of Service: [name]. 11/27/2018 Medical Record [id number] Patient Account Number: [id number] Date of Birth/Sex: Treating RN: [birth year] (85 y.o. Harvest Dark Primary Care Provider: [name] Other Clinician: Referring Provider: Treating Provider/Extender:Mixtli Reno, Buzzy [name], Perlie Mayo in Treatment: 7 Diagnosis Coding ICD-10 Codes Code Description [phone] Non-pressure chronic ulcer of other part of right lower leg limited to breakdown of skin L97.821 Non-pressure chronic ulcer of other part of left lower leg limited to breakdown of skin I87.323 Chronic venous hypertension (idiopathic) with inflammation of bilateral lower extremity Z92.241 Personal history of systemic steroid therapy Facility Procedures The patient participates with Medicare or their insurance follows the Medicare Facility Guidelines: CPT4 Code Description Modifier  Quantity [id number] 99213 - WOUND CARE VISIT-LEV 3 EST PT 1 Physician Procedures CPT4 Code  Description: [id number] - WC PHYS LEVEL 3 - EST PT ICD-10 Diagnosis Description L97.811 Non-pressure chronic ulcer of other part of right lower leg skin L97.821 Non-pressure chronic ulcer of other part of left lower leg skin Z92.241 Personal  history of systemic steroid therapy I87.323 Chronic venous hypertension (idiopathic) with inflammation extremity Modifier: limited to breakdow limited to breakdown of bilateral lower Quantity: 1 n of of Electronic Signature(s) Signed: 11/27/2018 5:45:12 PM By: Baltazar Najjar MD Entered By: Baltazar Najjar on 11/27/2018 15:19:51

## 2018-12-07 ENCOUNTER — Other Ambulatory Visit: Payer: Self-pay | Admitting: Family Medicine

## 2018-12-24 ENCOUNTER — Other Ambulatory Visit: Payer: Self-pay

## 2018-12-24 ENCOUNTER — Encounter: Payer: Self-pay | Admitting: Internal Medicine

## 2018-12-24 ENCOUNTER — Telehealth (INDEPENDENT_AMBULATORY_CARE_PROVIDER_SITE_OTHER): Payer: Medicare Other | Admitting: Internal Medicine

## 2018-12-24 DIAGNOSIS — B349 Viral infection, unspecified: Secondary | ICD-10-CM

## 2018-12-24 DIAGNOSIS — R509 Fever, unspecified: Secondary | ICD-10-CM | POA: Diagnosis not present

## 2018-12-24 NOTE — Progress Notes (Signed)
   Virtual Visit via Telephone Note  I connected with@ on 12/24/18 at 11:30 AM EST by telephone and verified that I am speaking with the correct person using two identifiers.   I discussed the limitations, risks, security and privacy concerns of performing an evaluation and management service by telephone and the availability of in person appointments. I also discussed with the patient that there may be a patient responsible charge related to this service. The patient expressed understanding and agreed to proceed.  Location patient: home Location provider: work or home office Participants present for the call: patient, provider  Daughter dee Irish Elders  Patient did not have a visit in the prior 7 days to address this/these issue(s).   History of Present Illness: [name]   Patient lives alone and had the acute onset this morning of low-grade fever body aches chills  HA vomiting x1 without localized pain.  No specific exposures daughter Noralyn Pick came over at her request.  She is able to tolerate water is alert but in bed No unusual rashes chest pain shortness of breath or cough.  No diarrhea. Took some Tylenol.  No current active vomiting Most recent antibiotics a few months ago for a wound infection cellulitis. She has PMR on low-dose prednisone 2.5.  ROS: See pertinent positives and negatives per HPI.  No syncope UTI symptoms Observations/Objective: Patient daughter  sounds alert on the phone. I do not appreciate any SOB. Speech  Patient reported vitals:t 99.6  Assessment and Plan:  Acute viral disease  Low grade fever  Follow Up Instructions: Disc  Sx could be covid 19 but no spec exposures   Close observation  Fluids  And seek emergenct care if  Serious pain sob  Dehydration  progression toxic findings   .  At this point daughter has  Mask n 95 and hle but has been around  Previously  dsic alarm sx and  covid 19 testing as Op vs  Need for emergent care     [id number] 5-10 [id number] 11-20 94443 21-30 I did not refer this patient for an OV in the next 24 hours for this/these issue(s).  I discussed the assessment and treatment plan with the daughter /patient. The patient was provided an opportunity to ask questions and all were answered. The patient agreed with the plan and demonstrated an understanding of the instructions.   Thedaughter  was advised to call back or seek an in-person evaluation if the symptoms worsen or if the condition fails to improve as anticipated.  I provided 13 minutes of non-face-to-face time during this encounter. Return if symptoms worsen or fail to improve as expected.  Shanon Ace, MD

## 2018-12-29 ENCOUNTER — Telehealth: Payer: Self-pay | Admitting: *Deleted

## 2018-12-29 ENCOUNTER — Telehealth: Payer: Self-pay

## 2018-12-29 NOTE — Telephone Encounter (Signed)
Copied from [place] [id number]. Topic: General - Other >> Dec 29, 2018 11:55 AM Keene Breath wrote: Reason for CRM: Patient would like the nurse to call her to advise if she should have the COVID test.  Please advise and call back at [phone]

## 2018-12-29 NOTE — Telephone Encounter (Signed)
Patient returned call to office and inquired about going to get COVID testing done due to symptoms last week. Patient stated that she would go to [facility] for testing to make sure she did not have the virus.

## 2018-12-29 NOTE — Telephone Encounter (Signed)
No answer at the pts home number. 

## 2018-12-30 ENCOUNTER — Other Ambulatory Visit: Payer: Self-pay

## 2018-12-30 DIAGNOSIS — Z20822 Contact with and (suspected) exposure to covid-19: Secondary | ICD-10-CM

## 2018-12-30 NOTE — Progress Notes (Signed)
[name], [name] ([id number]) Visit Report for 11/18/2018 HPI Details Patient Name: [name], [name] Date of Service: 11/18/2018 1:45 PM Medical Record [id number] Patient Account Number: [id number] Date of Birth/Sex: Treating RN: Oct 04, 1928 (85 y.o. [name] Primary Care Provider: [name] Other Clinician: Referring Provider: Treating Provider/Extender:Dayonna Selbe, Buzzy [name], Perlie Mayo in Treatment: 6 History of Present Illness HPI Description: ADMISSION 10/06/2018 This is an 85 year old woman who is here for wounds on her bilateral anterior lower legs. She was previously seen in early 2017 with a wound on her left leg that was traumatic. Felt to have lymphedema and venous insufficiency at that time although she had a reflux study that did not show significant superficial reflux in the left lower extremity. This healed. At some point she has had stockings prescribed but she cannot get the standard stocking on. The patient is also on chronic prednisone I think related to polymyalgia rheumatica currently at 2.5 mg daily. The patient states that in July she slipped out of bed traumatizing the anterior part of her leg. She was seen by her primary care physicians beginning on 08/14/2018. It was noted to have some erythema and swelling she was given Keflex. On 7/30 she had doxycycline and I think he has had perhaps another round of doxycycline after this. Noted in their notes to have a history of chronic venous insufficiency and some edema. Sometime in August she traumatized her left anterior leg with the tip of her cane and has an open wound in this area as well. She has not been doing anything specific to the wounds themselves leaving them open to air and putting Band-Aids on top. Past medical history; hypertension, polymyalgia rheumatica on chronic prednisone, history of microdiscectomy, B12 deficiency, vitamin D deficiency, emphysema, osteoporosis ABIs in our clinic  were 0.96 on the right and 0.99 on the left. 9/22; 2 small wounds on the left anterior and right anterior tibial area. These are better than last week. We put her in 3 layer compression unfortunately she appears to have a new wound on the medial right lower leg anteriorly probably because of wrap injury. She is on chronic prednisone with a history of chronic polymyalgia rheumatica and has very frail skin on her lower extremities 9/29; 2 small wounds on the left anterior and right anterior tibial areas these continue to improve. The abrasion she had from last week appears to have closed over. 10/6; the patient had 2 wounds on the left anterior and right anterior tibial wounds. The left anterior wound is just about closed. Right anterior tibial is still open. 10/13; the patient's leg is healed on the left as predicted. The area on the right is still open but measuring smaller 10/20; the patient comes in today with a small area on the left medial lower leg. The area on the right is still open but measuring smaller. We had put her in a dual layer stocking last week on the left she is not able to get the 2 layers on she will only wear 1 layer. 10/27; the left medial lower leg is closed. The right one is just about closed. We continue to dress the right leg and compression we put her Electronic Signature(s) Signed: 11/18/2018 6:05:35 PM By: Baltazar Najjar MD Entered By: Baltazar Najjar on 11/18/2018 17:35:54 -------------------------------------------------------------------------------- Physical Exam Details Patient Name: [name], [name] Date of Service: 11/18/2018 1:45 PM Medical Record [id number] Patient Account Number: [id number] Date of Birth/Sex: Treating RN: 06/17/1928 (85 y.o. [name] Primary Care  Provider: Colin Benton Other Clinician: Referring Provider: Treating Provider/Extender:Ezekiah Massie, Rosilyn Mings, Carmela Hurt in Treatment: 6 Constitutional Patient is  hypertensive.. Pulse regular and within target range for patient.Marland Kitchen Respirations regular, non-labored and within target range.. Temperature is normal and within the target range for the patient.Marland Kitchen Appears in no distress. Eyes Conjunctivae clear. No discharge.no icterus. Respiratory work of breathing is normal. Cardiovascular Pedal pulses palpable.. Good edema control. Integumentary (Hair, Skin) Changes of chronic venous insufficiency. Psychiatric appears at normal baseline. Notes Wound exam; the patient has a small wound on the right anterior medial tibia. This is just about closed. On the left she is closed. Pedal pulses palpable Electronic Signature(s) Signed: 11/18/2018 6:05:35 PM By: Linton Ham MD Entered By: Linton Ham on 11/18/2018 17:38:48 -------------------------------------------------------------------------------- Physician Orders Details Patient Name: [name], [name] Date of Service: 11/18/2018 1:45 PM Medical Record [id number] Patient Account Number: [id number] Date of Birth/Sex: Treating RN: 05-Sep-1928 (85 y.o. Orvan Falconer Primary Care Provider: Colin Benton Other Clinician: Referring Provider: Treating Provider/Extender:Tyronica Truxillo, Rosilyn Mings, Carmela Hurt in Treatment: 6 Verbal / Phone Orders: No Diagnosis Coding ICD-10 Coding Code Description L97.811 Non-pressure chronic ulcer of other part of right lower leg limited to breakdown of skin L97.821 Non-pressure chronic ulcer of other part of left lower leg limited to breakdown of skin I87.323 Chronic venous hypertension (idiopathic) with inflammation of bilateral lower extremity Z92.241 Personal history of systemic steroid therapy Follow-up Appointments Return Appointment in 1 week. Dressing Change Frequency Do not change entire dressing for one week. Skin Barriers/Peri-Wound Care Moisturizing lotion - both legs Primary Wound Dressing Wound #3 Right,Distal,Anterior Lower Leg Hydrofera  Blue - READY Secondary Dressing Wound #3 Right,Distal,Anterior Lower Leg Dry Gauze ABD pad Edema Control Kerlix and Coban - Right Lower Extremity Avoid standing for long periods of time Elevate legs to the level of the heart or above for 30 minutes daily and/or when sitting, a frequency of: - 3 to 4 times a day. Exercise regularly Support Garment 20-30 mm/Hg pressure to: - left juxtalite HD, on in the am , off in the pm , apply lotion at night Electronic Signature(s) Signed: 11/18/2018 6:05:35 PM By: Linton Ham MD Signed: 12/30/2018 3:01:15 PM By: Carlene Coria RN Entered By: Carlene Coria on 11/18/2018 15:29:50 -------------------------------------------------------------------------------- Problem List Details Patient Name: Bishop, Della T. Date of Service: 11/18/2018 1:45 PM Medical Record [id number] Patient Account Number: [id number] Date of Birth/Sex: Treating RN: 09-12-1928 (85 y.o. Orvan Falconer Primary Care Provider: Colin Benton Other Clinician: Referring Provider: Treating Provider/Extender:Melinda Pottinger, Rosilyn Mings, Carmela Hurt in Treatment: 6 Active Problems ICD-10 Evaluated Encounter Code Description Active Date Today Diagnosis L97.811 Non-pressure chronic ulcer of other part of right lower 10/06/2018 No Yes leg limited to breakdown of skin L97.821 Non-pressure chronic ulcer of other part of left lower 10/06/2018 No Yes leg limited to breakdown of skin I87.323 Chronic venous hypertension (idiopathic) with 10/06/2018 No Yes inflammation of bilateral lower extremity Z92.241 Personal history of systemic steroid therapy 10/06/2018 No Yes Inactive Problems Resolved Problems Electronic Signature(s) Signed: 11/18/2018 6:05:35 PM By: Linton Ham MD Entered By: Linton Ham on 11/18/2018 17:34:33 -------------------------------------------------------------------------------- Progress Note Details Patient Name: Nevils, Miliani T. Date of Service:  11/18/2018 1:45 PM Medical Record [id number] Patient Account Number: [id number] Date of Birth/Sex: Treating RN: 11/12/1928 (85 y.o. Orvan Falconer Primary Care Provider: Colin Benton Other Clinician: Referring Provider: Treating Provider/Extender:Keera Altidor, Rosilyn Mings, Carmela Hurt in Treatment: 6 Subjective History of Present Illness (HPI) ADMISSION 10/06/2018 This is an 85 year old woman who is here for wounds  on her bilateral anterior lower legs. She was previously seen in early 2017 with a wound on her left leg that was traumatic. Felt to have lymphedema and venous insufficiency at that time although she had a reflux study that did not show significant superficial reflux in the left lower extremity. This healed. At some point she has had stockings prescribed but she cannot get the standard stocking on. The patient is also on chronic prednisone I think related to polymyalgia rheumatica currently at 2.5 mg daily. The patient states that in July she slipped out of bed traumatizing the anterior part of her leg. She was seen by her primary care physicians beginning on 08/14/2018. It was noted to have some erythema and swelling she was given Keflex. On 7/30 she had doxycycline and I think he has had perhaps another round of doxycycline after this. Noted in their notes to have a history of chronic venous insufficiency and some edema. Sometime in August she traumatized her left anterior leg with the tip of her cane and has an open wound in this area as well. She has not been doing anything specific to the wounds themselves leaving them open to air and putting Band-Aids on top. Past medical history; hypertension, polymyalgia rheumatica on chronic prednisone, history of microdiscectomy, B12 deficiency, vitamin D deficiency, emphysema, osteoporosis ABIs in our clinic were 0.96 on the right and 0.99 on the left. 9/22; 2 small wounds on the left anterior and right anterior tibial area. These are  better than last week. We put her in 3 layer compression unfortunately she appears to have a new wound on the medial right lower leg anteriorly probably because of wrap injury. She is on chronic prednisone with a history of chronic polymyalgia rheumatica and has very frail skin on her lower extremities 9/29; 2 small wounds on the left anterior and right anterior tibial areas these continue to improve. The abrasion she had from last week appears to have closed over. 10/6; the patient had 2 wounds on the left anterior and right anterior tibial wounds. The left anterior wound is just about closed. Right anterior tibial is still open. 10/13; the patient's leg is healed on the left as predicted. The area on the right is still open but measuring smaller 10/20; the patient comes in today with a small area on the left medial lower leg. The area on the right is still open but measuring smaller. We had put her in a dual layer stocking last week on the left she is not able to get the 2 layers on she will only wear 1 layer. 10/27; the left medial lower leg is closed. The right one is just about closed. We continue to dress the right leg and compression we put her Objective Constitutional Patient is hypertensive.. Pulse regular and within target range for patient.Marland Kitchen Respirations regular, non-labored and within target range.. Temperature is normal and within the target range for the patient.Marland Kitchen Appears in no distress. Vitals Time Taken: 2:32 PM, Height: 61 in, Weight: 109 lbs, BMI: 20.6, Temperature: 98.5 F, Pulse: 75 bpm, Respiratory Rate: 16 breaths/min, Blood Pressure: 147/74 mmHg. Eyes Conjunctivae clear. No discharge.no icterus. Respiratory work of breathing is normal. Cardiovascular Pedal pulses palpable.. Good edema control. Psychiatric appears at normal baseline. General Notes: Wound exam; the patient has a small wound on the right anterior medial tibia. This is just about closed. On the left  she is closed. Pedal pulses palpable Integumentary (Hair, Skin) Changes of chronic venous insufficiency. Wound #3  status is Open. Original cause of wound was Gradually Appeared. The wound is located on the Right,Distal,Anterior Lower Leg. The wound measures 1cm length x 0.2cm width x 0.1cm depth; 0.157cm^2 area and 0.016cm^3 volume. There is Fat Layer (Subcutaneous Tissue) Exposed exposed. There is no tunneling or undermining noted. There is a medium amount of serosanguineous drainage noted. The wound margin is flat and intact. There is large (67-100%) red, pink granulation within the wound bed. There is no necrotic tissue within the wound bed. Assessment Active Problems ICD-10 Non-pressure chronic ulcer of other part of right lower leg limited to breakdown of skin Non-pressure chronic ulcer of other part of left lower leg limited to breakdown of skin Chronic venous hypertension (idiopathic) with inflammation of bilateral lower extremity Personal history of systemic steroid therapy Plan Interim from Follow-up Appointments: Return Appointment in 1 week. Dressing Change Frequency: Do not change entire dressing for one week. Skin Barriers/Peri-Wound Care: Moisturizing lotion - both legs Primary Wound Dressing: Wound #3 Right,Distal,Anterior Lower Leg: Hydrofera Blue - READY Secondary Dressing: Wound #3 Right,Distal,Anterior Lower Leg: Dry Gauze ABD pad Edema Control: Kerlix and Coban - Right Lower Extremity Avoid standing for long periods of time Elevate legs to the level of the heart or above for 30 minutes daily and/or when sitting, a frequency of: - 3 to 4 times a day. Exercise regularly Support Garment 20-30 mm/Hg pressure to: - left juxtalite HD, on in the am , off in the pm , apply lotion at night 1. [facility] ready to the right anterior leg Curlex and Coban 2. We coached her through putting a juxta light on the left leg. There are not a lot of  options Electronic Signature(s) Signed: 11/18/2018 6:05:35 PM By: Baltazar Najjarobson, Chrissi Crow MD Entered By: Baltazar Najjarobson, Shereena Berquist on 11/18/2018 17:39:56 -------------------------------------------------------------------------------- SuperBill Details Patient Name: Date of Service: Shan LevansEDMONDSON, Suriya T. 11/18/2018 Medical Record [id number] Patient Account Number: [id number] Date of Birth/Sex: Treating RN: Dec 27, 1928 (85 y.o. Freddy FinnerF) Epps, Carrie Primary Care Provider: Kriste BasqueKIM, HANNAH Other Clinician: Referring Provider: Treating Provider/Extender:Naiomy Watters, Buzzy HanMichael [name], Perlie MayoHANNAH Weeks in Treatment: 6 Diagnosis Coding ICD-10 Codes Code Description [id number] Non-pressure chronic ulcer of other part of right lower leg limited to breakdown of skin L97.821 Non-pressure chronic ulcer of other part of left lower leg limited to breakdown of skin I87.323 Chronic venous hypertension (idiopathic) with inflammation of bilateral lower extremity Z92.241 Personal history of systemic steroid therapy Facility Procedures CPT4 Code: [id number] Description: 99213 - WOUND CARE VISIT-LEV 3 EST PT Modifier: Quantity: 1 Physician Procedures CPT4 Code Description: [id number] 99213 - WC PHYS LEVEL 3 - EST PT ICD-10 Diagnosis Description L97.811 Non-pressure chronic ulcer of other part of right lower le skin L97.821 Non-pressure chronic ulcer of other part of left lower leg skin I87.323 Chronic  venous hypertension (idiopathic) with inflammation extremity Modifier: g limited to breakdo limited to breakdow of bilateral lower Quantity: 1 wn of n of Electronic Signature(s) Signed: 11/18/2018 6:05:35 PM By: Baltazar Najjarobson, Adolphus Hanf MD Entered By: Baltazar Najjarobson, Ketra Duchesne on 11/18/2018 17:40:17

## 2018-12-30 NOTE — Progress Notes (Signed)
Shan LevansDMONDSON, Odesser T. ([id number]) Visit Report for 11/11/2018 HPI Details Patient Name: [name], [name] Date of Service: 11/11/2018 1:00 PM Medical Record [id number] Patient Account Number: [id number] Date of Birth/Sex: Treating RN: [birth year] (85 y.o. Freddy FinnerF) Epps, Carrie Primary Care Provider: Kriste BasqueKIM, HANNAH Other Clinician: Referring Provider: Treating Provider/Extender:Merita Hawks, Buzzy HanMichael [name], Perlie MayoHANNAH Weeks in Treatment: 5 History of Present Illness HPI Description: ADMISSION 10/06/2018 This is an 85 year old woman who is here for wounds on her bilateral anterior lower legs. She was previously seen in early 2017 with a wound on her left leg that was traumatic. Felt to have lymphedema and venous insufficiency at that time although she had a reflux study that did not show significant superficial reflux in the left lower extremity. This healed. At some point she has had stockings prescribed but she cannot get the standard stocking on. The patient is also on chronic prednisone I think related to polymyalgia rheumatica currently at 2.5 mg daily. The patient states that in July she slipped out of bed traumatizing the anterior part of her leg. She was seen by her primary care physicians beginning on 08/14/2018. It was noted to have some erythema and swelling she was given Keflex. On 7/30 she had doxycycline and I think he has had perhaps another round of doxycycline after this. Noted in their notes to have a history of chronic venous insufficiency and some edema. Sometime in August she traumatized her left anterior leg with the tip of her cane and has an open wound in this area as well. She has not been doing anything specific to the wounds themselves leaving them open to air and putting Band-Aids on top. Past medical history; hypertension, polymyalgia rheumatica on chronic prednisone, history of microdiscectomy, B12 deficiency, vitamin D deficiency, emphysema, osteoporosis ABIs in our clinic  were 0.96 on the right and 0.99 on the left. 9/22; 2 small wounds on the left anterior and right anterior tibial area. These are better than last week. We put her in 3 layer compression unfortunately she appears to have a new wound on the medial right lower leg anteriorly probably because of wrap injury. She is on chronic prednisone with a history of chronic polymyalgia rheumatica and has very frail skin on her lower extremities 9/29; 2 small wounds on the left anterior and right anterior tibial areas these continue to improve. The abrasion she had from last week appears to have closed over. 10/6; the patient had 2 wounds on the left anterior and right anterior tibial wounds. The left anterior wound is just about closed. Right anterior tibial is still open. 10/13; the patient's leg is healed on the left as predicted. The area on the right is still open but measuring smaller 10/20; the patient comes in today with a small area on the left medial lower leg. The area on the right is still open but measuring smaller. We had put her in a dual layer stocking last week on the left she is not able to get the 2 layers on she will only wear 1 layer. Electronic Signature(s) Signed: 11/11/2018 6:17:56 PM By: Baltazar Najjarobson, Omarr Hann MD Entered By: Baltazar Najjarobson, Ayaan Shutes on 11/11/2018 15:21:53 -------------------------------------------------------------------------------- Physical Exam Details Patient Name: [name], [name] Date of Service: 11/11/2018 1:00 PM Medical Record [id number] Patient Account Number: [id number] Date of Birth/Sex: Treating RN: [birth year] (85 y.o. Freddy FinnerF) Epps, Carrie Primary Care Provider: Kriste BasqueKIM, HANNAH Other Clinician: Referring Provider: Treating Provider/Extender:Revecca Nachtigal, Buzzy HanMichael [name], Perlie MayoHANNAH Weeks in Treatment: 5 Constitutional Patient is hypertensive.. Pulse regular and within target range for  patient.Marland Kitchen Respirations regular, non-labored and within target range.. Temperature is normal  and within the target range for the patient.Marland Kitchen Appears in no distress. Respiratory work of breathing is normal. Cardiovascular Pedal pulses are palpable. Integumentary (Hair, Skin) Chronic hemosiderin deposition. Psychiatric appears at normal baseline. Notes Wound exam; the patient had a reopening with a small wound on the left medial lower leg. The right leg is still open anteriorly in the mid tibia area but this is smaller. Equally concerning on the left leg is what appears to be blisters developing anteriorly there is no way she can get away without compression Electronic Signature(s) Signed: 11/11/2018 6:17:56 PM By: Linton Ham MD Entered By: Linton Ham on 11/11/2018 15:28:48 -------------------------------------------------------------------------------- Physician Orders Details Patient Name: Mcclain, [name] Date of Service: 11/11/2018 1:00 PM Medical Record [id number] Patient Account Number: [id number] Date of Birth/Sex: Treating RN: [birth year] (85 y.o. Orvan Falconer Primary Care Provider: Colin Benton Other Clinician: Referring Provider: Treating Provider/Extender:Alesandro Stueve, Rosilyn Mings, Carmela Hurt in Treatment: 5 Verbal / Phone Orders: No Diagnosis Coding ICD-10 Coding Code Description L97.811 Non-pressure chronic ulcer of other part of right lower leg limited to breakdown of skin L97.821 Non-pressure chronic ulcer of other part of left lower leg limited to breakdown of skin I87.323 Chronic venous hypertension (idiopathic) with inflammation of bilateral lower extremity Z92.241 Personal history of systemic steroid therapy Follow-up Appointments Return Appointment in 1 week. Dressing Change Frequency Do not change entire dressing for one week. Skin Barriers/Peri-Wound Care Moisturizing lotion - both legs Primary Wound Dressing Wound #3 Right,Distal Lower Leg Hydrofera Blue - READY Secondary Dressing Wound #3 Right,Distal Lower Leg Dry  Gauze ABD pad Edema Control Kerlix and Coban - Bilateral Avoid standing for long periods of time Elevate legs to the level of the heart or above for 30 minutes daily and/or when sitting, a frequency of: - 3 to 4 times a day. Exercise regularly Electronic Signature(s) Signed: 11/11/2018 6:17:56 PM By: Linton Ham MD Signed: 12/30/2018 3:01:15 PM By: Carlene Coria RN Entered By: Carlene Coria on 11/11/2018 14:48:04 -------------------------------------------------------------------------------- Problem List Details Patient Name: Frankum, Ronnika T. Date of Service: 11/11/2018 1:00 PM Medical Record [id number] Patient Account Number: [id number] Date of Birth/Sex: Treating RN: 1928-09-02 (85 y.o. Orvan Falconer Primary Care Provider: Colin Benton Other Clinician: Referring Provider: Treating Provider/Extender:Janie Strothman, Rosilyn Mings, Carmela Hurt in Treatment: 5 Active Problems ICD-10 Evaluated Encounter Code Description Active Date Today Diagnosis L97.811 Non-pressure chronic ulcer of other part of right lower 10/06/2018 No Yes leg limited to breakdown of skin L97.821 Non-pressure chronic ulcer of other part of left lower 10/06/2018 No Yes leg limited to breakdown of skin I87.323 Chronic venous hypertension (idiopathic) with 10/06/2018 No Yes inflammation of bilateral lower extremity Z92.241 Personal history of systemic steroid therapy 10/06/2018 No Yes Inactive Problems Resolved Problems Electronic Signature(s) Signed: 11/11/2018 6:17:56 PM By: Linton Ham MD Entered By: Linton Ham on 11/11/2018 15:19:29 -------------------------------------------------------------------------------- Progress Note Details Patient Name: Coppola, Karista T. Date of Service: 11/11/2018 1:00 PM Medical Record [id number] Patient Account Number: [id number] Date of Birth/Sex: Treating RN: January 16, 1929 (85 y.o. Orvan Falconer Primary Care Provider: Colin Benton Other  Clinician: Referring Provider: Treating Provider/Extender:Emersynn Deatley, Rosilyn Mings, Carmela Hurt in Treatment: 5 Subjective History of Present Illness (HPI) ADMISSION 10/06/2018 This is an 85 year old woman who is here for wounds on her bilateral anterior lower legs. She was previously seen in early 2017 with a wound on her left leg that was traumatic. Felt to have lymphedema and venous insufficiency at that time although  she had a reflux study that did not show significant superficial reflux in the left lower extremity. This healed. At some point she has had stockings prescribed but she cannot get the standard stocking on. The patient is also on chronic prednisone I think related to polymyalgia rheumatica currently at 2.5 mg daily. The patient states that in July she slipped out of bed traumatizing the anterior part of her leg. She was seen by her primary care physicians beginning on 08/14/2018. It was noted to have some erythema and swelling she was given Keflex. On 7/30 she had doxycycline and I think he has had perhaps another round of doxycycline after this. Noted in their notes to have a history of chronic venous insufficiency and some edema. Sometime in August she traumatized her left anterior leg with the tip of her cane and has an open wound in this area as well. She has not been doing anything specific to the wounds themselves leaving them open to air and putting Band-Aids on top. Past medical history; hypertension, polymyalgia rheumatica on chronic prednisone, history of microdiscectomy, B12 deficiency, vitamin D deficiency, emphysema, osteoporosis ABIs in our clinic were 0.96 on the right and 0.99 on the left. 9/22; 2 small wounds on the left anterior and right anterior tibial area. These are better than last week. We put her in 3 layer compression unfortunately she appears to have a new wound on the medial right lower leg anteriorly probably because of wrap injury. She is on chronic  prednisone with a history of chronic polymyalgia rheumatica and has very frail skin on her lower extremities 9/29; 2 small wounds on the left anterior and right anterior tibial areas these continue to improve. The abrasion she had from last week appears to have closed over. 10/6; the patient had 2 wounds on the left anterior and right anterior tibial wounds. The left anterior wound is just about closed. Right anterior tibial is still open. 10/13; the patient's leg is healed on the left as predicted. The area on the right is still open but measuring smaller 10/20; the patient comes in today with a small area on the left medial lower leg. The area on the right is still open but measuring smaller. We had put her in a dual layer stocking last week on the left she is not able to get the 2 layers on she will only wear 1 layer. Objective Constitutional Patient is hypertensive.. Pulse regular and within target range for patient.Marland Kitchen Respirations regular, non-labored and within target range.. Temperature is normal and within the target range for the patient.Marland Kitchen Appears in no distress. Vitals Time Taken: 1:43 PM, Height: 61 in, Weight: 109 lbs, BMI: 20.6, Temperature: 98.3 F, Pulse: 63 bpm, Respiratory Rate: 17 breaths/min, Blood Pressure: 172/76 mmHg. Respiratory work of breathing is normal. Cardiovascular Pedal pulses are palpable. Psychiatric appears at normal baseline. General Notes: Wound exam; the patient had a reopening with a small wound on the left medial lower leg. The right leg is still open anteriorly in the mid tibia area but this is smaller. Equally concerning on the left leg is what appears to be blisters developing anteriorly there is no way she can get away without compression Integumentary (Hair, Skin) Chronic hemosiderin deposition. Wound #3 status is Open. Original cause of wound was Gradually Appeared. The wound is located on the Right,Distal Lower Leg. The wound measures 1.2cm  length x 0.6cm width x 0.1cm depth; 0.565cm^2 area and 0.057cm^3 volume. There is Fat Layer (Subcutaneous Tissue) Exposed  exposed. There is no tunneling or undermining noted. There is a medium amount of serosanguineous drainage noted. The wound margin is flat and intact. There is large (67-100%) red, pink granulation within the wound bed. There is no necrotic tissue within the wound bed. Assessment Active Problems ICD-10 Non-pressure chronic ulcer of other part of right lower leg limited to breakdown of skin Non-pressure chronic ulcer of other part of left lower leg limited to breakdown of skin Chronic venous hypertension (idiopathic) with inflammation of bilateral lower extremity Personal history of systemic steroid therapy Plan Follow-up Appointments: Return Appointment in 1 week. Dressing Change Frequency: Do not change entire dressing for one week. Skin Barriers/Peri-Wound Care: Moisturizing lotion - both legs Primary Wound Dressing: Wound #3 Right,Distal Lower Leg: Hydrofera Blue - READY Secondary Dressing: Wound #3 Right,Distal Lower Leg: Dry Gauze ABD pad Edema Control: Kerlix and Coban - Bilateral Avoid standing for long periods of time Elevate legs to the level of the heart or above for 30 minutes daily and/or when sitting, a frequency of: - 3 to 4 times a day. Exercise regularly 1. Continue with Hydrofera Blue to both open wound areas and kerlix Coban. 2. Very clearly this patient cannot do well with dual layer stockings because she cannot get them on. 3. She has had a rebreakdown on the left leg and really looks fragile anteriorly from what looks like blistering. She is clearly going to need more compression. 4. We dressed both wound areas right and left with Hydrofera Blue put her in Kerlix Coban. We took measurements and ordered bilateral juxta lite stockings with the assistance of her daughter I think we should be able to get these in place Electronic  Signature(s) Signed: 11/11/2018 6:17:56 PM By: Baltazar Najjar MD Entered By: Baltazar Najjar on 11/11/2018 15:30:48 -------------------------------------------------------------------------------- SuperBill Details Patient Name: Date of Service: [name] 11/11/2018 Medical Record [id number] Patient Account Number: [id number] Date of Birth/Sex: Treating RN: 1928/07/19 (85 y.o. [name] Primary Care Provider: [name] Other Clinician: Referring Provider: Treating Provider/Extender:Adair Lauderback, Buzzy [name], Perlie Mayo in Treatment: 5 Diagnosis Coding ICD-10 Codes Code Description [phone] Non-pressure chronic ulcer of other part of right lower leg limited to breakdown of skin L97.821 Non-pressure chronic ulcer of other part of left lower leg limited to breakdown of skin I87.323 Chronic venous hypertension (idiopathic) with inflammation of bilateral lower extremity Z92.241 Personal history of systemic steroid therapy Physician Procedures CPT4 Code Description: [id number] - WC PHYS LEVEL 3 - EST PT ICD-10 Diagnosis Description L97.811 Non-pressure chronic ulcer of other part of right lower le skin L97.821 Non-pressure chronic ulcer of other part of left lower leg skin I87.323 Chronic  venous hypertension (idiopathic) with inflammation extremity Modifier: g limited to breakdo limited to breakdow of bilateral lower Quantity: 1 wn of n of Electronic Signature(s) Signed: 11/11/2018 6:17:56 PM By: Baltazar Najjar MD Entered By: Baltazar Najjar on 11/11/2018 15:31:08

## 2018-12-30 NOTE — Progress Notes (Signed)
[name], [name] ([id number]) Visit Report for 11/04/2018 Arrival Information Details Patient Name: [name], [name] Date of Service: 11/04/2018 12:30 PM Medical Record [id number] Patient Account Number: [id number] Date of Birth/Sex: Treating RN: [birth year] (85 y.o. F) Dwiggins, Shannon Primary Care Fawna Cranmer: [name] Other Clinician: Referring Dalina Samara: Treating Meridith Romick/Extender:Robson, Buzzy [name], Perlie Mayo in Treatment: 4 Visit Information History Since Last Visit Cane Added or deleted any medications: No Patient Arrived: 13:22 Any new allergies or adverse reactions: No Arrival Time: sister Had a fall or experienced change in No Accompanied By: None activities of daily living that may affect Transfer Assistance: risk of falls: Patient Identification Verified: Yes Signs or symptoms of abuse/neglect since last No Secondary Verification Process Completed: Yes visito Patient Requires Transmission-Based No Hospitalized since last visit: No Precautions: Implantable device outside of the clinic excluding No Patient Has Alerts: No cellular tissue based products placed in the center since last visit: Has Dressing in Place as Prescribed: Yes Has Compression in Place as Prescribed: Yes Pain Present Now: No Electronic Signature(s) Signed: 11/05/2018 5:37:20 PM By: Cherylin Mylar Entered By: Cherylin Mylar on 11/04/2018 13:22:30 -------------------------------------------------------------------------------- Clinic Level of Care Assessment Details Patient Name: [name], [name] Date of Service: 11/04/2018 12:30 PM Medical Record [id number] Patient Account Number: [id number] Date of Birth/Sex: Treating RN: 1928-05-16 (85 y.o. [name] Primary Care Aprel Egelhoff: [name] Other Clinician: Referring Jatziri Goffredo: Treating Henok Heacock/Extender:Robson, Buzzy [name], Perlie Mayo in Treatment: 4 Clinic Level of Care Assessment Items TOOL 4  Quantity Score X - Use when only an EandM is performed on FOLLOW-UP visit 1 0 ASSESSMENTS - Nursing Assessment / Reassessment X - Reassessment of Co-morbidities (includes updates in patient status) 1 10 X - Reassessment of Adherence to Treatment Plan 1 5 ASSESSMENTS - Wound and Skin Assessment / Reassessment X - Simple Wound Assessment / Reassessment - one wound 1 5 []  - Complex Wound Assessment / Reassessment - multiple wounds 0 []  - Dermatologic / Skin Assessment (not related to wound area) 0 ASSESSMENTS - Focused Assessment []  - Circumferential Edema Measurements - multi extremities 0 []  - Nutritional Assessment / Counseling / Intervention 0 []  - Lower Extremity Assessment (monofilament, tuning fork, pulses) 0 []  - Peripheral Arterial Disease Assessment (using hand held doppler) 0 ASSESSMENTS - Ostomy and/or Continence Assessment and Care []  - Incontinence Assessment and Management 0 []  - Ostomy Care Assessment and Management (repouching, etc.) 0 PROCESS - Coordination of Care X - Simple Patient / Family Education for ongoing care 1 15 []  - Complex (extensive) Patient / Family Education for ongoing care 0 X - Staff obtains , Records, Test Results / Process Orders 1 10 []  - Staff telephones HHA, Nursing Homes / Clarify orders / etc 0 []  - Routine Transfer to another Facility (non-emergent condition) 0 []  - Routine Hospital Admission (non-emergent condition) 0 []  - New Admissions / / Ordering NPWT, Apligraf, etc. 0 []  - Emergency Hospital Admission (emergent condition) 0 X - Simple Discharge Coordination 1 10 []  - Complex (extensive) Discharge Coordination 0 PROCESS - Special Needs []  - Pediatric / Minor Patient Management 0 []  - Isolation Patient Management 0 []  - Hearing / Language / Visual special needs 0 []  - Assessment of Community assistance (transportation, D/C planning, etc.) 0 []  - Additional assistance / Altered mentation 0 []  - Support  Surface(s) Assessment (bed, cushion, seat, etc.) 0 INTERVENTIONS - Wound Cleansing / Measurement X - Simple Wound Cleansing - one wound 1 5 []  - Complex Wound Cleansing - multiple wounds  0 X - Wound Imaging (photographs - any number of wounds) 1 5  - Wound Tracing (instead of photographs) 0 X - Simple Wound Measurement - one wound 1 5  - Complex Wound Measurement - multiple wounds 0 INTERVENTIONS - Wound Dressings  - Small Wound Dressing one or multiple wounds 0  - Medium Wound Dressing one or multiple wounds 0 X - Large Wound Dressing one or multiple wounds 1 20 X - Application of Medications - topical 1 5  - Application of Medications - injection 0 INTERVENTIONS - Miscellaneous  - External ear exam 0  - Specimen Collection (cultures, biopsies, blood, body fluids, etc.) 0  - Specimen(s) / Culture(s) sent or taken to Lab for analysis 0  - Patient Transfer (multiple staff / Nurse, adult / Similar devices) 0  - Simple Staple / Suture removal (25 or less) 0  - Complex Staple / Suture removal (26 or more) 0  - Hypo / Hyperglycemic Management (close monitor of Blood Glucose) 0  - Ankle / Brachial Index (ABI) - do not check if billed separately 0 X - Vital Signs 1 5 Has the patient been seen at the hospital within the last three years: Yes Total Score: 100 Level Of Care: New/Established - Level 3 Electronic Signature(s) Signed: 12/30/2018 3:01:15 PM By: Yevonne Pax RN Entered By: Yevonne Pax on 11/04/2018 13:49:18 -------------------------------------------------------------------------------- Encounter Discharge Information Details Patient Name: [name], [name] Date of Service: 11/04/2018 12:30 PM Medical Record [id number] Patient Account Number: [id number] Date of Birth/Sex: Treating RN: [birth year] (85 y.o. Wynelle Link Primary Care Messina Kosinski: [name] Other Clinician: Referring Treyana Sturgell: Treating  Antolin/Extender:Robson, Buzzy [name],  Perlie Mayo in Treatment: 4 Encounter Discharge Information Items Discharge Condition: Stable Ambulatory Status: Cane Discharge Destination: Home Transportation: Private Auto Accompanied By: daughter Schedule Follow-up Appointment: Yes Clinical Summary of Care: Patient Declined Electronic Signature(s) Signed: 11/04/2018 5:52:09 PM By: Zandra Abts RN, [name] Entered By: Zandra Abts on 11/04/2018 17:36:08 -------------------------------------------------------------------------------- Lower Extremity Assessment Details Patient Name: [name], [name] Date of Service: 11/04/2018 12:30 PM Medical Record [id number] Patient Account Number: [id number] Date of Birth/Sex: Treating RN: 03-Oct-1928 (85 y.o. Harvest Dark Primary Care Andrey Hoobler: [name] Other Clinician: Referring Sayre Witherington: Treating Fredi Geiler/Extender:Robson, Buzzy [name], Perlie Mayo in Treatment: 4 Edema Assessment Assessed: [Left: No] [Right: No] Edema: [Left: No] [Right: No] Calf Left: Right: Point of Measurement: 34 cm From Medial Instep 27 cm 27 cm Ankle Left: Right: Point of Measurement: 9 cm From Medial Instep 18 cm 18 cm Vascular Assessment Pulses: Dorsalis Pedis Palpable: [Left:Yes] [Right:Yes] Electronic Signature(s) Signed: 11/05/2018 5:37:20 PM By: Cherylin Mylar Signed: 12/30/2018 3:01:15 PM By: Yevonne Pax RN Entered By: Yevonne Pax on 11/04/2018 13:42:14 -------------------------------------------------------------------------------- Multi Wound Chart Details Patient Name: [name], [name] Date of Service: 11/04/2018 12:30 PM Medical Record [id number] Patient Account Number: [id number] Date of Birth/Sex: Treating RN: 26-Dec-1928 (85 y.o. [name] Primary Care Lilygrace Rodick: [name] Other Clinician: Referring Devonda Pequignot: Treating Michelle Wnek/Extender:Robson, Buzzy [name], Perlie Mayo in Treatment: 4 Vital Signs Height(in): 61 Pulse(bpm): 78 Weight(lbs):  109 Blood Pressure(mmHg): 124/88 Body Mass Index(BMI): 21 Temperature(F): 98.0 Respiratory 18 Rate(breaths/min): Photos: [1:No Photos] [3:No Photos] [N/A:N/A] Wound Location: [1:Left Lower Leg - Anterior, Right Lower Leg - Distal Distal] [N/A:N/A] Wounding Event: [1:Trauma] [3:Gradually Appeared] [N/A:N/A] Primary Etiology: [1:Venous Leg Ulcer] [3:Venous Leg Ulcer] [N/A:N/A] Comorbid History: [1:Anemia, Hypertension, Osteoarthritis] [3:Anemia, Hypertension, Osteoarthritis] [N/A:N/A] Date Acquired: [1:09/15/2018] [3:10/14/2018] [N/A:N/A] Weeks of Treatment: [1:4] [3:3] [N/A:N/A] Wound Status: [1:Healed - Epithelialized] [3:Open] [N/A:N/A] Measurements L x W x  D 0x0x0 [3:1.7x0.9x0.1] [N/A:N/A] (cm) Area (cm) : [1:0] [3:1.202] [N/A:N/A] Volume (cm) : [1:0] [3:0.12] [N/A:N/A] % Reduction in Area: [1:100.00%] [3:25.00%] [N/A:N/A] % Reduction in Volume: 100.00% [3:25.00%] [N/A:N/A] Classification: [1:Full Thickness Without Exposed Support Structures Exposed Support Structures] [3:Full Thickness Without] [N/A:N/A] Exudate Amount: [1:None Present] [3:Medium] [N/A:N/A] Exudate Type: [1:N/A] [3:Serosanguineous] [N/A:N/A] Exudate Color: [1:N/A] [3:red, brown] [N/A:N/A] Wound Margin: [1:Flat and Intact] [3:Flat and Intact] [N/A:N/A] Granulation Amount: [1:None Present (0%)] [3:Medium (34-66%)] [N/A:N/A] Granulation Quality: [1:N/A] [3:Red] [N/A:N/A] Necrotic Amount: [1:None Present (0%)] [3:Small (1-33%)] [N/A:N/A] Exposed Structures: [1:Fascia: No Fat Layer (Subcutaneous Tissue) Exposed: No Tendon: No Muscle: No Joint: No Bone: No Large (67-100%)] [3:Fat Layer (Subcutaneous Tissue) Exposed: Yes Fascia: No Tendon: No Muscle: No Joint: No Bone: No Small (1-33%)] [N/A:N/A N/A] Treatment Notes Electronic Signature(s) Signed: 11/04/2018 5:42:33 PM By: Baltazar Najjarobson, Michael MD Signed: 12/30/2018 3:01:15 PM By: Yevonne PaxEpps, Carrie RN Entered By: Baltazar Najjarobson, Michael on 11/04/2018  14:48:28 -------------------------------------------------------------------------------- Multi-Disciplinary Care Plan Details Patient Name: [name], [name] Date of Service: 11/04/2018 12:30 PM Medical Record [id number] Patient Account Number: [id number] Date of Birth/Sex: Treating RN: 1928/10/04 (85 y.o. Freddy FinnerF) Epps, Carrie Primary Care Shaila Gilchrest: Kriste BasqueKIM, HANNAH Other Clinician: Referring Navy Belay: Treating Roxan Yamamoto/Extender:Robson, Buzzy HanMichael [name], Perlie MayoHANNAH Weeks in Treatment: 4 Active Inactive Wound/Skin Impairment Nursing Diagnoses: Knowledge deficit related to ulceration/compromised skin integrity Goals: Patient/caregiver will verbalize understanding of skin care regimen Date Initiated: 10/06/2018 Target Resolution Date: 11/13/2018 Goal Status: Active Interventions: Assess patient/caregiver ability to obtain necessary supplies Assess patient/caregiver ability to perform ulcer/skin care regimen upon admission and as needed Provide education on ulcer and skin care Treatment Activities: Skin care regimen initiated : 10/06/2018 Topical wound management initiated : 10/06/2018 Notes: Electronic Signature(s) Signed: 12/30/2018 3:01:15 PM By: Yevonne PaxEpps, Carrie RN Entered By: Yevonne PaxEpps, Carrie on 11/04/2018 13:24:01 -------------------------------------------------------------------------------- Pain Assessment Details Patient Name: [name], [name] Date of Service: 11/04/2018 12:30 PM Medical Record [id number] Patient Account Number: [id number] Date of Birth/Sex: Treating RN: 1928/10/04 (85 y.o. [name]) Dwiggins, Shannon Primary Care Mcguire Gasparyan: Kriste BasqueKIM, HANNAH Other Clinician: Referring Shanautica Forker: Treating Eldean Klatt/Extender:Robson, Buzzy HanMichael [name], Perlie MayoHANNAH Weeks in Treatment: 4 Active Problems Location of Pain Severity and Description of Pain Patient Has Paino No Site Locations Pain Management and Medication Current Pain Management: Electronic Signature(s) Signed: 11/05/2018 5:37:20 PM By:  Cherylin Mylarwiggins, Shannon Entered By: Cherylin Mylarwiggins, Shannon on 11/04/2018 13:23:10 -------------------------------------------------------------------------------- Patient/Caregiver Education Details [name], [name] 10/13/2020andnbsp12:30 Patient Name: Date of Service: T. PM Medical Record Patient Account Number: [id number] Number: Treating RN: Yevonne PaxEpps, Carrie Date of Birth/Gender: 1928/10/04 (85 y.o. F) Other Clinician: Primary Care Treating [name], Tawni LevyHANNAH Robson, Michael Physician: Physician/Extender: Referring Physician: Kriste BasqueKIM, HANNAH Referring Physician: Tonye PearsonKIM, HANNAH Weeks in Treatment: 4 Education Assessment Education Provided To: Patient Education Topics Provided Wound/Skin Impairment: Methods: Explain/Verbal Responses: State content correctly Electronic Signature(s) Signed: 12/30/2018 3:01:15 PM By: Yevonne PaxEpps, Carrie RN Entered By: Yevonne PaxEpps, Carrie on 11/04/2018 13:24:25 -------------------------------------------------------------------------------- Wound Assessment Details Patient Name: [name], [name] Date of Service: 11/04/2018 12:30 PM Medical Record [id number] Patient Account Number: [id number] Date of Birth/Sex: Treating RN: 1928/10/04 ([age] y.o. [name]) Dwiggins, Shannon Primary Care Chares Slaymaker: Kriste BasqueKIM, HANNAH Other Clinician: Referring Neftaly Inzunza: Treating Cheila Wickstrom/Extender:Robson, Buzzy HanMichael [name], Perlie MayoHANNAH Weeks in Treatment: 4 Wound Status Wound Number: 1 Primary Etiology: Venous Leg Ulcer Wound Location: Left Lower Leg - Anterior, Distal Wound Status: Healed - Epithelialized Wounding Event: Trauma Comorbid History: Anemia, Hypertension, Osteoarthritis Date Acquired: 09/15/2018 Weeks Of Treatment: 4 Clustered Wound: No Photos Wound Measurements Length: (cm) 0 % Reduc Width: (cm) 0 % Reduc Depth: (cm) 0 Epithel Area: (cm) 0 Tunnel Volume: (cm)  0 Underm Wound Description Full Thickness Without Exposed Support Foul Odo Classification: Structures Slough/F Wound Flat  and Intact Margin: Exudate None Present Amount: Wound Bed Granulation Amount: None Present (0%) Necrotic Amount: None Present (0%) Fascia E Fat Laye Tendon E Muscle E Joint Ex Bone Exp r After Cleansing: No ibrino No Exposed Structure xposed: No r (Subcutaneous Tissue) Exposed: No xposed: No xposed: No posed: No osed: No tion in Area: 100% tion in Volume: 100% ialization: Large (67-100%) ing: No ining: No Electronic Signature(s) Signed: 11/05/2018 5:37:20 PM By: Kela Millin Signed: 11/26/2018 4:01:42 PM By: Mikeal Hawthorne EMT/HBOT Entered By: Mikeal Hawthorne on 11/05/2018 08:58:41 -------------------------------------------------------------------------------- Wound Assessment Details Patient Name: [name], [name] Date of Service: 11/04/2018 12:30 PM Medical Record [id number] Patient Account Number: [id number] Date of Birth/Sex: Treating RN: 1928/05/21 (85 y.o. Clearnce Sorrel Primary Care Braelynn Benning: Colin Benton Other Clinician: Referring Iridiana Fonner: Treating Zechariah Bissonnette/Extender:Robson, Rosilyn Mings, Carmela Hurt in Treatment: 4 Wound Status Wound Number: 3 Primary Etiology: Venous Leg Ulcer Wound Location: Right Lower Leg - Distal Wound Status: Open Wounding Event: Gradually Appeared Comorbid History: Anemia, Hypertension, Osteoarthritis Date Acquired: 10/14/2018 Weeks Of Treatment: 3 Clustered Wound: No Photos Wound Measurements Length: (cm) 1.7 % Reduct Width: (cm) 0.9 % Reduct Depth: (cm) 0.1 Epitheli Area: (cm) 1.202 Tunneli Volume: (cm) 0.12 Undermi Wound Description Classification: Full Thickness Without Exposed Support Foul Odo Structures Slough/F Wound Flat and Intact Margin: Exudate Medium Amount: Exudate Serosanguineous Type: Exudate red, brown Color: Wound Bed Granulation Amount: Medium (34-66%) Granulation Quality: Red Fascia E Necrotic Amount: Small (1-33%) Fat Laye Necrotic Quality: Adherent Slough Tendon  E Muscle E Joint Ex Bone Exp r After Cleansing: No ibrino Yes Exposed Structure xposed: No r (Subcutaneous Tissue) Exposed: Yes xposed: No xposed: No posed: No osed: No ion in Area: 25% ion in Volume: 25% alization: Small (1-33%) ng: No ning: No Electronic Signature(s) Signed: 11/05/2018 5:37:20 PM By: Kela Millin Signed: 11/26/2018 4:01:42 PM By: Mikeal Hawthorne EMT/HBOT Entered By: Mikeal Hawthorne on 11/05/2018 08:58:21 -------------------------------------------------------------------------------- Vitals Details Patient Name: [name], [name] Date of Service: 11/04/2018 12:30 PM Medical Record [id number] Patient Account Number: [id number] Date of Birth/Sex: Treating RN: 07-07-1928 (85 y.o. Clearnce Sorrel Primary Care Lanise Mergen: Colin Benton Other Clinician: Referring Shea Swalley: Treating Kayleanna Lorman/Extender:Robson, Rosilyn Mings, Carmela Hurt in Treatment: 4 Vital Signs Time Taken: 13:15 Temperature (F): 98.0 Height (in): 61 Pulse (bpm): 78 Weight (lbs): 109 Respiratory Rate (breaths/min): 18 Body Mass Index (BMI): 20.6 Blood Pressure (mmHg): 124/88 Reference Range: 80 - 120 mg / dl Electronic Signature(s) Signed: 11/05/2018 5:37:20 PM By: Kela Millin Entered By: Kela Millin on 11/04/2018 13:22:59

## 2018-12-30 NOTE — Telephone Encounter (Signed)
I called the pt and she stated she went to have the test.

## 2018-12-30 NOTE — Progress Notes (Signed)
[name], [name] ([id number]) Visit Report for 11/18/2018 Arrival Information Details Patient Name: Date of Service: [name], [name] 11/18/2018 1:45 PM Medical Record [id number] Patient Account Number: [id number] Date of Birth/Sex: Treating RN: 01/01/1929 (85 y.o. [name], Millard.Loa Primary Care Miriah Maruyama: [name] Other Clinician: Referring [name]: Treating [name]/Extender:Robson, Buzzy [name], Perlie Mayo in [name]: 6 Visit Information History Since Last Visit Added or deleted any medications: No Patient Arrived: [name] Any new allergies or adverse reactions: No Arrival Time: 14:20 daughter Had a fall or experienced change in No Accompanied By: activities of daily living that may affect Transfer Assistance: None risk of falls: Patient Identification Verified: Yes Signs or symptoms of abuse/neglect since last No Secondary Verification Process Completed: Yes visito Patient Requires Transmission-Based No Hospitalized since last visit: No Precautions: Implantable device outside of the clinic excluding No Patient Has Alerts: No cellular tissue based products placed in the center since last visit: Has Dressing in Place as Prescribed: Yes Has Compression in Place as Prescribed: Yes Pain Present Now: No Electronic Signature(s) Signed: 11/18/2018 5:02:12 PM By: Shawn Stall Entered By: Shawn Stall on 11/18/2018 14:32:13 -------------------------------------------------------------------------------- Clinic Level of Care Assessment Details Patient Name: Date of Service: [name] 11/18/2018 1:45 PM Medical Record [id number] Patient Account Number: [id number] Date of Birth/Sex: Treating RN: 01/26/1928 (85 y.o. [name] Primary Care Georgene Kopper: [name] Other Clinician: Referring [name]: Treating [name]/Extender:Robson, Buzzy [name], Perlie Mayo in [name]: 6 Clinic Level of Care Assessment Items TOOL 4 Quantity  Score X - Use when only an EandM is performed on FOLLOW-UP visit 1 0 ASSESSMENTS - Nursing Assessment / Reassessment []  - Reassessment of Co-morbidities (includes updates in patient status) 0 []  - Reassessment of Adherence to [name] Plan 0 ASSESSMENTS - Wound and Skin Assessment / Reassessment X - Simple Wound Assessment / Reassessment - one wound 1 5 []  - Complex Wound Assessment / Reassessment - multiple wounds 0 []  - Dermatologic / Skin Assessment (not related to wound area) 0 ASSESSMENTS - Focused Assessment []  - Circumferential Edema Measurements - multi extremities 0 []  - Nutritional Assessment / Counseling / Intervention 0 []  - Lower Extremity Assessment (monofilament, tuning fork, pulses) 0 []  - Peripheral Arterial Disease Assessment (using hand held doppler) 0 ASSESSMENTS - Ostomy and/or Continence Assessment and Care []  - Incontinence Assessment and Management 0 []  - Ostomy Care Assessment and Management (repouching, etc.) 0 PROCESS - Coordination of Care X - Simple Patient / Family Education for ongoing care 1 15 []  - Complex (extensive) Patient / Family Education for ongoing care 0 X - Staff obtains , Records, Test Results / Process Orders 1 10 []  - Staff telephones HHA, Nursing Homes / Clarify orders / etc 0 []  - Routine Transfer to another Facility (non-emergent condition) 0 []  - Routine Hospital Admission (non-emergent condition) 0 []  - New Admissions / / Ordering NPWT, Apligraf, etc. 0 []  - Emergency Hospital Admission (emergent condition) 0 X - Simple Discharge Coordination 1 10 []  - Complex (extensive) Discharge Coordination 0 PROCESS - Special Needs []  - Pediatric / Minor Patient Management 0 []  - Isolation Patient Management 0 []  - Hearing / Language / Visual special needs 0 []  - Assessment of Community assistance (transportation, D/C planning, etc.) 0 []  - Additional assistance / Altered mentation 0 []  - Support Surface(s)  Assessment (bed, cushion, seat, etc.) 0 INTERVENTIONS - Wound Cleansing / Measurement X - Simple Wound Cleansing - one wound 1 5 []  - Complex Wound Cleansing - multiple wounds 0 X -  Wound Imaging (photographs - any number of wounds) 1 5 []  - Wound Tracing (instead of photographs) 0 X - Simple Wound Measurement - one wound 1 5 []  - Complex Wound Measurement - multiple wounds 0 INTERVENTIONS - Wound Dressings []  - Small Wound Dressing one or multiple wounds 0 []  - Medium Wound Dressing one or multiple wounds 0 X - Large Wound Dressing one or multiple wounds 1 20 []  - Application of Medications - topical 0 []  - Application of Medications - injection 0 INTERVENTIONS - Miscellaneous []  - External ear exam 0 []  - Specimen Collection (cultures, biopsies, blood, body fluids, etc.) 0 []  - Specimen(s) / Culture(s) sent or taken to Lab for analysis 0 []  - Patient Transfer (multiple staff / Nurse, adultHoyer Lift / Similar devices) 0 []  - Simple Staple / Suture removal (25 or less) 0 []  - Complex Staple / Suture removal (26 or more) 0 []  - Hypo / Hyperglycemic Management (close monitor of Blood Glucose) 0 []  - Ankle / Brachial Index (ABI) - do not check if billed separately 0 X - Vital Signs 1 5 Has the patient been seen at the hospital within the last three years: Yes Total Score: 80 Level Of Care: New/Established - Level 3 Electronic Signature(s) Signed: 12/30/2018 3:01:15 PM By: Yevonne PaxEpps, Carrie RN Entered By: Yevonne PaxEpps, Carrie on 11/18/2018 15:32:19 -------------------------------------------------------------------------------- Encounter Discharge Information Details Patient Name: Date of Service: [name] LatheEDMONDSON, [name] 11/18/2018 1:45 PM Medical Record [id number] Patient Account Number: [id number] Date of Birth/Sex: Treating RN: [birth year] (85 y.o. [name]) [name], [name] Primary Care Angell Honse: Kriste BasqueKIM, HANNAH Other Clinician: Referring [name]: Treating [name]/Extender:Robson, Buzzy HanMichael [name],  Perlie MayoHANNAH Weeks in [name]: 6 Encounter Discharge Information Items Discharge Condition: Stable Ambulatory Status: Cane Discharge Destination: Home Transportation: Private Auto Accompanied By: daughter Schedule Follow-up Appointment: Yes Clinical Summary of Care: Patient Declined Electronic Signature(s) Signed: 11/18/2018 5:30:52 PM By: Cherylin Mylarwiggins, [name] Entered By: Cherylin Mylarwiggins, [name] on 11/18/2018 16:15:21 -------------------------------------------------------------------------------- Lower Extremity Assessment Details Patient Name: Date of Service: [name] LevansDMONDSON, [name] 11/18/2018 1:45 PM Medical Record [id number] Patient Account Number: [id number] Date of Birth/Sex: Treating RN: [birth year] (85 y.o. Arta SilenceF) Deaton, Bobbi Primary Care Beola Vasallo: Kriste BasqueKIM, HANNAH Other Clinician: Referring Kiyonna Tortorelli: Treating Searra Carnathan/Extender:Robson, Buzzy HanMichael [name], Perlie MayoHANNAH Weeks in [name]: 6 Edema Assessment Assessed: [Left: Yes] [Right: Yes] Edema: [Left: No] [Right: No] Calf Left: Right: Point of Measurement: 34 cm From Medial Instep 27 cm 26 cm Ankle Left: Right: Point of Measurement: 9 cm From Medial Instep 18 cm 18 cm Vascular Assessment Pulses: Dorsalis Pedis Palpable: [Left:Yes] [Right:Yes] Electronic Signature(s) Signed: 11/18/2018 5:02:12 PM By: Shawn Stalleaton, Bobbi Entered By: Shawn Stalleaton, Bobbi on 11/18/2018 14:32:50 -------------------------------------------------------------------------------- Multi Wound Chart Details Patient Name: Date of Service: [name] LatheEDMONDSON, [name] 11/18/2018 1:45 PM Medical Record [id number] Patient Account Number: [id number] Date of Birth/Sex: Treating RN: [birth year] (85 y.o. [name] FinnerF) Epps, Carrie Primary Care Hye Trawick: Kriste BasqueKIM, HANNAH Other Clinician: Referring Leeland Lovelady: Treating Oluwatobiloba Martin/Extender:Robson, Buzzy HanMichael [name], Perlie MayoHANNAH Weeks in [name]: 6 Vital Signs Height(in): 61 Pulse(bpm): 75 Weight(lbs): 109 Blood Pressure(mmHg): 147/74 Body Mass Index(BMI):  21 Temperature(F): 98.5 Respiratory 16 Rate(breaths/min): Photos: [3:No Photos] [N/A:N/A] Wound Location: [3:Right Lower Leg - Anterior, N/A Distal] Wounding Event: [3:Gradually Appeared] [N/A:N/A] Primary Etiology: [3:Venous Leg Ulcer] [N/A:N/A] Comorbid History: [3:Anemia, Hypertension, Osteoarthritis] [N/A:N/A] Date Acquired: [3:10/14/2018] [N/A:N/A] Weeks of [name]: [3:5] [N/A:N/A] Wound Status: [3:Open] [N/A:N/A] Measurements L x W x D 1x0.2x0.1 [N/A:N/A] (cm) Area (cm) : [3:0.157] [N/A:N/A] Volume (cm) : [3:0.016] [N/A:N/A] % Reduction in Area: [3:90.20%] [N/A:N/A] % Reduction in Volume: 90.00% [N/A:N/A] Classification: [3:Full Thickness Without Exposed Support Structures] [  N/A:N/A] Exudate Amount: [3:Medium] [N/A:N/A] Exudate Type: [3:Serosanguineous] [N/A:N/A] Exudate Color: [3:red, brown] [N/A:N/A] Wound Margin: [3:Flat and Intact] [N/A:N/A] Granulation Amount: [3:Large (67-100%)] [N/A:N/A] Granulation Quality: [3:Red, Pink] [N/A:N/A] Necrotic Amount: [3:None Present (0%)] [N/A:N/A] Exposed Structures: [3:Fat Layer (Subcutaneous N/A Tissue) Exposed: Yes Fascia: No Tendon: No Muscle: No Joint: No Bone: No Large (67-100%)] [N/A:N/A] [name] Notes Wound #3 (Right, Distal, Anterior Lower Leg) 1. Cleanse With Wound Cleanser Soap and water 2. Periwound Care Moisturizing lotion 3. Primary Dressing Applied Hydrofera Blue 4. Secondary Dressing Dry Gauze 6. Support Layer Applied Kerlix/Coban Notes stretch net. Juxtalite applied. Patient/daughter educated regarding application, and patient was able to demonstrate application. She is worried she will not be able to place the sock on, daughter is going to order a donner to assist her in placing the sock/stocking on. Electronic Signature(s) Signed: 11/18/2018 6:05:35 PM By: Baltazar Najjar MD Signed: 12/30/2018 3:01:15 PM By: Yevonne Pax RN Entered By: Baltazar Najjar on 11/18/2018  17:34:44 -------------------------------------------------------------------------------- Multi-Disciplinary Care Plan Details Patient Name: Date of Service: [name] Lathe T. 11/18/2018 1:45 PM Medical Record [id number] Patient Account Number: [id number] Date of Birth/Sex: Treating RN: [birth year] (85 y.o. [name] Primary Care Yumiko Alkins: [name] Other Clinician: Referring Josefine Fuhr: Treating Navin Dogan/Extender:Robson, Buzzy [name], Perlie Mayo in [name]: 6 Active Inactive Wound/Skin Impairment Nursing Diagnoses: Knowledge deficit related to ulceration/compromised skin integrity Goals: Patient/caregiver will verbalize understanding of skin care regimen Date Initiated: 10/06/2018 Target Resolution Date: 12/05/2018 Goal Status: Active Interventions: Assess patient/caregiver ability to obtain necessary supplies Assess patient/caregiver ability to perform ulcer/skin care regimen upon admission and as needed Provide education on ulcer and skin care [name] Activities: Skin care regimen initiated : 10/06/2018 Topical wound management initiated : 10/06/2018 Notes: Electronic Signature(s) Signed: 12/30/2018 3:01:15 PM By: Yevonne Pax RN Entered By: Yevonne Pax on 11/18/2018 14:23:26 -------------------------------------------------------------------------------- Pain Assessment Details Patient Name: Date of Service: [name], [name] 11/18/2018 1:45 PM Medical Record [id number] Patient Account Number: [id number] Date of Birth/Sex: Treating RN: Oct 04, 1928 (85 y.o. Arta Silence Primary Care Taaliyah Delpriore: [name] Other Clinician: Referring Jabre Heo: Treating Krayton Wortley/Extender:Robson, Buzzy [name], Perlie Mayo in [name]: 6 Active Problems Location of Pain Severity and Description of Pain Patient Has Paino No Site Locations Rate the pain. Current Pain Level: 0 Pain Management and Medication Current Pain Management: Medication: No Cold  Application: No Rest: No Massage: No Activity: No BowenE.N.S.: No Heat Application: No Leg drop or elevation: No Is the Current Pain Management Adequate: Adequate How does your wound impact your activities of daily livingo Sleep: No Bathing: No Appetite: No Relationship With Others: No Bladder Continence: No Emotions: No Bowel Continence: No Work: No Toileting: No Drive: No Dressing: No Hobbies: No Electronic Signature(s) Signed: 11/18/2018 5:02:12 PM By: Shawn Stall Entered By: Shawn Stall on 11/18/2018 14:32:29 -------------------------------------------------------------------------------- Patient/Caregiver Education Details Cristobal, Chrislyn 10/27/2020andnbsp1:45 Patient Name: Date of Service: T. PM Medical Record Patient Account Number: [id number] Number: Treating RN: Yevonne Pax Date of Birth/Gender: 1928/04/20 (85 y.o. F) Other Clinician: Primary Care Treating [name], Tawni Levy Physician: Physician/Extender: Referring Physician: Tonye Pearson in [name]: 6 Education Assessment Education Provided To: Patient Education Topics Provided Wound/Skin Impairment: Methods: Explain/Verbal Responses: State content correctly Electronic Signature(s) Signed: 12/30/2018 3:01:15 PM By: Yevonne Pax RN Entered By: Yevonne Pax on 11/18/2018 14:23:45 -------------------------------------------------------------------------------- Wound Assessment Details Patient Name: Date of Service: WELDA, AZZARELLO 11/18/2018 1:45 PM Medical Record [id number] Patient Account Number: [id number] Date of Birth/Sex: Treating RN: [birth year] (85 y.o. Arta Silence Primary Care Brazil Voytko: [name] Other Clinician:  Referring Lecretia Buczek: Treating Atha Mcbain/Extender:Robson, Rosilyn Mings, Jarrett Soho Weeks in [name]: 6 Wound Status Wound Number: 3 Primary Etiology: Venous Leg Ulcer Wound Location: Right Lower Leg - Anterior, Distal Wound Status:  Open Wounding Event: Gradually Appeared Comorbid History: Anemia, Hypertension, Osteoarthritis Date Acquired: 10/14/2018 Weeks Of [name]: 5 Clustered Wound: No Photos Wound Measurements Length: (cm) 1 % Reduct Width: (cm) 0.2 % Reduct Depth: (cm) 0.1 Epitheli Area: (cm) 0.157 Tunneli Volume: (cm) 0.016 Undermi Wound Description Classification: Full Thickness Without Exposed Support Foul Odo Structures Slough/F Wound Flat and Intact Margin: Exudate Medium Amount: Exudate Serosanguineous Type: Exudate red, brown Color: Wound Bed Granulation Amount: Large (67-100%) Granulation Quality: Red, Pink Fascia E Necrotic Amount: None Present (0%) Fat Laye Tendon E Muscle E Joint Ex Bone Exp r After Cleansing: No ibrino Yes Exposed Structure xposed: No r (Subcutaneous Tissue) Exposed: Yes xposed: No xposed: No posed: No osed: No ion in Area: 90.2% ion in Volume: 90% alization: Large (67-100%) ng: No ning: No Electronic Signature(s) Signed: 11/20/2018 3:04:52 PM By: Deon Pilling Signed: 11/26/2018 4:01:07 PM By: Mikeal Hawthorne EMT/HBOT Previous Signature: 11/18/2018 5:02:12 PM Version By: Deon Pilling Entered By: Mikeal Hawthorne on 11/20/2018 08:18:48 -------------------------------------------------------------------------------- Vitals Details Patient Name: Date of Service: [name] Few T. 11/18/2018 1:45 PM Medical Record [id number] Patient Account Number: [id number] Date of Birth/Sex: Treating RN: 22-Feb-1928 (85 y.o. Helene Shoe, Tammi Klippel Primary Care Jakayla Schweppe: Colin Benton Other Clinician: Referring Rishawn Walck: Treating Sheridan Gettel/Extender:Robson, Rosilyn Mings, Carmela Hurt in [name]: 6 Vital Signs Time Taken: 14:32 Temperature (F): 98.5 Height (in): 61 Pulse (bpm): 75 Weight (lbs): 109 Respiratory Rate (breaths/min): 16 Body Mass Index (BMI): 20.6 Blood Pressure (mmHg): 147/74 Reference Range: 80 - 120 mg / dl Electronic  Signature(s) Signed: 11/18/2018 5:02:12 PM By: Deon Pilling Entered By: Deon Pilling on 11/18/2018 14:33:08

## 2018-12-30 NOTE — Progress Notes (Signed)
[name], [name] ([id number]) Visit Report for 11/11/2018 Arrival Information Details Patient Name: Date of Service: [name], [name]. 11/11/2018 1:00 [name] Medical Record [id number] Patient Account Number: [id number] Date of Birth/Sex: Treating RN: 08-01-1928 (85 y.o. F) Dwiggins, Shannon Primary Care Montee Tallman: [name] Other Clinician: Referring Shyhiem Beeney: Treating Jessenia Filippone/Extender:Robson, Buzzy [name], Perlie Mayo in Treatment: 5 Visit Information History Since Last Visit Added or deleted any medications: No Patient Arrived: [name] Any new allergies or adverse reactions: No Arrival Time: 13:41 Had a fall or experienced change in No Accompanied By: family activities of daily living that may affect member risk of falls: Transfer Assistance: None Signs or symptoms of abuse/neglect since last No Patient Identification Verified: Yes visito Secondary Verification Process Yes Hospitalized since last visit: No Completed: Implantable device outside of the clinic excluding No Patient Requires Transmission-Based No cellular tissue based products placed in the center Precautions: since last visit: Patient Has Alerts: No Has Dressing in Place as Prescribed: Yes Has Compression in Place as Prescribed: Yes Pain Present Now: No Electronic Signature(s) Signed: 11/11/2018 6:16:57 [name] By: Cherylin Mylar Entered By: Cherylin Mylar on 11/11/2018 13:43:30 -------------------------------------------------------------------------------- Encounter Discharge Information Details Patient Name: Date of Service: [name] Lathe T. 11/11/2018 1:00 [name] Medical Record [id number] Patient Account Number: [id number] Date of Birth/Sex: Treating RN: 04-Jan-1929 (85 y.o. Wynelle Link Primary Care Skylarr Liz: [name] Other Clinician: Referring Lunden Stieber: Treating Rayvin Abid/Extender:Robson, Buzzy [name], Perlie Mayo in Treatment: 5 Encounter Discharge Information  Items Discharge Condition: Stable Ambulatory Status: Cane Discharge Destination: Home Transportation: Private Auto Accompanied By: daughter Schedule Follow-up Appointment: Yes Clinical Summary of Care: Patient Declined Electronic Signature(s) Signed: 11/12/2018 6:50:57 [name] By: Zandra Abts RN, [name] Entered By: Zandra Abts on 11/11/2018 15:50:38 -------------------------------------------------------------------------------- Lower Extremity Assessment Details Patient Name: Date of Service: [name] Lathe T. 11/11/2018 1:00 [name] Medical Record [id number] Patient Account Number: [id number] Date of Birth/Sex: Treating RN: 05/30/1928 (85 y.o. Harvest Dark Primary Care Skyy Mcknight: [name] Other Clinician: Referring Haiden Rawlinson: Treating Alexsander Cavins/Extender:Robson, Buzzy [name], Perlie Mayo in Treatment: 5 Edema Assessment Assessed: [Left: No] [Right: No] Edema: [Left: No] [Right: No] Calf Left: Right: Point of Measurement: 34 cm From Medial Instep 27.2 cm 28 cm Ankle Left: Right: Point of Measurement: 9 cm From Medial Instep 18 cm 18 cm Vascular Assessment Pulses: Dorsalis Pedis Palpable: [Left:Yes] [Right:Yes] Electronic Signature(s) Signed: 11/11/2018 6:16:57 [name] By: Cherylin Mylar Signed: 12/30/2018 3:01:15 [name] By: Yevonne Pax RN Entered By: Yevonne Pax on 11/11/2018 14:38:37 -------------------------------------------------------------------------------- Multi Wound Chart Details Patient Name: Date of Service: [name] Lathe T. 11/11/2018 1:00 [name] Medical Record [id number] Patient Account Number: [id number] Date of Birth/Sex: Treating RN: Jan 18, 1929 (85 y.o. Sharyne Peach, Carrie Primary Care Boyde Grieco: [name] Other Clinician: Referring Briena Swingler: Treating Elona Yinger/Extender:Robson, Buzzy [name], Perlie Mayo in Treatment: 5 Vital Signs Height(in): 61 Pulse(bpm): 63 Weight(lbs): 109 Blood Pressure(mmHg): 172/76 Body Mass Index(BMI):  21 Temperature(F): 98.3 Respiratory 17 Rate(breaths/min): Photos: [3:No Photos] [N/A:N/A] Wound Location: [3:Right Lower Leg - Distal] [N/A:N/A] Wounding Event: [3:Gradually Appeared] [N/A:N/A] Primary Etiology: [3:Venous Leg Ulcer] [N/A:N/A] Comorbid History: [3:Anemia, Hypertension, Osteoarthritis] [N/A:N/A] Date Acquired: [3:10/14/2018] [N/A:N/A] Weeks of Treatment: [3:4] [N/A:N/A] Wound Status: [3:Open] [N/A:N/A] Measurements L x W x D 1.2x0.6x0.1 [N/A:N/A] (cm) Area (cm) : [3:0.565] [N/A:N/A] Volume (cm) : [3:0.057] [N/A:N/A] % Reduction in Area: [3:64.70%] [N/A:N/A] % Reduction in Volume: 64.40% [N/A:N/A] Classification: [3:Full Thickness Without Exposed Support Structures] [N/A:N/A] Exudate Amount: [3:Medium] [N/A:N/A] Exudate Type: [3:Serosanguineous] [N/A:N/A] Exudate Color: [3:red, brown] [N/A:N/A] Wound Margin: [3:Flat and Intact] [N/A:N/A] Granulation Amount: [3:Large (67-100%)] [N/A:N/A] Granulation Quality: [3:Red,  Pink] [N/A:N/A] Necrotic Amount: [3:None Present (0%)] [N/A:N/A] Exposed Structures: [3:Fat Layer (Subcutaneous N/A Tissue) Exposed: Yes Fascia: No Tendon: No Muscle: No Joint: No Bone: No Small (1-33%)] [N/A:N/A] Treatment Notes Electronic Signature(s) Signed: 11/11/2018 6:17:56 [name] By: Linton Ham MD Signed: 12/30/2018 3:01:15 [name] By: Carlene Coria RN Entered By: Linton Ham on 11/11/2018 15:19:34 -------------------------------------------------------------------------------- Multi-Disciplinary Care Plan Details Patient Name: Date of Service: [name] Few T. 11/11/2018 1:00 [name] Medical Record [id number] Patient Account Number: [id number] Date of Birth/Sex: Treating RN: March 17, 1928 (85 y.o. Orvan Falconer Primary Care Kiyan Burmester: Colin Benton Other Clinician: Referring Johncarlo Maalouf: Treating Anderson Coppock/Extender:Robson, Rosilyn Mings, Carmela Hurt in Treatment: 5 Active Inactive Wound/Skin Impairment Nursing Diagnoses: Knowledge deficit  related to ulceration/compromised skin integrity Goals: Patient/caregiver will verbalize understanding of skin care regimen Date Initiated: 10/06/2018 Target Resolution Date: 12/05/2018 Goal Status: Active Interventions: Assess patient/caregiver ability to obtain necessary supplies Assess patient/caregiver ability to perform ulcer/skin care regimen upon admission and as needed Provide education on ulcer and skin care Treatment Activities: Skin care regimen initiated : 10/06/2018 Topical wound management initiated : 10/06/2018 Notes: Electronic Signature(s) Signed: 12/30/2018 3:01:15 [name] By: Carlene Coria RN Entered By: Carlene Coria on 11/11/2018 13:16:36 -------------------------------------------------------------------------------- Pain Assessment Details Patient Name: Date of Service: [name], [name] 11/11/2018 1:00 [name] Medical Record [id number] Patient Account Number: [id number] Date of Birth/Sex: Treating RN: 04-27-1928 (85 y.o. Clearnce Sorrel Primary Care Bertrum Helmstetter: Colin Benton Other Clinician: Referring Denitra Donaghey: Treating Chenise Mulvihill/Extender:Robson, Rosilyn Mings, Carmela Hurt in Treatment: 5 Active Problems Location of Pain Severity and Description of Pain Patient Has Paino No Site Locations Pain Management and Medication Current Pain Management: Electronic Signature(s) Signed: 11/11/2018 6:16:57 [name] By: Kela Millin Entered By: Kela Millin on 11/11/2018 13:44:11 -------------------------------------------------------------------------------- Patient/Caregiver Education Details [name], [name] 10/20/2020andnbsp1:00 Patient Name: Date of Service: T. [name] Medical Record Patient Account Number: [id number] Number: Treating RN: Carlene Coria Date of Birth/Gender: April 22, 1928 (85 y.o. F) Other Clinician: Primary Care Treating [name], Otho Bellows Physician: Physician/Extender: Referring Physician: Jon Gills in Treatment:  5 Education Assessment Education Provided To: Patient Education Topics Provided Wound/Skin Impairment: Methods: Explain/Verbal Responses: State content correctly Electronic Signature(s) Signed: 12/30/2018 3:01:15 [name] By: Carlene Coria RN Entered By: Carlene Coria on 11/11/2018 13:16:54 -------------------------------------------------------------------------------- Wound Assessment Details Patient Name: Date of Service: [name], [name] 11/11/2018 1:00 [name] Medical Record [id number] Patient Account Number: [id number] Date of Birth/Sex: Treating RN: 12-29-1928 (85 y.o. Clearnce Sorrel Primary Care Derrall Hicks: Colin Benton Other Clinician: Referring Keniyah Gelinas: Treating Ginette Bradway/Extender:Robson, Rosilyn Mings, Carmela Hurt in Treatment: 5 Wound Status Wound Number: 3 Primary Etiology: Venous Leg Ulcer Wound Location: Right Lower Leg - Distal Wound Status: Open Wounding Event: Gradually Appeared Comorbid History: Anemia, Hypertension, Osteoarthritis Date Acquired: 10/14/2018 Weeks Of Treatment: 4 Clustered Wound: No Photos Wound Measurements Length: (cm) 1.2 % Reduct Width: (cm) 0.6 % Reduct Depth: (cm) 0.1 Epitheli Area: (cm) 0.565 Tunneli Volume: (cm) 0.057 Undermi Wound Description Classification: Full Thickness Without Exposed Support Foul Odo Structures Slough/F Wound Flat and Intact Margin: Exudate Medium Amount: Exudate Serosanguineous Type: Exudate red, brown Color: Wound Bed Granulation Amount: Large (67-100%) Granulation Quality: Red, Pink Fascia E Necrotic Amount: None Present (0%) Fat Laye Tendon E Muscle E Joint Expo Bone Expos r After Cleansing: No ibrino Yes Exposed Structure xposed: No r (Subcutaneous Tissue) Exposed: Yes xposed: No xposed: No sed: No ed: No ion in Area: 64.7% ion in Volume: 64.4% alization: Small (1-33%) ng: No ning: No Electronic Signature(s) Signed: 11/13/2018 4:25:07 [name] By: Mikeal Hawthorne  EMT/HBOT Signed: 11/17/2018 5:21:50  [name] By: Cherylin Mylarwiggins, Shannon Previous Signature: 11/11/2018 6:16:57 [name] Version By: Cherylin Mylarwiggins, Shannon Entered By: Benjaman KindlerJones, Dedrick on 11/12/2018 10:47:55 -------------------------------------------------------------------------------- Vitals Details Patient Name: Date of Service: [name], [name] 11/11/2018 1:00 [name] Medical Record [id number] Patient Account Number: [id number] Date of Birth/Sex: Treating RN: [birth year] (85 y.o. [name]) Dwiggins, Shannon Primary Care Amadu Schlageter: Kriste BasqueKIM, HANNAH Other Clinician: Referring Lakasha Mcfall: Treating Scotlynn Noyes/Extender:Robson, Buzzy HanMichael [name], Perlie MayoHANNAH Weeks in Treatment: 5 Vital Signs Time Taken: 13:43 Temperature (F): 98.3 Height (in): 61 Pulse (bpm): 63 Weight (lbs): 109 Respiratory Rate (breaths/min): 17 Body Mass Index (BMI): 20.6 Blood Pressure (mmHg): 172/76 Reference Range: 80 - 120 mg / dl Electronic Signature(s) Signed: 11/11/2018 6:16:57 [name] By: Cherylin Mylarwiggins, Shannon Entered By: Cherylin Mylarwiggins, Shannon on 11/11/2018 13:44:06

## 2018-12-30 NOTE — Telephone Encounter (Signed)
Stated she only wanted this for her peace of mind due to her having had a cough and body aches last week.

## 2018-12-30 NOTE — Progress Notes (Signed)
[name], [name] ([id number]) Visit Report for 11/04/2018 HPI Details Patient Name: [name], [name] Date of Service: 11/04/2018 12:30 PM Medical Record [id number] Patient Account Number: [id number] Date of Birth/Sex: Treating RN: August 08, 1928 (85 y.o. [name]) Epps, Carrie Primary Care Provider: Kriste BasqueKIM, HANNAH Other Clinician: Referring Provider: Treating Provider/Extender:Haider Hornaday, Buzzy HanMichael [name], Perlie MayoHANNAH Weeks in Treatment: 4 History of Present Illness HPI Description: ADMISSION 10/06/2018 This is an 85 year old woman who is here for wounds on her bilateral anterior lower legs. She was previously seen in early 2017 with a wound on her left leg that was traumatic. Felt to have lymphedema and venous insufficiency at that time although she had a reflux study that did not show significant superficial reflux in the left lower extremity. This healed. At some point she has had stockings prescribed but she cannot get the standard stocking on. The patient is also on chronic prednisone I think related to polymyalgia rheumatica currently at 2.5 mg daily. The patient states that in July she slipped out of bed traumatizing the anterior part of her leg. She was seen by her primary care physicians beginning on 08/14/2018. It was noted to have some erythema and swelling she was given Keflex. On 7/30 she had doxycycline and I think he has had perhaps another round of doxycycline after this. Noted in their notes to have a history of chronic venous insufficiency and some edema. Sometime in August she traumatized her left anterior leg with the tip of her cane and has an open wound in this area as well. She has not been doing anything specific to the wounds themselves leaving them open to air and putting Band-Aids on top. Past medical history; hypertension, polymyalgia rheumatica on chronic prednisone, history of microdiscectomy, B12 deficiency, vitamin D deficiency, emphysema, osteoporosis ABIs in our clinic  were 0.96 on the right and 0.99 on the left. 9/22; 2 small wounds on the left anterior and right anterior tibial area. These are better than last week. We put her in 3 layer compression unfortunately she appears to have a new wound on the medial right lower leg anteriorly probably because of wrap injury. She is on chronic prednisone with a history of chronic polymyalgia rheumatica and has very frail skin on her lower extremities 9/29; 2 small wounds on the left anterior and right anterior tibial areas these continue to improve. The abrasion she had from last week appears to have closed over. 10/6; the patient had 2 wounds on the left anterior and right anterior tibial wounds. The left anterior wound is just about closed. Right anterior tibial is still open. 10/13; the patient's leg is healed on the left as predicted. The area on the right is still open but measuring smaller Electronic Signature(s) Signed: 11/04/2018 5:42:33 PM By: Baltazar Najjarobson, Shaunessy Dobratz MD Entered By: Baltazar Najjarobson, [place] on 11/04/2018 14:50:41 -------------------------------------------------------------------------------- Physical Exam Details Patient Name: [name], [name] Date of Service: 11/04/2018 12:30 PM Medical Record [id number] Patient Account Number: [id number] Date of Birth/Sex: Treating RN: August 08, 1928 (85 y.o. [name]) Epps, Carrie Primary Care Provider: Kriste BasqueKIM, HANNAH Other Clinician: Referring Provider: Treating Provider/Extender:Denton Derks, Buzzy HanMichael [name], Perlie MayoHANNAH Weeks in Treatment: 4 Constitutional Patient is hypertensive.. Pulse regular and within target range for patient.Marland Kitchen. Respirations regular, non-labored and within target range.. Temperature is normal and within the target range for the patient.Marland Kitchen. Appears in no distress. Respiratory work of breathing is normal. Cardiovascular Hemosiderin deposition in the left lower leg. Edema is well controlled. Lymphatic None palpable in the popliteal area. Integumentary (Hair,  Skin) Hemosiderin deposition in the bilateral  lower legs this may also be a suggestion of prednisone-induced effect. Psychiatric appears at normal baseline. Notes Wound exam; the left leg wound just medial to the tibia is closed. The right leg still is open small oval-shaped wound in the distal anterior tibia. Debrided with Anasept and gauze this has a nice looking surface to it no debridement is required Electronic Signature(s) Signed: 11/04/2018 5:42:33 PM By: Linton Ham MD Entered By: Linton Ham on 11/04/2018 14:52:30 -------------------------------------------------------------------------------- Physician Orders Details Patient Name: Funaro, [name] Date of Service: 11/04/2018 12:30 PM Medical Record [id number] Patient Account Number: [id number] Date of Birth/Sex: Treating RN: 12/01/1928 (85 y.o. Orvan Falconer Primary Care Provider: Colin Benton Other Clinician: Referring Provider: Treating Provider/Extender:Amarria Andreasen, Rosilyn Mings, Carmela Hurt in Treatment: 4 Verbal / Phone Orders: No Diagnosis Coding ICD-10 Coding Code Description L97.811 Non-pressure chronic ulcer of other part of right lower leg limited to breakdown of skin L97.821 Non-pressure chronic ulcer of other part of left lower leg limited to breakdown of skin I87.323 Chronic venous hypertension (idiopathic) with inflammation of bilateral lower extremity Z92.241 Personal history of systemic steroid therapy Follow-up Appointments Return Appointment in 1 week. Dressing Change Frequency Do not change entire dressing for one week. Skin Barriers/Peri-Wound Care Moisturizing lotion - both legs Primary Wound Dressing Wound #3 Right,Distal Lower Leg Hydrofera Blue - READY Secondary Dressing Wound #3 Right,Distal Lower Leg Dry Gauze ABD pad Edema Control Kerlix and Coban - Right Lower Extremity Avoid standing for long periods of time Elevate legs to the level of the heart or above for 30  minutes daily and/or when sitting, a frequency of: - 3 to 4 times a day. Exercise regularly Support Garment 20-30 mm/Hg pressure to: - left lower leg Electronic Signature(s) Signed: 11/04/2018 5:42:33 PM By: Linton Ham MD Signed: 12/30/2018 3:01:15 PM By: Carlene Coria RN Entered By: Carlene Coria on 11/04/2018 13:41:14 -------------------------------------------------------------------------------- Problem List Details Patient Name: Kemmerer, Eveleigh T. Date of Service: 11/04/2018 12:30 PM Medical Record [id number] Patient Account Number: [id number] Date of Birth/Sex: Treating RN: 1928/03/30 (85 y.o. Orvan Falconer Primary Care Provider: Colin Benton Other Clinician: Referring Provider: Treating Provider/Extender:Cylie Dor, Rosilyn Mings, Carmela Hurt in Treatment: 4 Active Problems ICD-10 Evaluated Encounter Code Description Active Date Today Diagnosis L97.811 Non-pressure chronic ulcer of other part of right lower 10/06/2018 No Yes leg limited to breakdown of skin L97.821 Non-pressure chronic ulcer of other part of left lower 10/06/2018 No Yes leg limited to breakdown of skin I87.323 Chronic venous hypertension (idiopathic) with 10/06/2018 No Yes inflammation of bilateral lower extremity Z92.241 Personal history of systemic steroid therapy 10/06/2018 No Yes Inactive Problems Resolved Problems Electronic Signature(s) Signed: 11/04/2018 5:42:33 PM By: Linton Ham MD Entered By: Linton Ham on 11/04/2018 14:48:20 -------------------------------------------------------------------------------- Progress Note Details Patient Name: Kelnhofer, Suleyma T. Date of Service: 11/04/2018 12:30 PM Medical Record [id number] Patient Account Number: [id number] Date of Birth/Sex: Treating RN: [birth year] (85 y.o. Orvan Falconer Primary Care Provider: Colin Benton Other Clinician: Referring Provider: Treating Provider/Extender:Troyce Febo, Rosilyn Mings, Carmela Hurt in Treatment:  4 Subjective History of Present Illness (HPI) ADMISSION 10/06/2018 This is an 85 year old woman who is here for wounds on her bilateral anterior lower legs. She was previously seen in early 2017 with a wound on her left leg that was traumatic. Felt to have lymphedema and venous insufficiency at that time although she had a reflux study that did not show significant superficial reflux in the left lower extremity. This healed. At some point she has had stockings prescribed but she cannot get  the standard stocking on. The patient is also on chronic prednisone I think related to polymyalgia rheumatica currently at 2.5 mg daily. The patient states that in July she slipped out of bed traumatizing the anterior part of her leg. She was seen by her primary care physicians beginning on 08/14/2018. It was noted to have some erythema and swelling she was given Keflex. On 7/30 she had doxycycline and I think he has had perhaps another round of doxycycline after this. Noted in their notes to have a history of chronic venous insufficiency and some edema. Sometime in August she traumatized her left anterior leg with the tip of her cane and has an open wound in this area as well. She has not been doing anything specific to the wounds themselves leaving them open to air and putting Band-Aids on top. Past medical history; hypertension, polymyalgia rheumatica on chronic prednisone, history of microdiscectomy, B12 deficiency, vitamin D deficiency, emphysema, osteoporosis ABIs in our clinic were 0.96 on the right and 0.99 on the left. 9/22; 2 small wounds on the left anterior and right anterior tibial area. These are better than last week. We put her in 3 layer compression unfortunately she appears to have a new wound on the medial right lower leg anteriorly probably because of wrap injury. She is on chronic prednisone with a history of chronic polymyalgia rheumatica and has very frail skin on her lower  extremities 9/29; 2 small wounds on the left anterior and right anterior tibial areas these continue to improve. The abrasion she had from last week appears to have closed over. 10/6; the patient had 2 wounds on the left anterior and right anterior tibial wounds. The left anterior wound is just about closed. Right anterior tibial is still open. 10/13; the patient's leg is healed on the left as predicted. The area on the right is still open but measuring smaller Objective Constitutional Patient is hypertensive.. Pulse regular and within target range for patient.Marland Kitchen Respirations regular, non-labored and within target range.. Temperature is normal and within the target range for the patient.Marland Kitchen Appears in no distress. Vitals Time Taken: 1:15 PM, Height: 61 in, Weight: 109 lbs, BMI: 20.6, Temperature: 98.0 F, Pulse: 78 bpm, Respiratory Rate: 18 breaths/min, Blood Pressure: 124/88 mmHg. Respiratory work of breathing is normal. Cardiovascular Hemosiderin deposition in the left lower leg. Edema is well controlled. Lymphatic None palpable in the popliteal area. Psychiatric appears at normal baseline. General Notes: Wound exam; the left leg wound just medial to the tibia is closed. The right leg still is open small oval-shaped wound in the distal anterior tibia. Debrided with Anasept and gauze this has a nice looking surface to it no debridement is required Integumentary (Hair, Skin) Hemosiderin deposition in the bilateral lower legs this may also be a suggestion of prednisone-induced effect. Wound #1 status is Healed - Epithelialized. Original cause of wound was Trauma. The wound is located on the [facility] Lower Leg. The wound measures 0cm length x 0cm width x 0cm depth; 0cm^2 area and 0cm^3 volume. There is no tunneling or undermining noted. There is a none present amount of drainage noted. The wound margin is flat and intact. There is no granulation within the wound bed. There is no  necrotic tissue within the wound bed. Wound #3 status is Open. Original cause of wound was Gradually Appeared. The wound is located on the Right,Distal Lower Leg. The wound measures 1.7cm length x 0.9cm width x 0.1cm depth; 1.202cm^2 area and 0.12cm^3 volume. There is Fat  Layer (Subcutaneous Tissue) Exposed exposed. There is no tunneling or undermining noted. There is a medium amount of serosanguineous drainage noted. The wound margin is flat and intact. There is medium (34-66%) red granulation within the wound bed. There is a small (1-33%) amount of necrotic tissue within the wound bed including Adherent Slough. Assessment Active Problems ICD-10 Non-pressure chronic ulcer of other part of right lower leg limited to breakdown of skin Non-pressure chronic ulcer of other part of left lower leg limited to breakdown of skin Chronic venous hypertension (idiopathic) with inflammation of bilateral lower extremity Personal history of systemic steroid therapy Plan Follow-up Appointments: Return Appointment in 1 week. Dressing Change Frequency: Do not change entire dressing for one week. Skin Barriers/Peri-Wound Care: Moisturizing lotion - both legs Primary Wound Dressing: Wound #3 Right,Distal Lower Leg: Hydrofera Blue - READY Secondary Dressing: Wound #3 Right,Distal Lower Leg: Dry Gauze ABD pad Edema Control: Kerlix and Coban - Right Lower Extremity Avoid standing for long periods of time Elevate legs to the level of the heart or above for 30 minutes daily and/or when sitting, a frequency of: - 3 to 4 times a day. Exercise regularly Support Garment 20-30 mm/Hg pressure to: - left lower leg 1. I am continue with Hydrofera Blue on the right with Kerlix Coban. Continues to make improvements. 2. The patient has support stockings that she brought in today I am going to use these on the left leg. More aggressive compression may be necessary Electronic Signature(s) Signed: 11/04/2018  5:42:33 PM By: Baltazar Najjar MD Entered By: Baltazar Najjar on 11/04/2018 14:53:51 -------------------------------------------------------------------------------- SuperBill Details Patient Name: Date of Service: [name]. 11/04/2018 Medical Record [id number] Patient Account Number: [id number] Date of Birth/Sex: Treating RN: 01/30/1928 (85 y.o. [name] Finner Primary Care Provider: [name] Other Clinician: Referring Provider: Treating Provider/Extender:Vivi Piccirilli, Buzzy [name], Perlie Mayo in Treatment: 4 Diagnosis Coding ICD-10 Codes Code Description [phone] Non-pressure chronic ulcer of other part of right lower leg limited to breakdown of skin L97.821 Non-pressure chronic ulcer of other part of left lower leg limited to breakdown of skin I87.323 Chronic venous hypertension (idiopathic) with inflammation of bilateral lower extremity Z92.241 Personal history of systemic steroid therapy Facility Procedures CPT4 Code: [id number] Description: 99213 - WOUND CARE VISIT-LEV 3 EST PT Modifier: Quantity: 1 Physician Procedures CPT4 Code Description: [id number] 99213 - WC PHYS LEVEL 3 - EST PT ICD-10 Diagnosis Description L97.811 Non-pressure chronic ulcer of other part of right lower le skin L97.821 Non-pressure chronic ulcer of other part of left lower leg skin Modifier: g limited to breakdo limited to breakdow Quantity: 1 wn of n of Electronic Signature(s) Signed: 11/04/2018 5:42:33 PM By: Baltazar Najjar MD Entered By: Baltazar Najjar on 11/04/2018 14:54:08

## 2019-01-01 LAB — NOVEL CORONAVIRUS, NAA: SARS-CoV-2, NAA: NOT DETECTED

## 2019-02-08 ENCOUNTER — Ambulatory Visit: Payer: Medicare Other | Attending: Internal Medicine

## 2019-02-08 DIAGNOSIS — Z23 Encounter for immunization: Secondary | ICD-10-CM | POA: Insufficient documentation

## 2019-02-08 NOTE — Progress Notes (Signed)
   Covid-19 Vaccination Clinic  Name:  [name]    MRN: [id number] DOB: 1928-04-06  02/08/2019  [name] was observed post Covid-19 immunization for 15 minutes without incidence. She was provided with Vaccine Information Sheet and instruction to access the V-Safe system.   [name] was instructed to call 911 with any severe reactions post vaccine: Marland Kitchen Difficulty breathing  . Swelling of your face and throat  . A fast heartbeat  . A bad rash all over your body  . Dizziness and weakness     12:34

## 2019-02-20 ENCOUNTER — Other Ambulatory Visit: Payer: Self-pay | Admitting: Family Medicine

## 2019-02-20 ENCOUNTER — Telehealth: Payer: Self-pay | Admitting: Family Medicine

## 2019-02-20 NOTE — Telephone Encounter (Signed)
Pt is needing her medication Prednisone 2.5 MG refilled Pharmacy: [facility] [address] [place], [place], [place] [address] Pt can be reached at [phone]

## 2019-02-24 NOTE — Telephone Encounter (Signed)
No answer at the pts cell number-will attempt to call back.  Someone at the home number listed stated I had the wrong number.

## 2019-02-24 NOTE — Telephone Encounter (Signed)
I haven't seen her in some time - can they do phone or virtual visit with me today and then can refill?

## 2019-02-24 NOTE — Telephone Encounter (Signed)
I called the pt and informed her of the message below.  Patient stated a refill came to her home Saturday in the mail and an appt was scheduled for 2/9 at 3pm.

## 2019-03-02 ENCOUNTER — Ambulatory Visit: Payer: Medicare Other | Attending: Internal Medicine

## 2019-03-02 DIAGNOSIS — Z23 Encounter for immunization: Secondary | ICD-10-CM

## 2019-03-02 NOTE — Progress Notes (Signed)
   Covid-19 Vaccination Clinic  Name:  LAELYN BLUMENTHAL    MRN: [id number] DOB: 1928/02/25  03/02/2019  Ms. Rottmann was observed post Covid-19 immunization for 15 minutes without incidence. She was provided with Vaccine Information Sheet and instruction to access the V-Safe system.   Ms. Salameh was instructed to call 911 with any severe reactions post vaccine: Marland Kitchen Difficulty breathing  . Swelling of your face and throat  . A fast heartbeat  . A bad rash all over your body  . Dizziness and weakness    Immunizations Administered    Name Date Dose VIS Date Route   Pfizer COVID-19 Vaccine 03/02/2019 12:15 PM 0.3 mL 01/02/2019 Intramuscular   Manufacturer: [employer], [employer]   Lot: [id number]   NDC: [address]-5

## 2019-03-03 ENCOUNTER — Other Ambulatory Visit: Payer: Self-pay

## 2019-03-03 ENCOUNTER — Telehealth: Payer: Self-pay | Admitting: Family Medicine

## 2019-03-03 ENCOUNTER — Telehealth (INDEPENDENT_AMBULATORY_CARE_PROVIDER_SITE_OTHER): Payer: Medicare Other | Admitting: Family Medicine

## 2019-03-03 DIAGNOSIS — Z7189 Other specified counseling: Secondary | ICD-10-CM

## 2019-03-03 DIAGNOSIS — M353 Polymyalgia rheumatica: Secondary | ICD-10-CM | POA: Diagnosis not present

## 2019-03-03 DIAGNOSIS — M810 Age-related osteoporosis without current pathological fracture: Secondary | ICD-10-CM

## 2019-03-03 DIAGNOSIS — D510 Vitamin B12 deficiency anemia due to intrinsic factor deficiency: Secondary | ICD-10-CM | POA: Diagnosis not present

## 2019-03-03 DIAGNOSIS — Z7185 Encounter for immunization safety counseling: Secondary | ICD-10-CM

## 2019-03-03 NOTE — Telephone Encounter (Signed)
-----   Message from Terressa Koyanagi, DO sent at 03/03/2019  4:15 PM EST ----- Please schedule AWV with me and follow up with Dr. Hassan Rowan in about 4-6 months. Thanks.

## 2019-03-03 NOTE — Progress Notes (Signed)
Virtual Visit via Video Note  I connected with [name]  on 03/03/19 at  3:00 PM EST by a video enabled telemedicine application and verified that I am speaking with the correct person using two identifiers.  Location patient: home Location provider:work or home office Persons participating in the virtual visit: patient, provider  I discussed the limitations of evaluation and management by telemedicine and the availability of in person appointments. The patient expressed understanding and agreed to proceed.   HPI:  Seen in follow up. Has a PMH PMR (used to see rheumatologist, on longterm very low dose prednisone), hx pernicious anemia, hx osteoporosis. She is requesting refills of her prednisone. Reports doing well for the most part. She is afriad to ever try stopping the prednisone as did not do well in the past when tried to stop and does well on a low dose. Reports legs have been doing well - has hx wounds and poor healing. Continues fosamax and tolerates well and wishes to continue. Does not want to come into office for repeat dexa or labs during the pandemic. Due for dexa but declines for now. Denies falls, wounds, PMR symptoms.  She had her first COVID19 vaccine yesterday. Got sick last night but symptoms are gone now - had chills and body aches. That was her second dose. Has some ?s about the vaccine.  ROS: See pertinent positives and negatives per HPI.  Past Medical History:  Diagnosis Date  . Alcohol abuse   . Emphysema of lung (HCC) 01/26/2009   pt. unsure of this reports as of 07/31/2012  . Fracture of fifth metatarsal bone of right foot 08/29/2012  . Gout 08/11/2012  . Hyperlipemia 03/27/2007  . Hypertension   . Osteoporosis 07/12/2006  . Pernicious anemia 10/25/2006  . Polymyalgia rheumatica (HCC) 03/27/2007   sees Dr. Kellie Simmering in rheumatology  . Vitamin D deficiency 07/12/2006    Past Surgical History:  Procedure Laterality Date  . APPENDECTOMY  07/12/2006  . LUMBAR  LAMINECTOMY/DECOMPRESSION MICRODISCECTOMY Right 02/13/2017   Procedure: Microdiscectomy - right - Lumbar three-Lumbar four;  Surgeon: Tia Alert, MD;  Location: [facility] OR;  Service: Neurosurgery;  Laterality: Right;  . PARTIAL HIP ARTHROPLASTY     left side- 2x's ( 2nd surgery to correct 1st replacement), R side -   . TOOTH EXTRACTION N/A 08/04/2012   Procedure: EXTRACTIONS 17, 20;  Surgeon: Georgia Lopes, DDS;  Location: [facility] OR;  Service: Oral Surgery;  Laterality: N/A;    Family History  Problem Relation Age of Onset  . Mental illness Mother        alzheimer dementia  . Ulcers Father   . Heart disease Father   . Cancer Father        lung, smoker    SOCIAL HX: see hpi   Current Outpatient Medications:  .  alendronate (FOSAMAX) 70 MG tablet, TAKE 1 TABLET BY MOUTH  WEEKLY WITH 8 OZ OF PLAIN  WATER 30 MINUTES BEFORE  FIRST FOOD, DRINK OR MEDS.  STAY UPRIGHT FOR 30 MINS, Disp: 12 tablet, Rfl: 1 .  Cyanocobalamin (VITAMIN B-12) 1000 MCG SUBL, Place 1,000 mcg under the tongue daily. , Disp: , Rfl:  .  diphenhydrAMINE (BENADRYL) 25 MG tablet, Take 25 mg by mouth at bedtime as needed for sleep., Disp: , Rfl:  .  furosemide (LASIX) 20 MG tablet, Take 1 tablet (20 mg total) by mouth daily as needed., Disp: 30 tablet, Rfl: 0 .  Oyster Shell (OYSTER CALCIUM) 500 MG TABS tablet,  Take 1,000 mg of elemental calcium by mouth daily., Disp: , Rfl:  .  predniSONE (DELTASONE) 2.5 MG tablet, Take 1 tablet by mouth once daily with breakfast, Disp: 90 tablet, Rfl: 0 .  vitamin C (ASCORBIC ACID) 250 MG tablet, Take 500 mg by mouth daily., Disp: , Rfl:   EXAM:  VITALS per patient if applicable:  GENERAL: alert, oriented, appears well and in no acute distress  HEENT: atraumatic, conjunttiva clear, no obvious abnormalities on inspection of external nose and ears  NECK: normal movements of the head and neck  LUNGS: on inspection no signs of respiratory distress, breathing rate appears normal, no obvious  gross SOB, gasping or wheezing  CV: no obvious cyanosis  SKIN: some edema ankles, no rash or wounds on legs on limited video exam  MS: moves all visible extremities without noticeable abnormality  PSYCH/NEURO: pleasant and cooperative, no obvious depression or anxiety, speech and thought processing grossly intact  ASSESSMENT AND PLAN:  Discussed the following assessment and plan:  POLYMYALGIA RHEUMATICA  Pernicious anemia  Osteoporosis without current pathological fracture, unspecified osteoporosis type  Vaccine counseling  -we discussed possible serious and likely etiologies, options for evaluation and workup, limitations of telemedicine visit vs in person visit, treatment, treatment risks and precautions. Prednisone refill was sent already. Answered vaccine questions as able per limited data at this point. Discussed fosamax and osteoporosis. She declines labs or DEXA due to pandemic. Advised follow up/AWV in 4-6 months. Sent message to schedulers. Follow up sooner as needed.   I discussed the assessment and treatment plan with the patient. The patient was provided an opportunity to ask questions and all were answered. The patient agreed with the plan and demonstrated an understanding of the instructions.   The patient was advised to call back or seek an in-person evaluation if the symptoms worsen or if the condition fails to improve as anticipated.   Lucretia Kern, DO

## 2019-03-03 NOTE — Telephone Encounter (Signed)
Called to sch AWV with Selena Batten and Follow up appt with [place] per Selena Batten note. Left VM to call back

## 2019-05-01 ENCOUNTER — Other Ambulatory Visit: Payer: Self-pay | Admitting: Family Medicine

## 2019-06-29 DIAGNOSIS — Z961 Presence of intraocular lens: Secondary | ICD-10-CM | POA: Diagnosis not present

## 2019-06-29 DIAGNOSIS — H35373 Puckering of macula, bilateral: Secondary | ICD-10-CM | POA: Diagnosis not present

## 2019-06-29 DIAGNOSIS — H5202 Hypermetropia, left eye: Secondary | ICD-10-CM | POA: Diagnosis not present

## 2019-06-29 DIAGNOSIS — H26492 Other secondary cataract, left eye: Secondary | ICD-10-CM | POA: Diagnosis not present

## 2019-07-14 DIAGNOSIS — H26492 Other secondary cataract, left eye: Secondary | ICD-10-CM | POA: Diagnosis not present

## 2019-08-05 ENCOUNTER — Telehealth: Payer: Self-pay | Admitting: *Deleted

## 2019-08-05 ENCOUNTER — Other Ambulatory Visit: Payer: Self-pay | Admitting: Family Medicine

## 2019-08-05 MED ORDER — ALENDRONATE SODIUM 70 MG PO TABS: ORAL_TABLET | ORAL | 3 refills | Status: DC

## 2019-08-05 NOTE — Telephone Encounter (Signed)
[facility] faxed a refill request for Alendronate.  Message sent to PCP as last Rx was given by Dr Selena Batten.

## 2019-08-05 NOTE — Telephone Encounter (Signed)
Medication refilled

## 2019-10-22 ENCOUNTER — Telehealth: Payer: Self-pay | Admitting: Family Medicine

## 2019-10-22 NOTE — Telephone Encounter (Signed)
Pt said her brother died from getting his 2nd Covid vaccination and she is wondering if she should get her booster vaccine? She did say her brother had heart problems but she is just concerned since they are blood it will happen to her.  Pt would also like the name of an ear doctor that Dr. Hassan Rowan recommends. She said she is having trouble hearing.

## 2019-10-23 NOTE — Telephone Encounter (Signed)
If hearing issue is new; worthwhile to come here first and let us check ears. May be wax issue? I haven't seen her in over a year.   I like Dr. Suszanne Conners if she needs ENT; but may be that she needs audiology; so visit is probably helpful for sorting out best place to send her.   Generally speaking, the risks of not being vaccinated are much higher than side effects from vaccine. If she know more details about what happened with brother I might be able to address concerns better. I am happy that she got her original vaccine, so even if she didn't get this booster, she should still have some immune response. Generally people are doing very well with the boosters, and if she tolerated the first round ok; I would not expect her to have difficulty with another, but happy to give more feedback if she knows more about his case.

## 2019-10-23 NOTE — Telephone Encounter (Signed)
Spoke with the pt and informed her of the message below.  Appt scheduled for 10/4 with Dr Caryl Never as PCP does not have any openings.

## 2019-10-26 ENCOUNTER — Encounter: Payer: Self-pay | Admitting: Family Medicine

## 2019-10-26 ENCOUNTER — Other Ambulatory Visit: Payer: Self-pay

## 2019-10-26 ENCOUNTER — Ambulatory Visit (INDEPENDENT_AMBULATORY_CARE_PROVIDER_SITE_OTHER): Payer: Medicare Other | Admitting: Family Medicine

## 2019-10-26 VITALS — BP 130/80 | HR 75 | Temp 98.5°F | Ht 61.75 in | Wt 106.5 lb

## 2019-10-26 DIAGNOSIS — H9193 Unspecified hearing loss, bilateral: Secondary | ICD-10-CM | POA: Diagnosis not present

## 2019-10-26 DIAGNOSIS — I499 Cardiac arrhythmia, unspecified: Secondary | ICD-10-CM

## 2019-10-26 DIAGNOSIS — Z23 Encounter for immunization: Secondary | ICD-10-CM

## 2019-10-26 NOTE — Patient Instructions (Signed)
There is no evidence for cerumen/wax impaction.  Recommend follow-up with audiologist for hearing loss issues  Your EKG today did not show any significant arrhythmia.  However, when I was listening to your heart it seemed very irregular.  You may be going in and out of rhythm such as atrial fibrillation  Recommend set up follow-up with primary to discuss whether to evaluate further-example cardiac event monitor.  Follow-up immediately for any significant dizziness, increased shortness of breath, or other concerns

## 2019-10-26 NOTE — Progress Notes (Signed)
Established Patient Office Visit  Subjective:  Patient ID: [name], female    DOB: 12/04/1928  Age: 86 y.o. MRN: [id number]  CC:  Chief Complaint  Patient presents with  . Hearing Problem    Not able to hear well    HPI [name] presents for concern for bilateral hearing loss.  She states this has been going on for at least 2 years.  She apparently has seen audiologist previously and has hearing aids but she states they are of no help.  She does not wear these consistently.  She states that she was hoping there will be a "wax issue" today.  She denies any ear pain.  No vertigo.  In listening to her heart, she has irregular rhythm and apparently had this in the past.  In looking back over discharge summary from January 2020 there was question of A. fib during hospitalization then.  It was felt that her irregular rhythm then was sinus rhythm with very short runs of atrial tachycardia.  Patient was asymptomatic.  Echocardiogram then showed normal ejection fraction with grade 1 diastolic heart failure and mildly dilated left atrium.  There had been some discussion of anticoagulation but patient decided against at that time. Denies any recent chest pains, dyspnea, or dizziness.  Patient is very hard of hearing which makes communication difficult.  Past Medical History:  Diagnosis Date  . Alcohol abuse   . Emphysema of lung (HCC) 01/26/2009   pt. unsure of this reports as of 07/31/2012  . Fracture of fifth metatarsal bone of right foot 08/29/2012  . Gout 08/11/2012  . Hyperlipemia 03/27/2007  . Hypertension   . Osteoporosis 07/12/2006  . Pernicious anemia 10/25/2006  . Polymyalgia rheumatica (HCC) 03/27/2007   sees Dr. Kellie Simmering in rheumatology  . Vitamin D deficiency 07/12/2006    Past Surgical History:  Procedure Laterality Date  . APPENDECTOMY  07/12/2006  . LUMBAR LAMINECTOMY/DECOMPRESSION MICRODISCECTOMY Right 02/13/2017   Procedure: Microdiscectomy - right - Lumbar  three-Lumbar four;  Surgeon: Tia Alert, MD;  Location: [facility] OR;  Service: Neurosurgery;  Laterality: Right;  . PARTIAL HIP ARTHROPLASTY     left side- 2x's ( 2nd surgery to correct 1st replacement), R side -   . TOOTH EXTRACTION N/A 08/04/2012   Procedure: EXTRACTIONS 17, 20;  Surgeon: Georgia Lopes, DDS;  Location: [facility] OR;  Service: Oral Surgery;  Laterality: N/A;    Family History  Problem Relation Age of Onset  . Mental illness Mother        alzheimer dementia  . Ulcers Father   . Heart disease Father   . Cancer Father        lung, smoker    Social History   Socioeconomic History  . Marital status: Married    Spouse name: Not on file  . Number of children: 2  . Years of education: Not on file  . Highest education level: Not on file  Occupational History  . Not on file  Tobacco Use  . Smoking status: Former Smoker    Packs/day: 2.00    Years: 58.00    Pack years: 116.00    Start date: 01/23/1948    Quit date: 07/12/2006    Years since quitting: 13.2  . Smokeless tobacco: Never Used  Vaping Use  . Vaping Use: Never used  Substance and Sexual Activity  . Alcohol use: No    Alcohol/week: 10.0 standard drinks    Types: 10 Shots of liquor per  week    Comment: quit completely - 05-14-10  . Drug use: No  . Sexual activity: Never  Other Topics Concern  . Not on file  Social History Narrative   Work or School: none      Home Situation: lives with husband - drives, husband takes care of finances, dresses herself, takes care of housework and cooking      Spiritual Beliefs: [employer]      Lifestyle: uses cane when goes out, exercises sometimes - diet healthy      03/20/2018: Lives alone in one-level townhouse with dog. Husband passed away in fall of 05-13-2017. Coping well overall.   Has two daughters, both of whom live close by and are supportive.   Active in church   Enjoys gardening         Social Determinants of Health   Financial Resource Strain:   . Difficulty  of Paying Living Expenses: Not on file  Food Insecurity:   . Worried About Programme researcher, broadcasting/film/video in the Last Year: Not on file  . Ran Out of Food in the Last Year: Not on file  Transportation Needs:   . Lack of Transportation (Medical): Not on file  . Lack of Transportation (Non-Medical): Not on file  Physical Activity:   . Days of Exercise per Week: Not on file  . Minutes of Exercise per Session: Not on file  Stress:   . Feeling of Stress : Not on file  Social Connections:   . Frequency of Communication with Friends and Family: Not on file  . Frequency of Social Gatherings with Friends and Family: Not on file  . Attends Religious Services: Not on file  . Active Member of Clubs or Organizations: Not on file  . Attends Banker Meetings: Not on file  . Marital Status: Not on file  Intimate Partner Violence:   . Fear of Current or Ex-Partner: Not on file  . Emotionally Abused: Not on file  . Physically Abused: Not on file  . Sexually Abused: Not on file    Outpatient Medications Prior to Visit  Medication Sig Dispense Refill  . alendronate (FOSAMAX) 70 MG tablet Take with a full glass of water on an empty stomach. 12 tablet 3  . Cyanocobalamin (VITAMIN B-12) 1000 MCG SUBL Place 1,000 mcg under the tongue daily.     . diphenhydrAMINE (BENADRYL) 25 MG tablet Take 25 mg by mouth at bedtime as needed for sleep.    . furosemide (LASIX) 20 MG tablet Take 1 tablet (20 mg total) by mouth daily as needed. 30 tablet 0  . Oyster Shell (OYSTER CALCIUM) 500 MG TABS tablet Take 1,000 mg of elemental calcium by mouth daily.    . predniSONE (DELTASONE) 2.5 MG tablet TAKE 1 TABLET BY MOUTH  DAILY WITH BREAKFAST 90 tablet 3  . vitamin C (ASCORBIC ACID) 250 MG tablet Take 500 mg by mouth daily.     No facility-administered medications prior to visit.    Allergies  Allergen Reactions  . Aspirin Other (See Comments)    "threw up blood"  . Codeine Other (See Comments)    "threw up  blood"  . Naproxen Other (See Comments)    Severe pain in joints and hands  . Oxycodone-Acetaminophen     REACTION: gastric reaction    ROS Review of Systems  Constitutional: Negative for chills and fever.  HENT: Positive for hearing loss. Negative for ear pain.   Respiratory: Negative for cough and  shortness of breath.   Cardiovascular: Negative for chest pain.  Neurological: Negative for dizziness and syncope.  Psychiatric/Behavioral: Negative for confusion.      Objective:    Physical Exam Vitals reviewed.  Constitutional:      Appearance: Normal appearance.  Cardiovascular:     Comments: Regular rhythm with a rate around 75 Pulmonary:     Effort: Pulmonary effort is normal.     Breath sounds: Normal breath sounds.  Musculoskeletal:     Right lower leg: No edema.     Left lower leg: No edema.  Neurological:     Mental Status: She is alert.     BP 130/80   Pulse 75   Temp 98.5 F (36.9 C) (Oral)   Ht 5' 1.75" (1.568 m)   Wt 106 lb 8 oz (48.3 kg)   SpO2 98%   BMI 19.64 kg/m  Wt Readings from Last 3 Encounters:  10/26/19 106 lb 8 oz (48.3 kg)  09/26/18 109 lb 8 oz (49.7 kg)  03/20/18 107 lb 12.8 oz (48.9 kg)     Health Maintenance Due  Topic Date Due  . TETANUS/TDAP  10/14/2017  . INFLUENZA VACCINE  08/23/2019    There are no preventive care reminders to display for this patient.  Lab Results  Component Value Date   TSH 0.666 01/28/2018   Lab Results  Component Value Date   WBC 8.9 09/26/2018   HGB 13.8 09/26/2018   HCT 41.3 09/26/2018   MCV 99.2 09/26/2018   PLT 207.0 09/26/2018   Lab Results  Component Value Date   NA 142 09/26/2018   K 4.7 09/26/2018   CO2 31 09/26/2018   GLUCOSE 96 09/26/2018   BUN 11 09/26/2018   CREATININE 0.90 09/26/2018   BILITOT 0.5 09/26/2018   ALKPHOS 58 09/26/2018   AST 18 09/26/2018   ALT 9 09/26/2018   PROT 6.5 09/26/2018   ALBUMIN 3.8 09/26/2018   CALCIUM 9.3 09/26/2018   ANIONGAP 14 01/28/2018    GFR 59.03 (L) 09/26/2018   Lab Results  Component Value Date   CHOL 187 01/20/2016   Lab Results  Component Value Date   HDL 92.20 01/20/2016   Lab Results  Component Value Date   LDLCALC 89 07/21/2015   Lab Results  Component Value Date   TRIG 168.0 (H) 07/21/2015   Lab Results  Component Value Date   CHOLHDL 3 07/21/2015   Lab Results  Component Value Date   HGBA1C 5.8 07/21/2015      Assessment & Plan:   #1 Bilateral hearing loss.  This appears to be chronic.  She has seen audiologist previously and has hearing aids but states they have been of no help.  She does not have any evidence for correctable cause such as cerumen  -We have encouraged close follow-up with cardiology  #2 irregular heart rhythm.  Rule out atrial fibrillation EKG surprisingly shows sinus rhythm.  She had much more irregular rhythm at the time of examination and may be going in and out of rhythm such as atrial fibrillation. -Observe for now.  We have suggested follow-up with primary to reassess soon and immediately for any dizziness, shortness of breath, or other concerns    No orders of the defined types were placed in this encounter.   Follow-up: No follow-ups on file.    Evelena Peat, MD

## 2019-10-26 NOTE — Addendum Note (Signed)
Addended by: Ronita Hipps on: 10/26/2019 02:56 PM   Modules accepted: Orders

## 2019-11-11 ENCOUNTER — Telehealth: Payer: Self-pay | Admitting: Family Medicine

## 2019-11-11 ENCOUNTER — Other Ambulatory Visit: Payer: Self-pay | Admitting: Family Medicine

## 2019-11-11 DIAGNOSIS — I499 Cardiac arrhythmia, unspecified: Secondary | ICD-10-CM

## 2019-11-11 NOTE — Telephone Encounter (Signed)
At her last visit with Dr. Caryl Never, Pt said that he suggested that she see a cardiologist. she needs a referral  Dr Hassan Rowan is pt MD

## 2019-11-11 NOTE — Telephone Encounter (Signed)
Referral placed for cardiology

## 2019-11-12 NOTE — Telephone Encounter (Signed)
Spoke with the pt and informed her of the message below.  Patient stated she has an appt on 11/15.

## 2019-12-07 ENCOUNTER — Ambulatory Visit (INDEPENDENT_AMBULATORY_CARE_PROVIDER_SITE_OTHER): Payer: Medicare Other | Admitting: Family Medicine

## 2019-12-07 ENCOUNTER — Encounter: Payer: Self-pay | Admitting: Family Medicine

## 2019-12-07 ENCOUNTER — Other Ambulatory Visit: Payer: Self-pay

## 2019-12-07 VITALS — BP 170/80 | HR 62 | Temp 97.9°F | Ht 61.75 in | Wt 105.9 lb

## 2019-12-07 DIAGNOSIS — I10 Essential (primary) hypertension: Secondary | ICD-10-CM | POA: Diagnosis not present

## 2019-12-07 DIAGNOSIS — M353 Polymyalgia rheumatica: Secondary | ICD-10-CM

## 2019-12-07 DIAGNOSIS — D510 Vitamin B12 deficiency anemia due to intrinsic factor deficiency: Secondary | ICD-10-CM

## 2019-12-07 DIAGNOSIS — H9193 Unspecified hearing loss, bilateral: Secondary | ICD-10-CM

## 2019-12-07 DIAGNOSIS — R350 Frequency of micturition: Secondary | ICD-10-CM | POA: Diagnosis not present

## 2019-12-07 DIAGNOSIS — E559 Vitamin D deficiency, unspecified: Secondary | ICD-10-CM | POA: Diagnosis not present

## 2019-12-07 DIAGNOSIS — M810 Age-related osteoporosis without current pathological fracture: Secondary | ICD-10-CM | POA: Diagnosis not present

## 2019-12-07 DIAGNOSIS — Z13220 Encounter for screening for lipoid disorders: Secondary | ICD-10-CM

## 2019-12-07 DIAGNOSIS — I4891 Unspecified atrial fibrillation: Secondary | ICD-10-CM

## 2019-12-07 NOTE — Progress Notes (Signed)
[name] DOB: 08-28-1928 Encounter date: 12/07/2019  This is a 86 y.o. female who presents with Chief Complaint  Patient presents with  . Follow-up    History of present illness:  Patient was seen by Dr. Caryl Never on 10/4 and noted to have irregular heart rhythm.  EKG showed sinus rhythm when completed.  Suggested close follow-up and monitoring for any symptoms of atrial fibrillation.  She states that she was sick a year ago and taken to ER at [facility]. Had RSV infection. Given breathing tx and nurse that night told her she had irregular heart beat. She thinks    Last visit with me was 09/2018.  At that time she had lower extremity wounds and foot pain.  Hypertension: Lasix 20 mg daily Osteoporosis: Fosamax 70 mg weekly, vitamin D normal when checked last year. Pernicious anemia: Vitamin B-12 levels were sufficient when checked last year.  She does supplement with over-the-counter B12. Polymyalgia rheumatica: She continues with prednisone 2.5 mg daily.   Allergies  Allergen Reactions  . Aspirin Other (See Comments)    "threw up blood"  . Codeine Other (See Comments)    "threw up blood"  . Naproxen Other (See Comments)    Severe pain in joints and hands  . Oxycodone-Acetaminophen     REACTION: gastric reaction   Current Meds  Medication Sig  . alendronate (FOSAMAX) 70 MG tablet Take with a full glass of water on an empty stomach.  . Cyanocobalamin (VITAMIN B-12) 1000 MCG SUBL Place 1,000 mcg under the tongue daily.   . diphenhydrAMINE (BENADRYL) 25 MG tablet Take 25 mg by mouth at bedtime as needed for sleep.  . furosemide (LASIX) 20 MG tablet Take 1 tablet (20 mg total) by mouth daily as needed.  Ethelda Chick (OYSTER CALCIUM) 500 MG TABS tablet Take 1,000 mg of elemental calcium by mouth daily.  . predniSONE (DELTASONE) 2.5 MG tablet TAKE 1 TABLET BY MOUTH  DAILY WITH BREAKFAST  . vitamin C (ASCORBIC ACID) 250 MG tablet Take 500 mg by mouth daily.    Review of  Systems  Constitutional: Negative for chills, fatigue and fever.  Respiratory: Negative for cough, chest tightness, shortness of breath and wheezing.   Cardiovascular: Negative for chest pain, palpitations and leg swelling.  Genitourinary: Positive for frequency. Negative for difficulty urinating and dyspareunia.  Musculoskeletal: Positive for gait problem (hips bother her sometimes; make walking difficult).    Objective:  BP (!) 170/80 (BP Location: Left Arm, Patient Position: Sitting, Cuff Size: Normal)   Pulse 62   Temp 97.9 F (36.6 C) (Oral)   Ht 5' 1.75" (1.568 m)   Wt 105 lb 14.4 oz (48 kg)   BMI 19.53 kg/m   Weight: 105 lb 14.4 oz (48 kg)   BP Readings from Last 3 Encounters:  12/07/19 (!) 170/80  10/26/19 130/80  09/26/18 (!) 150/70   Wt Readings from Last 3 Encounters:  12/07/19 105 lb 14.4 oz (48 kg)  10/26/19 106 lb 8 oz (48.3 kg)  09/26/18 109 lb 8 oz (49.7 kg)    Physical Exam Constitutional:      General: She is not in acute distress.    Appearance: She is well-developed.  Cardiovascular:     Rate and Rhythm: Normal rate and regular rhythm.     Heart sounds: Normal heart sounds. No murmur heard.  No friction rub.  Pulmonary:     Effort: Pulmonary effort is normal. No respiratory distress.     Breath sounds:  Normal breath sounds. No wheezing or rales.  Musculoskeletal:     Right lower leg: No edema.     Left lower leg: No edema.  Neurological:     Mental Status: She is alert and oriented to person, place, and time.     Gait: Gait abnormal (slow but steady).  Psychiatric:        Behavior: Behavior normal.     Assessment/Plan 1. Essential hypertension I have asked her to check blood pressures at home; this morning was very stressful for her. Her pet dog had to go to the vet, it is her anniversary (husband has passed), and she was confused about what visit was for (thought she was seeing cardiology).  - CBC with Differential/Platelet; Future -  Comprehensive metabolic panel; Future - Comprehensive metabolic panel - CBC with Differential/Platelet  2. Osteoporosis without current pathological fracture, unspecified osteoporosis type She is taking fosamax daily. She has been unable to completely wean off prednisone due to polymyalgia rheumatica.  3. Bilateral hearing loss, unspecified hearing loss type She had wanted to see ENT previously, but I am not sure it would be of significant benefit to her. Hearing loss is chronic and she is not wanting to invest in different hearing aids since there is no guarantee of improved hearing. We can keep this in mind for the future if she would like to see specialist.  4. POLYMYALGIA RHEUMATICA Continues to be well controlled on low dose prednisone (2.5mg  daily). She has tried to wean off multiple times with joint flares as a result. - Sedimentation rate; Future - Sedimentation rate  5. Urinary frequency Does feel like she has some mild increase over years; no discomfort with urination.  6. Atrial fibrillation, unspecified type (HCC) She is in sinus rhythm on exam today. I do suspect based on history and exam at previous visit that she does have paroxysmal atrial fib. We discussed this and risks including stroke today. We also discussed risks with anticoagulation, esp with her being on prednisone.  - TSH; Future - TSH  7. Lipid screening - Lipid panel; Future - Lipid panel  8. Pernicious anemia - Vitamin B12; Future - Vitamin B12  9. Vitamin D deficiency - VITAMIN D 25 Hydroxy (Vit-D Deficiency, Fractures); Future - VITAMIN D 25 Hydroxy (Vit-D Deficiency, Fractures)    CHA2DS2-Vasc score: 4; Hasbleed score 3    Theodis Shove, MD

## 2019-12-07 NOTE — Patient Instructions (Signed)
Please call this number to set up cardiology appointment.   [name], [place]  Address: [address], [place], [place] [address] Phone: [phone]

## 2019-12-08 ENCOUNTER — Ambulatory Visit: Payer: Medicare Other

## 2019-12-08 LAB — COMPREHENSIVE METABOLIC PANEL
AG Ratio: 1.5 (calc) (ref 1.0–2.5)
ALT: 10 U/L (ref 6–29)
AST: 21 U/L (ref 10–35)
Albumin: 4.1 g/dL (ref 3.6–5.1)
Alkaline phosphatase (APISO): 64 U/L (ref 37–153)
BUN/Creatinine Ratio: 14 (calc) (ref 6–22)
BUN: 13 mg/dL (ref 7–25)
CO2: 28 mmol/L (ref 20–32)
Calcium: 9.7 mg/dL (ref 8.6–10.4)
Chloride: 103 mmol/L (ref 98–110)
Creat: 0.95 mg/dL — ABNORMAL HIGH (ref 0.60–0.88)
Globulin: 2.8 g/dL (calc) (ref 1.9–3.7)
Glucose, Bld: 100 mg/dL — ABNORMAL HIGH (ref 65–99)
Potassium: 4.8 mmol/L (ref 3.5–5.3)
Sodium: 142 mmol/L (ref 135–146)
Total Bilirubin: 0.5 mg/dL (ref 0.2–1.2)
Total Protein: 6.9 g/dL (ref 6.1–8.1)

## 2019-12-08 LAB — SEDIMENTATION RATE: Sed Rate: 6 mm/h (ref 0–30)

## 2019-12-08 LAB — CBC WITH DIFFERENTIAL/PLATELET
Absolute Monocytes: 418 cells/uL (ref 200–950)
Basophils Absolute: 23 cells/uL (ref 0–200)
Basophils Relative: 0.3 %
Eosinophils Absolute: 23 cells/uL (ref 15–500)
Eosinophils Relative: 0.3 %
HCT: 42.9 % (ref 35.0–45.0)
Hemoglobin: 14.5 g/dL (ref 11.7–15.5)
Lymphs Abs: 988 cells/uL (ref 850–3900)
MCH: 32.7 pg (ref 27.0–33.0)
MCHC: 33.8 g/dL (ref 32.0–36.0)
MCV: 96.8 fL (ref 80.0–100.0)
MPV: 10.8 fL (ref 7.5–12.5)
Monocytes Relative: 5.5 %
Neutro Abs: 6148 cells/uL (ref 1500–7800)
Neutrophils Relative %: 80.9 %
Platelets: 204 10*3/uL (ref 140–400)
RBC: 4.43 10*6/uL (ref 3.80–5.10)
RDW: 12.4 % (ref 11.0–15.0)
Total Lymphocyte: 13 %
WBC: 7.6 10*3/uL (ref 3.8–10.8)

## 2019-12-08 LAB — LIPID PANEL
Cholesterol: 188 mg/dL (ref <200)
HDL: 88 mg/dL (ref >=50)
LDL Cholesterol (Calc): 80 mg/dL (calc)
Non-HDL Cholesterol (Calc): 100 mg/dL (calc) (ref <130)
Total CHOL/HDL Ratio: 2.1 (calc) (ref <5.0)
Triglycerides: 107 mg/dL (ref <150)

## 2019-12-08 LAB — VITAMIN B12: Vitamin B-12: 935 pg/mL (ref 200–1100)

## 2019-12-08 LAB — VITAMIN D 25 HYDROXY (VIT D DEFICIENCY, FRACTURES): Vit D, 25-Hydroxy: 32 ng/mL (ref 30–100)

## 2019-12-08 LAB — TSH: TSH: 1.5 mIU/L (ref 0.40–4.50)

## 2020-01-08 ENCOUNTER — Other Ambulatory Visit: Payer: Self-pay | Admitting: Family Medicine

## 2020-02-09 ENCOUNTER — Telehealth: Payer: Self-pay | Admitting: Family Medicine

## 2020-02-09 NOTE — Telephone Encounter (Signed)
Spoke to [employer] (daughter) she will talk to pt and call back to  schedule Medicare Annual Wellness Visit (AWV) either virtually or in office.   Last AWV 03/20/2018  please schedule at anytime with [facility] Nurse Health Advisor 1 or 2   This should be a 45 minute visit.

## 2020-02-17 DIAGNOSIS — H353121 Nonexudative age-related macular degeneration, left eye, early dry stage: Secondary | ICD-10-CM | POA: Diagnosis not present

## 2020-02-17 DIAGNOSIS — H353113 Nonexudative age-related macular degeneration, right eye, advanced atrophic without subfoveal involvement: Secondary | ICD-10-CM | POA: Diagnosis not present

## 2020-02-17 DIAGNOSIS — H35373 Puckering of macula, bilateral: Secondary | ICD-10-CM | POA: Diagnosis not present

## 2020-02-17 DIAGNOSIS — H3581 Retinal edema: Secondary | ICD-10-CM | POA: Diagnosis not present

## 2020-03-07 DIAGNOSIS — H35371 Puckering of macula, right eye: Secondary | ICD-10-CM | POA: Diagnosis not present

## 2020-03-07 DIAGNOSIS — H353113 Nonexudative age-related macular degeneration, right eye, advanced atrophic without subfoveal involvement: Secondary | ICD-10-CM | POA: Diagnosis not present

## 2020-03-07 DIAGNOSIS — H353112 Nonexudative age-related macular degeneration, right eye, intermediate dry stage: Secondary | ICD-10-CM | POA: Diagnosis not present

## 2020-03-15 DIAGNOSIS — H35373 Puckering of macula, bilateral: Secondary | ICD-10-CM | POA: Diagnosis not present

## 2020-03-15 DIAGNOSIS — H3581 Retinal edema: Secondary | ICD-10-CM | POA: Diagnosis not present

## 2020-04-05 DIAGNOSIS — H35372 Puckering of macula, left eye: Secondary | ICD-10-CM | POA: Diagnosis not present

## 2020-04-05 DIAGNOSIS — H3581 Retinal edema: Secondary | ICD-10-CM | POA: Diagnosis not present

## 2020-04-08 ENCOUNTER — Telehealth: Payer: Self-pay | Admitting: Family Medicine

## 2020-04-08 NOTE — Telephone Encounter (Signed)
Spoke with patient Daughter DeeDee to schedule Medicare Annual Wellness Visit (AWV) either virtually or in office.  She will get with her mom and call back to schedule   Last AWV 03/20/2018  please schedule at anytime with [facility] Nurse Health Advisor 1 or 2   This should be a 45 minute visit.

## 2020-04-15 DIAGNOSIS — H353112 Nonexudative age-related macular degeneration, right eye, intermediate dry stage: Secondary | ICD-10-CM | POA: Diagnosis not present

## 2020-04-18 ENCOUNTER — Other Ambulatory Visit: Payer: Self-pay | Admitting: Family Medicine

## 2020-06-21 ENCOUNTER — Telehealth: Payer: Self-pay | Admitting: Family Medicine

## 2020-06-21 NOTE — Telephone Encounter (Signed)
Left message for patient to call back and schedule Medicare Annual Wellness Visit (AWV) either virtually or in office.   Last AWV 03/20/2018  please schedule at anytime with [facility] Nurse Health Advisor 1 or 2   This should be a 45 minute visit.

## 2020-07-09 ENCOUNTER — Other Ambulatory Visit: Payer: Self-pay | Admitting: Family Medicine

## 2020-07-27 ENCOUNTER — Other Ambulatory Visit: Payer: Self-pay

## 2020-07-27 ENCOUNTER — Ambulatory Visit (INDEPENDENT_AMBULATORY_CARE_PROVIDER_SITE_OTHER): Payer: Medicare Other | Admitting: Family Medicine

## 2020-07-27 ENCOUNTER — Encounter: Payer: Self-pay | Admitting: Family Medicine

## 2020-07-27 ENCOUNTER — Ambulatory Visit (INDEPENDENT_AMBULATORY_CARE_PROVIDER_SITE_OTHER): Payer: Medicare Other

## 2020-07-27 VITALS — BP 138/68 | HR 70 | Temp 98.0°F | Ht 62.0 in | Wt 103.0 lb

## 2020-07-27 VITALS — BP 138/68 | HR 70 | Temp 98.0°F | Wt 103.0 lb

## 2020-07-27 DIAGNOSIS — S81811A Laceration without foreign body, right lower leg, initial encounter: Secondary | ICD-10-CM

## 2020-07-27 DIAGNOSIS — Z0000 Encounter for general adult medical examination without abnormal findings: Secondary | ICD-10-CM | POA: Diagnosis not present

## 2020-07-27 DIAGNOSIS — L03115 Cellulitis of right lower limb: Secondary | ICD-10-CM

## 2020-07-27 DIAGNOSIS — Z23 Encounter for immunization: Secondary | ICD-10-CM | POA: Diagnosis not present

## 2020-07-27 MED ORDER — AMOXICILLIN-POT CLAVULANATE 500-125 MG PO TABS: 1.0000 | ORAL_TABLET | Freq: Two times a day (BID) | ORAL | 0 refills | Status: AC

## 2020-07-27 NOTE — Progress Notes (Signed)
Subjective:   [name] is a 87 y.o. female who presents for Medicare Annual (Subsequent) preventive examination.  Review of Systems    N/a       Objective:    There were no vitals filed for this visit. There is no height or weight on file to calculate BMI.  Advanced Directives 03/21/2018 01/27/2018 02/14/2017 02/13/2017 02/11/2017 08/30/2016 08/04/2012  Does Patient Have a Medical Advance Directive? Yes Yes Yes Yes Yes Yes -  Type of Estate agent of [place];Living will Healthcare Power of [place];Living will Healthcare Power of [place];Living will Healthcare Power of [place];Living will Healthcare Power of [place];Living will - -  Does patient want to make changes to medical advance directive? No - Patient declined - No - Patient declined No - Patient declined No - Patient declined - -  Copy of Healthcare Power of Attorney in Chart? No - copy requested - No - copy requested No - copy requested No - copy requested - -  Pre-existing out of facility DNR order (yellow form or pink MOST form) - - - - - - No    Current Medications (verified) Outpatient Encounter Medications as of 07/27/2020  Medication Sig   alendronate (FOSAMAX) 70 MG tablet TAKE 1 TABLET BY MOUTH  WEEKLY WITH 8 OZ OF PLAIN  WATER 30 MINUTES BEFORE  FIRST FOOD, DRINK OR MEDS.  STAY UPRIGHT FOR 30 MINS   Cyanocobalamin (VITAMIN B-12) 1000 MCG SUBL Place 1,000 mcg under the tongue daily.    diphenhydrAMINE (BENADRYL) 25 MG tablet Take 25 mg by mouth at bedtime as needed for sleep.   furosemide (LASIX) 20 MG tablet Take 1 tablet (20 mg total) by mouth daily as needed.   Oyster Shell (OYSTER CALCIUM) 500 MG TABS tablet Take 1,000 mg of elemental calcium by mouth daily.   predniSONE (DELTASONE) 2.5 MG tablet TAKE 1 TABLET BY MOUTH  DAILY WITH BREAKFAST   vitamin C (ASCORBIC ACID) 250 MG tablet Take 500 mg by mouth daily.   No facility-administered encounter medications on file as of 07/27/2020.     Allergies (verified) Aspirin, Codeine, Naproxen, and Oxycodone-acetaminophen   History: Past Medical History:  Diagnosis Date   Alcohol abuse    Emphysema of lung (HCC) 01/26/2009   pt. unsure of this reports as of 07/31/2012   Fracture of fifth metatarsal bone of right foot 08/29/2012   Gout 08/11/2012   Hyperlipemia 03/27/2007   Hypertension    Osteoporosis 07/12/2006   Pernicious anemia 10/25/2006   Polymyalgia rheumatica (HCC) 03/27/2007   sees Dr. Kellie Simmering in rheumatology   Vitamin D deficiency 07/12/2006   Past Surgical History:  Procedure Laterality Date   APPENDECTOMY  07/12/2006   LUMBAR LAMINECTOMY/DECOMPRESSION MICRODISCECTOMY Right 02/13/2017   Procedure: Microdiscectomy - right - Lumbar three-Lumbar four;  Surgeon: Tia Alert, MD;  Location: [facility] OR;  Service: Neurosurgery;  Laterality: Right;   PARTIAL HIP ARTHROPLASTY     left side- 2x's ( 2nd surgery to correct 1st replacement), R side -    TOOTH EXTRACTION N/A 08/04/2012   Procedure: EXTRACTIONS 17, 20;  Surgeon: Georgia Lopes, DDS;  Location: [facility] OR;  Service: Oral Surgery;  Laterality: N/A;   Family History  Problem Relation Age of Onset   Mental illness Mother        alzheimer dementia   Ulcers Father    Heart disease Father    Cancer Father        lung, smoker   Social History  Socioeconomic History   Marital status: Married    Spouse name: Not on file   Number of children: 2   Years of education: Not on file   Highest education level: Not on file  Occupational History   Not on file  Tobacco Use   Smoking status: Former    Packs/day: 2.00    Years: 58.00    Pack years: 116.00    Types: Cigarettes    Start date: 01/23/1948    Quit date: 07/12/2006    Years since quitting: 14.0   Smokeless tobacco: Never  Vaping Use   Vaping Use: Never used  Substance and Sexual Activity   Alcohol use: No    Alcohol/week: 10.0 standard drinks    Types: 10 Shots of liquor per week    Comment: quit completely -  2012   Drug use: No   Sexual activity: Never  Other Topics Concern   Not on file  Social History Narrative   Work or School: none      Home Situation: lives with husband - drives, husband takes care of finances, dresses herself, takes care of housework and cooking      Spiritual Beliefs: [employer]      Lifestyle: uses cane when goes out, exercises sometimes - diet healthy      03/20/2018: Lives alone in one-level townhouse with dog. Husband passed away in fall of 2019. Coping well overall.   Has two daughters, both of whom live close by and are supportive.   Active in church   Enjoys gardening         Social Determinants of Health   Financial Resource Strain: Not on file  Food Insecurity: Not on file  Transportation Needs: Not on file  Physical Activity: Not on file  Stress: Not on file  Social Connections: Not on file    Tobacco Counseling Counseling given: Not Answered   Clinical Intake:                 Diabetic?no         Activities of Daily Living No flowsheet data found.  Patient Care Team: Wynn BankerKoberlein, Junell C, MD as PCP - General (Family Medicine) Antony ContrasLyles, Graham, MD as Consulting Physician (Ophthalmology)  Indicate any recent Medical Services you may have received from other than [facility] providers in the past year (date may be approximate).     Assessment:   This is a routine wellness examination for [name].  Hearing/Vision screen No results found.  Dietary issues and exercise activities discussed:     Goals Addressed   None    Depression Screen PHQ 2/9 Scores 03/20/2018 08/30/2016 03/19/2014 09/08/2012  PHQ - 2 Score 1 0 0 0  PHQ- 9 Score 1 - - -    Fall Risk Fall Risk  03/20/2018 08/30/2016 08/13/2016 03/19/2014 09/08/2012  Falls in the past year? 0 No No No No  Comment - - Emmi Telephone Survey: data to providers prior to load - -  Risk for fall due to : History of fall(s);Impaired vision;Impaired mobility - - - -  Follow up  Falls prevention discussed;Education provided - - - -    FALL RISK PREVENTION PERTAINING TO THE HOME:  Any stairs in or around the home? No  If so, are there any without handrails? No  Home free of loose throw rugs in walkways, pet beds, electrical cords, etc? Yes  Adequate lighting in your home to reduce risk of falls? Yes   ASSISTIVE DEVICES UTILIZED  TO PREVENT FALLS:  Life alert? No  Use of a cane, walker or w/c? Yes  Grab bars in the bathroom? No  Shower chair or bench in shower? No  Elevated toilet seat or a handicapped toilet? Yes   TIMED UP AND GO:  Was the test performed? Yes .  Length of time to ambulate 10 feet: 20 sec.   Gait slow and steady with assistive device  Cognitive Function: MMSE - Mini Mental State Exam 08/30/2016  Orientation to time 5  Orientation to Place 5  Registration 3  Attention/ Calculation 5  Recall 2  Language- name 2 objects 2  Language- repeat 1  Language- follow 3 step command 3  Language- read & follow direction 1  Write a sentence 1  Copy design 0  Total score 28        Immunizations Immunization History  Administered Date(s) Administered   Fluad Quad(high Dose 65+) 09/26/2018, 10/26/2019   Influenza Split 10/24/2010, 10/04/2011   Influenza Whole 12/18/2006, 11/12/2007, 10/19/2008, 10/25/2009   Influenza, High Dose Seasonal PF 10/12/2014, 10/10/2015, 10/25/2016, 11/25/2017   Influenza,inj,Quad PF,6+ Mos 10/02/2012, 10/21/2013   PFIZER(Purple Top)SARS-COV-2 Vaccination 02/08/2019, 03/02/2019   Pneumococcal Conjugate-13 07/20/2014   Pneumococcal Polysaccharide-23 11/23/1994, 09/08/2012   Td 06/22/1997, 10/15/2007    TDAP status: Due, Education has been provided regarding the importance of this vaccine. Advised may receive this vaccine at local pharmacy or Health Dept. Aware to provide a copy of the vaccination record if obtained from local pharmacy or Health Dept. Verbalized acceptance and understanding.  Flu Vaccine status:  Up to date  Pneumococcal vaccine status: Up to date  Covid-19 vaccine status: Completed vaccines  Qualifies for Shingles Vaccine? Yes   Zostavax completed No   Shingrix Completed?: Yes  Screening Tests Health Maintenance  Topic Date Due   Zoster Vaccines- Shingrix (1 of 2) Never done   TETANUS/TDAP  10/14/2017   COVID-19 Vaccine (3 - Booster for Pfizer series) 07/30/2019   INFLUENZA VACCINE  08/22/2020   DEXA SCAN  Completed   PNA vac Low Risk Adult  Completed   HPV VACCINES  Aged Out    Health Maintenance  Health Maintenance Due  Topic Date Due   Zoster Vaccines- Shingrix (1 of 2) Never done   TETANUS/TDAP  10/14/2017   COVID-19 Vaccine (3 - Booster for Pfizer series) 07/30/2019    Colorectal cancer screening: No longer required.   Mammogram status: No longer required due to age.  Bone Density status: Completed 10/15/2007. Results reflect: Bone density results: OSTEOPOROSIS. Repeat every 2 years.  Lung Cancer Screening: (Low Dose CT Chest recommended if Age 81-80 years, 30 pack-year currently smoking OR have quit w/in 15years.) does not qualify.   Lung Cancer Screening Referral: n/a  Additional Screening: Hepatitis C Screening: does not qualify  Vision Screening: Recommended annual ophthalmology exams for early detection of glaucoma and other disorders of the eye. Is the patient up to date with their annual eye exam?  Yes  Who is the provider or what is the name of the office in which the patient attends annual eye exams? Dr.Lyles If pt is not established with a provider, would they like to be referred to a provider to establish care? No .   Dental Screening: Recommended annual dental exams for proper oral hygiene  Community Resource Referral / Chronic Care Management: CRR required this visit?  No   CCM required this visit?  No      Plan:     I have  personally reviewed and noted the following in the patient's chart:   Medical and social history Use of  alcohol, tobacco or illicit drugs  Current medications and supplements including opioid prescriptions.  Functional ability and status Nutritional status Physical activity Advanced directives List of other physicians Hospitalizations, surgeries, and ER visits in previous 12 months Vitals Screenings to include cognitive, depression, and falls Referrals and appointments  In addition, I have reviewed and discussed with patient certain preventive protocols, quality metrics, and best practice recommendations. A written personalized care plan for preventive services as well as general preventive health recommendations were provided to patient.     March Rummage, LPN   03/02/9369   Nurse Notes: none

## 2020-07-27 NOTE — Patient Instructions (Addendum)
[name] , Thank you for taking time to come for your Medicare Wellness Visit. I appreciate your ongoing commitment to your health goals. Please review the following plan we discussed and let me know if I can assist you in the future.   Screening recommendations/referrals: Colonoscopy: No longer required  Mammogram: no longer required  Bone Density: 10/15/2007 Recommended yearly ophthalmology/optometry visit for glaucoma screening and checkup Recommended yearly dental visit for hygiene and checkup  Vaccinations: Influenza vaccine: due in fall 2022 Pneumococcal vaccine: completed series  Tdap vaccine: currently due will injury  Shingles vaccine: declined    Advanced directives: will provide copies   Conditions/risks identified: injury to right leg will se Dr.Banks at 200 pn 07/27/2020 to be evaluated   Next appointment: 07/27/2020  @200  pm Dr.Banks     08/26/2020 @400  pm with Dr.Koberlein CPE   Preventive Care 65 Years and Older, Female Preventive care refers to lifestyle choices and visits with your health care provider that can promote health and wellness. What does preventive care include? A yearly physical exam. This is also called an annual well check. Dental exams once or twice a year. Routine eye exams. Ask your health care provider how often you should have your eyes checked. Personal lifestyle choices, including: Daily care of your teeth and gums. Regular physical activity. Eating a healthy diet. Avoiding tobacco and drug use. Limiting alcohol use. Practicing safe sex. Taking low-dose aspirin every day. Taking vitamin and mineral supplements as recommended by your health care provider. What happens during an annual well check? The services and screenings done by your health care provider during your annual well check will depend on your age, overall health, lifestyle risk factors, and family history of disease. Counseling  Your health care provider may ask you  questions about your: Alcohol use. Tobacco use. Drug use. Emotional well-being. Home and relationship well-being. Sexual activity. Eating habits. History of falls. Memory and ability to understand (cognition). Work and work 10/26/2020. Reproductive health. Screening  You may have the following tests or measurements: Height, weight, and BMI. Blood pressure. Lipid and cholesterol levels. These may be checked every 5 years, or more frequently if you are over 29 years old. Skin check. Lung cancer screening. You may have this screening every year starting at age 54 if you have a 30-pack-year history of smoking and currently smoke or have quit within the past 15 years. Fecal occult blood test (FOBT) of the stool. You may have this test every year starting at age 30. Flexible sigmoidoscopy or colonoscopy. You may have a sigmoidoscopy every 5 years or a colonoscopy every 10 years starting at age 66. Hepatitis C blood test. Hepatitis B blood test. Sexually transmitted disease (STD) testing. Diabetes screening. This is done by checking your blood sugar (glucose) after you have not eaten for a while (fasting). You may have this done every 1-3 years. Bone density scan. This is done to screen for osteoporosis. You may have this done starting at age 50. Mammogram. This may be done every 1-2 years. Talk to your health care provider about how often you should have regular mammograms. Talk with your health care provider about your test results, treatment options, and if necessary, the need for more tests. Vaccines  Your health care provider may recommend certain vaccines, such as: Influenza vaccine. This is recommended every year. Tetanus, diphtheria, and acellular pertussis (Tdap, Td) vaccine. You may need a Td booster every 10 years. Zoster vaccine. You may need this after age 68.  Pneumococcal 13-valent conjugate (PCV13) vaccine. One dose is recommended after age 91. Pneumococcal polysaccharide  (PPSV23) vaccine. One dose is recommended after age 68. Talk to your health care provider about which screenings and vaccines you need and how often you need them. This information is not intended to replace advice given to you by your health care provider. Make sure you discuss any questions you have with your health care provider. Document Released: 02/04/2015 Document Revised: 09/28/2015 Document Reviewed: 11/09/2014 Elsevier Interactive Patient Education  2017 [employer] Prevention in the Home Falls can cause injuries. They can happen to people of all ages. There are many things you can do to make your home safe and to help prevent falls. What can I do on the outside of my home? Regularly fix the edges of walkways and driveways and fix any cracks. Remove anything that might make you trip as you walk through a door, such as a raised step or threshold. Trim any bushes or trees on the path to your home. Use bright outdoor lighting. Clear any walking paths of anything that might make someone trip, such as rocks or tools. Regularly check to see if handrails are loose or broken. Make sure that both sides of any steps have handrails. Any raised decks and porches should have guardrails on the edges. Have any leaves, snow, or ice cleared regularly. Use sand or salt on walking paths during winter. Clean up any spills in your garage right away. This includes oil or grease spills. What can I do in the bathroom? Use night lights. Install grab bars by the toilet and in the tub and shower. Do not use towel bars as grab bars. Use non-skid mats or decals in the tub or shower. If you need to sit down in the shower, use a plastic, non-slip stool. Keep the floor dry. Clean up any water that spills on the floor as soon as it happens. Remove soap buildup in the tub or shower regularly. Attach bath mats securely with double-sided non-slip rug tape. Do not have throw rugs and other things on the  floor that can make you trip. What can I do in the bedroom? Use night lights. Make sure that you have a light by your bed that is easy to reach. Do not use any sheets or blankets that are too big for your bed. They should not hang down onto the floor. Have a firm chair that has side arms. You can use this for support while you get dressed. Do not have throw rugs and other things on the floor that can make you trip. What can I do in the kitchen? Clean up any spills right away. Avoid walking on wet floors. Keep items that you use a lot in easy-to-reach places. If you need to reach something above you, use a strong step stool that has a grab bar. Keep electrical cords out of the way. Do not use floor polish or wax that makes floors slippery. If you must use wax, use non-skid floor wax. Do not have throw rugs and other things on the floor that can make you trip. What can I do with my stairs? Do not leave any items on the stairs. Make sure that there are handrails on both sides of the stairs and use them. Fix handrails that are broken or loose. Make sure that handrails are as long as the stairways. Check any carpeting to make sure that it is firmly attached to the stairs. Fix any  carpet that is loose or worn. Avoid having throw rugs at the top or bottom of the stairs. If you do have throw rugs, attach them to the floor with carpet tape. Make sure that you have a light switch at the top of the stairs and the bottom of the stairs. If you do not have them, ask someone to add them for you. What else can I do to help prevent falls? Wear shoes that: Do not have high heels. Have rubber bottoms. Are comfortable and fit you well. Are closed at the toe. Do not wear sandals. If you use a stepladder: Make sure that it is fully opened. Do not climb a closed stepladder. Make sure that both sides of the stepladder are locked into place. Ask someone to hold it for you, if possible. Clearly mark and make  sure that you can see: Any grab bars or handrails. First and last steps. Where the edge of each step is. Use tools that help you move around (mobility aids) if they are needed. These include: Canes. Walkers. Scooters. Crutches. Turn on the lights when you go into a dark area. Replace any light bulbs as soon as they burn out. Set up your furniture so you have a clear path. Avoid moving your furniture around. If any of your floors are uneven, fix them. If there are any pets around you, be aware of where they are. Review your medicines with your doctor. Some medicines can make you feel dizzy. This can increase your chance of falling. Ask your doctor what other things that you can do to help prevent falls. This information is not intended to replace advice given to you by your health care provider. Make sure you discuss any questions you have with your health care provider. Document Released: 11/04/2008 Document Revised: 06/16/2015 Document Reviewed: 02/12/2014 Elsevier Interactive Patient Education  2017 [employer].

## 2020-07-27 NOTE — Progress Notes (Signed)
Subjective:    Patient ID: [name], female    DOB: Feb 22, 1928, 87 y.o.   MRN: [id number]  Chief Complaint  Patient presents with   Leg Injury    Rt lower leg,     HPI Patient was seen today for acute concern.  Pt seen by nurse for AWV who made appointment for pt after noticing a possibly infected wound on her R lower leg.  Pt notes a glass bowel slipped out of her hand while washing it 2 wks ago, cutting her R ankle.  Pt used triple abx ointment on the area.  Pt denies fever, chills, nausea, vomiting.  Patient endorses pain, increased warmth, and bloody drainage from the area.  Last Tdap 2007.  Past Medical History:  Diagnosis Date   Alcohol abuse    Emphysema of lung (HCC) 01/26/2009   pt. unsure of this reports as of 07/31/2012   Fracture of fifth metatarsal bone of right foot 08/29/2012   Gout 08/11/2012   Hyperlipemia 03/27/2007   Hypertension    Osteoporosis 07/12/2006   Pernicious anemia 10/25/2006   Polymyalgia rheumatica (HCC) 03/27/2007   sees Dr. Kellie Simmering in rheumatology   Vitamin D deficiency 07/12/2006    Allergies  Allergen Reactions   Aspirin Other (See Comments)    "threw up blood"   Codeine Other (See Comments)    "threw up blood"   Naproxen Other (See Comments)    Severe pain in joints and hands   Oxycodone-Acetaminophen     REACTION: gastric reaction    ROS General: Denies fever, chills, night sweats, changes in weight, changes in appetite HEENT: Denies headaches, ear pain, changes in vision, rhinorrhea, sore throat CV: Denies CP, palpitations, SOB, orthopnea Pulm: Denies SOB, cough, wheezing GI: Denies abdominal pain, nausea, vomiting, diarrhea, constipation GU: Denies dysuria, hematuria, frequency, vaginal discharge Msk: Denies muscle cramps, joint pains  + right ankle wound Neuro: Denies weakness, numbness, tingling Skin: Denies rashes, bruising Psych: Denies depression, anxiety, hallucinations     Objective:    Blood pressure 138/68, pulse 70,  temperature 98 F (36.7 C), temperature source Oral, weight 103 lb (46.7 kg), SpO2 92 %.  Gen. Pleasant, well-nourished, in no distress, normal affect   HEENT: [place]/AT, face symmetric, conjunctiva clear, no scleral icterus, PERRLA, EOMI, nares patent without drainage.   Lungs: no accessory muscle use, CTAB, no wheezes or rales Cardiovascular: RRR, no peripheral edema Musculoskeletal: No deformities, no cyanosis or clubbing, normal tone Neuro:  A&Ox3, CN II-XII intact, normal gait Skin:  Warm, dry, thin skin.  Skin tear to R medial ankle with increased warmth, mild sanguinous drainage, no induration.      Wt Readings from Last 3 Encounters:  07/27/20 103 lb (46.7 kg)  12/07/19 105 lb 14.4 oz (48 kg)  10/26/19 106 lb 8 oz (48.3 kg)    Lab Results  Component Value Date   WBC 7.6 12/07/2019   HGB 14.5 12/07/2019   HCT 42.9 12/07/2019   PLT 204 12/07/2019   GLUCOSE 100 (H) 12/07/2019   CHOL 188 12/07/2019   TRIG 107 12/07/2019   HDL 88 12/07/2019   LDLDIRECT 115.5 09/08/2012   LDLCALC 80 12/07/2019   ALT 10 12/07/2019   AST 21 12/07/2019   NA 142 12/07/2019   K 4.8 12/07/2019   CL 103 12/07/2019   CREATININE 0.95 (H) 12/07/2019   BUN 13 12/07/2019   CO2 28 12/07/2019   TSH 1.50 12/07/2019   INR 0.98 02/11/2017   HGBA1C 5.8 07/21/2015  Assessment/Plan:  Skin tear of right lower leg without complication, initial encounter -keep area clean and dry. -wound care  Cellulitis of right lower extremity  - Plan: amoxicillin-clavulanate (AUGMENTIN) 500-125 MG tablet  Need for tetanus, diphtheria, and acellular pertussis (Tdap) vaccine  - Plan: Tdap vaccine greater than or equal to 7yo IM  F/u prn  Abbe Amsterdam, MD

## 2020-08-02 ENCOUNTER — Encounter: Payer: Self-pay | Admitting: Family Medicine

## 2020-08-02 ENCOUNTER — Other Ambulatory Visit: Payer: Self-pay

## 2020-08-03 ENCOUNTER — Ambulatory Visit (INDEPENDENT_AMBULATORY_CARE_PROVIDER_SITE_OTHER): Payer: Medicare Other | Admitting: Family Medicine

## 2020-08-03 ENCOUNTER — Encounter: Payer: Self-pay | Admitting: Family Medicine

## 2020-08-03 VITALS — BP 130/60 | HR 112 | Temp 98.5°F | Wt 103.0 lb

## 2020-08-03 DIAGNOSIS — S81811D Laceration without foreign body, right lower leg, subsequent encounter: Secondary | ICD-10-CM

## 2020-08-03 DIAGNOSIS — L03115 Cellulitis of right lower limb: Secondary | ICD-10-CM

## 2020-08-03 NOTE — Progress Notes (Signed)
Subjective:    Patient ID: [name], female    DOB: Dec 18, 1928, 87 y.o.   MRN: [id number]  Chief Complaint  Patient presents with   Follow-up    Rt lower leg.     HPI Patient was seen today for f/u on skin tear.  Skin tear occurred 3 wks ago after dropping a glass bowel cutting R ankle.  Seen on 07/27/20, started on abx.  Completed Augmentin today.  States leg is no longer throbbing at night.  Denies purulent drainage, fever, chills, erythema, or edema.  Past Medical History:  Diagnosis Date   Alcohol abuse    Emphysema of lung (HCC) 01/26/2009   pt. unsure of this reports as of 07/31/2012   Fracture of fifth metatarsal bone of right foot 08/29/2012   Gout 08/11/2012   Hyperlipemia 03/27/2007   Hypertension    Osteoporosis 07/12/2006   Pernicious anemia 10/25/2006   Polymyalgia rheumatica (HCC) 03/27/2007   sees Dr. Kellie Simmering in rheumatology   Vitamin D deficiency 07/12/2006    Allergies  Allergen Reactions   Aspirin Other (See Comments)    "threw up blood"   Codeine Other (See Comments)    "threw up blood"   Naproxen Other (See Comments)    Severe pain in joints and hands   Oxycodone-Acetaminophen     REACTION: gastric reaction    ROS General: Denies fever, chills, night sweats, changes in weight, changes in appetite HEENT: Denies headaches, ear pain, changes in vision, rhinorrhea, sore throat CV: Denies CP, palpitations, SOB, orthopnea Pulm: Denies SOB, cough, wheezing GI: Denies abdominal pain, nausea, vomiting, diarrhea, constipation GU: Denies dysuria, hematuria, frequency, vaginal discharge Msk: Denies muscle cramps, joint pains Neuro: Denies weakness, numbness, tingling Skin: Denies rashes, bruising  +skin tear Psych: Denies depression, anxiety, hallucinations     Objective:    Blood pressure 130/60, pulse (!) 112, temperature 98.5 F (36.9 C), temperature source Oral, weight 103 lb (46.7 kg), SpO2 95 %.  Gen. Pleasant, well-nourished, in no distress, normal  affect   HEENT: [place]/AT, face symmetric, conjunctiva clear, no scleral icterus, PERRLA, EOMI, nares patent without drainage Lungs: no accessory muscle use Cardiovascular: tachycardia, no peripheral edema. Musculoskeletal: No deformities, no cyanosis or clubbing, normal tone Neuro:  A&Ox3, CN II-XII intact, ambulating with a cane Skin:  Warm, dry, intact.  Adhesive bandage removed from R medial ankle with minimal dried sanguinous fluid noted on gauze.  No drainage expressed from wound, eschar in place, mild tenderness on lateral edge of wound.  No erythema, induration, or streaking noted.   Wt Readings from Last 3 Encounters:  08/03/20 103 lb (46.7 kg)  07/27/20 103 lb (46.7 kg)  07/27/20 103 lb (46.7 kg)    Lab Results  Component Value Date   WBC 7.6 12/07/2019   HGB 14.5 12/07/2019   HCT 42.9 12/07/2019   PLT 204 12/07/2019   GLUCOSE 100 (H) 12/07/2019   CHOL 188 12/07/2019   TRIG 107 12/07/2019   HDL 88 12/07/2019   LDLDIRECT 115.5 09/08/2012   LDLCALC 80 12/07/2019   ALT 10 12/07/2019   AST 21 12/07/2019   NA 142 12/07/2019   K 4.8 12/07/2019   CL 103 12/07/2019   CREATININE 0.95 (H) 12/07/2019   BUN 13 12/07/2019   CO2 28 12/07/2019   TSH 1.50 12/07/2019   INR 0.98 02/11/2017   HGBA1C 5.8 07/21/2015    Assessment/Plan:  Skin tear of right lower leg without complication, subsequent encounter  Cellulitis of right lower extremity -  resolved  S/p augmentin x 7 days.  Continue supportive care including keeping area clean and dry.  Advised to leave wound open to air during day.  Discussed s/s of infection.  Given strict precautions.  F/u prn  Abbe Amsterdam, MD

## 2020-08-17 ENCOUNTER — Ambulatory Visit (INDEPENDENT_AMBULATORY_CARE_PROVIDER_SITE_OTHER): Payer: Medicare Other | Admitting: Family Medicine

## 2020-08-17 ENCOUNTER — Other Ambulatory Visit: Payer: Self-pay

## 2020-08-17 ENCOUNTER — Encounter: Payer: Self-pay | Admitting: Family Medicine

## 2020-08-17 ENCOUNTER — Telehealth: Payer: Self-pay | Admitting: Family Medicine

## 2020-08-17 VITALS — BP 108/78 | HR 78 | Temp 97.7°F

## 2020-08-17 DIAGNOSIS — I499 Cardiac arrhythmia, unspecified: Secondary | ICD-10-CM

## 2020-08-17 DIAGNOSIS — L03115 Cellulitis of right lower limb: Secondary | ICD-10-CM | POA: Diagnosis not present

## 2020-08-17 DIAGNOSIS — T148XXA Other injury of unspecified body region, initial encounter: Secondary | ICD-10-CM | POA: Diagnosis not present

## 2020-08-17 LAB — CBC WITH DIFFERENTIAL/PLATELET
Basophils Absolute: 0 10*3/uL (ref 0.0–0.1)
Basophils Relative: 0.2 % (ref 0.0–3.0)
Eosinophils Absolute: 0 10*3/uL (ref 0.0–0.7)
Eosinophils Relative: 0.4 % (ref 0.0–5.0)
HCT: 42.4 % (ref 36.0–46.0)
Hemoglobin: 14.1 g/dL (ref 12.0–15.0)
Lymphocytes Relative: 7.2 % — ABNORMAL LOW (ref 12.0–46.0)
Lymphs Abs: 0.8 10*3/uL (ref 0.7–4.0)
MCHC: 33.3 g/dL (ref 30.0–36.0)
MCV: 97.7 fl (ref 78.0–100.0)
Monocytes Absolute: 0.7 10*3/uL (ref 0.1–1.0)
Monocytes Relative: 5.9 % (ref 3.0–12.0)
Neutro Abs: 9.7 10*3/uL — ABNORMAL HIGH (ref 1.4–7.7)
Neutrophils Relative %: 86.3 % — ABNORMAL HIGH (ref 43.0–77.0)
Platelets: 216 10*3/uL (ref 150.0–400.0)
RBC: 4.34 Mil/uL (ref 3.87–5.11)
RDW: 13.8 % (ref 11.5–15.5)
WBC: 11.2 10*3/uL — ABNORMAL HIGH (ref 4.0–10.5)

## 2020-08-17 LAB — COMPREHENSIVE METABOLIC PANEL
ALT: 10 U/L (ref 0–35)
AST: 20 U/L (ref 0–37)
Albumin: 4.2 g/dL (ref 3.5–5.2)
Alkaline Phosphatase: 66 U/L (ref 39–117)
BUN: 12 mg/dL (ref 6–23)
CO2: 33 mEq/L — ABNORMAL HIGH (ref 19–32)
Calcium: 9.5 mg/dL (ref 8.4–10.5)
Chloride: 100 mEq/L (ref 96–112)
Creatinine, Ser: 0.97 mg/dL (ref 0.40–1.20)
GFR: 51.5 mL/min — ABNORMAL LOW (ref >60.00)
Glucose, Bld: 91 mg/dL (ref 70–99)
Potassium: 4.1 mEq/L (ref 3.5–5.1)
Sodium: 142 mEq/L (ref 135–145)
Total Bilirubin: 0.9 mg/dL (ref 0.2–1.2)
Total Protein: 7.1 g/dL (ref 6.0–8.3)

## 2020-08-17 LAB — TSH: TSH: 2.66 u[IU]/mL (ref 0.35–5.50)

## 2020-08-17 LAB — T4, FREE: Free T4: 0.94 ng/dL (ref 0.60–1.60)

## 2020-08-17 MED ORDER — CEPHALEXIN 500 MG PO CAPS: 500.0000 mg | ORAL_CAPSULE | Freq: Two times a day (BID) | ORAL | 0 refills | Status: DC

## 2020-08-17 NOTE — Telephone Encounter (Signed)
Pts daughter is calling in due to her not being able to be at the appointment with the pt and she was wanting to know if it is okay for the pt to wait until November 07, 2020 w/Dr. Dietrich Pates @ [facility].  She did not get a clear understanding of the cardiology referral.  Pts daughter would like to have a call back.

## 2020-08-17 NOTE — Addendum Note (Signed)
Addended by: Deeann Saint on: 08/17/2020 06:11 PM   Modules accepted: Orders

## 2020-08-17 NOTE — Progress Notes (Signed)
Subjective:    Patient ID: [name], female    DOB: [birth year], 87 y.o.   MRN: [id number]  Chief Complaint  Patient presents with   Leg Injury    Right lower leg    HPI Patient was seen today for f/u on skin tear.  Initial injury occurred late June when a glass bowel cut her R ankle.  Completed augmentin 7 d course with improvement in cellulitis.  Pt states area on RLE is starting to throb and become red again.  Denies drainage.  Leaving open to air and putting neosporin on it when bandaging.  Denies fever, chills, cough, nausea, vomiting, palpitations, dizziness, headache.   Tdap up to date, given 07/27/20.  Pt states feeling well.  Pulse variable 26-78 with pO2 abnormally low during rooming.   Past Medical History:  Diagnosis Date   Alcohol abuse    Emphysema of lung (HCC) 01/26/2009   pt. unsure of this reports as of 07/31/2012   Fracture of fifth metatarsal bone of right foot 08/29/2012   Gout 08/11/2012   Hyperlipemia 03/27/2007   Hypertension    Osteoporosis 07/12/2006   Pernicious anemia 10/25/2006   Polymyalgia rheumatica (HCC) 03/27/2007   sees Dr. Kellie Simmering in rheumatology   Vitamin D deficiency 07/12/2006    Allergies  Allergen Reactions   Aspirin Other (See Comments)    "threw up blood"   Codeine Other (See Comments)    "threw up blood"   Naproxen Other (See Comments)    Severe pain in joints and hands   Oxycodone-Acetaminophen     REACTION: gastric reaction    ROS General: Denies fever, chills, night sweats, changes in weight, changes in appetite HEENT: Denies headaches, ear pain, changes in vision, rhinorrhea, sore throat +hard of hearing CV: Denies CP, palpitations, SOB, orthopnea Pulm: Denies SOB, cough, wheezing GI: Denies abdominal pain, nausea, vomiting, diarrhea, constipation GU: Denies dysuria, hematuria, frequency, vaginal discharge Msk: Denies muscle cramps, joint pains  Neuro: Denies weakness, numbness, tingling Skin: Denies rashes, bruising + skin  tear Psych: Denies depression, anxiety, hallucinations     Objective:    Blood pressure 108/78, pulse 78, temperature 97.7 F (36.5 C), temperature source Oral, SpO2 (!) 58 %.  Gen. Pleasant, well-nourished, in no distress, normal affect, hard of hearing HEENT: [place]/AT, face symmetric, conjunctiva clear, no scleral icterus, PERRLA, EOMI, nares patent without drainage Lungs: no accessory muscle use, CTAB, no wheezes or rales Cardiovascular: Irregularly irregular, no m/r/g, no peripheral edema Musculoskeletal: No deformities, no cyanosis or clubbing, normal tone Neuro:  A&Ox3, CN II-XII intact, normal gait Skin:  Warm, dry.  R medial shin with moist appearing eschar in place, faint erythema surrounding skin tear.  Small amount of purulent fluid expressed from eschar.   Wt Readings from Last 3 Encounters:  08/03/20 103 lb (46.7 kg)  07/27/20 103 lb (46.7 kg)  07/27/20 103 lb (46.7 kg)    Lab Results  Component Value Date   WBC 7.6 12/07/2019   HGB 14.5 12/07/2019   HCT 42.9 12/07/2019   PLT 204 12/07/2019   GLUCOSE 100 (H) 12/07/2019   CHOL 188 12/07/2019   TRIG 107 12/07/2019   HDL 88 12/07/2019   LDLDIRECT 115.5 09/08/2012   LDLCALC 80 12/07/2019   ALT 10 12/07/2019   AST 21 12/07/2019   NA 142 12/07/2019   K 4.8 12/07/2019   CL 103 12/07/2019   CREATININE 0.95 (H) 12/07/2019   BUN 13 12/07/2019   CO2 28 12/07/2019   TSH  1.50 12/07/2019   INR 0.98 02/11/2017   HGBA1C 5.8 07/21/2015    Assessment/Plan:  Cellulitis of right lower extremity  -s/p augmentin x 7 days started 07/27/20 -start keflex -for continued or worsened symptoms, wound clinic. - Plan: cephALEXin (KEFLEX) 500 MG capsule, CBC with Differential/Platelet  Nonhealing nonsurgical wound -keep area clean and dry  Irregular heart rhythm  -pulse 41, 26, 78 during rooming and irregular on exam. -EKG with irregular R-R intervals, PR interval .16 sec. Concerning for A. Fib.  EKGs from 10/26/19 and 01/28/18  reviewed. -prior concern for possible Afib, however pt declined Cardiology appt when contacted.  Advised to schedule appt when called. -This patients CHA2DS2-VASc Score and unadjusted Ischemic Stroke Rate (% per year) is equal to 4.8 % stroke rate/year from a score of 4 Above score calculated as 1 point each if present [CHF, HTN, DM, Vascular=MI/PAD/Aortic Plaque, Age if 65-74, or Female] Above score calculated as 2 points each if present [Age > 75, or Stroke/TIA/TE]  -ASA allergy, caused hematemesis - Plan: Ambulatory referral to Cardiology, CMP, TSH, T4, Free, EKG-12 lead -Given strict precautions  F/u next wk with pcp   Abbe Amsterdam, MD

## 2020-08-18 NOTE — Telephone Encounter (Signed)
Spoke with pts daughter, informed her Dr Salomon Fick wants pt to see pcp next week at appt and discuss concerns. Pt daughter agreed.

## 2020-08-19 DIAGNOSIS — H353132 Nonexudative age-related macular degeneration, bilateral, intermediate dry stage: Secondary | ICD-10-CM | POA: Diagnosis not present

## 2020-08-19 DIAGNOSIS — Z961 Presence of intraocular lens: Secondary | ICD-10-CM | POA: Diagnosis not present

## 2020-08-19 DIAGNOSIS — H5202 Hypermetropia, left eye: Secondary | ICD-10-CM | POA: Diagnosis not present

## 2020-08-25 ENCOUNTER — Other Ambulatory Visit: Payer: Self-pay

## 2020-08-26 ENCOUNTER — Telehealth: Payer: Self-pay | Admitting: *Deleted

## 2020-08-26 ENCOUNTER — Ambulatory Visit (INDEPENDENT_AMBULATORY_CARE_PROVIDER_SITE_OTHER): Payer: Medicare Other | Admitting: Family Medicine

## 2020-08-26 ENCOUNTER — Encounter: Payer: Self-pay | Admitting: Family Medicine

## 2020-08-26 VITALS — BP 150/80 | HR 81 | Temp 97.7°F | Ht 60.25 in | Wt 102.4 lb

## 2020-08-26 DIAGNOSIS — L03116 Cellulitis of left lower limb: Secondary | ICD-10-CM | POA: Diagnosis not present

## 2020-08-26 DIAGNOSIS — S81802D Unspecified open wound, left lower leg, subsequent encounter: Secondary | ICD-10-CM | POA: Diagnosis not present

## 2020-08-26 MED ORDER — SULFAMETHOXAZOLE-TRIMETHOPRIM 800-160 MG PO TABS: 2.0000 | ORAL_TABLET | Freq: Two times a day (BID) | ORAL | 0 refills | Status: DC

## 2020-08-26 MED ORDER — MUPIROCIN 2 % EX OINT: 1.0000 "application " | TOPICAL_OINTMENT | Freq: Two times a day (BID) | CUTANEOUS | 0 refills | Status: DC

## 2020-08-26 NOTE — Telephone Encounter (Signed)
Message sent to [facility] as per PCP, please add in an appt for this patient on Monday 8/15 at 12:30pm.  Patient was advised to arrive at 12:15pm.

## 2020-08-26 NOTE — Patient Instructions (Addendum)
*  stop the keflex *start the bactrim as directed  *use the antibiotic ointment to wound twice daily - ok to wrap with gauze and non-stick bandage if you will be outside or risk dirt getting in wound.  *keep legs elevated above heart level if possible to keep swelling down. This will help with wound healing.   If any worsening of swelling, wound appearance, redness, pain then let me know.   *let me know if leg is not completely pain free at end of antibiotic course - I can give you some additional to get you to next appointment if needed.

## 2020-08-26 NOTE — Progress Notes (Addendum)
[name] DOB: 1928/08/30 Encounter date: 08/26/2020  This is a 87 y.o. female who presents for physical, but visit changed to acute care due to ongoing issues with wound.   History of present illness/Additional concerns:  Wondering if she needs to be seen sooner by cardiology (than October). Daughter wasn't with her for last appointment so not sure what issue was that prompted referral. Patient concerned with seeing oxygen reading as "58%" from last visit on AVS. She was referred for irregular heart rate, but patient feels fine, not noting abnormality, palpitations, shortness of breath.   States that leg feels awful. Weds woke up all through night with leg hurting. Leg waking her all night last night as well. Doesn't feel like it is healing properly.   States that experience with wound care is worst experience due to pain. They debrided wound with topical numbing only and it was extremely painful. She prefers not to return to them.   She is on last dose of keflex. She was treated with augmentin prior to that. Had some improvement on augmentin. Has not had improvement on keflex.    Past Medical History:  Diagnosis Date   Alcohol abuse    Emphysema of lung (HCC) 01/26/2009   pt. unsure of this reports as of 07/31/2012   Fracture of fifth metatarsal bone of right foot 08/29/2012   Gout 08/11/2012   Hyperlipemia 03/27/2007   Hypertension    Osteoporosis 07/12/2006   Pernicious anemia 10/25/2006   Polymyalgia rheumatica (HCC) 03/27/2007   sees Dr. Kellie Simmering in rheumatology   Vitamin D deficiency 07/12/2006   Past Surgical History:  Procedure Laterality Date   APPENDECTOMY  07/12/2006   LUMBAR LAMINECTOMY/DECOMPRESSION MICRODISCECTOMY Right 02/13/2017   Procedure: Microdiscectomy - right - Lumbar three-Lumbar four;  Surgeon: Tia Alert, MD;  Location: [facility] OR;  Service: Neurosurgery;  Laterality: Right;   PARTIAL HIP ARTHROPLASTY     left side- 2x's ( 2nd surgery to correct 1st  replacement), R side -    TOOTH EXTRACTION N/A 08/04/2012   Procedure: EXTRACTIONS 17, 20;  Surgeon: Georgia Lopes, DDS;  Location: [facility] OR;  Service: Oral Surgery;  Laterality: N/A;   Allergies  Allergen Reactions   Aspirin Other (See Comments)    "threw up blood"   Codeine Other (See Comments)    "threw up blood"   Naproxen Other (See Comments)    Severe pain in joints and hands   Oxycodone-Acetaminophen     REACTION: gastric reaction   Current Meds  Medication Sig   alendronate (FOSAMAX) 70 MG tablet TAKE 1 TABLET BY MOUTH  WEEKLY WITH 8 OZ OF PLAIN  WATER 30 MINUTES BEFORE  FIRST FOOD, DRINK OR MEDS.  STAY UPRIGHT FOR 30 MINS   Cyanocobalamin (VITAMIN B-12) 1000 MCG SUBL Place 1,000 mcg under the tongue daily.    diphenhydrAMINE (BENADRYL) 25 MG tablet Take 25 mg by mouth at bedtime as needed for sleep.   furosemide (LASIX) 20 MG tablet Take 1 tablet (20 mg total) by mouth daily as needed.   mupirocin ointment (BACTROBAN) 2 % Apply 1 application topically 2 (two) times daily.   Oyster Shell (OYSTER CALCIUM) 500 MG TABS tablet Take 1,000 mg of elemental calcium by mouth daily.   predniSONE (DELTASONE) 2.5 MG tablet TAKE 1 TABLET BY MOUTH  DAILY WITH BREAKFAST   sulfamethoxazole-trimethoprim (BACTRIM DS) 800-160 MG tablet Take 2 tablets by mouth 2 (two) times daily for 7 days.   vitamin C (ASCORBIC ACID) 250  MG tablet Take 500 mg by mouth daily.   [DISCONTINUED] cephALEXin (KEFLEX) 500 MG capsule Take 1 capsule (500 mg total) by mouth 2 (two) times daily for 10 days.   Social History   Tobacco Use   Smoking status: Former    Packs/day: 2.00    Years: 58.00    Pack years: 116.00    Types: Cigarettes    Start date: 01/23/1948    Quit date: 07/12/2006    Years since quitting: 14.1   Smokeless tobacco: Never  Substance Use Topics   Alcohol use: No    Alcohol/week: 10.0 standard drinks    Types: 10 Shots of liquor per week    Comment: quit completely - 2012   Family History   Problem Relation Age of Onset   Mental illness Mother        alzheimer dementia   Ulcers Father    Heart disease Father    Cancer Father        lung, smoker     Review of Systems  Constitutional:  Negative for chills, fatigue and fever.  Respiratory:  Negative for cough, chest tightness, shortness of breath and wheezing.   Cardiovascular:  Negative for palpitations and leg swelling.  Skin:  Positive for color change (more red around wound) and wound.   CBC:  Lab Results  Component Value Date   WBC 11.2 (H) 08/17/2020   HGB 14.1 08/17/2020   HCT 42.4 08/17/2020   MCH 32.7 12/07/2019   MCHC 33.3 08/17/2020   RDW 13.8 08/17/2020   PLT 216.0 08/17/2020   MPV 10.8 12/07/2019   CMP: Lab Results  Component Value Date   NA 142 08/17/2020   K 4.1 08/17/2020   CL 100 08/17/2020   CO2 33 (H) 08/17/2020   ANIONGAP 14 01/28/2018   GLUCOSE 91 08/17/2020   BUN 12 08/17/2020   CREATININE 0.97 08/17/2020   CREATININE 0.95 (H) 12/07/2019   GFRAA 37 (L) 01/28/2018   CALCIUM 9.5 08/17/2020   PROT 7.1 08/17/2020   BILITOT 0.9 08/17/2020   ALKPHOS 66 08/17/2020   ALT 10 08/17/2020   AST 20 08/17/2020   LIPID: Lab Results  Component Value Date   CHOL 188 12/07/2019   TRIG 107 12/07/2019   HDL 88 12/07/2019   LDLCALC 80 12/07/2019    Objective:  BP (!) 150/80 (BP Location: Left Arm, Patient Position: Sitting, Cuff Size: Normal)   Pulse 81   Temp 97.7 F (36.5 C) (Oral)   Ht 5' 0.25" (1.53 m)   Wt 102 lb 6.4 oz (46.4 kg)   SpO2 95%   BMI 19.83 kg/m   Weight: 102 lb 6.4 oz (46.4 kg)   BP Readings from Last 3 Encounters:  08/26/20 (!) 150/80  08/17/20 108/78  08/03/20 130/60   Wt Readings from Last 3 Encounters:  08/26/20 102 lb 6.4 oz (46.4 kg)  08/03/20 103 lb (46.7 kg)  07/27/20 103 lb (46.7 kg)    Physical Exam Constitutional:      General: She is not in acute distress.    Appearance: She is well-developed.  Cardiovascular:     Rate and Rhythm: Normal  rate and regular rhythm. FrequentExtrasystoles are present.    Heart sounds: Normal heart sounds. No murmur heard.   No friction rub.  Pulmonary:     Effort: Pulmonary effort is normal. No respiratory distress.     Breath sounds: Normal breath sounds. No wheezing or rales.  Musculoskeletal:     Right lower  leg: No edema (trace).     Left lower leg: 1+ Edema present.  Skin:    Comments: 2+ pedal pulses bilat feet. Venous stasis discoloration bilat LE. There is tenderness to palpation around wound. There is edema LLE; see picture. There is serosanguinous drainage from wound with neovascularization.   Neurological:     Mental Status: She is alert and oriented to person, place, and time.  Psychiatric:        Behavior: Behavior normal.     Assessment/Plan: Health Maintenance Due  Topic Date Due   Zoster Vaccines- Shingrix (1 of 2) Never done   INFLUENZA VACCINE  08/22/2020   1. Open wound of left lower leg, subsequent encounter  2. Cellulitis of left lower extremity  *stop the keflex *start the bactrim as directed  *use the antibiotic ointment to wound twice daily - ok to wrap with gauze and non-stick bandage if you will be outside or risk dirt getting in wound.  *keep legs elevated above heart level if possible to keep swelling down. This will help with wound healing.   If any worsening of swelling, wound appearance, redness, pain then let me know.   *let me know if leg is not completely pain free at end of antibiotic course - I can give you some additional to get you to next appointment if needed.   I did advise ok to wait for October for cardio visit. Ekg reviewed.   Return in about 10 days (around 09/05/2020).   42 minutes spent in chart review, exam, discussion of wound care, charting, follow up plan.  Theodis Shove, MD

## 2020-08-30 ENCOUNTER — Telehealth (INDEPENDENT_AMBULATORY_CARE_PROVIDER_SITE_OTHER): Payer: Medicare Other | Admitting: Family Medicine

## 2020-08-30 ENCOUNTER — Encounter (HOSPITAL_BASED_OUTPATIENT_CLINIC_OR_DEPARTMENT_OTHER): Payer: Self-pay | Admitting: *Deleted

## 2020-08-30 ENCOUNTER — Inpatient Hospital Stay (HOSPITAL_BASED_OUTPATIENT_CLINIC_OR_DEPARTMENT_OTHER)
Admission: EM | Admit: 2020-08-30 | Discharge: 2020-09-02 | DRG: 682 | Disposition: A | Discharge status: Skilled Nursing Facility | Payer: Medicare Other | Attending: Internal Medicine | Admitting: Internal Medicine

## 2020-08-30 ENCOUNTER — Other Ambulatory Visit: Payer: Self-pay

## 2020-08-30 ENCOUNTER — Encounter: Payer: Self-pay | Admitting: Family Medicine

## 2020-08-30 ENCOUNTER — Emergency Department (HOSPITAL_BASED_OUTPATIENT_CLINIC_OR_DEPARTMENT_OTHER): Payer: Medicare Other

## 2020-08-30 DIAGNOSIS — Z79899 Other long term (current) drug therapy: Secondary | ICD-10-CM | POA: Diagnosis not present

## 2020-08-30 DIAGNOSIS — R262 Difficulty in walking, not elsewhere classified: Secondary | ICD-10-CM | POA: Diagnosis not present

## 2020-08-30 DIAGNOSIS — I639 Cerebral infarction, unspecified: Secondary | ICD-10-CM | POA: Diagnosis not present

## 2020-08-30 DIAGNOSIS — Z7952 Long term (current) use of systemic steroids: Secondary | ICD-10-CM | POA: Diagnosis not present

## 2020-08-30 DIAGNOSIS — R443 Hallucinations, unspecified: Secondary | ICD-10-CM

## 2020-08-30 DIAGNOSIS — R531 Weakness: Secondary | ICD-10-CM

## 2020-08-30 DIAGNOSIS — R609 Edema, unspecified: Secondary | ICD-10-CM | POA: Diagnosis not present

## 2020-08-30 DIAGNOSIS — R945 Abnormal results of liver function studies: Secondary | ICD-10-CM

## 2020-08-30 DIAGNOSIS — E860 Dehydration: Secondary | ICD-10-CM | POA: Diagnosis present

## 2020-08-30 DIAGNOSIS — Z886 Allergy status to analgesic agent status: Secondary | ICD-10-CM | POA: Diagnosis not present

## 2020-08-30 DIAGNOSIS — S81802A Unspecified open wound, left lower leg, initial encounter: Secondary | ICD-10-CM | POA: Diagnosis not present

## 2020-08-30 DIAGNOSIS — E785 Hyperlipidemia, unspecified: Secondary | ICD-10-CM | POA: Diagnosis not present

## 2020-08-30 DIAGNOSIS — M818 Other osteoporosis without current pathological fracture: Secondary | ICD-10-CM | POA: Diagnosis not present

## 2020-08-30 DIAGNOSIS — Z681 Body mass index (BMI) 19 or less, adult: Secondary | ICD-10-CM | POA: Diagnosis not present

## 2020-08-30 DIAGNOSIS — R410 Disorientation, unspecified: Secondary | ICD-10-CM

## 2020-08-30 DIAGNOSIS — Z7401 Bed confinement status: Secondary | ICD-10-CM | POA: Diagnosis not present

## 2020-08-30 DIAGNOSIS — L97909 Non-pressure chronic ulcer of unspecified part of unspecified lower leg with unspecified severity: Secondary | ICD-10-CM | POA: Diagnosis not present

## 2020-08-30 DIAGNOSIS — R404 Transient alteration of awareness: Secondary | ICD-10-CM | POA: Diagnosis not present

## 2020-08-30 DIAGNOSIS — Z8249 Family history of ischemic heart disease and other diseases of the circulatory system: Secondary | ICD-10-CM | POA: Diagnosis not present

## 2020-08-30 DIAGNOSIS — Z7983 Long term (current) use of bisphosphonates: Secondary | ICD-10-CM

## 2020-08-30 DIAGNOSIS — R7401 Elevation of levels of liver transaminase levels: Secondary | ICD-10-CM | POA: Diagnosis not present

## 2020-08-30 DIAGNOSIS — Z20822 Contact with and (suspected) exposure to covid-19: Secondary | ICD-10-CM | POA: Diagnosis not present

## 2020-08-30 DIAGNOSIS — H9193 Unspecified hearing loss, bilateral: Secondary | ICD-10-CM | POA: Diagnosis not present

## 2020-08-30 DIAGNOSIS — N179 Acute kidney failure, unspecified: Secondary | ICD-10-CM | POA: Diagnosis not present

## 2020-08-30 DIAGNOSIS — E440 Moderate protein-calorie malnutrition: Secondary | ICD-10-CM | POA: Diagnosis not present

## 2020-08-30 DIAGNOSIS — Z820 Family history of epilepsy and other diseases of the nervous system: Secondary | ICD-10-CM

## 2020-08-30 DIAGNOSIS — S91001D Unspecified open wound, right ankle, subsequent encounter: Secondary | ICD-10-CM | POA: Diagnosis not present

## 2020-08-30 DIAGNOSIS — G9341 Metabolic encephalopathy: Secondary | ICD-10-CM | POA: Diagnosis not present

## 2020-08-30 DIAGNOSIS — M179 Osteoarthritis of knee, unspecified: Secondary | ICD-10-CM | POA: Diagnosis not present

## 2020-08-30 DIAGNOSIS — Z885 Allergy status to narcotic agent status: Secondary | ICD-10-CM

## 2020-08-30 DIAGNOSIS — I10 Essential (primary) hypertension: Secondary | ICD-10-CM | POA: Diagnosis present

## 2020-08-30 DIAGNOSIS — R5381 Other malaise: Secondary | ICD-10-CM | POA: Diagnosis not present

## 2020-08-30 DIAGNOSIS — M353 Polymyalgia rheumatica: Secondary | ICD-10-CM | POA: Diagnosis not present

## 2020-08-30 DIAGNOSIS — J439 Emphysema, unspecified: Secondary | ICD-10-CM | POA: Diagnosis not present

## 2020-08-30 DIAGNOSIS — S81801A Unspecified open wound, right lower leg, initial encounter: Secondary | ICD-10-CM | POA: Diagnosis present

## 2020-08-30 DIAGNOSIS — R42 Dizziness and giddiness: Secondary | ICD-10-CM | POA: Diagnosis not present

## 2020-08-30 DIAGNOSIS — M810 Age-related osteoporosis without current pathological fracture: Secondary | ICD-10-CM | POA: Diagnosis present

## 2020-08-30 DIAGNOSIS — D510 Vitamin B12 deficiency anemia due to intrinsic factor deficiency: Secondary | ICD-10-CM | POA: Diagnosis present

## 2020-08-30 DIAGNOSIS — K8020 Calculus of gallbladder without cholecystitis without obstruction: Secondary | ICD-10-CM | POA: Diagnosis not present

## 2020-08-30 DIAGNOSIS — L0390 Cellulitis, unspecified: Secondary | ICD-10-CM | POA: Diagnosis not present

## 2020-08-30 DIAGNOSIS — R7989 Other specified abnormal findings of blood chemistry: Secondary | ICD-10-CM

## 2020-08-30 DIAGNOSIS — Z818 Family history of other mental and behavioral disorders: Secondary | ICD-10-CM | POA: Diagnosis not present

## 2020-08-30 DIAGNOSIS — M1711 Unilateral primary osteoarthritis, right knee: Secondary | ICD-10-CM | POA: Diagnosis not present

## 2020-08-30 DIAGNOSIS — R5383 Other fatigue: Secondary | ICD-10-CM | POA: Diagnosis not present

## 2020-08-30 DIAGNOSIS — K8050 Calculus of bile duct without cholangitis or cholecystitis without obstruction: Secondary | ICD-10-CM | POA: Diagnosis not present

## 2020-08-30 DIAGNOSIS — M109 Gout, unspecified: Secondary | ICD-10-CM | POA: Diagnosis not present

## 2020-08-30 DIAGNOSIS — I70212 Atherosclerosis of native arteries of extremities with intermittent claudication, left leg: Secondary | ICD-10-CM | POA: Diagnosis not present

## 2020-08-30 LAB — RESP PANEL BY RT-PCR (FLU A&B, COVID) ARPGX2
Influenza A by PCR: NEGATIVE
Influenza B by PCR: NEGATIVE
SARS Coronavirus 2 by RT PCR: NEGATIVE

## 2020-08-30 LAB — COMPREHENSIVE METABOLIC PANEL
ALT: 28 U/L (ref 0–44)
AST: 93 U/L — ABNORMAL HIGH (ref 15–41)
Albumin: 4.2 g/dL (ref 3.5–5.0)
Alkaline Phosphatase: 59 U/L (ref 38–126)
Anion gap: 13 (ref 5–15)
BUN: 23 mg/dL (ref 8–23)
CO2: 24 mmol/L (ref 22–32)
Calcium: 9.3 mg/dL (ref 8.9–10.3)
Chloride: 95 mmol/L — ABNORMAL LOW (ref 98–111)
Creatinine, Ser: 1.55 mg/dL — ABNORMAL HIGH (ref 0.44–1.00)
GFR, Estimated: 32 mL/min — ABNORMAL LOW (ref >60)
Glucose, Bld: 84 mg/dL (ref 70–99)
Potassium: 4.3 mmol/L (ref 3.5–5.1)
Sodium: 132 mmol/L — ABNORMAL LOW (ref 135–145)
Total Bilirubin: 0.3 mg/dL (ref 0.3–1.2)
Total Protein: 7.4 g/dL (ref 6.5–8.1)

## 2020-08-30 LAB — URINALYSIS, ROUTINE W REFLEX MICROSCOPIC
Glucose, UA: NEGATIVE mg/dL
Hgb urine dipstick: NEGATIVE
Ketones, ur: 15 mg/dL — AB
Leukocytes,Ua: NEGATIVE
Nitrite: NEGATIVE
Protein, ur: NEGATIVE mg/dL
Specific Gravity, Urine: >1.03 — ABNORMAL HIGH (ref 1.005–1.030)
pH: 6 (ref 5.0–8.0)

## 2020-08-30 LAB — CBC WITH DIFFERENTIAL/PLATELET
Abs Immature Granulocytes: 0.02 10*3/uL (ref 0.00–0.07)
Basophils Absolute: 0 10*3/uL (ref 0.0–0.1)
Basophils Relative: 0 %
Eosinophils Absolute: 0 10*3/uL (ref 0.0–0.5)
Eosinophils Relative: 0 %
HCT: 41.6 % (ref 36.0–46.0)
Hemoglobin: 14.5 g/dL (ref 12.0–15.0)
Immature Granulocytes: 0 %
Lymphocytes Relative: 14 %
Lymphs Abs: 1.1 10*3/uL (ref 0.7–4.0)
MCH: 33 pg (ref 26.0–34.0)
MCHC: 34.9 g/dL (ref 30.0–36.0)
MCV: 94.8 fL (ref 80.0–100.0)
Monocytes Absolute: 0.7 10*3/uL (ref 0.1–1.0)
Monocytes Relative: 8 %
Neutro Abs: 6.1 10*3/uL (ref 1.7–7.7)
Neutrophils Relative %: 78 %
Platelets: 257 10*3/uL (ref 150–400)
RBC: 4.39 MIL/uL (ref 3.87–5.11)
RDW: 13.2 % (ref 11.5–15.5)
WBC: 7.9 10*3/uL (ref 4.0–10.5)
nRBC: 0 % (ref 0.0–0.2)

## 2020-08-30 LAB — LACTIC ACID, PLASMA
Lactic Acid, Venous: 1.8 mmol/L (ref 0.5–1.9)
Lactic Acid, Venous: 0.9 mmol/L (ref 0.5–1.9)

## 2020-08-30 MED ORDER — SODIUM CHLORIDE 0.9 % IV SOLN: Freq: Once | INTRAVENOUS | Status: AC

## 2020-08-30 NOTE — ED Notes (Signed)
One set of blood cultures sent to lab to hold

## 2020-08-30 NOTE — ED Triage Notes (Signed)
She has been on 3 different antibiotics for wound to her right lower leg. She is here today with confusion, dizziness, weakness x 3 days. Not eating or drinking. Symptoms started after new antibiotic was started.

## 2020-08-30 NOTE — ED Notes (Signed)
IN/OUT cath performed.  Me and Misty, RN at bedside. Pt tolerated well

## 2020-08-30 NOTE — ED Notes (Signed)
Patient transported to CT/Xray. 

## 2020-08-30 NOTE — Progress Notes (Signed)
Virtual Visit via Video Note  I connected with [name]  on 08/30/20 at  3:20 PM EDT by a video enabled telemedicine application and verified that I am speaking with the correct person using two identifiers.  Location patient: home, [place] Location provider:work or home office Persons participating in the virtual visit: patient, provider, grand-daughter and daughter are with her as well  I discussed the limitations of evaluation and management by telemedicine and the availability of in person appointments. The patient expressed understanding and agreed to proceed.   HPI:  Acute telemedicine visit for AMS/disorientation: -Onset: 2 days ago -Symptoms include: sleeping a lot, confusions - some visual hallucinations (thought her daughter's arm was peeling), saying odd things, unclear at times what day it is, weakness, balance issues, pt can not get out of bed - daughter has to hold her to get her to the bathroom, patient is not wanting to eat or drink, daughter has had trouble getting fluids in her -now daughter feels patient has worsened to the point that it is unsafe even to get her to the bathroom with assistance because she is so weak -daughter is worried about her heart as she has had A. Fib in the past - reports EMS did not do an EKG when they assessed her -EMS came to their house yesterday and vitals were ok so she opted not to go to the hospital -Denies:CP, SOB, HA, dysuria -of note, per record seen by PCP on 8/5 with worsening of LE wound and was transitioned from keflex to bactrim - daughter reports the wound seems to be improving -Pertinent medication allergies:  Allergies  Allergen Reactions   Aspirin Other (See Comments)    "threw up blood"   Codeine Other (See Comments)    "threw up blood"   Naproxen Other (See Comments)    Severe pain in joints and hands   Oxycodone-Acetaminophen     REACTION: gastric reaction    ROS: See pertinent positives and negatives per HPI.  Past  Medical History:  Diagnosis Date   Alcohol abuse    Emphysema of lung (HCC) 01/26/2009   pt. unsure of this reports as of 07/31/2012   Fracture of fifth metatarsal bone of right foot 08/29/2012   Gout 08/11/2012   Hyperlipemia 03/27/2007   Hypertension    Osteoporosis 07/12/2006   Pernicious anemia 10/25/2006   Polymyalgia rheumatica (HCC) 03/27/2007   sees Dr. Kellie Simmering in rheumatology   Vitamin D deficiency 07/12/2006    Past Surgical History:  Procedure Laterality Date   APPENDECTOMY  07/12/2006   LUMBAR LAMINECTOMY/DECOMPRESSION MICRODISCECTOMY Right 02/13/2017   Procedure: Microdiscectomy - right - Lumbar three-Lumbar four;  Surgeon: Tia Alert, MD;  Location: [facility] OR;  Service: Neurosurgery;  Laterality: Right;   PARTIAL HIP ARTHROPLASTY     left side- 2x's ( 2nd surgery to correct 1st replacement), R side -    TOOTH EXTRACTION N/A 08/04/2012   Procedure: EXTRACTIONS 17, 20;  Surgeon: Georgia Lopes, DDS;  Location: [facility] OR;  Service: Oral Surgery;  Laterality: N/A;     Current Outpatient Medications:    alendronate (FOSAMAX) 70 MG tablet, TAKE 1 TABLET BY MOUTH  WEEKLY WITH 8 OZ OF PLAIN  WATER 30 MINUTES BEFORE  FIRST FOOD, DRINK OR MEDS.  STAY UPRIGHT FOR 30 MINS, Disp: 12 tablet, Rfl: 3   Cyanocobalamin (VITAMIN B-12) 1000 MCG SUBL, Place 1,000 mcg under the tongue daily. , Disp: , Rfl:    diphenhydrAMINE (BENADRYL) 25 MG tablet, Take 25  mg by mouth at bedtime as needed for sleep., Disp: , Rfl:    furosemide (LASIX) 20 MG tablet, Take 1 tablet (20 mg total) by mouth daily as needed., Disp: 30 tablet, Rfl: 0   mupirocin ointment (BACTROBAN) 2 %, Apply 1 application topically 2 (two) times daily., Disp: 22 g, Rfl: 0   Oyster Shell (OYSTER CALCIUM) 500 MG TABS tablet, Take 1,000 mg of elemental calcium by mouth daily., Disp: , Rfl:    predniSONE (DELTASONE) 2.5 MG tablet, TAKE 1 TABLET BY MOUTH  DAILY WITH BREAKFAST, Disp: 90 tablet, Rfl: 3   sulfamethoxazole-trimethoprim (BACTRIM DS)  800-160 MG tablet, Take 2 tablets by mouth 2 (two) times daily for 7 days., Disp: 28 tablet, Rfl: 0   vitamin C (ASCORBIC ACID) 250 MG tablet, Take 500 mg by mouth daily., Disp: , Rfl:   EXAM:  VITALS per patient if applicable:  GENERAL: patient lying in bed asleep, arrousable, not answering questions - daughter answers questions  HEENT: atraumatic, conjunttiva clear, no obvious abnormalities on inspection of external nose and ears  NECK: normal movements of the head and neck  LUNGS: on inspection no signs of respiratory distress, breathing rate appears normal, no obvious gross SOB, gasping or wheezing  CV: no obvious cyanosis  SKIN: wound on ant shin of L leg, not able to assess well given video visit  MS: moves all visible extremities without noticeable abnormality  PSYCH/NEURO: asleep most of visit, daughter able to wake her up but daughter does most of the talking, she says she is "not good" when asked how she is doing, when I ask her what is bothering her or if anything hurts she says "nothing"  ASSESSMENT AND PLAN:  Discussed the following assessment and plan:  Weakness  Disorientated  Hallucination  Wound of left lower extremity, initial encounter  -we had a lengthy discussion regarding possible serious and likely etiologies, options for evaluation and workup, limitations of telemedicine visit vs in person visit, treatment, treatment risks and precautions. Advised given the nature of her symptoms needs prompt in-person evaluation and possible hospital admission if they feel she is not even safe to help to the restroom. Daughter prefers to go by private vehicle if they can get several family members to help her to the car. She reports she will call 911 if they are not able to assist her to the vehicle or feel unsafe doing so, and they will take her to [facility] or ER.  Advised to seek prompt in person care if worsening, new symptoms arise, or if is not improving with  treatment. Discussed options for inperson care if PCP office not available. Did let this patient know that I only do telemedicine on Tuesdays and Thursdays for [place]. Advised to schedule follow up visit with PCP or [facility] if any further questions or concerns to avoid delays in care.   I discussed the assessment and treatment plan with the patient.  Spent over 20 minutes on this visit on review of past medical history, review of last doctor visit, evaluation and discussing options for evaluation at a higher level of care and treatment.  The patient and daughter were  provided an opportunity to ask questions and all were answered. The patient agreed with the plan and demonstrated an understanding of the instructions.     Terressa Koyanagi, DO

## 2020-08-30 NOTE — Patient Instructions (Signed)
Seek prompt in person medical care today as we discussed.  Please call 911 if she is worsening, you feel unsafe transporting her on your own or she is having severe or life-threatening symptoms.   I hope you are feeling better soon!  Seek in person care promptly if your symptoms worsen, new concerns arise or you are not improving with treatment.  It was nice to meet you today. I help [place] out with telemedicine visits on Tuesdays and Thursdays and am available for visits on those days. If you have any concerns or questions following this visit please schedule a follow up visit with your Primary Care doctor or seek care at a local urgent care clinic to avoid delays in care.

## 2020-08-30 NOTE — ED Provider Notes (Signed)
[facility] EMERGENCY DEPARTMENT Provider Note   CSN: [id number] Arrival date & time: 08/30/20  1949     History Chief Complaint  Patient presents with   Weakness    [name] is a 87 y.o. female.  Pt presents to the ED today with weakness.  Pt has been dealing with a chronic wound to her RLE for several months.  She is on her 3rd antibiotic.  She was put on Bactrim DS (2 pills bid) on Friday, August 5th.  Since then, she has been extremely weak.  She normally lives alone with her family checking on her frequently.  Her daughter has been staying with her since the 5th and said she is no longer able to walk by herself.  She has had some hallucinations and has not had an appetite.  Right leg wound looks better.      Past Medical History:  Diagnosis Date   Alcohol abuse    Emphysema of lung (HCC) 01/26/2009   pt. unsure of this reports as of 07/31/2012   Fracture of fifth metatarsal bone of right foot 08/29/2012   Gout 08/11/2012   Hyperlipemia 03/27/2007   Hypertension    Osteoporosis 07/12/2006   Pernicious anemia 10/25/2006   Polymyalgia rheumatica (HCC) 03/27/2007   sees Dr. Kellie Simmering in rheumatology   Vitamin D deficiency 07/12/2006    Patient Active Problem List   Diagnosis Date Noted   ARF (acute renal failure) (HCC) 01/28/2018   S/P lumbar laminectomy 02/13/2017   Hearing loss of both ears 06/04/2012   Vitamin D deficiency 06/30/2009   POLYMYALGIA RHEUMATICA 03/27/2007   Pernicious anemia 10/25/2006   Essential hypertension 07/12/2006   Osteoporosis 07/12/2006    Past Surgical History:  Procedure Laterality Date   APPENDECTOMY  07/12/2006   LUMBAR LAMINECTOMY/DECOMPRESSION MICRODISCECTOMY Right 02/13/2017   Procedure: Microdiscectomy - right - Lumbar three-Lumbar four;  Surgeon: Tia Alert, MD;  Location: [facility] OR;  Service: Neurosurgery;  Laterality: Right;   PARTIAL HIP ARTHROPLASTY     left side- 2x's ( 2nd surgery to correct 1st replacement), R side  -    TOOTH EXTRACTION N/A 08/04/2012   Procedure: EXTRACTIONS 17, 20;  Surgeon: Georgia Lopes, DDS;  Location: [facility] OR;  Service: Oral Surgery;  Laterality: N/A;     OB History   No obstetric history on file.     Family History  Problem Relation Age of Onset   Mental illness Mother        alzheimer dementia   Ulcers Father    Heart disease Father    Cancer Father        lung, smoker    Social History   Tobacco Use   Smoking status: Former    Packs/day: 2.00    Years: 58.00    Pack years: 116.00    Types: Cigarettes    Start date: 01/23/1946    Quit date: 07/12/2006    Years since quitting: 14.1   Smokeless tobacco: Never  Vaping Use   Vaping Use: Never used  Substance Use Topics   Alcohol use: No    Alcohol/week: 10.0 standard drinks    Types: 10 Shots of liquor per week    Comment: quit completely - 2012   Drug use: No    Home Medications Prior to Admission medications   Medication Sig Start Date End Date Taking? Authorizing Provider  alendronate (FOSAMAX) 70 MG tablet TAKE 1 TABLET BY MOUTH  WEEKLY WITH 8 OZ OF PLAIN  WATER 30 MINUTES BEFORE  FIRST FOOD, DRINK OR MEDS.  STAY UPRIGHT FOR 30 MINS 07/11/20   Koberlein, Paris Lore, MD  Cyanocobalamin (VITAMIN B-12) 1000 MCG SUBL Place 1,000 mcg under the tongue daily.     [provider]  diphenhydrAMINE (BENADRYL) 25 MG tablet Take 25 mg by mouth at bedtime as needed for sleep.    [provider]  furosemide (LASIX) 20 MG tablet Take 1 tablet (20 mg total) by mouth daily as needed. 09/26/18   [name], MD  mupirocin ointment (BACTROBAN) 2 % Apply 1 application topically 2 (two) times daily. 08/26/20   [name], MD  Oyster Shell (OYSTER CALCIUM) 500 MG TABS tablet Take 1,000 mg of elemental calcium by mouth daily.    [provider]  predniSONE (DELTASONE) 2.5 MG tablet TAKE 1 TABLET BY MOUTH  DAILY WITH BREAKFAST 04/20/20   Koberlein, Junell C, MD  vitamin C (ASCORBIC ACID) 250 MG  tablet Take 500 mg by mouth daily.    [provider]    Allergies    Aspirin, Codeine, Naproxen, and Oxycodone-acetaminophen  Review of Systems   Review of Systems  Constitutional:  Positive for appetite change.  Neurological:  Positive for weakness.  All other systems reviewed and are negative.  Physical Exam Updated Vital Signs BP 118/68   Pulse 63   Temp 98.4 F (36.9 C) (Oral)   Resp 19   Ht 5' 0.25" (1.53 m)   Wt 46.4 kg   SpO2 95%   BMI 19.81 kg/m   Physical Exam Vitals and nursing note reviewed.  Constitutional:      Appearance: Normal appearance.  HENT:     Head: Normocephalic and atraumatic.     Right Ear: External ear normal.     Left Ear: External ear normal.     Nose: Nose normal.     Mouth/Throat:     Mouth: Mucous membranes are dry.  Eyes:     Extraocular Movements: Extraocular movements intact.     Conjunctiva/sclera: Conjunctivae normal.     Pupils: Pupils are equal, round, and reactive to light.  Cardiovascular:     Rate and Rhythm: Normal rate and regular rhythm.     Pulses: Normal pulses.     Heart sounds: Normal heart sounds.  Pulmonary:     Effort: Pulmonary effort is normal.     Breath sounds: Normal breath sounds.  Abdominal:     General: Abdomen is flat. Bowel sounds are normal.     Palpations: Abdomen is soft.  Musculoskeletal:        General: Normal range of motion.     Cervical back: Normal range of motion and neck supple.  Skin:    General: Skin is dry.     Capillary Refill: Capillary refill takes less than 2 seconds.     Comments: Chronic wound RLE improving  Neurological:     General: No focal deficit present.     Mental Status: She is alert and oriented to person, place, and time.  Psychiatric:        Mood and Affect: Mood normal.        Behavior: Behavior normal.        Thought Content: Thought content normal.        Judgment: Judgment normal.    ED Results / Procedures / Treatments   Labs (all labs ordered  are listed, but only abnormal results are displayed) Labs Reviewed  COMPREHENSIVE METABOLIC PANEL - Abnormal;  Notable for the following components:      Result Value   Sodium 132 (*)    Chloride 95 (*)    Creatinine, Ser 1.55 (*)    AST 93 (*)    GFR, Estimated 32 (*)    All other components within normal limits  RESP PANEL BY RT-PCR (FLU A&B, COVID) ARPGX2  LACTIC ACID, PLASMA  LACTIC ACID, PLASMA  CBC WITH DIFFERENTIAL/PLATELET  URINALYSIS, ROUTINE W REFLEX MICROSCOPIC  URINALYSIS, COMPLETE (UACMP) WITH MICROSCOPIC    EKG EKG Interpretation  Date/Time:  Tuesday August 30 2020 20:17:20 EDT Ventricular Rate:  61 PR Interval:  161 QRS Duration: 86 QT Interval:  408 QTC Calculation: 411 R Axis:   82 Text Interpretation: Sinus rhythm Atrial premature complexes Borderline right axis deviation Minimal ST depression, diffuse leads now in nsr Confirmed by Jacalyn Lefevre [phone]) on 08/30/2020 8:48:35 PM  Radiology CT HEAD WO CONTRAST  Result Date: 08/30/2020 CLINICAL DATA:  Altered mental status with dizziness, initial encounter EXAM: CT HEAD WITHOUT CONTRAST TECHNIQUE: Contiguous axial images were obtained from the base of the skull through the vertex without intravenous contrast. COMPARISON:  None. FINDINGS: Brain: No evidence of acute infarction, hemorrhage, hydrocephalus, extra-axial collection or mass lesion/mass effect. Chronic atrophic and ischemic changes are noted. Vascular: No hyperdense vessel or unexpected calcification. Skull: Normal. Negative for fracture or focal lesion. Sinuses/Orbits: No acute finding. Other: None. IMPRESSION: Chronic atrophic and ischemic changes without acute abnormality. Electronically Signed   By: Alcide Clever M.D.   On: 08/30/2020 21:07   DG Chest Port 1 View  Result Date: 08/30/2020 CLINICAL DATA:  Altered mental status, weakness EXAM: PORTABLE CHEST 1 VIEW COMPARISON:  01/27/2018 FINDINGS: Cardiac shadow is stable. Aortic calcifications are again  seen. The lungs are well aerated bilaterally. No focal infiltrate or effusion is seen. No bony abnormality is noted. IMPRESSION: No active disease. Electronically Signed   By: Alcide Clever M.D.   On: 08/30/2020 21:05    Procedures Procedures   Medications Ordered in ED Medications  0.9 %  sodium chloride infusion ( Intravenous New Bag/Given 08/30/20 2039)    ED Course  I have reviewed the triage vital signs and the nursing notes.  Pertinent labs & imaging results that were available during my care of the patient were reviewed by me and considered in my medical decision making (see chart for details).    MDM Rules/Calculators/A&P                           Pt is given IVFs and is looking better.  She is tolerating po fluids.  Urine pending at shift change.  Plan is to ambulate patient and see how she does.  I am going to have her stop the Bactrim.    Pt signed out to Dr. Dalene Seltzer at shift change. Final Clinical Impression(s) / ED Diagnoses Final diagnoses:  Dehydration  Weakness    Rx / DC Orders ED Discharge Orders     None        Jacalyn Lefevre, MD 08/30/20 2303

## 2020-08-30 NOTE — Discharge Instructions (Signed)
Stop the Bactrim DS.  Just use the mupirocin ointment.

## 2020-08-31 ENCOUNTER — Inpatient Hospital Stay (HOSPITAL_COMMUNITY): Payer: Medicare Other

## 2020-08-31 ENCOUNTER — Observation Stay (HOSPITAL_COMMUNITY): Payer: Medicare Other

## 2020-08-31 ENCOUNTER — Emergency Department (HOSPITAL_BASED_OUTPATIENT_CLINIC_OR_DEPARTMENT_OTHER): Payer: Medicare Other

## 2020-08-31 ENCOUNTER — Encounter (HOSPITAL_COMMUNITY): Payer: Self-pay | Admitting: Family Medicine

## 2020-08-31 DIAGNOSIS — N179 Acute kidney failure, unspecified: Secondary | ICD-10-CM | POA: Diagnosis present

## 2020-08-31 DIAGNOSIS — D510 Vitamin B12 deficiency anemia due to intrinsic factor deficiency: Secondary | ICD-10-CM | POA: Diagnosis present

## 2020-08-31 DIAGNOSIS — Z7983 Long term (current) use of bisphosphonates: Secondary | ICD-10-CM | POA: Diagnosis not present

## 2020-08-31 DIAGNOSIS — R262 Difficulty in walking, not elsewhere classified: Secondary | ICD-10-CM | POA: Diagnosis present

## 2020-08-31 DIAGNOSIS — I10 Essential (primary) hypertension: Secondary | ICD-10-CM | POA: Diagnosis present

## 2020-08-31 DIAGNOSIS — K8050 Calculus of bile duct without cholangitis or cholecystitis without obstruction: Secondary | ICD-10-CM | POA: Diagnosis not present

## 2020-08-31 DIAGNOSIS — R609 Edema, unspecified: Secondary | ICD-10-CM | POA: Diagnosis not present

## 2020-08-31 DIAGNOSIS — Z885 Allergy status to narcotic agent status: Secondary | ICD-10-CM | POA: Diagnosis not present

## 2020-08-31 DIAGNOSIS — R531 Weakness: Secondary | ICD-10-CM

## 2020-08-31 DIAGNOSIS — M109 Gout, unspecified: Secondary | ICD-10-CM | POA: Diagnosis present

## 2020-08-31 DIAGNOSIS — E440 Moderate protein-calorie malnutrition: Secondary | ICD-10-CM | POA: Diagnosis present

## 2020-08-31 DIAGNOSIS — Z886 Allergy status to analgesic agent status: Secondary | ICD-10-CM | POA: Diagnosis not present

## 2020-08-31 DIAGNOSIS — Z818 Family history of other mental and behavioral disorders: Secondary | ICD-10-CM | POA: Diagnosis not present

## 2020-08-31 DIAGNOSIS — Z681 Body mass index (BMI) 19 or less, adult: Secondary | ICD-10-CM | POA: Diagnosis not present

## 2020-08-31 DIAGNOSIS — I70212 Atherosclerosis of native arteries of extremities with intermittent claudication, left leg: Secondary | ICD-10-CM

## 2020-08-31 DIAGNOSIS — M810 Age-related osteoporosis without current pathological fracture: Secondary | ICD-10-CM | POA: Diagnosis present

## 2020-08-31 DIAGNOSIS — I639 Cerebral infarction, unspecified: Secondary | ICD-10-CM | POA: Diagnosis not present

## 2020-08-31 DIAGNOSIS — Z820 Family history of epilepsy and other diseases of the nervous system: Secondary | ICD-10-CM | POA: Diagnosis not present

## 2020-08-31 DIAGNOSIS — R945 Abnormal results of liver function studies: Secondary | ICD-10-CM | POA: Diagnosis not present

## 2020-08-31 DIAGNOSIS — Z79899 Other long term (current) drug therapy: Secondary | ICD-10-CM | POA: Diagnosis not present

## 2020-08-31 DIAGNOSIS — Z7952 Long term (current) use of systemic steroids: Secondary | ICD-10-CM | POA: Diagnosis not present

## 2020-08-31 DIAGNOSIS — M1711 Unilateral primary osteoarthritis, right knee: Secondary | ICD-10-CM | POA: Diagnosis not present

## 2020-08-31 DIAGNOSIS — G9341 Metabolic encephalopathy: Secondary | ICD-10-CM | POA: Diagnosis present

## 2020-08-31 DIAGNOSIS — K8020 Calculus of gallbladder without cholecystitis without obstruction: Secondary | ICD-10-CM | POA: Diagnosis not present

## 2020-08-31 DIAGNOSIS — Z20822 Contact with and (suspected) exposure to covid-19: Secondary | ICD-10-CM | POA: Diagnosis present

## 2020-08-31 DIAGNOSIS — L0390 Cellulitis, unspecified: Secondary | ICD-10-CM

## 2020-08-31 DIAGNOSIS — S81801A Unspecified open wound, right lower leg, initial encounter: Secondary | ICD-10-CM | POA: Diagnosis present

## 2020-08-31 DIAGNOSIS — E785 Hyperlipidemia, unspecified: Secondary | ICD-10-CM | POA: Diagnosis present

## 2020-08-31 DIAGNOSIS — E860 Dehydration: Secondary | ICD-10-CM | POA: Diagnosis present

## 2020-08-31 DIAGNOSIS — M353 Polymyalgia rheumatica: Secondary | ICD-10-CM | POA: Diagnosis present

## 2020-08-31 DIAGNOSIS — Z8249 Family history of ischemic heart disease and other diseases of the circulatory system: Secondary | ICD-10-CM | POA: Diagnosis not present

## 2020-08-31 LAB — CBC WITH DIFFERENTIAL/PLATELET
Abs Immature Granulocytes: 0.03 10*3/uL (ref 0.00–0.07)
Basophils Absolute: 0 10*3/uL (ref 0.0–0.1)
Basophils Relative: 0 %
Eosinophils Absolute: 0 10*3/uL (ref 0.0–0.5)
Eosinophils Relative: 1 %
HCT: 37 % (ref 36.0–46.0)
Hemoglobin: 12.7 g/dL (ref 12.0–15.0)
Immature Granulocytes: 1 %
Lymphocytes Relative: 16 %
Lymphs Abs: 1 10*3/uL (ref 0.7–4.0)
MCH: 33.3 pg (ref 26.0–34.0)
MCHC: 34.3 g/dL (ref 30.0–36.0)
MCV: 97.1 fL (ref 80.0–100.0)
Monocytes Absolute: 0.6 10*3/uL (ref 0.1–1.0)
Monocytes Relative: 9 %
Neutro Abs: 4.8 10*3/uL (ref 1.7–7.7)
Neutrophils Relative %: 73 %
Platelets: 209 10*3/uL (ref 150–400)
RBC: 3.81 MIL/uL — ABNORMAL LOW (ref 3.87–5.11)
RDW: 13.2 % (ref 11.5–15.5)
WBC: 6.4 10*3/uL (ref 4.0–10.5)
nRBC: 0 % (ref 0.0–0.2)

## 2020-08-31 LAB — HEPATIC FUNCTION PANEL
ALT: 27 U/L (ref 0–44)
AST: 84 U/L — ABNORMAL HIGH (ref 15–41)
Albumin: 3.7 g/dL (ref 3.5–5.0)
Alkaline Phosphatase: 47 U/L (ref 38–126)
Bilirubin, Direct: 0.1 mg/dL (ref 0.0–0.2)
Indirect Bilirubin: 0.3 mg/dL (ref 0.3–0.9)
Total Bilirubin: 0.4 mg/dL (ref 0.3–1.2)
Total Protein: 6.2 g/dL — ABNORMAL LOW (ref 6.5–8.1)

## 2020-08-31 LAB — TSH: TSH: 1.564 u[IU]/mL (ref 0.350–4.500)

## 2020-08-31 LAB — BASIC METABOLIC PANEL
Anion gap: 9 (ref 5–15)
BUN: 22 mg/dL (ref 8–23)
CO2: 21 mmol/L — ABNORMAL LOW (ref 22–32)
Calcium: 8.8 mg/dL — ABNORMAL LOW (ref 8.9–10.3)
Chloride: 105 mmol/L (ref 98–111)
Creatinine, Ser: 1.35 mg/dL — ABNORMAL HIGH (ref 0.44–1.00)
GFR, Estimated: 37 mL/min — ABNORMAL LOW (ref >60)
Glucose, Bld: 71 mg/dL (ref 70–99)
Potassium: 4.6 mmol/L (ref 3.5–5.1)
Sodium: 135 mmol/L (ref 135–145)

## 2020-08-31 LAB — HEPATITIS PANEL, ACUTE
HCV Ab: NONREACTIVE
Hep A IgM: NONREACTIVE
Hep B C IgM: NONREACTIVE
Hepatitis B Surface Ag: NONREACTIVE

## 2020-08-31 LAB — AMMONIA: Ammonia: 16 umol/L (ref 9–35)

## 2020-08-31 LAB — SEDIMENTATION RATE: Sed Rate: 10 mm/hr (ref 0–22)

## 2020-08-31 MED ORDER — THIAMINE HCL 100 MG PO TABS
100.0000 mg | ORAL_TABLET | Freq: Every day | ORAL | Status: DC
  Administered 2020-08-31 – 2020-09-02 (×3): 100 mg via ORAL
  Filled 2020-08-31 (×3): qty 1

## 2020-08-31 MED ORDER — ACETAMINOPHEN 325 MG PO TABS
650.0000 mg | ORAL_TABLET | Freq: Four times a day (QID) | ORAL | Status: DC | PRN
  Administered 2020-09-01: 650 mg via ORAL
  Filled 2020-08-31: qty 2

## 2020-08-31 MED ORDER — ASCORBIC ACID 500 MG PO TABS
500.0000 mg | ORAL_TABLET | Freq: Every day | ORAL | Status: DC
  Administered 2020-08-31 – 2020-09-02 (×3): 500 mg via ORAL
  Filled 2020-08-31 (×3): qty 1

## 2020-08-31 MED ORDER — ADULT MULTIVITAMIN W/MINERALS CH
1.0000 | ORAL_TABLET | Freq: Every day | ORAL | Status: DC
  Administered 2020-08-31 – 2020-09-02 (×3): 1 via ORAL
  Filled 2020-08-31 (×3): qty 1

## 2020-08-31 MED ORDER — ACETAMINOPHEN 650 MG RE SUPP: 650.0000 mg | Freq: Four times a day (QID) | RECTAL | Status: DC | PRN

## 2020-08-31 MED ORDER — SODIUM CHLORIDE 0.9 % IV BOLUS
1000.0000 mL | Freq: Once | INTRAVENOUS | Status: AC
  Administered 2020-08-31: 1000 mL via INTRAVENOUS

## 2020-08-31 MED ORDER — ENSURE ENLIVE PO LIQD
237.0000 mL | Freq: Two times a day (BID) | ORAL | Status: DC
  Administered 2020-08-31 – 2020-09-01 (×3): 237 mL via ORAL

## 2020-08-31 MED ORDER — VITAMIN B-12 1000 MCG PO TABS
1000.0000 ug | ORAL_TABLET | Freq: Every day | ORAL | Status: DC
  Administered 2020-08-31 – 2020-09-02 (×3): 1000 ug via ORAL
  Filled 2020-08-31 (×3): qty 1

## 2020-08-31 MED ORDER — SODIUM CHLORIDE 0.9 % IV SOLN: INTRAVENOUS | Status: AC

## 2020-08-31 MED ORDER — DIPHENHYDRAMINE HCL 25 MG PO CAPS
25.0000 mg | ORAL_CAPSULE | Freq: Every evening | ORAL | Status: DC | PRN
  Administered 2020-09-01: 25 mg via ORAL
  Filled 2020-08-31: qty 1

## 2020-08-31 MED ORDER — SODIUM CHLORIDE 0.9 % IV SOLN
100.0000 mg | Freq: Two times a day (BID) | INTRAVENOUS | Status: DC
  Administered 2020-08-31: 100 mg via INTRAVENOUS
  Filled 2020-08-31 (×2): qty 100

## 2020-08-31 MED ORDER — ENOXAPARIN SODIUM 30 MG/0.3ML IJ SOSY
30.0000 mg | PREFILLED_SYRINGE | INTRAMUSCULAR | Status: DC
  Administered 2020-08-31 – 2020-09-02 (×3): 30 mg via SUBCUTANEOUS
  Filled 2020-08-31 (×3): qty 0.3

## 2020-08-31 MED ORDER — DOXYCYCLINE HYCLATE 100 MG PO TABS
100.0000 mg | ORAL_TABLET | Freq: Two times a day (BID) | ORAL | Status: DC
  Administered 2020-08-31 – 2020-09-01 (×2): 100 mg via ORAL
  Filled 2020-08-31 (×2): qty 1

## 2020-08-31 MED ORDER — CALCIUM CARBONATE 1250 (500 CA) MG PO TABS
1000.0000 mg | ORAL_TABLET | Freq: Every day | ORAL | Status: DC
  Administered 2020-08-31 – 2020-09-02 (×3): 1000 mg via ORAL
  Filled 2020-08-31 (×4): qty 1

## 2020-08-31 MED ORDER — VITAMIN B-12 1000 MCG SL SUBL: 1000.0000 ug | SUBLINGUAL_TABLET | Freq: Every day | SUBLINGUAL | Status: DC

## 2020-08-31 MED ORDER — PREDNISONE 5 MG PO TABS
2.5000 mg | ORAL_TABLET | Freq: Every day | ORAL | Status: DC
  Administered 2020-08-31 – 2020-09-02 (×3): 2.5 mg via ORAL
  Filled 2020-08-31 (×3): qty 1

## 2020-08-31 NOTE — Progress Notes (Signed)
Initial Nutrition Assessment  DOCUMENTATION CODES:   Non-severe (moderate) malnutrition in context of chronic illness  INTERVENTION:  - will order Ensure Plus BID, each supplement provides 350 kcal and 13 grams of protein. - will order Magic Cup with lunch meals, each supplement provides 290 kcal and 9 grams of protein. - will order 1 tablet multivitamin with minerals/day. - liberalized diet from Heart Healthy to Regular for patient who meets criteria for malnutrition.    NUTRITION DIAGNOSIS:   Moderate Malnutrition related to chronic illness (emphysema) as evidenced by moderate fat depletion, moderate muscle depletion.  GOAL:   Patient will meet greater than or equal to 90% of their needs  MONITOR:   PO intake, Supplement acceptance, Labs, Weight trends  REASON FOR ASSESSMENT:   Malnutrition Screening Tool  ASSESSMENT:   87 y.o. female with medical history of polymyalgia rheumatica on prednisone daily, pernicious anemia, recently found to have R leg wound and has been on abx for the past 3-4 weeks, HTN, HLD, vitamin D deficiency, osteoporosis, emphysema, gout, and alcohol abuse. She presented to the ED due to weakness, unsteadiness when ambulating, and increasing confusion.  Patient had MRI x2 this AM and did not have breakfast d/t this. Ordered lunch tray per patient request.  She is very hard of hearing and often mis-hears what is said and requires statements to be repeated 2-3 times and/or re-worded.   Patient lives alone, she still drives short distances, and she does all of her ADLs on her own. She cooks when she feels up to it but sometimes does not. Her daughter mainly grocery shops for her, or she will order groceries and then pick them up curbside. She typically ambulates with a walker.   Weight yesterday was 102 lb and weight on 12/07/19 was 106 lb. This indicates 4 lb weight loss (3.8% body weight) in the past 9 months; not significant for time frame.   Patient is  Observation status.    Labs reviewed; creatinine: 1.35 mg/dl, GFR: 37 mg/dl. Medications reviewed; 500 mg ascorbic acid/day, 1 tablet os-cal/day, 2.5 mg deltasone/day, 100 mg thiamine/day. 1000 mcg oral cyanocobalamin/day.  IVF; NS @ 50 ml/hr.    NUTRITION - FOCUSED PHYSICAL EXAM:  Flowsheet Row Most Recent Value  Orbital Region Moderate depletion  Upper Arm Region Moderate depletion  Thoracic and Lumbar Region Unable to assess  Buccal Region Moderate depletion  Temple Region Moderate depletion  Clavicle Bone Region Moderate depletion  Clavicle and Acromion Bone Region Moderate depletion  Scapular Bone Region Unable to assess  Dorsal Hand Mild depletion  Patellar Region Unable to assess  Anterior Thigh Region Unable to assess  Posterior Calf Region Unable to assess  Edema (RD Assessment) Unable to assess  Hair Reviewed  Eyes Reviewed  Mouth Reviewed  Skin Reviewed  Nails Reviewed       Diet Order:   Diet Order             Diet regular Room service appropriate? Yes; Fluid consistency: Thin  Diet effective now                   EDUCATION NEEDS:   No education needs have been identified at this time  Skin:  Skin Assessment: Skin Integrity Issues: Skin Integrity Issues:: Other (Comment) Other: non-pressure injury to R distal pre-tibia  Last BM:  8/9  Height:   Ht Readings from Last 1 Encounters:  08/30/20 5' 0.25" (1.53 m)    Weight:   Wt Readings from Last  1 Encounters:  08/30/20 46.4 kg     Estimated Nutritional Needs:  Kcal:  1200-1400 kcal Protein:  60-70 grams Fluid:  >/= 1.6 L/day     Trenton Gammon, MS, RD, LDN, CNSC Inpatient Clinical Dietitian RD pager # available in [facility]  After hours/weekend pager # available in [facility]

## 2020-08-31 NOTE — Evaluation (Signed)
Physical Therapy Evaluation Patient Details Name: [name] MRN: [id number] DOB: Sep 11, 1928 Today's Date: 08/31/2020   History of Present Illness  87 y.o. female  recently found to have wound of the right leg for which patient is on antibiotic for the last 3 to 4 weeks and has been on antibiotic now was found to be increasingly confused weak over the last 2 days. Pt with history of polymyalgia rheumatica on prednisone 2.5 mg daily, pernicious anemia,  Clinical Impression  Pt admitted with above diagnosis. Pt ambulated 60' with RW with min A to maneuver RW. Pt's family is unable to provide 24/7 assist. Pt is a high fall risk and is confused, so is not safe to DC home alone at present. Pt currently with functional limitations due to the deficits listed below (see PT Problem List). Pt will benefit from skilled PT to increase their independence and safety with mobility to allow discharge to the venue listed below.       Follow Up Recommendations SNF;Supervision/Assistance - 24 hour;Supervision for mobility/OOB    Equipment Recommendations  None recommended by PT    Recommendations for Other Services       Precautions / Restrictions Precautions Precautions: Fall Precaution Comments: daughter provided PLOF, she stated pt required assist to prevent falls x 2 in last couple days Restrictions Weight Bearing Restrictions: No      Mobility  Bed Mobility Overal bed mobility: Needs Assistance Bed Mobility: Supine to Sit     Supine to sit: Mod assist     General bed mobility comments: assist to raise trunk    Transfers Overall transfer level: Needs assistance Equipment used: Rolling walker (2 wheeled) Transfers: Sit to/from Stand Sit to Stand: Mod assist         General transfer comment: assist to rise  Ambulation/Gait Ambulation/Gait assistance: Min assist Gait Distance (Feet): 70 Feet Assistive device: Rolling walker (2 wheeled) Gait Pattern/deviations:  Step-through pattern;Decreased stride length;Drifts right/left Gait velocity: WFL   General Gait Details: min A to maneuver RW as pt tends to drift R and run into the wall  Stairs            Wheelchair Mobility    Modified Rankin (Stroke Patients Only)       Balance Overall balance assessment: Needs assistance Sitting-balance support: Feet supported;No upper extremity supported Sitting balance-Leahy Scale: Good     Standing balance support: Bilateral upper extremity supported Standing balance-Leahy Scale: Poor Standing balance comment: relies on BUE support                             Pertinent Vitals/Pain Pain Assessment: No/denies pain    Home Living Family/patient expects to be discharged to:: Private residence Living Arrangements: Alone Available Help at Discharge: Available PRN/intermittently;Family; daughter cares for grandchild and has a bad back, cannot provide 24/7 assist.  Type of Home: House Home Access: Stairs to enter Entrance Stairs-Rails: Doctor, general practice of Steps: 3 Home Layout: One level Home Equipment: Walker - 2 wheels;Shower seat;Cane - single point      Prior Function Level of Independence: Independent with assistive device(s)         Comments: walked with SPC at baseline     Hand Dominance        Extremity/Trunk Assessment   Upper Extremity Assessment Upper Extremity Assessment: Overall WFL for tasks assessed    Lower Extremity Assessment Lower Extremity Assessment: Overall WFL for tasks assessed  Cervical / Trunk Assessment Cervical / Trunk Assessment: Normal  Communication   Communication: HOH  Cognition Arousal/Alertness: Awake/alert Behavior During Therapy: WFL for tasks assessed/performed Overall Cognitive Status: Impaired/Different from baseline Area of Impairment: Memory;Safety/judgement                     Memory: Decreased short-term memory   Safety/Judgement:  Decreased awareness of deficits;Decreased awareness of safety            General Comments      Exercises     Assessment/Plan    PT Assessment Patient needs continued PT services  PT Problem List Decreased activity tolerance;Decreased balance;Decreased strength;Decreased mobility;Decreased knowledge of use of DME       PT Treatment Interventions Gait training;Therapeutic activities;Therapeutic exercise;Functional mobility training;Balance training;Patient/family education    PT Goals (Current goals can be found in the Care Plan section)  Acute Rehab PT Goals Patient Stated Goal: return to living independently PT Goal Formulation: With patient/family Time For Goal Achievement: 09/14/20 Potential to Achieve Goals: Good    Frequency Min 3X/week   Barriers to discharge        Co-evaluation               AM-PAC PT "6 Clicks" Mobility  Outcome Measure Help needed turning from your back to your side while in a flat bed without using bedrails?: A Little Help needed moving from lying on your back to sitting on the side of a flat bed without using bedrails?: A Lot Help needed moving to and from a bed to a chair (including a wheelchair)?: A Little Help needed standing up from a chair using your arms (e.g., wheelchair or bedside chair)?: A Lot Help needed to walk in hospital room?: A Little Help needed climbing 3-5 steps with a railing? : A Lot 6 Click Score: 15    End of Session Equipment Utilized During Treatment: Gait belt Activity Tolerance: Patient tolerated treatment well Patient left: in chair;with call bell/phone within reach;with chair alarm set;with family/visitor present Nurse Communication: Mobility status PT Visit Diagnosis: Difficulty in walking, not elsewhere classified (R26.2);Other abnormalities of gait and mobility (R26.89)    Time: 1250-1319 PT Time Calculation (min) (ACUTE ONLY): 29 min   Charges:   PT Evaluation $PT Eval Moderate Complexity: 1  Mod PT Treatments $Gait Training: 8-22 mins       Ralene Bathe Kistler PT 08/31/2020  Acute Rehabilitation Services Pager [phone] Office [phone]

## 2020-08-31 NOTE — Progress Notes (Signed)
ABI's have been completed. Preliminary results can be found in CV Proc through chart review.   08/31/20 2:26 PM Olen Cordial RVT

## 2020-08-31 NOTE — Plan of Care (Signed)
  Problem: Activity: Goal: Risk for activity intolerance will decrease Outcome: Progressing   Problem: Nutrition: Goal: Adequate nutrition will be maintained Outcome: Progressing   Problem: Safety: Goal: Ability to remain free from injury will improve Outcome: Progressing   Problem: Skin Integrity: Goal: Risk for impaired skin integrity will decrease Outcome: Progressing   

## 2020-08-31 NOTE — Progress Notes (Signed)
[facility] PROGRESS NOTE    Progress Note  [name]  WUJ:[id number] DOB: [birth year] DOA: 08/30/2020 PCP: Wynn BankerKoberlein, Junell C, MD     Brief Narrative:   [name] LevansRebecca T [name] is an 87 y.o. female past medical history of polymyalgia rheumatica on prednisone, pernicious anemia recently found to have right leg wound for which she was on antibiotics 3 weeks, was found recently confused over the last 2 days by daughter.  CT scan of the head was unremarkable, with a rise in her creatinine from 0.9->1.5 compared to 10 days ago, AST mildly elevated at 93   Assessment/Plan:   AKI (acute kidney injury) (HCC) With a sed rate of 10 which makes acute interstitial nephritis unlikely, specific gravity 1030 and a rise of her creatinine from 0.9-1.5 likely prerenal azotemia. Started on IV fluids is slowly improving. Will continue IV fluids for an additional 24 hours.  Recheck basic metabolic panel in the morning.  Generalized weakness and difficulty ambulating: Sed rate of 10 Physical therapy has been consulted. MRI of the brain showed no acute findings.  Acute metabolic encephalopathy: Likely multifactorial in the setting of infectious etiology and acute kidney injury now resolved.  Right leg wound which is being ongoing for 3 to 4 weeks MRI of the tibia shows soft shallow tissue defect minimal subcutaneous edema negative for acute osteomyelitis and right tibiofemoral compartment osteoarthritis as well as a Baker's cyst. Continue doxycycline. Wound care has been consulted Check ABIs  Mild elevation in AST: Acute hepatitis panel negative Abdominal ultrasound multiple stones no common bile duct dilation or pericholecystic fluid.  Acute metabolic encephalopathy: Likely due to acute kidney injury and dehydration now resolved. B12 pending Ammonia level pending TSH 1.5  History of pernicious anemia: Continue B12 supplementation, B12 level are pending.  Elevated blood pressure  without a diagnosis of hypertension: On hydralazine IV as needed.  DVT prophylaxis: lovenox Family Communication: Daughter Status is: Observation  The patient will require care spanning > 2 midnights and should be moved to inpatient because: Hemodynamically unstable  Dispo: The patient is from: Home              Anticipated d/c is to: SNF              Patient currently is not medically stable to d/c.   Difficult to place patient No        Code Status:     Code Status Orders  (From admission, onward)           Start     Ordered   08/31/20 0603  Full code  Continuous        08/31/20 0603           Code Status History     Date Active Date Inactive Code Status Order ID Comments User Context   01/28/2018 0348 01/29/2018 2204 Full Code [id number]  Eduard ClosKakrakandy, Arshad N, MD ED   02/13/2017 1620 02/14/2017 1743 Full Code [id number]  Tia AlertJones, David S, MD Inpatient      Advance Directive Documentation    Flowsheet Row Most Recent Value  Type of Advance Directive Healthcare Power of Attorney, Living will  Pre-existing out of facility DNR order (yellow form or pink MOST form) --  "MOST" Form in Place? --         IV Access:   Peripheral IV   Procedures and diagnostic studies:   DG Tibia/Fibula Right  Result Date: 08/31/2020 CLINICAL DATA:  Right lower leg wound, initial encounter  EXAM: RIGHT TIBIA AND FIBULA - 2 VIEW COMPARISON:  08/29/2012 FINDINGS: Degenerative changes about the knee joint are seen. No acute fracture or dislocation is noted. Soft tissue ulceration is noted anteriorly along the distal tibial metaphysis. No underlying bony erosive changes are seen. IMPRESSION: Soft tissue wound consistent with the given clinical history. No acute bony abnormality is noted Electronically Signed   By: Alcide Clever M.D.   On: 08/31/2020 01:22   CT HEAD WO CONTRAST  Result Date: 08/30/2020 CLINICAL DATA:  Altered mental status with dizziness, initial encounter EXAM: CT HEAD  WITHOUT CONTRAST TECHNIQUE: Contiguous axial images were obtained from the base of the skull through the vertex without intravenous contrast. COMPARISON:  None. FINDINGS: Brain: No evidence of acute infarction, hemorrhage, hydrocephalus, extra-axial collection or mass lesion/mass effect. Chronic atrophic and ischemic changes are noted. Vascular: No hyperdense vessel or unexpected calcification. Skull: Normal. Negative for fracture or focal lesion. Sinuses/Orbits: No acute finding. Other: None. IMPRESSION: Chronic atrophic and ischemic changes without acute abnormality. Electronically Signed   By: Alcide Clever M.D.   On: 08/30/2020 21:07   MR BRAIN WO CONTRAST  Result Date: 08/31/2020 CLINICAL DATA:  Neuro deficit, eval for CVA EXAM: MRI HEAD WITHOUT CONTRAST TECHNIQUE: Multiplanar, multiecho pulse sequences of the brain and surrounding structures were obtained without intravenous contrast. COMPARISON:  No prior MRI, correlation is made with CT 08/30/2020 FINDINGS: Brain: No acute infarction, hemorrhage, hydrocephalus, extra-axial collection or mass lesion. T2 hyperintense foci in the periventricular white matter, likely the sequela of chronic small-vessel ischemic disease. Scattered lacunar infarcts. Vascular: Normal flow voids. Skull and upper cervical spine: Normal marrow signal. Sinuses/Orbits: Status post bilateral lens replacements. Mucosal thickening in the maxillary sinuses. Other: None. IMPRESSION: No acute intracranial process. Electronically Signed   By: Wiliam Ke MD   On: 08/31/2020 09:50   MR TIBIA FIBULA RIGHT WO CONTRAST  Result Date: 08/31/2020 CLINICAL DATA:  Osteomyelitis suspected, tib/fib, xray done. Chronic small callus area at distal right lower leg EXAM: MRI OF LOWER RIGHT EXTREMITY WITHOUT CONTRAST TECHNIQUE: Multiplanar, multisequence MR imaging of the right tibia and fibula was performed. No intravenous contrast was administered. COMPARISON:  X-ray 08/31/2020 FINDINGS:  Bones/Joint/Cartilage No acute fracture or malalignment. No bone marrow edema or periostitis. No bony erosion. Knee and ankle joints are grossly intact. There is tibiofemoral compartment osteoarthritis of the right knee, incompletely characterized at the edge of the field of view. Partially visualized popliteal fossa fluid collection, likely Baker's cyst. No tibiotalar or subtalar joint effusion. Ligaments Grossly intact. Muscles and Tendons Visualized tendinous structures appear intact without tear or tenosynovitis. Slight fatty infiltration of the lower leg musculature. No intramuscular edema or fluid collections. Soft tissues Shallow soft tissue defect at the anteromedial aspect of the lower leg at the level of the distal tibial metaphysis located just medial to the tibialis anterior tendon. Minimal subcutaneous edema. No underlying fluid collection. No deep fascial edema. IMPRESSION: 1. Shallow soft tissue defect at the anteromedial aspect of the lower leg at the level of the distal tibial metaphysis located just medial to the tibialis anterior tendon. Minimal subcutaneous edema. No underlying fluid collection. 2. Negative for acute osteomyelitis of the right tibia or fibula. 3. Right tibiofemoral compartment osteoarthritis with partially visualized popliteal fossa fluid collection, likely Baker's cyst. Electronically Signed   By: Duanne Guess D.O.   On: 08/31/2020 10:12   DG Chest Port 1 View  Result Date: 08/30/2020 CLINICAL DATA:  Altered mental status, weakness EXAM: PORTABLE CHEST 1  VIEW COMPARISON:  01/27/2018 FINDINGS: Cardiac shadow is stable. Aortic calcifications are again seen. The lungs are well aerated bilaterally. No focal infiltrate or effusion is seen. No bony abnormality is noted. IMPRESSION: No active disease. Electronically Signed   By: Alcide Clever M.D.   On: 08/30/2020 21:05   US Abdomen Limited RUQ (LIVER/GB)  Result Date: 08/31/2020 CLINICAL DATA:  87 year old female with  abnormal LFTs. EXAM: ULTRASOUND ABDOMEN LIMITED RIGHT UPPER QUADRANT COMPARISON:  None. FINDINGS: Gallbladder: Small echogenic gallstones, generally nonshadowing (image 11), individually estimated at 6 mm. No gallbladder wall thickening. No sonographic Murphy sign elicited. Common bile duct: Diameter: Up to 9 mm, mildly dilated. See image 5. No filling defect identified in the visible duct. Liver: No focal lesion identified. Within normal limits in parenchymal echogenicity. No intrahepatic biliary ductal dilatation is evident. Portal vein is patent on color Doppler imaging with normal direction of blood flow towards the liver. Other: Negative visible right kidney. IMPRESSION: 1. Multiple small gallstones and mildly dilated appearance of the common bile duct. Consider Choledocholithiasis, although no intrahepatic ductal dilatation is identified to confirm acute ductal obstruction. 2. Otherwise negative ultrasound appearance of the liver. Electronically Signed   By: Odessa Fleming M.D.   On: 08/31/2020 09:04     Medical Consultants:   None.   Subjective:    [name] no complaints she relates she feels better than yesterday is hungry and tolerating her diet  Objective:    Vitals:   08/31/20 0100 08/31/20 0200 08/31/20 0416 08/31/20 1043  BP:  (!) 173/69 (!) 175/70 (!) 146/56  Pulse:  79 (!) 57 61  Resp: 16 16 18 16   Temp:   (!) 97.4 F (36.3 C) 98.5 F (36.9 C)  TempSrc:      SpO2: 98% 98% 100% 98%  Weight:      Height:       SpO2: 98 %   Intake/Output Summary (Last 24 hours) at 08/31/2020 1220 Last data filed at 08/31/2020 0600 Gross per 24 hour  Intake 1390 ml  Output 250 ml  Net 1140 ml   Filed Weights   08/30/20 2001  Weight: 46.4 kg    Exam: General exam: In no acute distress. Respiratory system: Good air movement and clear to auscultation. Cardiovascular system: S1 & S2 heard, RRR. No JVD. Gastrointestinal system: Abdomen is nondistended, soft and nontender.   Extremities: No pedal edema. Skin: Ulcer 2 cm drying up Psychiatry: Judgement and insight appear normal. Mood & affect appropriate.    Data Reviewed:    Labs: Basic Metabolic Panel: Recent Labs  Lab 08/30/20 2027 08/31/20 0618  NA 132* 135  K 4.3 4.6  CL 95* 105  CO2 24 21*  GLUCOSE 84 71  BUN 23 22  CREATININE 1.55* 1.35*  CALCIUM 9.3 8.8*   GFR Estimated Creatinine Clearance: 20.2 mL/min (A) (by C-G formula based on SCr of 1.35 mg/dL (H)). Liver Function Tests: Recent Labs  Lab 08/30/20 2027 08/31/20 0618  AST 93* 84*  ALT 28 27  ALKPHOS 59 47  BILITOT 0.3 0.4  PROT 7.4 6.2*  ALBUMIN 4.2 3.7   No results for input(s): LIPASE, AMYLASE in the last 168 hours. Recent Labs  Lab 08/31/20 0618  AMMONIA 16   Coagulation profile No results for input(s): INR, PROTIME in the last 168 hours. COVID-19 Labs  No results for input(s): DDIMER, FERRITIN, LDH, CRP in the last 72 hours.  Lab Results  Component Value Date   SARSCOV2NAA NEGATIVE 08/30/2020  SARSCOV2NAA Not Detected 12/30/2018    CBC: Recent Labs  Lab 08/30/20 2027 08/31/20 0618  WBC 7.9 6.4  NEUTROABS 6.1 4.8  HGB 14.5 12.7  HCT 41.6 37.0  MCV 94.8 97.1  PLT 257 209   Cardiac Enzymes: No results for input(s): CKTOTAL, CKMB, CKMBINDEX, TROPONINI in the last 168 hours. BNP (last 3 results) No results for input(s): PROBNP in the last 8760 hours. CBG: No results for input(s): GLUCAP in the last 168 hours. D-Dimer: No results for input(s): DDIMER in the last 72 hours. Hgb A1c: No results for input(s): HGBA1C in the last 72 hours. Lipid Profile: No results for input(s): CHOL, HDL, LDLCALC, TRIG, CHOLHDL, LDLDIRECT in the last 72 hours. Thyroid function studies: Recent Labs    08/31/20 0618  TSH 1.564   Anemia work up: No results for input(s): VITAMINB12, FOLATE, FERRITIN, TIBC, IRON, RETICCTPCT in the last 72 hours. Sepsis Labs: Recent Labs  Lab 08/30/20 2027 08/30/20 2222  08/31/20 0618  WBC 7.9  --  6.4  LATICACIDVEN 1.8 0.9  --    Microbiology Recent Results (from the past 240 hour(s))  Resp Panel by RT-PCR (Flu A&B, Covid) Nasopharyngeal Swab     Status: None   Collection Time: 08/30/20  9:00 PM   Specimen: Nasopharyngeal Swab; Nasopharyngeal(NP) swabs in vial transport medium  Result Value Ref Range Status   SARS Coronavirus 2 by RT PCR NEGATIVE NEGATIVE Final    Comment: (NOTE) SARS-CoV-2 target nucleic acids are NOT DETECTED.  The SARS-CoV-2 RNA is generally detectable in upper respiratory specimens during the acute phase of infection. The lowest concentration of SARS-CoV-2 viral copies this assay can detect is 138 copies/mL. A negative result does not preclude SARS-Cov-2 infection and should not be used as the sole basis for treatment or other patient management decisions. A negative result may occur with  improper specimen collection/handling, submission of specimen other than nasopharyngeal swab, presence of viral mutation(s) within the areas targeted by this assay, and inadequate number of viral copies(<138 copies/mL). A negative result must be combined with clinical observations, patient history, and epidemiological information. The expected result is Negative.  Fact Sheet for Patients:  BloggerCourse.com  Fact Sheet for Healthcare Providers:  SeriousBroker.it  This test is no t yet approved or cleared by the [place] FDA and  has been authorized for detection and/or diagnosis of SARS-CoV-2 by FDA under an Emergency Use Authorization (EUA). This EUA will remain  in effect (meaning this test can be used) for the duration of the COVID-19 declaration under Section 564(b)(1) of the Act, 21 U.S.C.section 360bbb-3(b)(1), unless the authorization is terminated  or revoked sooner.       Influenza A by PCR NEGATIVE NEGATIVE Final   Influenza B by PCR NEGATIVE NEGATIVE Final     Comment: (NOTE) The Xpert Xpress SARS-CoV-2/FLU/RSV plus assay is intended as an aid in the diagnosis of influenza from Nasopharyngeal swab specimens and should not be used as a sole basis for treatment. Nasal washings and aspirates are unacceptable for Xpert Xpress SARS-CoV-2/FLU/RSV testing.  Fact Sheet for Patients: BloggerCourse.com  Fact Sheet for Healthcare Providers: SeriousBroker.it  This test is not yet approved or cleared by the [place] FDA and has been authorized for detection and/or diagnosis of SARS-CoV-2 by FDA under an Emergency Use Authorization (EUA). This EUA will remain in effect (meaning this test can be used) for the duration of the COVID-19 declaration under Section 564(b)(1) of the Act, 21 U.S.C. section 360bbb-3(b)(1), unless the authorization is  terminated or revoked.  Performed at [facility], [address] [place], [place] [address]      Medications:    vitamin C  500 mg Oral Daily   calcium carbonate  1,000 mg of elemental calcium Oral Q breakfast   enoxaparin (LOVENOX) injection  30 mg Subcutaneous Q24H   feeding supplement  237 mL Oral BID BM   multivitamin with minerals  1 tablet Oral Daily   predniSONE  2.5 mg Oral Q breakfast   thiamine  100 mg Oral Daily   vitamin B-12  1,000 mcg Oral Daily   Continuous Infusions:  sodium chloride 50 mL/hr at 08/31/20 1046   doxycycline (VIBRAMYCIN) IV 100 mg (08/31/20 1048)      LOS: 0 days   Marinda Elk  [facility]  08/31/2020, 12:20 PM

## 2020-08-31 NOTE — Consult Note (Signed)
WOC Nurse wound consult note Consultation was completed by review of records, images and assistance from the bedside nurse/clinical staff.   Reason for Consult:right leg wound Wound type: full thickness ulceration, noted to have been skin tear that occurred 2-3 weeks ago. She has had ongoing care in her primary care office.  Pain has been an issue. She is on chronic steroids for polymyalgia rheumatica. Admitted for dehydration; confusion; weakness Pressure Injury POA: NA Measurement: 1.5cm x 1.0cm x 0.1cm  Wound BMW:[id number]; pale pink Drainage (amount, consistency, odor) none Periwound:hyperkeratotic darkened skin   MRI negative for osteomyelitis  Dressing procedure/placement/frequency:  Silver hydrofiber moistened with sterile water, NO SALINE, cut to fit and placed on wound bed. Top with foam dressing. Change every other day.   Consider vascular consultation for non healing LE wound with the associated pain. Unclear if steroid use vs vascular issues to prevent wound healing.   Consider FU in wound care center of patient's choice.    Re consult if needed, will not follow at this time. Thanks  Jacub Waiters [employer], RN,CWOCN, CNS, CWON-AP [phone])

## 2020-08-31 NOTE — H&P (Addendum)
History and Physical    [name] LevansRebecca T Labrake XBJ:[id number] DOB: 1928-03-27 DOA: 08/30/2020  PCP: Wynn BankerKoberlein, Junell C, MD  Patient coming from: Home.  Chief Complaint: Weakness.  Confusion.  History obtained from patient's daughter Ms. Lendell CapriceSullivan, previous chart and patient.  HPI: [name] is a 87 y.o. female with history of polymyalgia rheumatica on prednisone 2.5 mg daily, pernicious anemia, recently found to have wound of the right leg for which patient is on antibiotic for the last 3 to 4 weeks and has been on the third antibiotic now was found to be increasingly confused weak over the last 2 days.  Patient's daughter states that patient usually walks with help of a cane but last 2 days she was very unsteady and unable to even walk to the bathroom.  Patient also appeared confused.  Did not have any headache nausea vomiting diarrhea chest pain or shortness of breath but did have poor appetite.  ED Course: In the ER patient was afebrile.  CT head was unremarkable.  Labs show creatinine has worsened from 0.9 about 10 days ago it is around 1.5 AST is mildly elevated at 93 it was normal about 10 days ago.  EKG shows normal sinus rhythm with APCs.  COVID test was negative.  Patient was given fluid and x-rays of the right leg does not show any involvement of the bone.  Admitted for further management of acute renal failure and weakness.  Review of Systems: As per HPI, rest all negative.   Past Medical History:  Diagnosis Date   Alcohol abuse    Emphysema of lung (HCC) 01/26/2009   pt. unsure of this reports as of 07/31/2012   Fracture of fifth metatarsal bone of right foot 08/29/2012   Gout 08/11/2012   Hyperlipemia 03/27/2007   Hypertension    Osteoporosis 07/12/2006   Pernicious anemia 10/25/2006   Polymyalgia rheumatica (HCC) 03/27/2007   sees Dr. Kellie Simmeringruslow in rheumatology   Vitamin D deficiency 07/12/2006    Past Surgical History:  Procedure Laterality Date   APPENDECTOMY  07/12/2006    LUMBAR LAMINECTOMY/DECOMPRESSION MICRODISCECTOMY Right 02/13/2017   Procedure: Microdiscectomy - right - Lumbar three-Lumbar four;  Surgeon: Tia AlertJones, David S, MD;  [facility];  Service: Neurosurgery;  Laterality: Right;   PARTIAL HIP ARTHROPLASTY     left side- 2x's ( 2nd surgery to correct 1st replacement), R side -    TOOTH EXTRACTION N/A 08/04/2012   Procedure: EXTRACTIONS 17, 20;  Surgeon: Georgia LopesScott M Jensen, DDS;  Location: [facility] OR;  Service: Oral Surgery;  Laterality: N/A;     reports that she quit smoking about 14 years ago. Her smoking use included cigarettes. She started smoking about 72 years ago. She has a 116.00 pack-year smoking history. She has never used smokeless tobacco. She reports that she does not drink alcohol and does not use drugs.  Allergies  Allergen Reactions   Naproxen Other (See Comments)    Severe pain in joints and hands   Oxycodone-Acetaminophen Other (See Comments)    REACTION: gastric reaction   Aspirin Other (See Comments)    "threw up blood"   Codeine Other (See Comments)    "threw up blood"    Family History  Problem Relation Age of Onset   Mental illness Mother        alzheimer dementia   Ulcers Father    Heart disease Father    Cancer Father        lung, smoker    Prior to  Admission medications   Medication Sig Start Date End Date Taking? Authorizing Provider  alendronate (FOSAMAX) 70 MG tablet TAKE 1 TABLET BY MOUTH  WEEKLY WITH 8 OZ OF PLAIN  WATER 30 MINUTES BEFORE  FIRST FOOD, DRINK OR MEDS.  STAY UPRIGHT FOR 30 MINS 07/11/20  Yes Koberlein, Junell C, MD  Cyanocobalamin (VITAMIN B-12) 1000 MCG SUBL Place 1,000 mcg under the tongue daily.    Yes [provider]  diphenhydrAMINE (BENADRYL) 25 MG tablet Take 25 mg by mouth at bedtime as needed for sleep.   Yes [provider]  furosemide (LASIX) 20 MG tablet Take 1 tablet (20 mg total) by mouth daily as needed. Patient taking differently: Take 20 mg by mouth daily as needed for  fluid. 09/26/18  Yes Koberlein, Paris Lore, MD  mupirocin ointment (BACTROBAN) 2 % Apply 1 application topically 2 (two) times daily. 08/26/20  Yes Koberlein, Paris Lore, MD  Oyster Shell (OYSTER CALCIUM) 500 MG TABS tablet Take 1,000 mg of elemental calcium by mouth daily.   Yes [provider]  predniSONE (DELTASONE) 2.5 MG tablet TAKE 1 TABLET BY MOUTH  DAILY WITH BREAKFAST 04/20/20  Yes Koberlein, Junell C, MD  vitamin C (ASCORBIC ACID) 250 MG tablet Take 500 mg by mouth daily.   Yes [provider]    Physical Exam: Constitutional: Moderately built and nourished. Vitals:   08/31/20 0000 08/31/20 0100 08/31/20 0200 08/31/20 0416  BP: (!) 149/60  (!) 173/69 (!) 175/70  Pulse:   79 (!) 57  Resp: (P) 16 16 16 18   Temp:    (!) 97.4 F (36.3 C)  TempSrc:      SpO2: 99% 98% 98% 100%  Weight:      Height:       Eyes: Anicteric no pallor. ENMT: No discharge from the ears eyes nose and mouth. Neck: No mass felt.  No neck rigidity. Respiratory: No rhonchi or crepitations. Cardiovascular: S1-S2 heard. Abdomen: Soft nontender bowel sounds present. Musculoskeletal: Wound of the right anterior shin of the right leg. Skin: Wound on the right leg anterior shin.  No active discharge. Neurologic: Alert awake oriented to name and place moving all extremities 5 x 5.  No facial asymmetry.  Tongue is midline. Psychiatric: Appears normal.  Normal affect.   Labs on Admission: I have personally reviewed following labs and imaging studies  CBC: Recent Labs  Lab 08/30/20 2027  WBC 7.9  NEUTROABS 6.1  HGB 14.5  HCT 41.6  MCV 94.8  PLT 257   Basic Metabolic Panel: Recent Labs  Lab 08/30/20 2027  NA 132*  K 4.3  CL 95*  CO2 24  GLUCOSE 84  BUN 23  CREATININE 1.55*  CALCIUM 9.3   GFR: Estimated Creatinine Clearance: 17.6 mL/min (A) (by C-G formula based on SCr of 1.55 mg/dL (H)). Liver Function Tests: Recent Labs  Lab 08/30/20 2027  AST 93*  ALT 28  ALKPHOS 59   BILITOT 0.3  PROT 7.4  ALBUMIN 4.2   No results for input(s): LIPASE, AMYLASE in the last 168 hours. No results for input(s): AMMONIA in the last 168 hours. Coagulation Profile: No results for input(s): INR, PROTIME in the last 168 hours. Cardiac Enzymes: No results for input(s): CKTOTAL, CKMB, CKMBINDEX, TROPONINI in the last 168 hours. BNP (last 3 results) No results for input(s): PROBNP in the last 8760 hours. HbA1C: No results for input(s): HGBA1C in the last 72 hours. CBG: No results for input(s): GLUCAP in the last 168 hours.  Lipid Profile: No results for input(s): CHOL, HDL, LDLCALC, TRIG, CHOLHDL, LDLDIRECT in the last 72 hours. Thyroid Function Tests: No results for input(s): TSH, T4TOTAL, FREET4, T3FREE, THYROIDAB in the last 72 hours. Anemia Panel: No results for input(s): VITAMINB12, FOLATE, FERRITIN, TIBC, IRON, RETICCTPCT in the last 72 hours. Urine analysis:    Component Value Date/Time   COLORURINE YELLOW 08/30/2020 2224   APPEARANCEUR CLEAR 08/30/2020 2224   LABSPEC >1.030 (H) 08/30/2020 2224   PHURINE 6.0 08/30/2020 2224   GLUCOSEU NEGATIVE 08/30/2020 2224   HGBUR NEGATIVE 08/30/2020 2224   HGBUR negative 06/01/2008 0943   BILIRUBINUR SMALL (A) 08/30/2020 2224   BILIRUBINUR n 10/02/2012 1029   KETONESUR 15 (A) 08/30/2020 2224   PROTEINUR NEGATIVE 08/30/2020 2224   UROBILINOGEN 0.2 10/02/2012 1029   UROBILINOGEN 0.2 07/01/2010 2207   NITRITE NEGATIVE 08/30/2020 2224   LEUKOCYTESUR NEGATIVE 08/30/2020 2224   Sepsis Labs: (procalcitonin:4,lacticidven:4) ) Recent Results (from the past 240 hour(s))  Resp Panel by RT-PCR (Flu A&B, Covid) Nasopharyngeal Swab     Status: None   Collection Time: 08/30/20  9:00 PM   Specimen: Nasopharyngeal Swab; Nasopharyngeal(NP) swabs in vial transport medium  Result Value Ref Range Status   SARS Coronavirus 2 by RT PCR NEGATIVE NEGATIVE Final    Comment: (NOTE) SARS-CoV-2 target nucleic acids are NOT  DETECTED.  The SARS-CoV-2 RNA is generally detectable in upper respiratory specimens during the acute phase of infection. The lowest concentration of SARS-CoV-2 viral copies this assay can detect is 138 copies/mL. A negative result does not preclude SARS-Cov-2 infection and should not be used as the sole basis for treatment or other patient management decisions. A negative result may occur with  improper specimen collection/handling, submission of specimen other than nasopharyngeal swab, presence of viral mutation(s) within the areas targeted by this assay, and inadequate number of viral copies(<138 copies/mL). A negative result must be combined with clinical observations, patient history, and epidemiological information. The expected result is Negative.  Fact Sheet for Patients:  BloggerCourse.com  Fact Sheet for Healthcare Providers:  SeriousBroker.it  This test is no t yet approved or cleared by the [place] FDA and  has been authorized for detection and/or diagnosis of SARS-CoV-2 by FDA under an Emergency Use Authorization (EUA). This EUA will remain  in effect (meaning this test can be used) for the duration of the COVID-19 declaration under Section 564(b)(1) of the Act, 21 U.S.C.section 360bbb-3(b)(1), unless the authorization is terminated  or revoked sooner.       Influenza A by PCR NEGATIVE NEGATIVE Final   Influenza B by PCR NEGATIVE NEGATIVE Final    Comment: (NOTE) The Xpert Xpress SARS-CoV-2/FLU/RSV plus assay is intended as an aid in the diagnosis of influenza from Nasopharyngeal swab specimens and should not be used as a sole basis for treatment. Nasal washings and aspirates are unacceptable for Xpert Xpress SARS-CoV-2/FLU/RSV testing.  Fact Sheet for Patients: BloggerCourse.com  Fact Sheet for Healthcare Providers: SeriousBroker.it  This test is not yet  approved or cleared by the [place] FDA and has been authorized for detection and/or diagnosis of SARS-CoV-2 by FDA under an Emergency Use Authorization (EUA). This EUA will remain in effect (meaning this test can be used) for the duration of the COVID-19 declaration under Section 564(b)(1) of the Act, 21 U.S.C. section 360bbb-3(b)(1), unless the authorization is terminated or revoked.  Performed at [facility], [address] [place], [place] [address]      Radiological Exams on  Admission: DG Tibia/Fibula Right  Result Date: 08/31/2020 CLINICAL DATA:  Right lower leg wound, initial encounter EXAM: RIGHT TIBIA AND FIBULA - 2 VIEW COMPARISON:  08/29/2012 FINDINGS: Degenerative changes about the knee joint are seen. No acute fracture or dislocation is noted. Soft tissue ulceration is noted anteriorly along the distal tibial metaphysis. No underlying bony erosive changes are seen. IMPRESSION: Soft tissue wound consistent with the given clinical history. No acute bony abnormality is noted Electronically Signed   By: Alcide Clever M.D.   On: 08/31/2020 01:22   CT HEAD WO CONTRAST  Result Date: 08/30/2020 CLINICAL DATA:  Altered mental status with dizziness, initial encounter EXAM: CT HEAD WITHOUT CONTRAST TECHNIQUE: Contiguous axial images were obtained from the base of the skull through the vertex without intravenous contrast. COMPARISON:  None. FINDINGS: Brain: No evidence of acute infarction, hemorrhage, hydrocephalus, extra-axial collection or mass lesion/mass effect. Chronic atrophic and ischemic changes are noted. Vascular: No hyperdense vessel or unexpected calcification. Skull: Normal. Negative for fracture or focal lesion. Sinuses/Orbits: No acute finding. Other: None. IMPRESSION: Chronic atrophic and ischemic changes without acute abnormality. Electronically Signed   By: Alcide Clever M.D.   On: 08/30/2020 21:07   DG Chest Port 1 View  Result Date: 08/30/2020 CLINICAL DATA:   Altered mental status, weakness EXAM: PORTABLE CHEST 1 VIEW COMPARISON:  01/27/2018 FINDINGS: Cardiac shadow is stable. Aortic calcifications are again seen. The lungs are well aerated bilaterally. No focal infiltrate or effusion is seen. No bony abnormality is noted. IMPRESSION: No active disease. Electronically Signed   By: Alcide Clever M.D.   On: 08/30/2020 21:05    EKG: Independently reviewed.  Normal sinus with APCs.  Assessment/Plan Principal Problem:   AKI (acute kidney injury) (HCC) Active Problems:   POLYMYALGIA RHEUMATICA   ARF (acute renal failure) (HCC)   Weakness    Acute renal failure could be from poor oral intake.  Patient also had recent use of Bactrim which could also increase creatinine but however since patient's sodium was low and urine does show ketones likely dehydrated.  Patient did receive fluids we will gently hydrate follow metabolic panel. Generalized weakness and difficulty to ambulate could be from dehydration.  However since patient has history of polymyalgia rheumatica we will check sed rate to make sure there is no exacerbation.  Get physical therapy consult get MRI brain since patient's presentation of acute.  Gently hydrate.  Check TSH. History of polymyalgia rheumatica on prednisone 2.5 mg daily which we will continue no.  Check sed rate to make sure there is no exacerbation. Right leg wound which has been ongoing for the last 3 to 4 weeks.  I have kept patient on Doxy and we will check MRI of the right leg.  Wound team consult. Mildly elevated AST when compared to recent.  We will recheck LFTs.  Check acute hepatitis panel.  Sonogram of the abdomen since patient has poor appetite.  Abdomen appears benign on exam. Brief episode of confusion which is improved at this time likely from dehydration.  I have ordered ammonia levels and B12 levels given history of pernicious anemia.  Check TSH. History of pernicious anemia on B12 supplements.  Checking B12  levels. Elevated blood pressure readings we will closely monitor blood pressure trends.  As needed IV hydralazine for systolic more than 160.   DVT prophylaxis: Lovenox. Code Status: Full code. Family Communication: Patient's daughter. Disposition Plan: To be determined. Consults called: Physical therapy. Admission status: Observation.   Meryle Ready  Toniann Fail MD [facility] Pager [phone].  If 7PM-7AM, please contact night-coverage [email or web address] Password [facility]  08/31/2020, 6:04 AM

## 2020-08-31 NOTE — TOC Initial Note (Signed)
Transition of Care [facility]) - Initial/Assessment Note    Patient Details  Name: [name] MRN: [id number] Date of Birth: 08-04-1928  [facility]) CM/SW Contact:    Lennart Pall, LCSW Phone Number: 08/31/2020, 3:58 PM  Clinical Narrative:                 Met with pt and daughter today to introduce TOC role and discuss PT recommendations for SNF rehab.  Pt very pleasant but fidgets throughout our conversation.  States she wants to go "check on my dog."  Not fully oriented at this point. Daughter reports that pt lives alone and was doing some "restricted driving" PTA.  She feels that pt has declined functionally over the past few weeks when daughter had to isolate due to [place].  She notes that she is not able to provide needed 24/7 assistance pt now requires but is tearful about need for SNF.  They are both agreed to this plan and we have discussed preferred facilities.  Will begin bed search and insurance authorization.  Expected Discharge Plan: Skilled Nursing Facility Barriers to Discharge: Continued Medical Work up, Ship broker   Patient Goals and CMS Choice Patient states their goals for this hospitalization and ongoing recovery are:: "to get back to my dog"      Expected Discharge Plan and Services Expected Discharge Plan: [place] In-house Referral: Clinical Social Work     Living arrangements for the past 2 months: Single Family Home                 DME Arranged: N/A DME Agency: NA                  Prior Living Arrangements/Services Living arrangements for the past 2 months: [place] Lives with:: Self Patient language and need for interpreter reviewed:: Yes Do you feel safe going back to the place where you live?: Yes      Need for Family Participation in Patient Care: Yes (Comment) Care giver support system in place?: No (comment)   Criminal Activity/Legal Involvement Pertinent to Current  Situation/Hospitalization: No - Comment as needed  Activities of Daily Living Home Assistive Devices/Equipment: Cane (specify quad or straight) ADL Screening (condition at time of admission) Patient's cognitive ability adequate to safely complete daily activities?: Yes Is the patient deaf or have difficulty hearing?: Yes Does the patient have difficulty seeing, even when wearing glasses/contacts?: No Does the patient have difficulty concentrating, remembering, or making decisions?: No Patient able to express need for assistance with ADLs?: Yes Does the patient have difficulty dressing or bathing?: Yes Independently performs ADLs?: Yes (appropriate for developmental age) Does the patient have difficulty walking or climbing stairs?: Yes Weakness of Legs: Both Weakness of Arms/Hands: None  Permission Sought/Granted Permission sought to share information with : Facility Sport and exercise psychologist, Family Supports Permission granted to share information with : Yes, Verbal Permission Granted  Share Information with NAME: DeeDee [place]ley [place]     Permission granted to share info w Relationship: daughter  Permission granted to share info w Contact Information: [phone]  Emotional Assessment Appearance:: Appears stated age Attitude/Demeanor/Rapport: Gracious Affect (typically observed): Accepting Orientation: : Oriented to Self, Oriented to Place Alcohol / Substance Use: Not Applicable Psych Involvement: No (comment)  Admission diagnosis:  Dehydration [E86.0] Weakness [R53.1] AKI (acute kidney injury) ([place]) [N17.9] ARF (acute renal failure) (HCC) [N17.9] Patient Active Problem List   Diagnosis Date Noted   AKI (acute kidney injury) ([place]) 08/31/2020  Weakness 08/31/2020   ARF (acute renal failure) ([place]) 01/28/2018   S/P lumbar laminectomy 02/13/2017   Hearing loss of both ears 06/04/2012   Vitamin D deficiency 06/30/2009   POLYMYALGIA RHEUMATICA 03/27/2007   Pernicious anemia  10/25/2006   Essential hypertension 07/12/2006   Osteoporosis 07/12/2006   PCP:  Caren Macadam, MD Pharmacy:   [facility] ([place]) [place], [place] [address] Phone: [phone] Fax: [phone]  [facility]  ([place], [place] [address] Phone: [phone] Fax: [place], [place] [address] Phone: [phone] Fax: [phone]     Social Determinants of Health (SDOH) Interventions    Readmission Risk Interventions No flowsheet data found.

## 2020-09-01 DIAGNOSIS — E440 Moderate protein-calorie malnutrition: Secondary | ICD-10-CM | POA: Insufficient documentation

## 2020-09-01 LAB — BASIC METABOLIC PANEL
Anion gap: 8 (ref 5–15)
BUN: 24 mg/dL — ABNORMAL HIGH (ref 8–23)
CO2: 22 mmol/L (ref 22–32)
Calcium: 9 mg/dL (ref 8.9–10.3)
Chloride: 105 mmol/L (ref 98–111)
Creatinine, Ser: 1.36 mg/dL — ABNORMAL HIGH (ref 0.44–1.00)
GFR, Estimated: 37 mL/min — ABNORMAL LOW (ref >60)
Glucose, Bld: 92 mg/dL (ref 70–99)
Potassium: 4.5 mmol/L (ref 3.5–5.1)
Sodium: 135 mmol/L (ref 135–145)

## 2020-09-01 MED ORDER — DOXYCYCLINE HYCLATE 100 MG PO TABS
100.0000 mg | ORAL_TABLET | Freq: Two times a day (BID) | ORAL | Status: DC
  Administered 2020-09-01 – 2020-09-02 (×3): 100 mg via ORAL
  Filled 2020-09-01 (×4): qty 1

## 2020-09-01 MED ORDER — POLYETHYLENE GLYCOL 3350 17 G PO PACK
17.0000 g | PACK | Freq: Two times a day (BID) | ORAL | Status: AC
  Administered 2020-09-01 (×2): 17 g via ORAL
  Filled 2020-09-01 (×2): qty 1

## 2020-09-01 MED ORDER — SODIUM CHLORIDE 0.9 % IV SOLN: INTRAVENOUS | Status: AC

## 2020-09-01 NOTE — NC FL2 (Signed)
[place] MEDICAID FL2 LEVEL OF CARE SCREENING TOOL     IDENTIFICATION  Patient Name: [name] Birthdate: Mar 18, 1928 Sex: female Admission Date (Current Location): 08/30/2020  [facility] and [place] Number:  Producer, television/film/video and Address:  [facility],  [address] [place], [place] [address]      Provider Number: [id number]  Attending Physician Name and Address:  Marinda Elk, MD  Relative Name and Phone Number:  daughter, Hosie Poisson @ [phone]    Current Level of Care: Hospital Recommended Level of Care: Skilled Nursing Facility Prior Approval Number:    Date Approved/Denied:   PASRR Number: [id number]  Discharge Plan: SNF    Current Diagnoses: Patient Active Problem List   Diagnosis Date Noted   Malnutrition of moderate degree 09/01/2020   AKI (acute kidney injury) (HCC) 08/31/2020   Weakness 08/31/2020   ARF (acute renal failure) (HCC) 01/28/2018   S/P lumbar laminectomy 02/13/2017   Hearing loss of both ears 06/04/2012   Vitamin D deficiency 06/30/2009   POLYMYALGIA RHEUMATICA 03/27/2007   Pernicious anemia 10/25/2006   Essential hypertension 07/12/2006   Osteoporosis 07/12/2006    Orientation RESPIRATION BLADDER Height & Weight     Self, Time, Situation, Place  Normal Continent Weight: 102 lb 4.7 oz (46.4 kg) Height:  5' 0.25" (153 cm)  BEHAVIORAL SYMPTOMS/MOOD NEUROLOGICAL BOWEL NUTRITION STATUS      Continent    AMBULATORY STATUS COMMUNICATION OF NEEDS Skin   Limited Assist   Normal                       Personal Care Assistance Level of Assistance  Bathing, Dressing Bathing Assistance: Limited assistance   Dressing Assistance: Limited assistance     Functional Limitations Info             SPECIAL CARE FACTORS FREQUENCY  PT (By licensed PT), OT (By licensed OT)     PT Frequency: 5xwk OT Frequency: 5x/wk            Contractures Contractures Info: Not present    Additional Factors Info   Code Status, Allergies Code Status Info: Full Allergies Info: see MAR           Current Medications (09/01/2020):  This is the current hospital active medication list Current Facility-Administered Medications  Medication Dose Route Frequency Provider Last Rate Last Admin   0.9 %  sodium chloride infusion   Intravenous Continuous Marinda Elk, MD 75 mL/hr at 09/01/20 1135 New Bag at 09/01/20 1135   acetaminophen (TYLENOL) tablet 650 mg  650 mg Oral Q6H PRN Eduard Clos, MD       Or   acetaminophen (TYLENOL) suppository 650 mg  650 mg Rectal Q6H PRN Eduard Clos, MD       ascorbic acid (VITAMIN C) tablet 500 mg  500 mg Oral Daily Eduard Clos, MD   500 mg at 09/01/20 0825   calcium carbonate (OS-CAL - dosed in mg of elemental calcium) tablet 1,000 mg of elemental calcium  1,000 mg of elemental calcium Oral Q breakfast Eduard Clos, MD   1,000 mg of elemental calcium at 09/01/20 0825   diphenhydrAMINE (BENADRYL) capsule 25 mg  25 mg Oral QHS PRN Eduard Clos, MD       doxycycline (VIBRA-TABS) tablet 100 mg  100 mg Oral BID Marinda Elk, MD       enoxaparin (LOVENOX) injection 30 mg  30 mg Subcutaneous  Q24H Eduard Clos, MD   30 mg at 09/01/20 8182   feeding supplement (ENSURE ENLIVE / ENSURE PLUS) liquid 237 mL  237 mL Oral BID BM Marinda Elk, MD   237 mL at 09/01/20 0827   multivitamin with minerals tablet 1 tablet  1 tablet Oral Daily Marinda Elk, MD   1 tablet at 09/01/20 0825   polyethylene glycol (MIRALAX / GLYCOLAX) packet 17 g  17 g Oral BID Marinda Elk, MD   17 g at 09/01/20 1136   predniSONE (DELTASONE) tablet 2.5 mg  2.5 mg Oral Q breakfast Eduard Clos, MD   2.5 mg at 09/01/20 9937   thiamine tablet 100 mg  100 mg Oral Daily Eduard Clos, MD   100 mg at 09/01/20 0825   vitamin B-12 (CYANOCOBALAMIN) tablet 1,000 mcg  1,000 mcg Oral Daily Marinda Elk, MD   1,000 mcg at  09/01/20 0825     Discharge Medications: Please see discharge summary for a list of discharge medications.  Relevant Imaging Results:  Relevant Lab Results:   Additional Information SS# [id number];  COVID vaccines x 4  Abdulhamid Olgin, LCSW

## 2020-09-01 NOTE — Progress Notes (Signed)
Physical Therapy Treatment Patient Details Name: [name] MRN: [id number] DOB: Oct 25, 1928 Today's Date: 09/01/2020    History of Present Illness 87 y.o. female  recently found to have wound of the right leg for which patient is on antibiotic for the last 3 to 4 weeks and has been on antibiotic now was found to be increasingly confused weak over the last 2 days. Pt with history of polymyalgia rheumatica on prednisone 2.5 mg daily, pernicious anemia,    PT Comments    Improved mobility today, pt ambulated 160' with RW, no loss of balance, and with improved control of RW. Pt's daughter reports pt has been having auditory hallucinations today, she's hearing Christmas music. Supervision for safety recommended due to confusion.     Follow Up Recommendations  SNF;Supervision/Assistance - 24 hour;Supervision for mobility/OOB     Equipment Recommendations  None recommended by PT    Recommendations for Other Services       Precautions / Restrictions Precautions Precautions: Fall Precaution Comments: daughter provided PLOF, she stated pt required assist to prevent falls x 2 in last couple days Restrictions Weight Bearing Restrictions: No    Mobility  Bed Mobility   Bed Mobility: Supine to Sit     Supine to sit: Supervision;HOB elevated          Transfers Overall transfer level: Needs assistance Equipment used: Rolling walker (2 wheeled) Transfers: Sit to/from Stand Sit to Stand: Min guard         General transfer comment: VCs hand placement  Ambulation/Gait Ambulation/Gait assistance: Min guard Gait Distance (Feet): 160 Feet Assistive device: Rolling walker (2 wheeled) Gait Pattern/deviations: Step-through pattern;Decreased stride length Gait velocity: WFL   General Gait Details: no loss of balance, no drifting & no bumping into walls today, min/guard for safety   Stairs             Wheelchair Mobility    Modified Rankin (Stroke Patients  Only)       Balance Overall balance assessment: Needs assistance Sitting-balance support: Feet supported;No upper extremity supported Sitting balance-Leahy Scale: Good     Standing balance support: Bilateral upper extremity supported Standing balance-Leahy Scale: Poor Standing balance comment: relies on BUE support                            Cognition Arousal/Alertness: Awake/alert Behavior During Therapy: WFL for tasks assessed/performed Overall Cognitive Status: Impaired/Different from baseline Area of Impairment: Memory;Safety/judgement                     Memory: Decreased short-term memory   Safety/Judgement: Decreased awareness of deficits;Decreased awareness of safety     General Comments: daughter reports pt "has been hearing Christmas music that isn't there" today      Exercises      General Comments        Pertinent Vitals/Pain Pain Assessment: No/denies pain    Home Living                      Prior Function            PT Goals (current goals can now be found in the care plan section) Acute Rehab PT Goals Patient Stated Goal: return to living independently PT Goal Formulation: With patient/family Time For Goal Achievement: 09/14/20 Potential to Achieve Goals: Good Progress towards PT goals: Progressing toward goals    Frequency    Min 3X/week  PT Plan Current plan remains appropriate    Co-evaluation              AM-PAC PT "6 Clicks" Mobility   Outcome Measure  Help needed turning from your back to your side while in a flat bed without using bedrails?: A Little Help needed moving from lying on your back to sitting on the side of a flat bed without using bedrails?: A Little Help needed moving to and from a bed to a chair (including a wheelchair)?: A Little Help needed standing up from a chair using your arms (e.g., wheelchair or bedside chair)?: A Little Help needed to walk in hospital room?: A  Little Help needed climbing 3-5 steps with a railing? : A Little 6 Click Score: 18    End of Session Equipment Utilized During Treatment: Gait belt Activity Tolerance: Patient tolerated treatment well Patient left: in chair;with call bell/phone within reach;with chair alarm set;with family/visitor present Nurse Communication: Mobility status PT Visit Diagnosis: Difficulty in walking, not elsewhere classified (R26.2);Other abnormalities of gait and mobility (R26.89)     Time: 4944-9675 PT Time Calculation (min) (ACUTE ONLY): 17 min  Charges:  $Gait Training: 8-22 mins                     Ralene Bathe Kistler PT 09/01/2020  Acute Rehabilitation Services Pager [phone] Office [phone]

## 2020-09-01 NOTE — Plan of Care (Signed)
  Problem: Activity: Goal: Risk for activity intolerance will decrease Outcome: Progressing   Problem: Nutrition: Goal: Adequate nutrition will be maintained Outcome: Progressing   Problem: Safety: Goal: Ability to remain free from injury will improve Outcome: Progressing   Problem: Skin Integrity: Goal: Risk for impaired skin integrity will decrease Outcome: Progressing   

## 2020-09-01 NOTE — Progress Notes (Signed)
[facility] PROGRESS NOTE    Progress Note  [name]  ZOX:[id number] DOB: 1928/03/19 DOA: 08/30/2020 PCP: [name], MD     Brief Narrative:   [name] is an 87 y.o. female past medical history of polymyalgia rheumatica on prednisone, pernicious anemia recently found to have right leg wound for which she was on antibiotics 3 weeks, was found recently confused over the last 2 days by daughter.  CT scan of the head was unremarkable, with a rise in her creatinine from 0.9->1.5 compared to 10 days ago, AST mildly elevated at 93   Assessment/Plan:   AKI (acute kidney injury) (HCC) With a sed rate of 10 which makes acute interstitial nephritis unlikely, specific gravity 1030 and a rise of her creatinine from 0.9-1.5 likely prerenal azotemia. Resume IV fluids recheck basic metabolic panel in the morning she appears euvolemic on physical exam.  Generalized weakness and difficulty ambulating: Sed rate of 10 Physical therapy recommended skilled nursing facility. MRI of the brain showed no acute findings.  Acute metabolic encephalopathy: Likely multifactorial in the setting of infectious etiology and acute kidney injury now resolved.  Right leg wound which is being ongoing for 3 to 4 weeks MRI of the tibia shows soft shallow tissue defect minimal subcutaneous edema negative for acute osteomyelitis and right tibiofemoral compartment osteoarthritis as well as a Baker's cyst. Continue doxycycline. Wound care has been consulted ABIs show right brachial index mildly affected on the right lower extremity arterial disease left was unremarkable. Continue Doxy for 4 more days.  Mild elevation in AST: Acute hepatitis panel negative Abdominal ultrasound showed multiple stones no acute findings. Likely due to dehydration.  Acute metabolic encephalopathy: Likely due to acute kidney injury and dehydration now resolved. This is 1.5 resolved likely due to acute  kidney injury.  History of pernicious anemia: Continue B12 supplementation.  Elevated blood pressure without a diagnosis of hypertension: On hydralazine IV as needed.  DVT prophylaxis: lovenox Family Communication: Daughter Status is: Observation  The patient will require care spanning > 2 midnights and should be moved to inpatient because: Hemodynamically unstable  Dispo: The patient is from: Home              Anticipated d/c is to: SNF              Patient currently is not medically stable to d/c.   Difficult to place patient No        Code Status:     Code Status Orders  (From admission, onward)           Start     Ordered   08/31/20 0603  Full code  Continuous        08/31/20 0603           Code Status History     Date Active Date Inactive Code Status Order ID Comments User Context   01/28/2018 0348 01/29/2018 2204 Full Code [id number]  Eduard Clos, MD ED   02/13/2017 1620 02/14/2017 1743 Full Code [id number]  Tia Alert, MD Inpatient      Advance Directive Documentation    Flowsheet Row Most Recent Value  Type of Advance Directive Healthcare Power of Attorney, Living will  Pre-existing out of facility DNR order (yellow form or pink MOST form) --  "MOST" Form in Place? --         IV Access:   Peripheral IV   Procedures and diagnostic studies:   DG Tibia/Fibula Right  Result Date: 08/31/2020 CLINICAL DATA:  Right lower leg wound, initial encounter EXAM: RIGHT TIBIA AND FIBULA - 2 VIEW COMPARISON:  08/29/2012 FINDINGS: Degenerative changes about the knee joint are seen. No acute fracture or dislocation is noted. Soft tissue ulceration is noted anteriorly along the distal tibial metaphysis. No underlying bony erosive changes are seen. IMPRESSION: Soft tissue wound consistent with the given clinical history. No acute bony abnormality is noted Electronically Signed   By: Alcide Clever M.D.   On: 08/31/2020 01:22   CT HEAD WO  CONTRAST  Result Date: 08/30/2020 CLINICAL DATA:  Altered mental status with dizziness, initial encounter EXAM: CT HEAD WITHOUT CONTRAST TECHNIQUE: Contiguous axial images were obtained from the base of the skull through the vertex without intravenous contrast. COMPARISON:  None. FINDINGS: Brain: No evidence of acute infarction, hemorrhage, hydrocephalus, extra-axial collection or mass lesion/mass effect. Chronic atrophic and ischemic changes are noted. Vascular: No hyperdense vessel or unexpected calcification. Skull: Normal. Negative for fracture or focal lesion. Sinuses/Orbits: No acute finding. Other: None. IMPRESSION: Chronic atrophic and ischemic changes without acute abnormality. Electronically Signed   By: Alcide Clever M.D.   On: 08/30/2020 21:07   MR BRAIN WO CONTRAST  Result Date: 08/31/2020 CLINICAL DATA:  Neuro deficit, eval for CVA EXAM: MRI HEAD WITHOUT CONTRAST TECHNIQUE: Multiplanar, multiecho pulse sequences of the brain and surrounding structures were obtained without intravenous contrast. COMPARISON:  No prior MRI, correlation is made with CT 08/30/2020 FINDINGS: Brain: No acute infarction, hemorrhage, hydrocephalus, extra-axial collection or mass lesion. T2 hyperintense foci in the periventricular white matter, likely the sequela of chronic small-vessel ischemic disease. Scattered lacunar infarcts. Vascular: Normal flow voids. Skull and upper cervical spine: Normal marrow signal. Sinuses/Orbits: Status post bilateral lens replacements. Mucosal thickening in the maxillary sinuses. Other: None. IMPRESSION: No acute intracranial process. Electronically Signed   By: Wiliam Ke MD   On: 08/31/2020 09:50   MR TIBIA FIBULA RIGHT WO CONTRAST  Result Date: 08/31/2020 CLINICAL DATA:  Osteomyelitis suspected, tib/fib, xray done. Chronic small callus area at distal right lower leg EXAM: MRI OF LOWER RIGHT EXTREMITY WITHOUT CONTRAST TECHNIQUE: Multiplanar, multisequence MR imaging of the right  tibia and fibula was performed. No intravenous contrast was administered. COMPARISON:  X-ray 08/31/2020 FINDINGS: Bones/Joint/Cartilage No acute fracture or malalignment. No bone marrow edema or periostitis. No bony erosion. Knee and ankle joints are grossly intact. There is tibiofemoral compartment osteoarthritis of the right knee, incompletely characterized at the edge of the field of view. Partially visualized popliteal fossa fluid collection, likely Baker's cyst. No tibiotalar or subtalar joint effusion. Ligaments Grossly intact. Muscles and Tendons Visualized tendinous structures appear intact without tear or tenosynovitis. Slight fatty infiltration of the lower leg musculature. No intramuscular edema or fluid collections. Soft tissues Shallow soft tissue defect at the anteromedial aspect of the lower leg at the level of the distal tibial metaphysis located just medial to the tibialis anterior tendon. Minimal subcutaneous edema. No underlying fluid collection. No deep fascial edema. IMPRESSION: 1. Shallow soft tissue defect at the anteromedial aspect of the lower leg at the level of the distal tibial metaphysis located just medial to the tibialis anterior tendon. Minimal subcutaneous edema. No underlying fluid collection. 2. Negative for acute osteomyelitis of the right tibia or fibula. 3. Right tibiofemoral compartment osteoarthritis with partially visualized popliteal fossa fluid collection, likely Baker's cyst. Electronically Signed   By: Duanne Guess D.O.   On: 08/31/2020 10:12   DG Chest Port 1 View  Result Date:  08/30/2020 CLINICAL DATA:  Altered mental status, weakness EXAM: PORTABLE CHEST 1 VIEW COMPARISON:  01/27/2018 FINDINGS: Cardiac shadow is stable. Aortic calcifications are again seen. The lungs are well aerated bilaterally. No focal infiltrate or effusion is seen. No bony abnormality is noted. IMPRESSION: No active disease. Electronically Signed   By: Alcide CleverMark  Lukens M.D.   On: 08/30/2020  21:05   VAS US ABI WITH/WO TBI  Result Date: 08/31/2020  LOWER EXTREMITY DOPPLER STUDY Patient Name:  [name]  Date of Exam:   08/31/2020 Medical Rec #: [id number]            Accession #:    [id number] Date of Birth: [birth year]            Patient Gender: F Patient Age:   [age] years [facility] Procedure:      VAS US ABI WITH/WO TBI Referring Phys: Lambert KetoABRAHAM FELIZ ORTIZ --------------------------------------------------------------------------------  Indications: Ulceration. High Risk Factors: Hypertension.  Limitations: Today's exam was limited due to an open wound, involuntary patient              movement and Patient pain tolerance. Comparison Study: No prior studies. Performing Technologist: Olen Cordialollins, Greg RVT  Examination Guidelines: A complete evaluation includes at minimum, Doppler waveform signals and systolic blood pressure reading at the level of bilateral brachial, anterior tibial, and posterior tibial arteries, when vessel segments are accessible. Bilateral testing is considered an integral part of a complete examination. Photoelectric Plethysmograph (PPG) waveforms and toe systolic pressure readings are included as required and additional duplex testing as needed. Limited examinations for reoccurring indications may be performed as noted.  ABI Findings: +---------+------------------+-----+---------+--------+ Right    Rt Pressure (mmHg)IndexWaveform Comment  +---------+------------------+-----+---------+--------+ Brachial 168                    triphasic         +---------+------------------+-----+---------+--------+ PTA      147               0.88 biphasic          +---------+------------------+-----+---------+--------+ DP       154               0.92 biphasic          +---------+------------------+-----+---------+--------+ Great Toe77                0.46                   +---------+------------------+-----+---------+--------+  +---------+------------------+-----+---------+-------+ Left     Lt Pressure (mmHg)IndexWaveform Comment +---------+------------------+-----+---------+-------+ Brachial 149                    triphasic        +---------+------------------+-----+---------+-------+ PTA      160               0.95 biphasic         +---------+------------------+-----+---------+-------+ DP       148               0.88 biphasic         +---------+------------------+-----+---------+-------+ Great Toe63                0.38                  +---------+------------------+-----+---------+-------+ +-------+-----------+-----------+------------+------------+ ABI/TBIToday's ABIToday's TBIPrevious ABIPrevious TBI +-------+-----------+-----------+------------+------------+ Right  0.92       0.46                                +-------+-----------+-----------+------------+------------+  Left   0.95       0.38                                +-------+-----------+-----------+------------+------------+   Summary: Right: Resting right ankle-brachial index indicates mild right lower extremity arterial disease. The right toe-brachial index is abnormal. Left: Resting left ankle-brachial index is within normal range. No evidence of significant left lower extremity arterial disease. The left toe-brachial index is abnormal.  *See table(s) above for measurements and observations.  Electronically signed by Coral Else MD on 08/31/2020 at 6:56:27 PM.    Final    US Abdomen Limited RUQ (LIVER/GB)  Result Date: 08/31/2020 CLINICAL DATA:  87 year old female with abnormal LFTs. EXAM: ULTRASOUND ABDOMEN LIMITED RIGHT UPPER QUADRANT COMPARISON:  None. FINDINGS: Gallbladder: Small echogenic gallstones, generally nonshadowing (image 11), individually estimated at 6 mm. No gallbladder wall thickening. No sonographic Murphy sign elicited. Common bile duct: Diameter: Up to 9 mm, mildly dilated. See image 5. No filling defect  identified in the visible duct. Liver: No focal lesion identified. Within normal limits in parenchymal echogenicity. No intrahepatic biliary ductal dilatation is evident. Portal vein is patent on color Doppler imaging with normal direction of blood flow towards the liver. Other: Negative visible right kidney. IMPRESSION: 1. Multiple small gallstones and mildly dilated appearance of the common bile duct. Consider Choledocholithiasis, although no intrahepatic ductal dilatation is identified to confirm acute ductal obstruction. 2. Otherwise negative ultrasound appearance of the liver. Electronically Signed   By: Odessa Fleming M.D.   On: 08/31/2020 09:04     Medical Consultants:   None.   Subjective:    [name] no complaints feels great has not had a bowel movement.  Objective:    Vitals:   08/31/20 1754 08/31/20 2253 09/01/20 0157 09/01/20 0548  BP: (!) 154/90 (!) 152/66 (!) 134/98 (!) 153/55  Pulse: 85 72 68 80  Resp: 17 17 17 16   Temp: 98.1 F (36.7 C) 98.6 F (37 C) 98.5 F (36.9 C) 98.1 F (36.7 C)  TempSrc:      SpO2: 93% 96% 95% 96%  Weight:      Height:       SpO2: 96 %   Intake/Output Summary (Last 24 hours) at 09/01/2020 1011 Last data filed at 09/01/2020 0600 Gross per 24 hour  Intake 612 ml  Output --  Net 612 ml    Filed Weights   08/30/20 2001  Weight: 46.4 kg    Exam: General exam: In no acute distress. Respiratory system: Good air movement and clear to auscultation. Cardiovascular system: S1 & S2 heard, RRR. No JVD. Gastrointestinal system: Abdomen is nondistended, soft and nontender.  Extremities: No pedal edema. Skin: No rashes, lesions or ulcers Psychiatry: Judgement and insight appear normal. Mood & affect appropriate.   Data Reviewed:    Labs: Basic Metabolic Panel: Recent Labs  Lab 08/30/20 2027 08/31/20 0618 09/01/20 0328  NA 132* 135 135  K 4.3 4.6 4.5  CL 95* 105 105  CO2 24 21* 22  GLUCOSE 84 71 92  BUN 23 22 24*   CREATININE 1.55* 1.35* 1.36*  CALCIUM 9.3 8.8* 9.0    GFR Estimated Creatinine Clearance: 20 mL/min (A) (by C-G formula based on SCr of 1.36 mg/dL (H)). Liver Function Tests: Recent Labs  Lab 08/30/20 2027 08/31/20 0618  AST 93* 84*  ALT 28 27  ALKPHOS 59 47  BILITOT 0.3 0.4  PROT 7.4 6.2*  ALBUMIN 4.2 3.7    No results for input(s): LIPASE, AMYLASE in the last 168 hours. Recent Labs  Lab 08/31/20 0618  AMMONIA 16    Coagulation profile No results for input(s): INR, PROTIME in the last 168 hours. COVID-19 Labs  No results for input(s): DDIMER, FERRITIN, LDH, CRP in the last 72 hours.  Lab Results  Component Value Date   SARSCOV2NAA NEGATIVE 08/30/2020   SARSCOV2NAA Not Detected 12/30/2018    CBC: Recent Labs  Lab 08/30/20 2027 08/31/20 0618  WBC 7.9 6.4  NEUTROABS 6.1 4.8  HGB 14.5 12.7  HCT 41.6 37.0  MCV 94.8 97.1  PLT 257 209    Cardiac Enzymes: No results for input(s): CKTOTAL, CKMB, CKMBINDEX, TROPONINI in the last 168 hours. BNP (last 3 results) No results for input(s): PROBNP in the last 8760 hours. CBG: No results for input(s): GLUCAP in the last 168 hours. D-Dimer: No results for input(s): DDIMER in the last 72 hours. Hgb A1c: No results for input(s): HGBA1C in the last 72 hours. Lipid Profile: No results for input(s): CHOL, HDL, LDLCALC, TRIG, CHOLHDL, LDLDIRECT in the last 72 hours. Thyroid function studies: Recent Labs    08/31/20 0618  TSH 1.564    Anemia work up: No results for input(s): VITAMINB12, FOLATE, FERRITIN, TIBC, IRON, RETICCTPCT in the last 72 hours. Sepsis Labs: Recent Labs  Lab 08/30/20 2027 08/30/20 2222 08/31/20 0618  WBC 7.9  --  6.4  LATICACIDVEN 1.8 0.9  --     Microbiology Recent Results (from the past 240 hour(s))  Resp Panel by RT-PCR (Flu A&B, Covid) Nasopharyngeal Swab     Status: None   Collection Time: 08/30/20  9:00 PM   Specimen: Nasopharyngeal Swab; Nasopharyngeal(NP) swabs in vial  transport medium  Result Value Ref Range Status   SARS Coronavirus 2 by RT PCR NEGATIVE NEGATIVE Final    Comment: (NOTE) SARS-CoV-2 target nucleic acids are NOT DETECTED.  The SARS-CoV-2 RNA is generally detectable in upper respiratory specimens during the acute phase of infection. The lowest concentration of SARS-CoV-2 viral copies this assay can detect is 138 copies/mL. A negative result does not preclude SARS-Cov-2 infection and should not be used as the sole basis for treatment or other patient management decisions. A negative result may occur with  improper specimen collection/handling, submission of specimen other than nasopharyngeal swab, presence of viral mutation(s) within the areas targeted by this assay, and inadequate number of viral copies(<138 copies/mL). A negative result must be combined with clinical observations, patient history, and epidemiological information. The expected result is Negative.  Fact Sheet for Patients:  BloggerCourse.com  Fact Sheet for Healthcare Providers:  SeriousBroker.it  This test is no t yet approved or cleared by the [place] FDA and  has been authorized for detection and/or diagnosis of SARS-CoV-2 by FDA under an Emergency Use Authorization (EUA). This EUA will remain  in effect (meaning this test can be used) for the duration of the COVID-19 declaration under Section 564(b)(1) of the Act, 21 U.S.C.section 360bbb-3(b)(1), unless the authorization is terminated  or revoked sooner.       Influenza A by PCR NEGATIVE NEGATIVE Final   Influenza B by PCR NEGATIVE NEGATIVE Final    Comment: (NOTE) The Xpert Xpress SARS-CoV-2/FLU/RSV plus assay is intended as an aid in the diagnosis of influenza from Nasopharyngeal swab specimens and should not be used as a sole basis for treatment. Nasal washings and aspirates are unacceptable for Xpert Xpress SARS-CoV-2/FLU/RSV testing.  Fact  Sheet for Patients: BloggerCourse.com  Fact Sheet for Healthcare Providers: SeriousBroker.it  This test is not yet approved or cleared by the [place] FDA and has been authorized for detection and/or diagnosis of SARS-CoV-2 by FDA under an Emergency Use Authorization (EUA). This EUA will remain in effect (meaning this test can be used) for the duration of the COVID-19 declaration under Section 564(b)(1) of the Act, 21 U.S.C. section 360bbb-3(b)(1), unless the authorization is terminated or revoked.  Performed at [facility], [address] [place], [place] [address]      Medications:    vitamin C  500 mg Oral Daily   calcium carbonate  1,000 mg of elemental calcium Oral Q breakfast   doxycycline  100 mg Oral BID   enoxaparin (LOVENOX) injection  30 mg Subcutaneous Q24H   feeding supplement  237 mL Oral BID BM   multivitamin with minerals  1 tablet Oral Daily   predniSONE  2.5 mg Oral Q breakfast   thiamine  100 mg Oral Daily   vitamin B-12  1,000 mcg Oral Daily   Continuous Infusions:      LOS: 1 day   Marinda Elk  [facility]  09/01/2020, 10:11 AM

## 2020-09-01 NOTE — TOC Progression Note (Signed)
Transition of Care [facility]) - Progression Note    Patient Details  Name: MYRTH DAHAN MRN: [id number] Date of Birth: April 27, 1928  [facility] CM/SW Contact  Amada Jupiter, LCSW Phone Number: 09/01/2020, 3:51 PM  Clinical Narrative:    Have reviewed SNF bed offers with pt and daughter and they have accepted bed at [facility] in [place] who could admit pt tomorrow. Have alerted MD who notes she will likely be medically ready for transfer.  Will begin insurance authorization.   Expected Discharge Plan: Skilled Nursing Facility Barriers to Discharge: Continued Medical Work up, English as a second language teacher  Expected Discharge Plan and Services Expected Discharge Plan: Skilled Nursing Facility In-house Referral: Clinical Social Work     Living arrangements for the past 2 months: Single Family Home                 DME Arranged: N/A DME Agency: NA                   Social Determinants of Health (SDOH) Interventions    Readmission Risk Interventions No flowsheet data found.

## 2020-09-02 DIAGNOSIS — M818 Other osteoporosis without current pathological fracture: Secondary | ICD-10-CM | POA: Diagnosis not present

## 2020-09-02 DIAGNOSIS — Z7401 Bed confinement status: Secondary | ICD-10-CM | POA: Diagnosis not present

## 2020-09-02 DIAGNOSIS — R945 Abnormal results of liver function studies: Secondary | ICD-10-CM | POA: Diagnosis not present

## 2020-09-02 DIAGNOSIS — R5383 Other fatigue: Secondary | ICD-10-CM | POA: Diagnosis not present

## 2020-09-02 DIAGNOSIS — N179 Acute kidney failure, unspecified: Secondary | ICD-10-CM | POA: Diagnosis not present

## 2020-09-02 DIAGNOSIS — E440 Moderate protein-calorie malnutrition: Secondary | ICD-10-CM

## 2020-09-02 DIAGNOSIS — R7401 Elevation of levels of liver transaminase levels: Secondary | ICD-10-CM | POA: Diagnosis not present

## 2020-09-02 DIAGNOSIS — M109 Gout, unspecified: Secondary | ICD-10-CM | POA: Diagnosis not present

## 2020-09-02 DIAGNOSIS — M353 Polymyalgia rheumatica: Secondary | ICD-10-CM | POA: Diagnosis not present

## 2020-09-02 DIAGNOSIS — H9193 Unspecified hearing loss, bilateral: Secondary | ICD-10-CM | POA: Diagnosis not present

## 2020-09-02 DIAGNOSIS — I10 Essential (primary) hypertension: Secondary | ICD-10-CM | POA: Diagnosis not present

## 2020-09-02 DIAGNOSIS — D510 Vitamin B12 deficiency anemia due to intrinsic factor deficiency: Secondary | ICD-10-CM | POA: Diagnosis not present

## 2020-09-02 DIAGNOSIS — M179 Osteoarthritis of knee, unspecified: Secondary | ICD-10-CM | POA: Diagnosis not present

## 2020-09-02 DIAGNOSIS — E785 Hyperlipidemia, unspecified: Secondary | ICD-10-CM | POA: Diagnosis not present

## 2020-09-02 DIAGNOSIS — S91001A Unspecified open wound, right ankle, initial encounter: Secondary | ICD-10-CM | POA: Diagnosis not present

## 2020-09-02 DIAGNOSIS — R410 Disorientation, unspecified: Secondary | ICD-10-CM | POA: Diagnosis not present

## 2020-09-02 DIAGNOSIS — M810 Age-related osteoporosis without current pathological fracture: Secondary | ICD-10-CM | POA: Diagnosis not present

## 2020-09-02 DIAGNOSIS — R5381 Other malaise: Secondary | ICD-10-CM | POA: Diagnosis not present

## 2020-09-02 DIAGNOSIS — J439 Emphysema, unspecified: Secondary | ICD-10-CM | POA: Diagnosis not present

## 2020-09-02 DIAGNOSIS — R404 Transient alteration of awareness: Secondary | ICD-10-CM | POA: Diagnosis not present

## 2020-09-02 DIAGNOSIS — S91001D Unspecified open wound, right ankle, subsequent encounter: Secondary | ICD-10-CM | POA: Diagnosis not present

## 2020-09-02 LAB — BASIC METABOLIC PANEL
Anion gap: 7 (ref 5–15)
BUN: 26 mg/dL — ABNORMAL HIGH (ref 8–23)
CO2: 24 mmol/L (ref 22–32)
Calcium: 9.4 mg/dL (ref 8.9–10.3)
Chloride: 107 mmol/L (ref 98–111)
Creatinine, Ser: 1.27 mg/dL — ABNORMAL HIGH (ref 0.44–1.00)
GFR, Estimated: 40 mL/min — ABNORMAL LOW (ref >60)
Glucose, Bld: 101 mg/dL — ABNORMAL HIGH (ref 70–99)
Potassium: 4.3 mmol/L (ref 3.5–5.1)
Sodium: 138 mmol/L (ref 135–145)

## 2020-09-02 LAB — RESP PANEL BY RT-PCR (FLU A&B, COVID) ARPGX2
Influenza A by PCR: NEGATIVE
Influenza B by PCR: NEGATIVE
SARS Coronavirus 2 by RT PCR: NEGATIVE

## 2020-09-02 MED ORDER — HALOPERIDOL LACTATE 5 MG/ML IJ SOLN
2.0000 mg | Freq: Once | INTRAMUSCULAR | Status: AC
  Administered 2020-09-02: 2 mg via INTRAVENOUS

## 2020-09-02 MED ORDER — HALOPERIDOL LACTATE 5 MG/ML IJ SOLN
INTRAMUSCULAR | Status: AC
  Filled 2020-09-02: qty 1

## 2020-09-02 MED ORDER — HYDRALAZINE HCL 25 MG PO TABS
25.0000 mg | ORAL_TABLET | Freq: Once | ORAL | Status: AC
  Administered 2020-09-02: 25 mg via ORAL
  Filled 2020-09-02: qty 1

## 2020-09-02 MED ORDER — DOXYCYCLINE HYCLATE 100 MG PO TABS: 100.0000 mg | ORAL_TABLET | Freq: Two times a day (BID) | ORAL | 0 refills | Status: AC

## 2020-09-02 NOTE — Progress Notes (Signed)
Patient HOH, disoriented.  Had called 911, writer able to tell operator Patient at [facility] in room 1332 and under medical care. Told operator number to call if needed. Patient paranoid and won't lay back in the bed. Had PRN Benadryl earlier in shift.  Has irregular heart rhythm at baseline. Is refusing any PO meds at this time. Is upset that her dog is being put down.  Is content to sit upright in her bed.  Denies wanting anything at this time.  IV intact and not pulling at dressing.  Encouraged Patient to use nurse call button. Patient called 911 again at this time and Patient allowed Writer to talk to the operator and give Patient name room number and Nurse name and number.  Writer offered the bathroom, food/fluids, asked if there is anything she would like. "No I don't want a thing." Bed alarm intact and able to be redirected at this time. Safety maintained.

## 2020-09-02 NOTE — Discharge Summary (Signed)
Physician Discharge Summary  [name]:[id number] DOB: [birth year] DOA: 08/30/2020  PCP: [name], MD  Admit date: 08/30/2020 Discharge date: 09/02/2020  Admitted From: home Disposition:  SNF  Recommendations for Outpatient Follow-up:  Follow up with PCP in 1-2 weeks Please obtain BMP/CBC in one week   Home Health:No Equipment/Devices:none  Discharge Condition:Guarded CODE STATUS:Full Diet recommendation: Heart Healthy   Brief/Interim Summary: 87 y.o. female past medical history of polymyalgia rheumatica on prednisone, pernicious anemia recently found to have right leg wound for which she was on antibiotics 3 weeks, was found recently confused over the last 2 days by daughter.  CT scan of the head was unremarkable, with a rise in her creatinine from 0.9->1.5 compared to 10 days ago, AST mildly elevated at 93  Discharge Diagnoses:  Principal Problem:   AKI (acute kidney injury) (HCC) Active Problems:   POLYMYALGIA RHEUMATICA   ARF (acute renal failure) (HCC)   Weakness   Malnutrition of moderate degree  Acute kidney injury: With a sed rate of 10, specific gravity of 1030 with a baseline creatinine of 0.9 on admission 1.5 likely prerenal azotemia resolved with IV fluid hydration.  Generalized weakness and difficulty ambulating: MRI of the brain showed no acute findings physical therapy evaluated the patient the recommended skilled nursing facility.  Acute metabolic encephalopathy: Likely due to infectious etiology and dehydration question baseline dementia.  Right leg wound: MRI shows soft tissue swelling with minimal edema no osteomyelitis. She was changed to oral Doxy which should continue for 2 weeks. ABI showed right brachial ankle index mildly affected on the right.  Mildly elevated AST, Acute otitis panel negative abdominal ultrasound showed no stones likely due to dehydration.    Discharge Instructions  Discharge Instructions     Diet -  low sodium heart healthy   Complete by: As directed    Increase activity slowly   Complete by: As directed    No wound care   Complete by: As directed       Allergies as of 09/02/2020       Reactions   Naproxen Other (See Comments)   Severe pain in joints and hands   Oxycodone-acetaminophen Other (See Comments)   REACTION: gastric reaction   Aspirin Other (See Comments)   "threw up blood"   Codeine Other (See Comments)   "threw up blood"        Medication List     STOP taking these medications    sulfamethoxazole-trimethoprim 800-160 MG tablet Commonly known as: BACTRIM DS       TAKE these medications    alendronate 70 MG tablet Commonly known as: FOSAMAX TAKE 1 TABLET BY MOUTH  WEEKLY WITH 8 OZ OF PLAIN  WATER 30 MINUTES BEFORE  FIRST FOOD, DRINK OR MEDS.  STAY UPRIGHT FOR 30 MINS   diphenhydrAMINE 25 MG tablet Commonly known as: BENADRYL Take 25 mg by mouth at bedtime as needed for sleep.   doxycycline 100 MG tablet Commonly known as: VIBRA-TABS Take 1 tablet (100 mg total) by mouth 2 (two) times daily for 5 days.   furosemide 20 MG tablet Commonly known as: LASIX Take 1 tablet (20 mg total) by mouth daily as needed. What changed: reasons to take this   mupirocin ointment 2 % Commonly known as: BACTROBAN Apply 1 application topically 2 (two) times daily.   oyster calcium 500 MG Tabs tablet Take 1,000 mg of elemental calcium by mouth daily.   predniSONE 2.5 MG tablet Commonly known as:  DELTASONE TAKE 1 TABLET BY MOUTH  DAILY WITH BREAKFAST   Vitamin B-12 1000 MCG Subl Place 1,000 mcg under the tongue daily.   vitamin C 250 MG tablet Commonly known as: ASCORBIC ACID Take 500 mg by mouth daily.        Contact information for follow-up providers     [name], MD. Schedule an appointment as soon as possible for a visit in 2 days.   Specialty: Family Medicine Why: As needed Contact information: [address] [place] [facility] [place]  [place] [address] [phone]              Contact information for after-discharge care     Destination     [facility] .   Service: Skilled Nursing Contact information: [address] [place] [address] [phone]                    Allergies  Allergen Reactions   Naproxen Other (See Comments)    Severe pain in joints and hands   Oxycodone-Acetaminophen Other (See Comments)    REACTION: gastric reaction   Aspirin Other (See Comments)    "threw up blood"   Codeine Other (See Comments)    "threw up blood"    Consultations: None   Procedures/Studies: DG Tibia/Fibula Right  Result Date: 08/31/2020 CLINICAL DATA:  Right lower leg wound, initial encounter EXAM: RIGHT TIBIA AND FIBULA - 2 VIEW COMPARISON:  08/29/2012 FINDINGS: Degenerative changes about the knee joint are seen. No acute fracture or dislocation is noted. Soft tissue ulceration is noted anteriorly along the distal tibial metaphysis. No underlying bony erosive changes are seen. IMPRESSION: Soft tissue wound consistent with the given clinical history. No acute bony abnormality is noted Electronically Signed   By: Alcide Clever M.D.   On: 08/31/2020 01:22   CT HEAD WO CONTRAST  Result Date: 08/30/2020 CLINICAL DATA:  Altered mental status with dizziness, initial encounter EXAM: CT HEAD WITHOUT CONTRAST TECHNIQUE: Contiguous axial images were obtained from the base of the skull through the vertex without intravenous contrast. COMPARISON:  None. FINDINGS: Brain: No evidence of acute infarction, hemorrhage, hydrocephalus, extra-axial collection or mass lesion/mass effect. Chronic atrophic and ischemic changes are noted. Vascular: No hyperdense vessel or unexpected calcification. Skull: Normal. Negative for fracture or focal lesion. Sinuses/Orbits: No acute finding. Other: None. IMPRESSION: Chronic atrophic and ischemic changes without acute abnormality. Electronically  Signed   By: Alcide Clever M.D.   On: 08/30/2020 21:07   MR BRAIN WO CONTRAST  Result Date: 08/31/2020 CLINICAL DATA:  Neuro deficit, eval for CVA EXAM: MRI HEAD WITHOUT CONTRAST TECHNIQUE: Multiplanar, multiecho pulse sequences of the brain and surrounding structures were obtained without intravenous contrast. COMPARISON:  No prior MRI, correlation is made with CT 08/30/2020 FINDINGS: Brain: No acute infarction, hemorrhage, hydrocephalus, extra-axial collection or mass lesion. T2 hyperintense foci in the periventricular white matter, likely the sequela of chronic small-vessel ischemic disease. Scattered lacunar infarcts. Vascular: Normal flow voids. Skull and upper cervical spine: Normal marrow signal. Sinuses/Orbits: Status post bilateral lens replacements. Mucosal thickening in the maxillary sinuses. Other: None. IMPRESSION: No acute intracranial process. Electronically Signed   By: Wiliam Ke MD   On: 08/31/2020 09:50   MR TIBIA FIBULA RIGHT WO CONTRAST  Result Date: 08/31/2020 CLINICAL DATA:  Osteomyelitis suspected, tib/fib, xray done. Chronic small callus area at distal right lower leg EXAM: MRI OF LOWER RIGHT EXTREMITY WITHOUT CONTRAST TECHNIQUE: Multiplanar, multisequence MR imaging of the  right tibia and fibula was performed. No intravenous contrast was administered. COMPARISON:  X-ray 08/31/2020 FINDINGS: Bones/Joint/Cartilage No acute fracture or malalignment. No bone marrow edema or periostitis. No bony erosion. Knee and ankle joints are grossly intact. There is tibiofemoral compartment osteoarthritis of the right knee, incompletely characterized at the edge of the field of view. Partially visualized popliteal fossa fluid collection, likely Baker's cyst. No tibiotalar or subtalar joint effusion. Ligaments Grossly intact. Muscles and Tendons Visualized tendinous structures appear intact without tear or tenosynovitis. Slight fatty infiltration of the lower leg musculature. No intramuscular edema  or fluid collections. Soft tissues Shallow soft tissue defect at the anteromedial aspect of the lower leg at the level of the distal tibial metaphysis located just medial to the tibialis anterior tendon. Minimal subcutaneous edema. No underlying fluid collection. No deep fascial edema. IMPRESSION: 1. Shallow soft tissue defect at the anteromedial aspect of the lower leg at the level of the distal tibial metaphysis located just medial to the tibialis anterior tendon. Minimal subcutaneous edema. No underlying fluid collection. 2. Negative for acute osteomyelitis of the right tibia or fibula. 3. Right tibiofemoral compartment osteoarthritis with partially visualized popliteal fossa fluid collection, likely Baker's cyst. Electronically Signed   By: Duanne Guess D.O.   On: 08/31/2020 10:12   DG Chest Port 1 View  Result Date: 08/30/2020 CLINICAL DATA:  Altered mental status, weakness EXAM: PORTABLE CHEST 1 VIEW COMPARISON:  01/27/2018 FINDINGS: Cardiac shadow is stable. Aortic calcifications are again seen. The lungs are well aerated bilaterally. No focal infiltrate or effusion is seen. No bony abnormality is noted. IMPRESSION: No active disease. Electronically Signed   By: Alcide Clever M.D.   On: 08/30/2020 21:05   VAS [place] ABI WITH/WO TBI  Result Date: 08/31/2020  LOWER EXTREMITY DOPPLER STUDY Patient Name:  GEORGETTA CRAFTON  Date of Exam:   08/31/2020 Medical Rec #: [id number]            Accession #:    [id number] Date of Birth: 1928/12/05            Patient Gender: F Patient Age:   89 years Exam Location:  [facility] Procedure:      VAS [place] ABI WITH/WO TBI Referring Phys: Lambert Keto ORTIZ --------------------------------------------------------------------------------  Indications: Ulceration. High Risk Factors: Hypertension.  Limitations: Today's exam was limited due to an open wound, involuntary patient              movement and Patient pain tolerance. Comparison Study: No prior studies.  Performing Technologist: Olen Cordial RVT  Examination Guidelines: A complete evaluation includes at minimum, Doppler waveform signals and systolic blood pressure reading at the level of bilateral brachial, anterior tibial, and posterior tibial arteries, when vessel segments are accessible. Bilateral testing is considered an integral part of a complete examination. Photoelectric Plethysmograph (PPG) waveforms and toe systolic pressure readings are included as required and additional duplex testing as needed. Limited examinations for reoccurring indications may be performed as noted.  ABI Findings: +---------+------------------+-----+---------+--------+ Right    Rt Pressure (mmHg)IndexWaveform Comment  +---------+------------------+-----+---------+--------+ Brachial 168                    triphasic         +---------+------------------+-----+---------+--------+ PTA      147               0.88 biphasic          +---------+------------------+-----+---------+--------+ DP       154  0.92 biphasic          +---------+------------------+-----+---------+--------+ Great Toe77                0.46                   +---------+------------------+-----+---------+--------+ +---------+------------------+-----+---------+-------+ Left     Lt Pressure (mmHg)IndexWaveform Comment +---------+------------------+-----+---------+-------+ Brachial 149                    triphasic        +---------+------------------+-----+---------+-------+ PTA      160               0.95 biphasic         +---------+------------------+-----+---------+-------+ DP       148               0.88 biphasic         +---------+------------------+-----+---------+-------+ Great Toe63                0.38                  +---------+------------------+-----+---------+-------+ +-------+-----------+-----------+------------+------------+ ABI/TBIToday's ABIToday's TBIPrevious ABIPrevious TBI  +-------+-----------+-----------+------------+------------+ Right  0.92       0.46                                +-------+-----------+-----------+------------+------------+ Left   0.95       0.38                                +-------+-----------+-----------+------------+------------+   Summary: Right: Resting right ankle-brachial index indicates mild right lower extremity arterial disease. The right toe-brachial index is abnormal. Left: Resting left ankle-brachial index is within normal range. No evidence of significant left lower extremity arterial disease. The left toe-brachial index is abnormal.  *See table(s) above for measurements and observations.  Electronically signed by Coral ElseVance Brabham MD on 08/31/2020 at 6:56:27 PM.    Final    US Abdomen Limited RUQ (LIVER/GB)  Result Date: 08/31/2020 CLINICAL DATA:  87 year old female with abnormal LFTs. EXAM: ULTRASOUND ABDOMEN LIMITED RIGHT UPPER QUADRANT COMPARISON:  None. FINDINGS: Gallbladder: Small echogenic gallstones, generally nonshadowing (image 11), individually estimated at 6 mm. No gallbladder wall thickening. No sonographic Murphy sign elicited. Common bile duct: Diameter: Up to 9 mm, mildly dilated. See image 5. No filling defect identified in the visible duct. Liver: No focal lesion identified. Within normal limits in parenchymal echogenicity. No intrahepatic biliary ductal dilatation is evident. Portal vein is patent on color Doppler imaging with normal direction of blood flow towards the liver. Other: Negative visible right kidney. IMPRESSION: 1. Multiple small gallstones and mildly dilated appearance of the common bile duct. Consider Choledocholithiasis, although no intrahepatic ductal dilatation is identified to confirm acute ductal obstruction. 2. Otherwise negative ultrasound appearance of the liver. Electronically Signed   By: Odessa FlemingH  Hall M.D.   On: 08/31/2020 09:04   (Echo, Carotid, EGD, Colonoscopy, ERCP)    Subjective: No  complaints  Discharge Exam: Vitals:   09/01/20 2111 09/02/20 1018  BP: (!) 167/76 (!) 198/67  Pulse: 69 94  Resp: 18 16  Temp: 98.1 F (36.7 C) 98.1 F (36.7 C)  SpO2: 97% 95%   Vitals:   09/01/20 0157 09/01/20 0548 09/01/20 2111 09/02/20 1018  BP: (!) 134/98 (!) 153/55 (!) 167/76 (!) 198/67  Pulse: 68 80 69 94  Resp:  17 16 18 16   Temp: 98.5 F (36.9 C) 98.1 F (36.7 C) 98.1 F (36.7 C) 98.1 F (36.7 C)  TempSrc:   Oral Oral  SpO2: 95% 96% 97% 95%  Weight:      Height:        General: Pt is alert, awake, not in acute distress Cardiovascular: RRR, S1/S2 +, no rubs, no gallops Respiratory: CTA bilaterally, no wheezing, no rhonchi Abdominal: Soft, NT, ND, bowel sounds + Extremities: no edema, no cyanosis    The results of significant diagnostics from this hospitalization (including imaging, microbiology, ancillary and laboratory) are listed below for reference.     Microbiology: Recent Results (from the past 240 hour(s))  Resp Panel by RT-PCR (Flu A&B, Covid) Nasopharyngeal Swab     Status: None   Collection Time: 08/30/20  9:00 PM   Specimen: Nasopharyngeal Swab; Nasopharyngeal(NP) swabs in vial transport medium  Result Value Ref Range Status   SARS Coronavirus 2 by RT PCR NEGATIVE NEGATIVE Final    Comment: (NOTE) SARS-CoV-2 target nucleic acids are NOT DETECTED.  The SARS-CoV-2 RNA is generally detectable in upper respiratory specimens during the acute phase of infection. The lowest concentration of SARS-CoV-2 viral copies this assay can detect is 138 copies/mL. A negative result does not preclude SARS-Cov-2 infection and should not be used as the sole basis for treatment or other patient management decisions. A negative result may occur with  improper specimen collection/handling, submission of specimen other than nasopharyngeal swab, presence of viral mutation(s) within the areas targeted by this assay, and inadequate number of viral copies(<138  copies/mL). A negative result must be combined with clinical observations, patient history, and epidemiological information. The expected result is Negative.  Fact Sheet for Patients:  10/30/20  Fact Sheet for Healthcare Providers:  BloggerCourse.com  This test is no t yet approved or cleared by the [place] FDA and  has been authorized for detection and/or diagnosis of SARS-CoV-2 by FDA under an Emergency Use Authorization (EUA). This EUA will remain  in effect (meaning this test can be used) for the duration of the COVID-19 declaration under Section 564(b)(1) of the Act, 21 U.S.C.section 360bbb-3(b)(1), unless the authorization is terminated  or revoked sooner.       Influenza A by PCR NEGATIVE NEGATIVE Final   Influenza B by PCR NEGATIVE NEGATIVE Final    Comment: (NOTE) The Xpert Xpress SARS-CoV-2/FLU/RSV plus assay is intended as an aid in the diagnosis of influenza from Nasopharyngeal swab specimens and should not be used as a sole basis for treatment. Nasal washings and aspirates are unacceptable for Xpert Xpress SARS-CoV-2/FLU/RSV testing.  Fact Sheet for Patients: [place]  Fact Sheet for Healthcare Providers: BloggerCourse.com  This test is not yet approved or cleared by the [place] FDA and has been authorized for detection and/or diagnosis of SARS-CoV-2 by FDA under an Emergency Use Authorization (EUA). This EUA will remain in effect (meaning this test can be used) for the duration of the COVID-19 declaration under Section 564(b)(1) of the Act, 21 U.S.C. section 360bbb-3(b)(1), unless the authorization is terminated or revoked.  Performed at [facility], [address] [place], [place]      Labs: BNP (last 3 results) No results for input(s): BNP in the last 8760 hours. Basic Metabolic Panel: Recent Labs  Lab  08/30/20 2027 08/31/20 0618 09/01/20 0328 09/02/20 0608  NA 132* 135 135 138  K 4.3 4.6 4.5 4.3  CL 95* 105 105 107  CO2 24 21* 22 24  GLUCOSE 84 71 92 101*  BUN 23 22 24* 26*  CREATININE 1.55* 1.35* 1.36* 1.27*  CALCIUM 9.3 8.8* 9.0 9.4   Liver Function Tests: Recent Labs  Lab 08/30/20 2027 08/31/20 0618  AST 93* 84*  ALT 28 27  ALKPHOS 59 47  BILITOT 0.3 0.4  PROT 7.4 6.2*  ALBUMIN 4.2 3.7   No results for input(s): LIPASE, AMYLASE in the last 168 hours. Recent Labs  Lab 08/31/20 0618  AMMONIA 16   CBC: Recent Labs  Lab 08/30/20 2027 08/31/20 0618  WBC 7.9 6.4  NEUTROABS 6.1 4.8  HGB 14.5 12.7  HCT 41.6 37.0  MCV 94.8 97.1  PLT 257 209   Cardiac Enzymes: No results for input(s): CKTOTAL, CKMB, CKMBINDEX, TROPONINI in the last 168 hours. BNP: Invalid input(s): POCBNP CBG: No results for input(s): GLUCAP in the last 168 hours. D-Dimer No results for input(s): DDIMER in the last 72 hours. Hgb A1c No results for input(s): HGBA1C in the last 72 hours. Lipid Profile No results for input(s): CHOL, HDL, LDLCALC, TRIG, CHOLHDL, LDLDIRECT in the last 72 hours. Thyroid function studies Recent Labs    08/31/20 0618  TSH 1.564   Anemia work up No results for input(s): VITAMINB12, FOLATE, FERRITIN, TIBC, IRON, RETICCTPCT in the last 72 hours. Urinalysis    Component Value Date/Time   COLORURINE YELLOW 08/30/2020 2224   APPEARANCEUR CLEAR 08/30/2020 2224   LABSPEC >1.030 (H) 08/30/2020 2224   PHURINE 6.0 08/30/2020 2224   GLUCOSEU NEGATIVE 08/30/2020 2224   HGBUR NEGATIVE 08/30/2020 2224   HGBUR negative 06/01/2008 0943   BILIRUBINUR SMALL (A) 08/30/2020 2224   BILIRUBINUR n 10/02/2012 1029   KETONESUR 15 (A) 08/30/2020 2224   PROTEINUR NEGATIVE 08/30/2020 2224   UROBILINOGEN 0.2 10/02/2012 1029   UROBILINOGEN 0.2 07/01/2010 2207   NITRITE NEGATIVE 08/30/2020 2224   LEUKOCYTESUR NEGATIVE 08/30/2020 2224   Sepsis Labs Invalid input(s):  PROCALCITONIN,  WBC,  LACTICIDVEN Microbiology Recent Results (from the past 240 hour(s))  Resp Panel by RT-PCR (Flu A&B, Covid) Nasopharyngeal Swab     Status: None   Collection Time: 08/30/20  9:00 PM   Specimen: Nasopharyngeal Swab; Nasopharyngeal(NP) swabs in vial transport medium  Result Value Ref Range Status   SARS Coronavirus 2 by RT PCR NEGATIVE NEGATIVE Final    Comment: (NOTE) SARS-CoV-2 target nucleic acids are NOT DETECTED.  The SARS-CoV-2 RNA is generally detectable in upper respiratory specimens during the acute phase of infection. The lowest concentration of SARS-CoV-2 viral copies this assay can detect is 138 copies/mL. A negative result does not preclude SARS-Cov-2 infection and should not be used as the sole basis for treatment or other patient management decisions. A negative result may occur with  improper specimen collection/handling, submission of specimen other than nasopharyngeal swab, presence of viral mutation(s) within the areas targeted by this assay, and inadequate number of viral copies(<138 copies/mL). A negative result must be combined with clinical observations, patient history, and epidemiological information. The expected result is Negative.  Fact Sheet for Patients:  BloggerCourse.com  Fact Sheet for Healthcare Providers:  [employer]ious[employer]ker.it  This test is no t yet approved or cleared by the [place] FDA and  has been authorized for detection and/or diagnosis of SARS-CoV-2 by FDA under an Emergency Use Authorization (EUA). This EUA will remain  in effect (meaning this test can be used) for the duration of the COVID-19 declaration under Section 564(b)(1) of the Act, 21 U.S.C.section 360bbb-3(b)(1), unless  the authorization is terminated  or revoked sooner.       Influenza A by PCR NEGATIVE NEGATIVE Final   Influenza B by PCR NEGATIVE NEGATIVE Final    Comment: (NOTE) The Xpert Xpress  SARS-CoV-2/FLU/RSV plus assay is intended as an aid in the diagnosis of influenza from Nasopharyngeal swab specimens and should not be used as a sole basis for treatment. Nasal washings and aspirates are unacceptable for Xpert Xpress SARS-CoV-2/FLU/RSV testing.  Fact Sheet for Patients: BloggerCourse.com  Fact Sheet for Healthcare Providers: SeriousBroker.it  This test is not yet approved or cleared by the [place] FDA and has been authorized for detection and/or diagnosis of SARS-CoV-2 by FDA under an Emergency Use Authorization (EUA). This EUA will remain in effect (meaning this test can be used) for the duration of the COVID-19 declaration under Section 564(b)(1) of the Act, 21 U.S.C. section 360bbb-3(b)(1), unless the authorization is terminated or revoked.  Performed at [facility], [address] [place], [place] [address]      Time coordinating discharge: Over 30 minutes  SIGNED:   Marinda Elk, MD  [facility] 09/02/2020, 11:15 AM Pager   If 7PM-7AM, please contact night-coverage [email or web address] Password [email or web address]

## 2020-09-02 NOTE — Progress Notes (Addendum)
1316 Attempted to give report, left callback number to voicemail.   Report given to [employer], RN, all questions and concerns addressed.  Pt not in distress, to discharge to SNF via [facility].

## 2020-09-02 NOTE — Plan of Care (Signed)
  Problem: Clinical Measurements: Goal: Diagnostic test results will improve Outcome: Progressing   Problem: Activity: Goal: Risk for activity intolerance will decrease Outcome: Progressing   Problem: Safety: Goal: Ability to remain free from injury will improve Outcome: Progressing   Problem: Skin Integrity: Goal: Risk for impaired skin integrity will decrease Outcome: Progressing   

## 2020-09-02 NOTE — Care Management Important Message (Signed)
Important Message  Patient Details IM present to Daughter Keith Rake Name: ADILEE LEMME MRN: [id number] Date of Birth: [birth year]   Medicare Important Message Given:  Yes     Caren Macadam 09/02/2020, 9:15 AM

## 2020-09-02 NOTE — TOC Transition Note (Signed)
Transition of Care [facility]) - CM/SW Discharge Note   Patient Details  Name: [name] MRN: [id number] Date of Birth: 05-25-1928  Transition of Care [facility]) CM/SW Contact:  Amada Jupiter, LCSW Phone Number: 09/02/2020, 12:51 PM   Clinical Narrative:    Pt medically cleared for dc today to SNF.  Have received insurance authorization and bed accepted at [facility].  PTAR called at 12:45.  RN to call report to [phone] and ask for "back hall RN".  No further TOC needs.   Final next level of care: Skilled Nursing Facility Barriers to Discharge: Barriers Resolved   Patient Goals and CMS Choice Patient states their goals for this hospitalization and ongoing recovery are:: "to get back to my dog"      Discharge Placement   Existing PASRR number confirmed : 09/01/20          Patient chooses bed at: [facility] Patient to be transferred to facility by: PTAR Name of family member notified: daughter Patient and family notified of of transfer: 09/02/20  Discharge Plan and Services In-house Referral: Clinical Social Work              DME Arranged: N/A DME Agency: NA                  Social Determinants of Health (SDOH) Interventions     Readmission Risk Interventions No flowsheet data found.

## 2020-09-02 NOTE — Progress Notes (Signed)
Mobility Specialist - Progress Note    09/02/20 1438  Mobility  Activity Ambulated in hall  Level of Assistance Contact guard assist, steadying assist  Assistive Device Front wheel walker  Distance Ambulated (ft) 200 ft  Mobility Out of bed to chair with meals;Ambulated with assistance in hallway  Mobility Response Tolerated well  Mobility performed by Mobility specialist  $Mobility charge 1 Mobility   Pt ambulated ~200 ft in hallway using RW. Pt did not experience pain or dizziness, but stated feeling SOB towards the end of ambulation. Pt returned to recliner after session with chair alarm on and family in room. Pt is awaiting D/C.  Arliss Journey Mobility Specialist Acute Rehabilitation Services Phone: [phone] 09/02/20, 2:40 PM

## 2020-09-05 ENCOUNTER — Ambulatory Visit: Payer: Medicare Other | Admitting: Family Medicine

## 2020-09-05 LAB — VITAMIN B1: Vitamin B1 (Thiamine): 129.1 nmol/L (ref 66.5–200.0)

## 2020-09-06 DIAGNOSIS — M353 Polymyalgia rheumatica: Secondary | ICD-10-CM | POA: Diagnosis not present

## 2020-09-06 DIAGNOSIS — N179 Acute kidney failure, unspecified: Secondary | ICD-10-CM | POA: Diagnosis not present

## 2020-09-06 DIAGNOSIS — M810 Age-related osteoporosis without current pathological fracture: Secondary | ICD-10-CM | POA: Diagnosis not present

## 2020-09-06 DIAGNOSIS — I10 Essential (primary) hypertension: Secondary | ICD-10-CM | POA: Diagnosis not present

## 2020-09-06 DIAGNOSIS — S91001A Unspecified open wound, right ankle, initial encounter: Secondary | ICD-10-CM | POA: Diagnosis not present

## 2020-09-06 DIAGNOSIS — D510 Vitamin B12 deficiency anemia due to intrinsic factor deficiency: Secondary | ICD-10-CM | POA: Diagnosis not present

## 2020-09-14 DIAGNOSIS — M353 Polymyalgia rheumatica: Secondary | ICD-10-CM | POA: Diagnosis not present

## 2020-09-14 DIAGNOSIS — I10 Essential (primary) hypertension: Secondary | ICD-10-CM | POA: Diagnosis not present

## 2020-09-14 DIAGNOSIS — M810 Age-related osteoporosis without current pathological fracture: Secondary | ICD-10-CM | POA: Diagnosis not present

## 2020-09-14 DIAGNOSIS — S91001A Unspecified open wound, right ankle, initial encounter: Secondary | ICD-10-CM | POA: Diagnosis not present

## 2020-09-14 DIAGNOSIS — D510 Vitamin B12 deficiency anemia due to intrinsic factor deficiency: Secondary | ICD-10-CM | POA: Diagnosis not present

## 2020-09-14 DIAGNOSIS — N179 Acute kidney failure, unspecified: Secondary | ICD-10-CM | POA: Diagnosis not present

## 2020-09-17 DIAGNOSIS — M818 Other osteoporosis without current pathological fracture: Secondary | ICD-10-CM | POA: Diagnosis not present

## 2020-09-17 DIAGNOSIS — D510 Vitamin B12 deficiency anemia due to intrinsic factor deficiency: Secondary | ICD-10-CM | POA: Diagnosis not present

## 2020-09-17 DIAGNOSIS — E785 Hyperlipidemia, unspecified: Secondary | ICD-10-CM | POA: Diagnosis not present

## 2020-09-17 DIAGNOSIS — M353 Polymyalgia rheumatica: Secondary | ICD-10-CM | POA: Diagnosis not present

## 2020-09-17 DIAGNOSIS — E559 Vitamin D deficiency, unspecified: Secondary | ICD-10-CM | POA: Diagnosis not present

## 2020-09-17 DIAGNOSIS — J439 Emphysema, unspecified: Secondary | ICD-10-CM | POA: Diagnosis not present

## 2020-09-17 DIAGNOSIS — Z87891 Personal history of nicotine dependence: Secondary | ICD-10-CM | POA: Diagnosis not present

## 2020-09-17 DIAGNOSIS — N179 Acute kidney failure, unspecified: Secondary | ICD-10-CM | POA: Diagnosis not present

## 2020-09-21 ENCOUNTER — Inpatient Hospital Stay: Payer: Medicare Other | Admitting: Family Medicine

## 2020-09-21 DIAGNOSIS — M353 Polymyalgia rheumatica: Secondary | ICD-10-CM | POA: Diagnosis not present

## 2020-09-21 DIAGNOSIS — M818 Other osteoporosis without current pathological fracture: Secondary | ICD-10-CM | POA: Diagnosis not present

## 2020-09-21 DIAGNOSIS — Z87891 Personal history of nicotine dependence: Secondary | ICD-10-CM | POA: Diagnosis not present

## 2020-09-21 DIAGNOSIS — D510 Vitamin B12 deficiency anemia due to intrinsic factor deficiency: Secondary | ICD-10-CM | POA: Diagnosis not present

## 2020-09-21 DIAGNOSIS — E785 Hyperlipidemia, unspecified: Secondary | ICD-10-CM | POA: Diagnosis not present

## 2020-09-21 DIAGNOSIS — J439 Emphysema, unspecified: Secondary | ICD-10-CM | POA: Diagnosis not present

## 2020-09-21 DIAGNOSIS — N179 Acute kidney failure, unspecified: Secondary | ICD-10-CM | POA: Diagnosis not present

## 2020-09-21 DIAGNOSIS — E559 Vitamin D deficiency, unspecified: Secondary | ICD-10-CM | POA: Diagnosis not present

## 2020-09-22 DIAGNOSIS — M353 Polymyalgia rheumatica: Secondary | ICD-10-CM | POA: Diagnosis not present

## 2020-09-22 DIAGNOSIS — E559 Vitamin D deficiency, unspecified: Secondary | ICD-10-CM | POA: Diagnosis not present

## 2020-09-22 DIAGNOSIS — Z87891 Personal history of nicotine dependence: Secondary | ICD-10-CM | POA: Diagnosis not present

## 2020-09-22 DIAGNOSIS — N179 Acute kidney failure, unspecified: Secondary | ICD-10-CM | POA: Diagnosis not present

## 2020-09-22 DIAGNOSIS — E785 Hyperlipidemia, unspecified: Secondary | ICD-10-CM | POA: Diagnosis not present

## 2020-09-22 DIAGNOSIS — J439 Emphysema, unspecified: Secondary | ICD-10-CM | POA: Diagnosis not present

## 2020-09-22 DIAGNOSIS — D510 Vitamin B12 deficiency anemia due to intrinsic factor deficiency: Secondary | ICD-10-CM | POA: Diagnosis not present

## 2020-09-22 DIAGNOSIS — M818 Other osteoporosis without current pathological fracture: Secondary | ICD-10-CM | POA: Diagnosis not present

## 2020-09-23 DIAGNOSIS — D510 Vitamin B12 deficiency anemia due to intrinsic factor deficiency: Secondary | ICD-10-CM | POA: Diagnosis not present

## 2020-09-23 DIAGNOSIS — Z87891 Personal history of nicotine dependence: Secondary | ICD-10-CM | POA: Diagnosis not present

## 2020-09-23 DIAGNOSIS — M818 Other osteoporosis without current pathological fracture: Secondary | ICD-10-CM | POA: Diagnosis not present

## 2020-09-23 DIAGNOSIS — E785 Hyperlipidemia, unspecified: Secondary | ICD-10-CM | POA: Diagnosis not present

## 2020-09-23 DIAGNOSIS — J439 Emphysema, unspecified: Secondary | ICD-10-CM | POA: Diagnosis not present

## 2020-09-23 DIAGNOSIS — N179 Acute kidney failure, unspecified: Secondary | ICD-10-CM | POA: Diagnosis not present

## 2020-09-23 DIAGNOSIS — M353 Polymyalgia rheumatica: Secondary | ICD-10-CM | POA: Diagnosis not present

## 2020-09-23 DIAGNOSIS — E559 Vitamin D deficiency, unspecified: Secondary | ICD-10-CM | POA: Diagnosis not present

## 2020-09-27 DIAGNOSIS — D510 Vitamin B12 deficiency anemia due to intrinsic factor deficiency: Secondary | ICD-10-CM | POA: Diagnosis not present

## 2020-09-27 DIAGNOSIS — Z87891 Personal history of nicotine dependence: Secondary | ICD-10-CM | POA: Diagnosis not present

## 2020-09-27 DIAGNOSIS — M353 Polymyalgia rheumatica: Secondary | ICD-10-CM | POA: Diagnosis not present

## 2020-09-27 DIAGNOSIS — N179 Acute kidney failure, unspecified: Secondary | ICD-10-CM | POA: Diagnosis not present

## 2020-09-27 DIAGNOSIS — E785 Hyperlipidemia, unspecified: Secondary | ICD-10-CM | POA: Diagnosis not present

## 2020-09-27 DIAGNOSIS — E559 Vitamin D deficiency, unspecified: Secondary | ICD-10-CM | POA: Diagnosis not present

## 2020-09-27 DIAGNOSIS — M818 Other osteoporosis without current pathological fracture: Secondary | ICD-10-CM | POA: Diagnosis not present

## 2020-09-27 DIAGNOSIS — J439 Emphysema, unspecified: Secondary | ICD-10-CM | POA: Diagnosis not present

## 2020-10-03 DIAGNOSIS — M353 Polymyalgia rheumatica: Secondary | ICD-10-CM | POA: Diagnosis not present

## 2020-10-03 DIAGNOSIS — E785 Hyperlipidemia, unspecified: Secondary | ICD-10-CM | POA: Diagnosis not present

## 2020-10-03 DIAGNOSIS — D510 Vitamin B12 deficiency anemia due to intrinsic factor deficiency: Secondary | ICD-10-CM | POA: Diagnosis not present

## 2020-10-03 DIAGNOSIS — M818 Other osteoporosis without current pathological fracture: Secondary | ICD-10-CM | POA: Diagnosis not present

## 2020-10-03 DIAGNOSIS — E559 Vitamin D deficiency, unspecified: Secondary | ICD-10-CM | POA: Diagnosis not present

## 2020-10-03 DIAGNOSIS — J439 Emphysema, unspecified: Secondary | ICD-10-CM | POA: Diagnosis not present

## 2020-10-03 DIAGNOSIS — N179 Acute kidney failure, unspecified: Secondary | ICD-10-CM | POA: Diagnosis not present

## 2020-10-03 DIAGNOSIS — Z87891 Personal history of nicotine dependence: Secondary | ICD-10-CM | POA: Diagnosis not present

## 2020-10-05 ENCOUNTER — Ambulatory Visit: Payer: Medicare Other | Admitting: Family Medicine

## 2020-10-06 DIAGNOSIS — D510 Vitamin B12 deficiency anemia due to intrinsic factor deficiency: Secondary | ICD-10-CM | POA: Diagnosis not present

## 2020-10-06 DIAGNOSIS — N179 Acute kidney failure, unspecified: Secondary | ICD-10-CM | POA: Diagnosis not present

## 2020-10-06 DIAGNOSIS — M818 Other osteoporosis without current pathological fracture: Secondary | ICD-10-CM | POA: Diagnosis not present

## 2020-10-06 DIAGNOSIS — Z87891 Personal history of nicotine dependence: Secondary | ICD-10-CM | POA: Diagnosis not present

## 2020-10-06 DIAGNOSIS — E559 Vitamin D deficiency, unspecified: Secondary | ICD-10-CM | POA: Diagnosis not present

## 2020-10-06 DIAGNOSIS — E785 Hyperlipidemia, unspecified: Secondary | ICD-10-CM | POA: Diagnosis not present

## 2020-10-06 DIAGNOSIS — M353 Polymyalgia rheumatica: Secondary | ICD-10-CM | POA: Diagnosis not present

## 2020-10-06 DIAGNOSIS — J439 Emphysema, unspecified: Secondary | ICD-10-CM | POA: Diagnosis not present

## 2020-10-07 DIAGNOSIS — Z87891 Personal history of nicotine dependence: Secondary | ICD-10-CM | POA: Diagnosis not present

## 2020-10-07 DIAGNOSIS — M353 Polymyalgia rheumatica: Secondary | ICD-10-CM | POA: Diagnosis not present

## 2020-10-07 DIAGNOSIS — E785 Hyperlipidemia, unspecified: Secondary | ICD-10-CM | POA: Diagnosis not present

## 2020-10-07 DIAGNOSIS — N179 Acute kidney failure, unspecified: Secondary | ICD-10-CM | POA: Diagnosis not present

## 2020-10-07 DIAGNOSIS — M818 Other osteoporosis without current pathological fracture: Secondary | ICD-10-CM | POA: Diagnosis not present

## 2020-10-07 DIAGNOSIS — J439 Emphysema, unspecified: Secondary | ICD-10-CM | POA: Diagnosis not present

## 2020-10-07 DIAGNOSIS — E559 Vitamin D deficiency, unspecified: Secondary | ICD-10-CM | POA: Diagnosis not present

## 2020-10-07 DIAGNOSIS — D510 Vitamin B12 deficiency anemia due to intrinsic factor deficiency: Secondary | ICD-10-CM | POA: Diagnosis not present

## 2020-10-10 DIAGNOSIS — Z87891 Personal history of nicotine dependence: Secondary | ICD-10-CM | POA: Diagnosis not present

## 2020-10-10 DIAGNOSIS — M818 Other osteoporosis without current pathological fracture: Secondary | ICD-10-CM | POA: Diagnosis not present

## 2020-10-10 DIAGNOSIS — E785 Hyperlipidemia, unspecified: Secondary | ICD-10-CM | POA: Diagnosis not present

## 2020-10-10 DIAGNOSIS — M353 Polymyalgia rheumatica: Secondary | ICD-10-CM | POA: Diagnosis not present

## 2020-10-10 DIAGNOSIS — N179 Acute kidney failure, unspecified: Secondary | ICD-10-CM | POA: Diagnosis not present

## 2020-10-10 DIAGNOSIS — D510 Vitamin B12 deficiency anemia due to intrinsic factor deficiency: Secondary | ICD-10-CM | POA: Diagnosis not present

## 2020-10-10 DIAGNOSIS — J439 Emphysema, unspecified: Secondary | ICD-10-CM | POA: Diagnosis not present

## 2020-10-10 DIAGNOSIS — E559 Vitamin D deficiency, unspecified: Secondary | ICD-10-CM | POA: Diagnosis not present

## 2020-10-12 DIAGNOSIS — M818 Other osteoporosis without current pathological fracture: Secondary | ICD-10-CM | POA: Diagnosis not present

## 2020-10-12 DIAGNOSIS — E559 Vitamin D deficiency, unspecified: Secondary | ICD-10-CM | POA: Diagnosis not present

## 2020-10-12 DIAGNOSIS — D510 Vitamin B12 deficiency anemia due to intrinsic factor deficiency: Secondary | ICD-10-CM | POA: Diagnosis not present

## 2020-10-12 DIAGNOSIS — Z87891 Personal history of nicotine dependence: Secondary | ICD-10-CM | POA: Diagnosis not present

## 2020-10-12 DIAGNOSIS — E785 Hyperlipidemia, unspecified: Secondary | ICD-10-CM | POA: Diagnosis not present

## 2020-10-12 DIAGNOSIS — N179 Acute kidney failure, unspecified: Secondary | ICD-10-CM | POA: Diagnosis not present

## 2020-10-12 DIAGNOSIS — M353 Polymyalgia rheumatica: Secondary | ICD-10-CM | POA: Diagnosis not present

## 2020-10-12 DIAGNOSIS — J439 Emphysema, unspecified: Secondary | ICD-10-CM | POA: Diagnosis not present

## 2020-10-28 ENCOUNTER — Ambulatory Visit (INDEPENDENT_AMBULATORY_CARE_PROVIDER_SITE_OTHER): Payer: Medicare Other | Admitting: Family Medicine

## 2020-10-28 ENCOUNTER — Encounter: Payer: Self-pay | Admitting: Family Medicine

## 2020-10-28 ENCOUNTER — Other Ambulatory Visit: Payer: Self-pay

## 2020-10-28 VITALS — BP 160/58 | HR 89 | Temp 98.0°F | Ht 60.25 in | Wt 102.9 lb

## 2020-10-28 DIAGNOSIS — Z23 Encounter for immunization: Secondary | ICD-10-CM

## 2020-10-28 DIAGNOSIS — M810 Age-related osteoporosis without current pathological fracture: Secondary | ICD-10-CM | POA: Diagnosis not present

## 2020-10-28 DIAGNOSIS — I10 Essential (primary) hypertension: Secondary | ICD-10-CM

## 2020-10-28 NOTE — Progress Notes (Signed)
mm

## 2020-10-28 NOTE — Patient Instructions (Signed)
Work ok getting 1200mg  calcium daily - ok for all of this to come from foods that you are eating.  *keep up with good water intake.

## 2020-10-28 NOTE — Progress Notes (Signed)
[name] DOB: [birth year] Encounter date: 10/28/2020  This is a 87 y.o. female who presents with Chief Complaint  Patient presents with   Follow-up    Patient states the right leg wound is healing, scab came off 1 week ago    History of present illness: Acted differently after she started the bactrim - she had some confusion and ended up in ER.   She has been home on own for 2 weeks; was at daughters for 4 weeks prior to that. Was in assisted living and SNF prior to that. She has done all her own errands and activities. Has been to get her nails clipped. Pulled up flowers 2 days ago and again today. Feels well. Doing everything as she normally would. She doesn't remember a lot from her hospital stay. Had some confusion at SNF as well.   [place]n't drive on [address] and doesn't drive at night.   Scab came off in past week. Skin is fully healed. Lats time she had discomfort was 10/13/20 -throbbed some. Just made point to remember than.   Has been drinking about 30 oz water daily and she is making sure she is getting protein in day. Nutritionist in family told her to get 45g/day which she has been working to get. Eating fresh fruits, healthy overall.   She is taking calcium  Doesn't take a nap; keeps self busy doing things at home- stays cleaning, cooking. Takes benadryl for bedtime; snacks in evening when she is reading just prior to bed. Can't fall asleep without benadryl.takes just 25mg . Wakes to urinate 2-3 times.   Allergies  Allergen Reactions   Naproxen Other (See Comments)    Severe pain in joints and hands   Oxycodone-Acetaminophen Other (See Comments)    REACTION: gastric reaction   Bactrim [Sulfamethoxazole-Trimethoprim]     She had episode of confusion after starting bactrim; uncertain if related. Got dehydrated, ARF.    Aspirin Other (See Comments)    "threw up blood"   Codeine Other (See Comments)    "threw up blood"   Current Meds  Medication Sig    alendronate (FOSAMAX) 70 MG tablet TAKE 1 TABLET BY MOUTH  WEEKLY WITH 8 OZ OF PLAIN  WATER 30 MINUTES BEFORE  FIRST FOOD, DRINK OR MEDS.  STAY UPRIGHT FOR 30 MINS   Cyanocobalamin (VITAMIN B-12) 1000 MCG SUBL Place 1,000 mcg under the tongue daily.    diphenhydrAMINE (BENADRYL) 25 MG tablet Take 25 mg by mouth at bedtime as needed for sleep.   furosemide (LASIX) 20 MG tablet Take 1 tablet (20 mg total) by mouth daily as needed.   mupirocin ointment (BACTROBAN) 2 % Apply 1 application topically 2 (two) times daily.   Oyster Shell (OYSTER CALCIUM) 500 MG TABS tablet Take 1,000 mg of elemental calcium by mouth daily.   predniSONE (DELTASONE) 2.5 MG tablet TAKE 1 TABLET BY MOUTH  DAILY WITH BREAKFAST   vitamin C (ASCORBIC ACID) 250 MG tablet Take 500 mg by mouth daily.    Review of Systems  Constitutional:  Negative for chills, fatigue and fever.  Respiratory:  Negative for cough, chest tightness, shortness of breath and wheezing.   Cardiovascular:  Negative for chest pain, palpitations and leg swelling.  Genitourinary:  Negative for difficulty urinating and dysuria.  Musculoskeletal:  Negative for arthralgias and back pain.   Objective:  BP (!) 160/58 (BP Location: Left Arm, Patient Position: Sitting, Cuff Size: Normal)   Pulse 89   Temp 98 F (36.7  C) (Oral)   Ht 5' 0.25" (1.53 m)   Wt 102 lb 14.4 oz (46.7 kg)   SpO2 95%   BMI 19.93 kg/m   Weight: 102 lb 14.4 oz (46.7 kg)   BP Readings from Last 3 Encounters:  10/28/20 (!) 160/58  09/02/20 (!) 160/79  08/26/20 (!) 150/80   Wt Readings from Last 3 Encounters:  10/28/20 102 lb 14.4 oz (46.7 kg)  08/30/20 102 lb 4.7 oz (46.4 kg)  08/26/20 102 lb 6.4 oz (46.4 kg)    Physical Exam Constitutional:      General: She is not in acute distress.    Appearance: She is well-developed. She is not diaphoretic.  HENT:     Head: Normocephalic and atraumatic.     Right Ear: External ear normal.     Left Ear: External ear normal.  Eyes:      Conjunctiva/sclera: Conjunctivae normal.     Pupils: Pupils are equal, round, and reactive to light.  Neck:     Thyroid: No thyromegaly.  Cardiovascular:     Rate and Rhythm: Normal rate and regular rhythm.     Heart sounds: Normal heart sounds. No murmur heard.   No friction rub. No gallop.  Pulmonary:     Effort: Pulmonary effort is normal. No respiratory distress.     Breath sounds: Normal breath sounds. No wheezing or rales.  Musculoskeletal:     Cervical back: Neck supple.     Right lower leg: No edema.     Left lower leg: No edema.  Lymphadenopathy:     Cervical: No cervical adenopathy.  Skin:    General: Skin is warm and dry.  Neurological:     Mental Status: She is alert and oriented to person, place, and time.     Cranial Nerves: No cranial nerve deficit.     Motor: No abnormal muscle tone.     Deep Tendon Reflexes: Reflexes normal.     Reflex Scores:      Tricep reflexes are 2+ on the right side and 2+ on the left side.      Bicep reflexes are 2+ on the right side and 2+ on the left side.      Brachioradialis reflexes are 2+ on the right side and 2+ on the left side.      Patellar reflexes are 2+ on the right side and 2+ on the left side. Psychiatric:        Behavior: Behavior normal.    Assessment/Plan She looks great today. Her energy is back and she has been very independent in last couple of weeks. We will recheck bloodwork to make sure stable since hospitalization.   1. Need for immunization against influenza - Flu Vaccine QUAD High Dose(Fluad)  2. Essential hypertension Low diastolic limits systolic lowering. Uses lasix prn for edema. Would not further decrease systolic bp at this time, but continue to monitor.  - CBC with Differential/Platelet; Future - Comprehensive metabolic panel; Future  3. Need for prophylactic vaccination against Streptococcus pneumoniae (pneumococcus) - Pneumococcal conjugate vaccine 20-valent (Prevnar 20)   4.  Osteoporosis Continue with fosamax 70mg  weekly. She is taking calcium and is quite active, keeping up with all of household chores and errands.  Return for bloodwork at convenience. follow up with me in 6 months. 42 minutes spent with patient- chart review, exam, charting, discussion of hospital stay/followup care.   , MD

## 2020-10-31 ENCOUNTER — Other Ambulatory Visit: Payer: Self-pay

## 2020-10-31 ENCOUNTER — Other Ambulatory Visit (INDEPENDENT_AMBULATORY_CARE_PROVIDER_SITE_OTHER): Payer: Medicare Other

## 2020-10-31 DIAGNOSIS — I10 Essential (primary) hypertension: Secondary | ICD-10-CM

## 2020-10-31 LAB — CBC WITH DIFFERENTIAL/PLATELET
Basophils Absolute: 0 10*3/uL (ref 0.0–0.1)
Basophils Relative: 0.3 % (ref 0.0–3.0)
Eosinophils Absolute: 0 10*3/uL (ref 0.0–0.7)
Eosinophils Relative: 0.3 % (ref 0.0–5.0)
HCT: 38.1 % (ref 36.0–46.0)
Hemoglobin: 12.8 g/dL (ref 12.0–15.0)
Lymphocytes Relative: 13.3 % (ref 12.0–46.0)
Lymphs Abs: 1 10*3/uL (ref 0.7–4.0)
MCHC: 33.4 g/dL (ref 30.0–36.0)
MCV: 97.5 fl (ref 78.0–100.0)
Monocytes Absolute: 0.5 10*3/uL (ref 0.1–1.0)
Monocytes Relative: 6.9 % (ref 3.0–12.0)
Neutro Abs: 5.7 10*3/uL (ref 1.4–7.7)
Neutrophils Relative %: 79.2 % — ABNORMAL HIGH (ref 43.0–77.0)
Platelets: 232 10*3/uL (ref 150.0–400.0)
RBC: 3.91 Mil/uL (ref 3.87–5.11)
RDW: 13.4 % (ref 11.5–15.5)
WBC: 7.2 10*3/uL (ref 4.0–10.5)

## 2020-10-31 LAB — COMPREHENSIVE METABOLIC PANEL
ALT: 11 U/L (ref 0–35)
AST: 21 U/L (ref 0–37)
Albumin: 3.8 g/dL (ref 3.5–5.2)
Alkaline Phosphatase: 55 U/L (ref 39–117)
BUN: 16 mg/dL (ref 6–23)
CO2: 34 mEq/L — ABNORMAL HIGH (ref 19–32)
Calcium: 10.1 mg/dL (ref 8.4–10.5)
Chloride: 102 mEq/L (ref 96–112)
Creatinine, Ser: 1.17 mg/dL (ref 0.40–1.20)
GFR: 41.07 mL/min — ABNORMAL LOW (ref >60.00)
Glucose, Bld: 112 mg/dL — ABNORMAL HIGH (ref 70–99)
Potassium: 5 mEq/L (ref 3.5–5.1)
Sodium: 142 mEq/L (ref 135–145)
Total Bilirubin: 0.4 mg/dL (ref 0.2–1.2)
Total Protein: 6.7 g/dL (ref 6.0–8.3)

## 2020-11-07 ENCOUNTER — Encounter: Payer: Self-pay | Admitting: Internal Medicine

## 2020-11-07 ENCOUNTER — Ambulatory Visit: Payer: Medicare Other | Admitting: Internal Medicine

## 2020-11-07 ENCOUNTER — Ambulatory Visit (INDEPENDENT_AMBULATORY_CARE_PROVIDER_SITE_OTHER): Payer: Medicare Other

## 2020-11-07 ENCOUNTER — Other Ambulatory Visit: Payer: Self-pay

## 2020-11-07 VITALS — BP 162/64 | HR 70 | Ht 60.25 in | Wt 103.0 lb

## 2020-11-07 DIAGNOSIS — R002 Palpitations: Secondary | ICD-10-CM

## 2020-11-07 NOTE — Progress Notes (Unsigned)
Patient enrolled for [employer] to mail a 2-3 day ZIO XT long term holter monitor to her home.

## 2020-11-07 NOTE — Patient Instructions (Addendum)
Medication Instructions:  Your physician recommends that you continue on your current medications as directed. Please refer to the Current Medication list given to you today. none *If you need a refill on your cardiac medications before your next appointment, please call your pharmacy*   Lab Work: none If you have labs (blood work) drawn today and your tests are completely normal, you will receive your results only by: MyChart Message (if you have MyChart) OR A paper copy in the mail If you have any lab test that is abnormal or we need to change your treatment, we will call you to review the results.   Testing/Procedures: Christena Deem- Long Term Monitor Instructions  Your physician has requested you wear a ZIO patch monitor for 2 days.  This is a single patch monitor. [employer] supplies one patch monitor per enrollment. Additional stickers are not available. Please do not apply patch if you will be having a Nuclear Stress Test,  Echocardiogram, Cardiac CT, MRI, or Chest Xray during the period you would be wearing the  monitor. The patch cannot be worn during these tests. You cannot remove and re-apply the  ZIO XT patch monitor.  Your ZIO patch monitor will be mailed 3 day USPS to your address on file. It may take 3-5 days  to receive your monitor after you have been enrolled.  Once you have received your monitor, please review the enclosed instructions. Your monitor  has already been registered assigning a specific monitor serial # to you.  Billing and Patient Assistance Program Information  We have supplied [employer] with any of your insurance information on file for billing purposes. [employer] offers a sliding scale Patient Assistance Program for patients that do not have  insurance, or whose insurance does not completely cover the cost of the ZIO monitor.  You must apply for the Patient Assistance Program to qualify for this discounted rate.  To apply, please call [employer] at [phone],  select option 4, select option 2, ask to apply for  Patient Assistance Program. Meredeth Ide will ask your household income, and how many people  are in your household. They will quote your out-of-pocket cost based on that information.  [employer] will also be able to set up a 1-month, interest-free payment plan if needed.  Applying the monitor   Shave hair from upper left chest.  Hold abrader disc by orange tab. Rub abrader in 40 strokes over the upper left chest as  indicated in your monitor instructions.  Clean area with 4 enclosed alcohol pads. Let dry.  Apply patch as indicated in monitor instructions. Patch will be placed under collarbone on left  side of chest with arrow pointing upward.  Rub patch adhesive wings for 2 minutes. Remove white label marked "1". Remove the white  label marked "2". Rub patch adhesive wings for 2 additional minutes.  While looking in a mirror, press and release button in center of patch. A small green light will  flash 3-4 times. This will be your only indicator that the monitor has been turned on.  Do not shower for the first 24 hours. You may shower after the first 24 hours.  Press the button if you feel a symptom. You will hear a small click. Record Date, Time and  Symptom in the Patient Logbook.  When you are ready to remove the patch, follow instructions on the last 2 pages of Patient  Logbook. Stick patch monitor onto the last page of Patient Logbook.  Place Patient Logbook in  the blue and white box. Use locking tab on box and tape box closed  securely. The blue and white box has prepaid postage on it. Please place it in the mailbox as  soon as possible. Your physician should have your test results approximately 7 days after the  monitor has been mailed back to [facility].  Call [facility] Customer Care at [phone] if you have questions regarding  your ZIO XT patch monitor. Call them immediately if you see an orange light blinking on your   monitor.  If your monitor falls off in less than 4 days, contact our Monitor department at [phone].  If your monitor becomes loose or falls off after 4 days call [employer] at [phone] for  suggestions on securing your monitor   Follow-Up: At [facility], you and your health needs are our priority.  As part of our continuing mission to provide you with exceptional heart care, we have created designated Provider Care Teams.  These Care Teams include your primary Cardiologist (physician) and Advanced Practice Providers (APPs -  Physician Assistants and Nurse Practitioners) who all work together to provide you with the care you need, when you need it.  We recommend signing up for the patient portal called "MyChart".  Sign up information is provided on this After Visit Summary.  MyChart is used to connect with patients for Virtual Visits (Telemedicine).  Patients are able to view lab/test results, encounter notes, upcoming appointments, etc.  Non-urgent messages can be sent to your provider as well.   To learn more about what you can do with MyChart, go to [email or web address].    Your next appointment: As needed

## 2020-11-07 NOTE — Progress Notes (Signed)
Cardiology Office Note   Date:  11/07/2020   ID:  SAHIRAH RUDELL, DOB Apr 02, 1928, MRN [id number]  PCP:  [name], MD  Cardiologist:   Dietrich Pates, MD   Pt referred for irregular HR by shannon banks      History of Present Illness: [name] is a 87 y.o. female with a history of PMR, pernicious anemeia  Hosp in Aug for AKI   [name] pt denies palpitaions, no dizziness   She is here with her daughter    Daughter says she is pushing 50 g protein per day  to help with wOund healing       [name] pt denies CP  Says her breathing is OK  BP at other times 120s to 140s , occasional 150    Today is high for her      Getting bP cuff    Current Meds  Medication Sig   alendronate (FOSAMAX) 70 MG tablet TAKE 1 TABLET BY MOUTH  WEEKLY WITH 8 OZ OF PLAIN  WATER 30 MINUTES BEFORE  FIRST FOOD, DRINK OR MEDS.  STAY UPRIGHT FOR 30 MINS   CALCIUM PO Take 1,300 mg by mouth daily.   Cyanocobalamin (VITAMIN B-12) 1000 MCG SUBL Place 1,000 mcg under [name] tongue daily.    diphenhydrAMINE (BENADRYL) 25 MG tablet Take 25 mg by mouth at bedtime as needed for sleep.   furosemide (LASIX) 20 MG tablet Take 1 tablet (20 mg total) by mouth daily as needed.   mupirocin ointment (BACTROBAN) 2 % Apply 1 application topically 2 (two) times daily.   predniSONE (DELTASONE) 2.5 MG tablet TAKE 1 TABLET BY MOUTH  DAILY WITH BREAKFAST   vitamin C (ASCORBIC ACID) 250 MG tablet Take 500 mg by mouth daily.     Allergies:   Naproxen, Oxycodone-acetaminophen, Bactrim [sulfamethoxazole-trimethoprim], Aspirin, and Codeine   Past Medical History:  Diagnosis Date   Alcohol abuse    Emphysema of lung (HCC) 01/26/2009   pt. unsure of this reports as of 07/31/2012   Fracture of fifth metatarsal bone of right foot 08/29/2012   Gout 08/11/2012   Hyperlipemia 03/27/2007   Hypertension    Osteoporosis 07/12/2006   Pernicious anemia 10/25/2006   Polymyalgia rheumatica (HCC) 03/27/2007   sees Dr. Kellie Simmering in rheumatology    Vitamin D deficiency 07/12/2006    Past Surgical History:  Procedure Laterality Date   APPENDECTOMY  07/12/2006   LUMBAR LAMINECTOMY/DECOMPRESSION MICRODISCECTOMY Right 02/13/2017   Procedure: Microdiscectomy - right - Lumbar three-Lumbar four;  Surgeon: Tia Alert, MD;  Location: [facility] OR;  Service: Neurosurgery;  Laterality: Right;   PARTIAL HIP ARTHROPLASTY     left side- 2x's ( 2nd surgery to correct 1st replacement), R side -    TOOTH EXTRACTION N/A 08/04/2012   Procedure: EXTRACTIONS 17, 20;  Surgeon: Georgia Lopes, DDS;  Location: [facility] OR;  Service: Oral Surgery;  Laterality: N/A;     Social History:  [name]  reports that she quit smoking about 14 years ago. Her smoking use included cigarettes. She started smoking about 72 years ago. She has a 116.00 pack-year smoking history. She has never used smokeless tobacco. She reports that she does not drink alcohol and does not use drugs.   Family History:  [name]'s family history includes Cancer in her father; Heart disease in her father; Mental illness in her mother; Ulcers in her father.    ROS:  Please see [name] history of present illness. All other systems  are reviewed and  Negative to [name] above problem except as noted.    PHYSICAL EXAM: VS:  BP (!) 162/64   Pulse 70   Ht 5' 0.25" (1.53 m)   Wt 103 lb (46.7 kg)   SpO2 92%   BMI 19.95 kg/m   GEN: Thin   in no acute distress  HEENT: normal  Neck: no JVD, carotid bruits, or masses Cardiac:   RRR with frequent skips   No murmurs   Respiratory:  clear to auscultation bilaterally, GI: soft, nontender, nondistended, + BS  No hepatomegaly  MS: no deformity Moving all extremities   Skin: warm and dry, no rash Neuro:  Strength and sensation are intact Psych: euthymic mood, full affect   EKG:  EKG is ordered today.  SR   PACs   70 bpm    Lipid Panel    Component Value Date/Time   CHOL 188 12/07/2019 1327   TRIG 107 12/07/2019 1327   HDL 88 12/07/2019 1327    CHOLHDL 2.1 12/07/2019 1327   VLDL 33.6 07/21/2015 0900   LDLCALC 80 12/07/2019 1327   LDLDIRECT 115.5 09/08/2012 1120      Wt Readings from Last 3 Encounters:  11/07/20 103 lb (46.7 kg)  10/28/20 102 lb 14.4 oz (46.7 kg)  08/30/20 102 lb 4.7 oz (46.4 kg)      ASSESSMENT AND PLAN:  1  Irreg heart beat   Pt is asymptomatic  Irreg heart rhythm on exam    I would recomm 48 hour Zio patch to eval burdent, r/o other arrhythmias  2  HTN  BP is high today and on a couple other visits   She needs to get cuff to follow at home    I would not change meds for now        Current medicines are reviewed at length with [name] today.  [name] does not have concerns regarding medicines.  Signed, Dietrich Pates, MD  11/07/2020 10:55 AM    [facility] [address] [place], [place], [place]  [address] Phone: [phone]; Fax: [phone]

## 2020-11-11 ENCOUNTER — Other Ambulatory Visit: Payer: Medicare Other

## 2020-11-12 DIAGNOSIS — R002 Palpitations: Secondary | ICD-10-CM | POA: Diagnosis not present

## 2020-11-21 DIAGNOSIS — R002 Palpitations: Secondary | ICD-10-CM | POA: Diagnosis not present

## 2020-12-23 ENCOUNTER — Telehealth: Payer: Self-pay

## 2020-12-23 NOTE — Telephone Encounter (Signed)
Spoke with the pts daughter [name] to follow up re: Dr. Tenny Craw' recommendation to take Eliquis and she had mentioned to another nurse that she was going to "consider" it and talk with the pt.... she reports that she has been over the risks of not taking the Eliquis with her mom and she declines taking it... she says that have talked about it and she will not take it.   She says the pt has not been c/o palpitations and has been feeling generally well.   They do not have planned follow up and says she is not sure that she needs it.   I will forward to Dr. Tenny Craw for her review.

## 2021-04-14 ENCOUNTER — Telehealth: Payer: Self-pay | Admitting: Family Medicine

## 2021-04-14 NOTE — Telephone Encounter (Signed)
Patient daughter called in requesting a medication refill for furosemide (LASIX) 20 MG tablet [[id number]]  to be sent to her pharmacy. ? ?Pharmacy: [facility] ?phone number for rx: [phone] ? ?Please advise. ?

## 2021-04-17 MED ORDER — FUROSEMIDE 20 MG PO TABS: 20.0000 mg | ORAL_TABLET | Freq: Every day | ORAL | 0 refills | Status: DC | PRN

## 2021-04-17 NOTE — Telephone Encounter (Signed)
Rx done. 

## 2021-04-19 ENCOUNTER — Encounter: Payer: Self-pay | Admitting: Family Medicine

## 2021-04-19 ENCOUNTER — Ambulatory Visit (INDEPENDENT_AMBULATORY_CARE_PROVIDER_SITE_OTHER): Payer: Medicare Other | Admitting: Family Medicine

## 2021-04-19 ENCOUNTER — Ambulatory Visit (INDEPENDENT_AMBULATORY_CARE_PROVIDER_SITE_OTHER): Payer: Medicare Other

## 2021-04-19 VITALS — BP 120/70 | HR 88 | Temp 98.0°F | Resp 16 | Ht 60.25 in

## 2021-04-19 DIAGNOSIS — R0689 Other abnormalities of breathing: Secondary | ICD-10-CM

## 2021-04-19 DIAGNOSIS — R079 Chest pain, unspecified: Secondary | ICD-10-CM | POA: Diagnosis not present

## 2021-04-19 DIAGNOSIS — R410 Disorientation, unspecified: Secondary | ICD-10-CM

## 2021-04-19 DIAGNOSIS — R5383 Other fatigue: Secondary | ICD-10-CM | POA: Diagnosis not present

## 2021-04-19 DIAGNOSIS — M353 Polymyalgia rheumatica: Secondary | ICD-10-CM

## 2021-04-19 NOTE — Patient Instructions (Addendum)
A few things to remember from today's visit: ? ?Episode of confusion - Plan: DG Chest 2 View, Basic metabolic panel, CBC, Urinalysis with Culture Reflex ? ?Hypoventilation - Plan: DG Chest 2 View ? ?POLYMYALGIA RHEUMATICA - Plan: C-reactive protein, Sedimentation rate ? ?If you need refills please call your pharmacy. ?Do not use My Chart to request refills or for acute issues that need immediate attention. ? No significant abnormalities today. ?Monitor for new symptoms. ? ?Please be sure medication list is accurate. ?If a new problem present, please set up appointment sooner than planned today. ? ?

## 2021-04-19 NOTE — Progress Notes (Signed)
? ?ACUTE VISIT ?Chief Complaint  ?Patient presents with  ? Weakness  ?  And confusion in the night, having some achy bones & sinus pressure; no temperature.   ? ?HPI: ?[name] is a 88 y.o. female, who is here today with her daughter concerned about transient episode of confusion last night when she woke up feeling could and "shivery", she is not sure if she was dreaming. ?Yesterday she had a normal day, went to the grocery store and did some shopping and lifting but no more than usual. ? ?She has some body aches: Low back pain,thighs,knees,calves,hips,and shoulder pain. ?Denies hx of back pain. ? ?Decreased appetite. ?She performed a home COVID 19 test today and negative. ?Negative for fever,sore throat, cough,CP,palpitation,SOB, nausea,changes in bowel habits,or urinary symptoms. ?No sick contact or unusual activity. ?She has not taken OTC medications. ? ?She has not noted LE edema,erythema,or cyanosis. ?No hx of trauma. ?PMR on Prednisone 2.5 mg daily,her daughter was wondering if joint pain was an exacerbation. ? ?Lab Results  ?Component Value Date  ? ESRSEDRATE 10 08/31/2020  ? ?Lab Results  ?Component Value Date  ? CRP 1.3 05/03/2015  ? ?Review of Systems  ?Constitutional:  Positive for appetite change and fatigue. Negative for fever and unexpected weight change.  ?HENT:  Negative for mouth sores, nosebleeds and trouble swallowing.   ?Eyes:  Negative for redness and visual disturbance.  ?Respiratory:  Negative for chest tightness and wheezing.   ?Gastrointestinal:  Negative for abdominal pain and vomiting.  ?Genitourinary:  Negative for decreased urine volume, difficulty urinating, dysuria and hematuria.  ?Musculoskeletal:  Positive for arthralgias, back pain and gait problem.  ?Skin:  Negative for pallor and rash.  ?Neurological:  Negative for syncope and headaches.  ?Psychiatric/Behavioral:  Negative for hallucinations. The patient is not nervous/anxious.   ?Rest see pertinent positives and  negatives per HPI. ? ?Current Outpatient Medications on File Prior to Visit  ?Medication Sig Dispense Refill  ? alendronate (FOSAMAX) 70 MG tablet TAKE 1 TABLET BY MOUTH  WEEKLY WITH 8 OZ OF PLAIN  WATER 30 MINUTES BEFORE  FIRST FOOD, DRINK OR MEDS.  STAY UPRIGHT FOR 30 MINS 12 tablet 3  ? CALCIUM PO Take 1,300 mg by mouth daily.    ? Cyanocobalamin (VITAMIN B-12) 1000 MCG SUBL Place 1,000 mcg under the tongue daily.     ? diphenhydrAMINE (BENADRYL) 25 MG tablet Take 25 mg by mouth at bedtime as needed for sleep.    ? furosemide (LASIX) 20 MG tablet Take 1 tablet (20 mg total) by mouth daily as needed. 90 tablet 0  ? mupirocin ointment (BACTROBAN) 2 % Apply 1 application topically 2 (two) times daily. 22 g 0  ? Oyster Shell (OYSTER CALCIUM) 500 MG TABS tablet Take 1,000 mg of elemental calcium by mouth daily.    ? predniSONE (DELTASONE) 2.5 MG tablet TAKE 1 TABLET BY MOUTH  DAILY WITH BREAKFAST 90 tablet 3  ? vitamin C (ASCORBIC ACID) 250 MG tablet Take 500 mg by mouth daily.    ? ?No current facility-administered medications on file prior to visit.  ? ?Past Medical History:  ?Diagnosis Date  ? Alcohol abuse   ? Emphysema of lung (HCC) 01/26/2009  ? pt. unsure of this reports as of 07/31/2012  ? Fracture of fifth metatarsal bone of right foot 08/29/2012  ? Gout 08/11/2012  ? Hyperlipemia 03/27/2007  ? Hypertension   ? Osteoporosis 07/12/2006  ? Pernicious anemia 10/25/2006  ? Polymyalgia rheumatica (HCC) 03/27/2007  ?  sees Dr. Kellie Simmering in rheumatology  ? Vitamin D deficiency 07/12/2006  ? ?Allergies  ?Allergen Reactions  ? Naproxen Other (See Comments)  ?  Severe pain in joints and hands  ? Oxycodone-Acetaminophen Other (See Comments)  ?  REACTION: gastric reaction  ? Bactrim [Sulfamethoxazole-Trimethoprim]   ?  She had episode of confusion after starting bactrim; uncertain if related. Got dehydrated, ARF.   ? Aspirin Other (See Comments)  ?  "threw up blood"  ? Codeine Other (See Comments)  ?  "threw up blood"  ? ? ?Social  History  ? ?Socioeconomic History  ? Marital status: Married  ?  Spouse name: Not on file  ? Number of children: 2  ? Years of education: Not on file  ? Highest education level: Not on file  ?Occupational History  ? Not on file  ?Tobacco Use  ? Smoking status: Former  ?  Packs/day: 2.00  ?  Years: 58.00  ?  Pack years: 116.00  ?  Types: Cigarettes  ?  Start date: 01/23/1948  ?  Quit date: 07/12/2006  ?  Years since quitting: 14.7  ? Smokeless tobacco: Never  ?Vaping Use  ? Vaping Use: Never used  ?Substance and Sexual Activity  ? Alcohol use: No  ?  Alcohol/week: 10.0 standard drinks  ?  Types: 10 Shots of liquor per week  ?  Comment: quit completely - 05/30/10  ? Drug use: No  ? Sexual activity: Never  ?Other Topics Concern  ? Not on file  ?Social History Narrative  ? Work or School: none  ?   ? Home Situation: lives with husband - drives, husband takes care of finances, dresses herself, takes care of housework and cooking  ?   ? Spiritual Beliefs: [employer]  ?   ? Lifestyle: uses cane when goes out, exercises sometimes - diet healthy  ?   ? 03/20/2018: Lives alone in one-level townhouse with dog. Husband passed away in fall of 29-May-2017. Coping well overall.  ? Has two daughters, both of whom live close by and are supportive.  ? Active in church  ? Enjoys gardening  ?   ?   ? ?Social Determinants of Health  ? ?Financial Resource Strain: Low Risk   ? Difficulty of Paying Living Expenses: Not hard at all  ?Food Insecurity: No Food Insecurity  ? Worried About Programme researcher, broadcasting/film/video in the Last Year: Never true  ? Ran Out of Food in the Last Year: Never true  ?Transportation Needs: No Transportation Needs  ? Lack of Transportation (Medical): No  ? Lack of Transportation (Non-Medical): No  ?Physical Activity: Inactive  ? Days of Exercise per Week: 0 days  ? Minutes of Exercise per Session: 0 min  ?Stress: No Stress Concern Present  ? Feeling of Stress : Not at all  ?Social Connections: Moderately Isolated  ? Frequency of  Communication with Friends and Family: Three times a week  ? Frequency of Social Gatherings with Friends and Family: Three times a week  ? Attends Religious Services: More than 4 times per year  ? Active Member of Clubs or Organizations: No  ? Attends Banker Meetings: Never  ? Marital Status: Widowed  ? ?Vitals:  ? 04/19/21 1500  ?BP: 120/70  ?Pulse: 88  ?Resp: 16  ?Temp: 98 ?F (36.7 ?C)  ?SpO2: 97%  ? ?Body mass index is 19.95 kg/m?. ? ?Physical Exam ?Vitals and nursing note reviewed.  ?Constitutional:   ?  General: She is not in acute distress. ?   Appearance: She is well-developed.  ?HENT:  ?   Head: Normocephalic and atraumatic.  ?   Mouth/Throat:  ?   Mouth: Mucous membranes are moist.  ?   Pharynx: Oropharynx is clear.  ?Eyes:  ?   Conjunctiva/sclera: Conjunctivae normal.  ?Cardiovascular:  ?   Rate and Rhythm: Normal rate. Rhythm irregular. Occasional Extrasystoles are present. ?   Heart sounds: No murmur heard. ?Pulmonary:  ?   Effort: Pulmonary effort is normal. No respiratory distress.  ?   Breath sounds: Decreased air movement present. Rales (? fine left basilar) present. No wheezing or rhonchi.  ?Abdominal:  ?   Palpations: Abdomen is soft. There is no hepatomegaly or mass.  ?   Tenderness: There is no abdominal tenderness.  ?Musculoskeletal:  ?   Lumbar back: No tenderness.  ?   Right lower leg: No edema.  ?   Left lower leg: No edema.  ?Lymphadenopathy:  ?   Cervical: No cervical adenopathy.  ?Skin: ?   General: Skin is warm.  ?   Findings: No erythema or rash.  ?Neurological:  ?   Mental Status: She is alert and oriented to person, place, and time.  ?   Gait: Gait normal.  ?   Comments: Antalgic gait assisted by a cane.  ?Psychiatric:     ?   Mood and Affect: Mood and affect normal.  ?   Comments: Well groomed, good eye contact.  ? ?ASSESSMENT AND PLAN: ? ?[name] was seen today for weakness. ? ?Diagnoses and all orders for this visit: ?Orders Placed This Encounter  ?Procedures  ? DG  Chest 2 View  ? Basic metabolic panel  ? CBC  ? Urinalysis with Culture Reflex  ? C-reactive protein  ? Sedimentation rate  ? ?Lab Results  ?Component Value Date  ? ESRSEDRATE 29 04/19/2021  ? ?Lab Results  ?Comp

## 2021-04-20 LAB — CBC
HCT: 42.5 % (ref 36.0–46.0)
Hemoglobin: 14.2 g/dL (ref 12.0–15.0)
MCHC: 33.4 g/dL (ref 30.0–36.0)
MCV: 97.5 fl (ref 78.0–100.0)
Platelets: 213 10*3/uL (ref 150.0–400.0)
RBC: 4.35 Mil/uL (ref 3.87–5.11)
RDW: 14.3 % (ref 11.5–15.5)
WBC: 19.1 10*3/uL (ref 4.0–10.5)

## 2021-04-20 LAB — BASIC METABOLIC PANEL
BUN: 15 mg/dL (ref 6–23)
CO2: 24 mEq/L (ref 19–32)
Calcium: 8.7 mg/dL (ref 8.4–10.5)
Chloride: 99 mEq/L (ref 96–112)
Creatinine, Ser: 0.96 mg/dL (ref 0.40–1.20)
GFR: 51.9 mL/min — ABNORMAL LOW (ref >60.00)
Glucose, Bld: 107 mg/dL — ABNORMAL HIGH (ref 70–99)
Potassium: 3.6 mEq/L (ref 3.5–5.1)
Sodium: 137 mEq/L (ref 135–145)

## 2021-04-20 LAB — URINALYSIS W MICROSCOPIC + REFLEX CULTURE

## 2021-04-20 LAB — C-REACTIVE PROTEIN: CRP: 12.6 mg/dL (ref 0.5–20.0)

## 2021-04-20 LAB — SEDIMENTATION RATE: Sed Rate: 29 mm/hr (ref 0–30)

## 2021-04-20 MED ORDER — AMOXICILLIN-POT CLAVULANATE 875-125 MG PO TABS: 1.0000 | ORAL_TABLET | Freq: Two times a day (BID) | ORAL | 0 refills | Status: AC

## 2021-04-20 MED ORDER — DOXYCYCLINE HYCLATE 100 MG PO TABS: 100.0000 mg | ORAL_TABLET | Freq: Two times a day (BID) | ORAL | 0 refills | Status: AC

## 2021-04-21 ENCOUNTER — Other Ambulatory Visit: Payer: Self-pay

## 2021-04-21 DIAGNOSIS — D72829 Elevated white blood cell count, unspecified: Secondary | ICD-10-CM

## 2021-04-21 DIAGNOSIS — J9811 Atelectasis: Secondary | ICD-10-CM

## 2021-04-24 ENCOUNTER — Other Ambulatory Visit (INDEPENDENT_AMBULATORY_CARE_PROVIDER_SITE_OTHER): Payer: Medicare Other

## 2021-04-24 DIAGNOSIS — D72829 Elevated white blood cell count, unspecified: Secondary | ICD-10-CM | POA: Diagnosis not present

## 2021-04-24 LAB — CBC WITH DIFFERENTIAL/PLATELET
Basophils Absolute: 0 10*3/uL (ref 0.0–0.1)
Basophils Relative: 0.4 % (ref 0.0–3.0)
Eosinophils Absolute: 0.1 10*3/uL (ref 0.0–0.7)
Eosinophils Relative: 1 % (ref 0.0–5.0)
HCT: 41.5 % (ref 36.0–46.0)
Hemoglobin: 14 g/dL (ref 12.0–15.0)
Lymphocytes Relative: 10.4 % — ABNORMAL LOW (ref 12.0–46.0)
Lymphs Abs: 1 10*3/uL (ref 0.7–4.0)
MCHC: 33.6 g/dL (ref 30.0–36.0)
MCV: 97.2 fl (ref 78.0–100.0)
Monocytes Absolute: 0.7 10*3/uL (ref 0.1–1.0)
Monocytes Relative: 7.3 % (ref 3.0–12.0)
Neutro Abs: 8.1 10*3/uL — ABNORMAL HIGH (ref 1.4–7.7)
Neutrophils Relative %: 80.9 % — ABNORMAL HIGH (ref 43.0–77.0)
Platelets: 255 10*3/uL (ref 150.0–400.0)
RBC: 4.27 Mil/uL (ref 3.87–5.11)
RDW: 13.4 % (ref 11.5–15.5)
WBC: 10.1 10*3/uL (ref 4.0–10.5)

## 2021-05-12 ENCOUNTER — Ambulatory Visit (INDEPENDENT_AMBULATORY_CARE_PROVIDER_SITE_OTHER): Payer: Medicare Other | Admitting: Family Medicine

## 2021-05-12 ENCOUNTER — Encounter: Payer: Self-pay | Admitting: Family Medicine

## 2021-05-12 VITALS — BP 138/60 | HR 55 | Temp 97.9°F | Ht 60.25 in | Wt 104.7 lb

## 2021-05-12 DIAGNOSIS — M353 Polymyalgia rheumatica: Secondary | ICD-10-CM

## 2021-05-12 DIAGNOSIS — I10 Essential (primary) hypertension: Secondary | ICD-10-CM | POA: Diagnosis not present

## 2021-05-12 DIAGNOSIS — I872 Venous insufficiency (chronic) (peripheral): Secondary | ICD-10-CM | POA: Diagnosis not present

## 2021-05-12 DIAGNOSIS — E538 Deficiency of other specified B group vitamins: Secondary | ICD-10-CM | POA: Diagnosis not present

## 2021-05-12 MED ORDER — FUROSEMIDE 20 MG PO TABS: 20.0000 mg | ORAL_TABLET | Freq: Every day | ORAL | 1 refills | Status: DC | PRN

## 2021-05-12 NOTE — Progress Notes (Signed)
?[name] ?DOB: 29-Nov-1928 ?Encounter date: 05/12/2021 ? ?This is a 88 y.o. female who presents with ?Chief Complaint  ?Patient presents with  ? Follow-up  ? ? ?History of present illness: ?Patient saw Dr. [place] on 04/19/2021 for weakness and some nighttime confusion.  This had resolved by the time patient was seen in the office. Daughter has blood clot in leg; difficulty with keeping meds in order. Jaslynn is still not sure what was wrong with her. She has really been intolerant to antibiotics as she is aging. She did complete antibiotic course.  ? ?Last visit with me was 10/28/2020.  She saw cardiology shortly after that visit and had monitor which revealed atrial fibrillation.  It was suggested to consider anticoagulation if benefits outweighed risk, but due to increased fall risk, chronic prednisone, this was not started. ? ?Getting up 2-3 times/night to go to the bathroom. Worries about falling at night. Didn't have enough urine for culture at last visit. Lower back back was hurting and is better now.  ? ?Having to use chapstick several times/day. In past this has indicated b12 def for her. She takes daily. She would like this to be checked with her future bloodwork.  ? ?Knees and hips are stiff when she gets up. Onceshe starts moving she does ok. Tries to use cane regularly to give her better balance. Occasionally doesn't use inside, but does use regularly at night. ? ? ?Allergies  ?Allergen Reactions  ? Naproxen Other (See Comments)  ?  Severe pain in joints and hands  ? Oxycodone-Acetaminophen Other (See Comments)  ?  REACTION: gastric reaction  ? Bactrim [Sulfamethoxazole-Trimethoprim]   ?  She had episode of confusion after starting bactrim; uncertain if related. Got dehydrated, ARF.   ? Doxycycline Other (See Comments)  ?  Burning sensation noted in the tongue and vaginal irritation  ? Aspirin Other (See Comments)  ?  "threw up blood"  ? Codeine Other (See Comments)  ?  "threw up blood"   ? ?Current Meds  ?Medication Sig  ? alendronate (FOSAMAX) 70 MG tablet TAKE 1 TABLET BY MOUTH  WEEKLY WITH 8 OZ OF PLAIN  WATER 30 MINUTES BEFORE  FIRST FOOD, DRINK OR MEDS.  STAY UPRIGHT FOR 30 MINS  ? CALCIUM PO Take 1,300 mg by mouth daily.  ? Cyanocobalamin (VITAMIN B-12) 1000 MCG SUBL Place 1,000 mcg under the tongue daily.   ? diphenhydrAMINE (BENADRYL) 25 MG tablet Take 25 mg by mouth at bedtime as needed for sleep.  ? furosemide (LASIX) 20 MG tablet Take 1 tablet (20 mg total) by mouth daily as needed.  ? Oyster Shell (OYSTER CALCIUM) 500 MG TABS tablet Take 1,000 mg of elemental calcium by mouth daily.  ? predniSONE (DELTASONE) 2.5 MG tablet TAKE 1 TABLET BY MOUTH  DAILY WITH BREAKFAST  ? vitamin C (ASCORBIC ACID) 250 MG tablet Take 500 mg by mouth daily.  ? [DISCONTINUED] mupirocin ointment (BACTROBAN) 2 % Apply 1 application topically 2 (two) times daily.  ? ? ?Review of Systems  ?Constitutional:  Negative for chills, fatigue and fever.  ?Respiratory:  Negative for cough, chest tightness, shortness of breath and wheezing.   ?Cardiovascular:  Negative for chest pain, palpitations and leg swelling.  ? ?Objective: ? ?BP 138/60 (BP Location: Left Arm, Patient Position: Sitting, Cuff Size: Normal)   Pulse (!) 55   Temp 97.9 ?F (36.6 ?C) (Oral)   Ht 5' 0.25" (1.53 m)   Wt 104 lb 11.2 oz (47.5 kg)  SpO2 98%   BMI 20.28 kg/m?   Weight: 104 lb 11.2 oz (47.5 kg)  ? ?BP Readings from Last 3 Encounters:  ?05/12/21 138/60  ?04/19/21 120/70  ?11/07/20 (!) 162/64  ? ?Wt Readings from Last 3 Encounters:  ?05/12/21 104 lb 11.2 oz (47.5 kg)  ?11/07/20 103 lb (46.7 kg)  ?10/28/20 102 lb 14.4 oz (46.7 kg)  ? ? ?Physical Exam ?Constitutional:   ?   General: She is not in acute distress. ?   Appearance: She is well-developed.  ?Cardiovascular:  ?   Rate and Rhythm: Normal rate and regular rhythm.  ?   Heart sounds: Normal heart sounds. No murmur heard. ?  No friction rub.  ?Pulmonary:  ?   Effort: Pulmonary effort is  normal. No respiratory distress.  ?   Breath sounds: Normal breath sounds. No wheezing or rales.  ?Musculoskeletal:  ?   Right lower leg: No edema.  ?   Left lower leg: No edema.  ?Neurological:  ?   Mental Status: She is alert and oriented to person, place, and time.  ?Psychiatric:     ?   Behavior: Behavior normal.  ? ? ?Assessment/Plan ?1. Essential hypertension ?Well-controlled without medication.  Occasional use of Lasix if lower extremity swelling.  Generally lower extremity swelling has been well controlled lately. ? ?2. POLYMYALGIA RHEUMATICA ?She continues on prednisone 2.5 mg.  This does a good job keeping her inflammation down.  Once she is up and moving, she generally just okay for the day.  She is happy with her mobility. ? ?3. B12 deficiency ?She just recently had blood work, so would like to wait for more, but when she gets next blood work she would like her B12 checked.  She does work on Licensed conveyancer diet, although appetite is slightly decreased.  She does do meal replacements at least once a day. ? ?4. Venous stasis dermatitis of both lower extremities ?We discussed lower leg discoloration today.  Discussed controlling swelling to help keep this stable. ? ?Ms. Gilkey is really doing well overall. She is very independent and physically is feeling well. She will be following up with Dr. Swaziland and has visit already scheduled. ? ? ? ?Return for keep establish care visit with Dr. Swaziland in May. ? ? ? ? ?Theodis Shove, MD ?

## 2021-05-19 ENCOUNTER — Other Ambulatory Visit: Payer: Medicare Other

## 2021-05-22 ENCOUNTER — Ambulatory Visit
Admission: RE | Admit: 2021-05-22 | Discharge: 2021-05-22 | Disposition: A | Discharge status: Home / Self Care | Payer: Medicare Other | Source: Ambulatory Visit | Attending: Family Medicine | Admitting: Family Medicine

## 2021-05-22 DIAGNOSIS — R911 Solitary pulmonary nodule: Secondary | ICD-10-CM | POA: Diagnosis not present

## 2021-05-22 DIAGNOSIS — J9811 Atelectasis: Secondary | ICD-10-CM | POA: Diagnosis not present

## 2021-05-22 DIAGNOSIS — J439 Emphysema, unspecified: Secondary | ICD-10-CM | POA: Diagnosis not present

## 2021-05-29 NOTE — Progress Notes (Signed)
? ? ?HPI: ?[name] is a 88 y.o. female, who is here today alone to establish care. ? ?Former PCP: Dr. Ethlyn Gallery ?Last preventive routine visit: 07/27/20 ? ?Chronic medical problems: Hearing loss,polymyalgia rheumatica, pernicious anemia, osteoporosis, hearing loss,hypertension, and vitamin D deficiency among some. ?Polymyalgia rheumatica currently on prednisone 2.5 mg daily.  ?Followed with rheumatologist years ago. ?States that she has had severe PMR symptoms, achy around shoulders,neck,and hips but inflammatory markers still in normal range. ?No joint edema or erythema. ? ?Insomnia:Sleeps about 8 hours, gets up to urinate q 2-3 hours. ?She takes Benadryl 25 mg before bedtime, recently decreased it to 1/2 tab, still helps. ? ?Hypertension on nonpharmacologic treatment. ?She has seen cardiologist, 11/07/2020, evaluated for palpitations. ?She denies any CP, palpitations,SOB,or diaphoresis. ?PAC's on EKG's. ? ?Lab Results  ?Component Value Date  ? CREATININE 0.96 04/19/2021  ? BUN 15 04/19/2021  ? NA 137 04/19/2021  ? K 3.6 04/19/2021  ? CL 99 04/19/2021  ? CO2 24 04/19/2021  ? ?Osteoporosis on alendronate 70 mg weekly. ? ?Knee OA, she want to know if she can use topical CBD oint. ?Unstable gait assisted by a cane. ? ?Abnormal CXR, so chest CT was ordered, done 05/22/21: ?1. Tree-in-bud opacities in the right middle lobe and right upper lobe, likely infectious/inflammatory. ?2. Additional bilateral scattered pulmonary nodules measuring up to 5 mm. No follow-up needed if patient is low-risk (and has no known or suspected primary neoplasm). Non-contrast chest CT can be considered in 12 months if patient is high-risk.  ?3. Mild cardiomegaly. ?4. Aortic Atherosclerosis (ICD10-I70.0) and Emphysema (ICD10-J43.9). ?5. Cholelithiasis. ?Lab Results  ?Component Value Date  ? ALT 11 10/31/2020  ? AST 21 10/31/2020  ? ALKPHOS 55 10/31/2020  ? BILITOT 0.4 10/31/2020  ? ?Denies cough,wheezing,or DOE. ?Negative for  abdominal pain, nausea, or vomiting. ? ?Right great toe blister after being outdoors under sun light for < 30 min this past Saturday 05/20/21, Monday blister burst and has a superficial ulcer. It was initially very sore but has improved, she has applied apinol spray and healing. ?Negative for purulent drainage, numbness,or tingling. ?No hx of trauma or new shoes. ?She attributed it to sun sensitivity due to abx treatment she completed about a week before problem presented. ? ?Her daughter has been concerned about memory problems. States that she sometimes forgets things but are not important and she does not feel like it is affecting her social interaction. ?Per records hx of alcoholism. She drinks 4 oz of wine daily and sometimes she drinks an extra 1-2 Oz. ? ?Review of Systems  ?Constitutional:  Negative for activity change, appetite change and fever.  ?HENT:  Positive for dental problem (Following with dentist.). Negative for mouth sores, nosebleeds and sore throat.   ?Eyes:  Negative for redness and visual disturbance.  ?Cardiovascular:  Negative for leg swelling.  ?Gastrointestinal:   ?     Negative for changes in bowel habits.  ?Genitourinary:  Negative for decreased urine volume, dysuria and hematuria.  ?Neurological:  Negative for seizures, syncope, weakness, numbness and headaches.  ?Psychiatric/Behavioral:  Positive for sleep disturbance. Negative for hallucinations.   ?Rest see pertinent positives and negatives per HPI. ? ?Current Outpatient Medications on File Prior to Visit  ?Medication Sig Dispense Refill  ? CALCIUM PO Take 1,300 mg by mouth daily.    ? Cyanocobalamin (VITAMIN B-12) 1000 MCG SUBL Place 1,000 mcg under the tongue daily.     ? diphenhydrAMINE (BENADRYL) 25 MG tablet Take 25 mg by  mouth at bedtime as needed for sleep.    ? furosemide (LASIX) 20 MG tablet Take 1 tablet (20 mg total) by mouth daily as needed. 90 tablet 1  ? Oyster Shell (OYSTER CALCIUM) 500 MG TABS tablet Take 1,000 mg of  elemental calcium by mouth daily.    ? vitamin C (ASCORBIC ACID) 250 MG tablet Take 500 mg by mouth daily.    ? ?No current facility-administered medications on file prior to visit.  ? ?Past Medical History:  ?Diagnosis Date  ? Alcohol abuse   ? Emphysema of lung ([place]) 01/26/2009  ? pt. unsure of this reports as of 07/31/2012  ? Fracture of fifth metatarsal bone of right foot 08/29/2012  ? Gout 08/11/2012  ? Hyperlipemia 03/27/2007  ? Hypertension   ? Osteoporosis 07/12/2006  ? Pernicious anemia 10/25/2006  ? Polymyalgia rheumatica ([place]) 03/27/2007  ? sees Dr. Charlestine Night in rheumatology  ? Vitamin D deficiency 07/12/2006  ? ?Allergies  ?Allergen Reactions  ? Naproxen Other (See Comments)  ?  Severe pain in joints and hands  ? Bactrim [Sulfamethoxazole-Trimethoprim]   ?  She had episode of confusion after starting bactrim; uncertain if related. Got dehydrated, ARF.   ? Doxycycline Other (See Comments)  ?  Burning sensation noted in the tongue and vaginal irritation  ? Aspirin Other (See Comments)  ?  "threw up blood"  ? Codeine Other (See Comments)  ?  "threw up blood"  ? ? ?Family History  ?Problem Relation Age of Onset  ? Mental illness Mother   ?     alzheimer dementia  ? Ulcers Father   ? Heart disease Father   ? Cancer Father   ?     lung, smoker  ? ? ?Social History  ? ?Socioeconomic History  ? Marital status: Married  ?  Spouse name: Not on file  ? Number of children: 2  ? Years of education: Not on file  ? Highest education level: Not on file  ?Occupational History  ? Not on file  ?Tobacco Use  ? Smoking status: Former  ?  Packs/day: 2.00  ?  Years: 58.00  ?  Pack years: 116.00  ?  Types: Cigarettes  ?  Start date: 01/23/1948  ?  Quit date: 07/12/2006  ?  Years since quitting: 14.8  ? Smokeless tobacco: Never  ?Vaping Use  ? Vaping Use: Never used  ?Substance and Sexual Activity  ? Alcohol use: No  ?  Alcohol/week: 10.0 standard drinks  ?  Types: 10 Shots of liquor per week  ?  Comment: quit completely - May 08, 2010  ? Drug use: No  ?  Sexual activity: Never  ?Other Topics Concern  ? Not on file  ?Social History Narrative  ? Work or School: none  ?   ? Home Situation: lives with husband - drives, husband takes care of finances, dresses herself, takes care of housework and cooking  ?   ? Spiritual Beliefs: [employer] church  ?   ? Lifestyle: uses cane when goes out, exercises sometimes - diet healthy  ?   ? 03/20/2018: Lives alone in one-level townhouse with dog. Husband passed away in fall of 05-07-2017. Coping well overall.  ? Has two daughters, both of whom live close by and are supportive.  ? Active in church  ? Enjoys gardening  ?   ?   ? ?Social Determinants of Health  ? ?Financial Resource Strain: Low Risk   ? Difficulty of Paying Living Expenses: Not hard  at all  ?Food Insecurity: No Food Insecurity  ? Worried About Charity fundraiser in the Last Year: Never true  ? Ran Out of Food in the Last Year: Never true  ?Transportation Needs: No Transportation Needs  ? Lack of Transportation (Medical): No  ? Lack of Transportation (Non-Medical): No  ?Physical Activity: Inactive  ? Days of Exercise per Week: 0 days  ? Minutes of Exercise per Session: 0 min  ?Stress: No Stress Concern Present  ? Feeling of Stress : Not at all  ?Social Connections: Moderately Isolated  ? Frequency of Communication with Friends and Family: Three times a week  ? Frequency of Social Gatherings with Friends and Family: Three times a week  ? Attends Religious Services: More than 4 times per year  ? Active Member of Clubs or Organizations: No  ? Attends Archivist Meetings: Never  ? Marital Status: Widowed  ? ?Vitals:  ? 05/30/21 1350  ?BP: 120/70  ?Pulse: 95  ?Resp: 16  ?SpO2: 96%  ? ?Wt Readings from Last 3 Encounters:  ?05/30/21 104 lb 2 oz (47.2 kg)  ?05/12/21 104 lb 11.2 oz (47.5 kg)  ?11/07/20 103 lb (46.7 kg)  ? ?Body mass index is 20.17 kg/m?. ? ?Physical Exam ?Vitals and nursing note reviewed.  ?Constitutional:   ?   General: She is not in acute distress. ?    Appearance: She is well-developed.  ?HENT:  ?   Head: Normocephalic and atraumatic.  ?   Mouth/Throat:  ?   Mouth: Mucous membranes are moist.  ?   Dentition: Has dentures.  ?   Pharynx: Oropharynx is clear.

## 2021-05-30 ENCOUNTER — Encounter: Payer: Self-pay | Admitting: Family Medicine

## 2021-05-30 ENCOUNTER — Ambulatory Visit (INDEPENDENT_AMBULATORY_CARE_PROVIDER_SITE_OTHER): Payer: Medicare Other | Admitting: Family Medicine

## 2021-05-30 VITALS — BP 120/70 | HR 95 | Resp 16 | Ht 60.25 in | Wt 104.1 lb

## 2021-05-30 DIAGNOSIS — M810 Age-related osteoporosis without current pathological fracture: Secondary | ICD-10-CM | POA: Diagnosis not present

## 2021-05-30 DIAGNOSIS — F1020 Alcohol dependence, uncomplicated: Secondary | ICD-10-CM | POA: Insufficient documentation

## 2021-05-30 DIAGNOSIS — R918 Other nonspecific abnormal finding of lung field: Secondary | ICD-10-CM

## 2021-05-30 DIAGNOSIS — L97511 Non-pressure chronic ulcer of other part of right foot limited to breakdown of skin: Secondary | ICD-10-CM | POA: Diagnosis not present

## 2021-05-30 DIAGNOSIS — K8020 Calculus of gallbladder without cholecystitis without obstruction: Secondary | ICD-10-CM | POA: Diagnosis not present

## 2021-05-30 DIAGNOSIS — M353 Polymyalgia rheumatica: Secondary | ICD-10-CM

## 2021-05-30 DIAGNOSIS — I700 Atherosclerosis of aorta: Secondary | ICD-10-CM | POA: Diagnosis not present

## 2021-05-30 DIAGNOSIS — I10 Essential (primary) hypertension: Secondary | ICD-10-CM

## 2021-05-30 MED ORDER — PREDNISONE 2.5 MG PO TABS: 2.5000 mg | ORAL_TABLET | Freq: Every day | ORAL | 3 refills | Status: DC

## 2021-05-30 MED ORDER — PREDNISONE 2.5 MG PO TABS
2.5000 mg | ORAL_TABLET | Freq: Every day | ORAL | 3 refills | Status: DC
Start: 2021-05-30 — End: 2021-05-30

## 2021-05-30 MED ORDER — PREDNISONE 2.5 MG PO TABS: 2.5000 mg | ORAL_TABLET | Freq: Every day | ORAL | 0 refills | Status: DC

## 2021-05-30 NOTE — Assessment & Plan Note (Addendum)
We reviewed chest CT results. ?No respiratory symptoms. ?No further follow up recommended. ? ?

## 2021-05-30 NOTE — Assessment & Plan Note (Addendum)
We discussed Dx, and prognosis. ?Recent ESR and CRP in normal range. Most likely pain and aches she has are caused by OA. ?Continue Prednisone 2.5 mg daily. ?

## 2021-05-30 NOTE — Assessment & Plan Note (Addendum)
DEXA 07/2014 was normal , at that time she was already on Fosamax. ?I am recommending to discontinue Fosamax. ?Fall precautions discussed. ?Continue Ca++ and vit D supplementation. ?

## 2021-05-30 NOTE — Patient Instructions (Addendum)
A few things to remember from today's visit: ? ?Essential hypertension ? ?Osteoporosis without current pathological fracture, unspecified osteoporosis type ? ?Skin ulcer of toe of right foot, limited to breakdown of skin (HCC) ? ?Pulmonary nodules ? ?POLYMYALGIA RHEUMATICA ? ?If you need refills please call your pharmacy. ?Do not use My Chart to request refills or for acute issues that need immediate attention. ?  ?No changes today. ?Caution with benadryl. ?I do not think we need to follow with more lung/chest imaging unless symptoms. ?Fall precautions. ? ?Monitor wound right toe, keep it clean with soap and water. ?It is ok to try CBD oint on knees. ? ?Please be sure medication list is accurate. ?If a new problem present, please set up appointment sooner than planned today. ? ? ? ? ? ? ? ?

## 2021-05-30 NOTE — Assessment & Plan Note (Signed)
BP adequately controlled. ?Continue non pharmacologic treatment, low salt diet. ?

## 2021-06-14 ENCOUNTER — Other Ambulatory Visit: Payer: Self-pay | Admitting: Family Medicine

## 2021-06-14 ENCOUNTER — Encounter: Payer: Self-pay | Admitting: Family Medicine

## 2021-06-14 ENCOUNTER — Telehealth: Payer: Self-pay | Admitting: Family Medicine

## 2021-06-14 ENCOUNTER — Ambulatory Visit (INDEPENDENT_AMBULATORY_CARE_PROVIDER_SITE_OTHER): Payer: Medicare Other | Admitting: Family Medicine

## 2021-06-14 DIAGNOSIS — M353 Polymyalgia rheumatica: Secondary | ICD-10-CM

## 2021-06-14 MED ORDER — PREDNISONE 2.5 MG PO TABS: 2.5000 mg | ORAL_TABLET | Freq: Every day | ORAL | 3 refills | Status: DC

## 2021-06-14 NOTE — Patient Instructions (Signed)
Ok to increase prednisone to 5mg  daily (2 of the 2.5mg ) daily x 2 weeks and then return to 2.5mg  as tolerated. Ok to alternate or increase to 5mg  if needed for pain, but try to stay on the 2.5mg  if that is managing your joint pain ok.

## 2021-06-14 NOTE — Telephone Encounter (Signed)
She is having tooth extraction next week and would like something to help calm her prior to the procedure.

## 2021-06-14 NOTE — Progress Notes (Signed)
Patient came to the window after her visit today to ask if the doctor could send one or two pain pills to her pharmacy before her dental appointment. Please advise.  (IC)

## 2021-06-14 NOTE — Telephone Encounter (Signed)
Patient came to the window after her visit today to ask if the doctor could send one or two pain pills to her pharmacy before her dental appointment. Please advise.  (IC) 

## 2021-06-14 NOTE — Progress Notes (Signed)
[name] DOB: 02-Mar-1928 Encounter date: 06/14/2021  This is a 88 y.o. female who presents with Chief Complaint  Patient presents with   Medication Refill    Patient requests an increased dose of Prednisone due to pain from the waist down and difficulty walking which she feels is from a  flare-up of PMR and requests a medication to calm her down prior to tooth extraction next week    History of present illness:  Has been feeling some pain for awhile. Both shoulders bother her - can't lay on them. Hips have bothered her all winter. Feels pain from waist down. Has been on prednisone since 1987 for PMR. Has been pretty stable on low dose. Initially couldn't turn head to look at side mirror while driving.   Takes her prednisone usually first thing in the morning. Accidentally took extra and then repeated this a couple of days and really felt better. Knows that on higher dose she gets hair loss, shaky.   Allergies  Allergen Reactions   Naproxen Other (See Comments)    Severe pain in joints and hands   Bactrim [Sulfamethoxazole-Trimethoprim]     She had episode of confusion after starting bactrim; uncertain if related. Got dehydrated, ARF.    Doxycycline Other (See Comments)    Burning sensation noted in the tongue and vaginal irritation   Aspirin Other (See Comments)    "threw up blood"   Codeine Other (See Comments)    "threw up blood"   Current Meds  Medication Sig   CALCIUM PO Take 1,300 mg by mouth daily.   Cyanocobalamin (VITAMIN B-12) 1000 MCG SUBL Place 1,000 mcg under the tongue daily.    diphenhydrAMINE (BENADRYL) 25 MG tablet Take 25 mg by mouth at bedtime as needed for sleep.   furosemide (LASIX) 20 MG tablet Take 1 tablet (20 mg total) by mouth daily as needed.   Oyster Shell (OYSTER CALCIUM) 500 MG TABS tablet Take 1,000 mg of elemental calcium by mouth daily.   predniSONE (DELTASONE) 2.5 MG tablet Take 1 tablet (2.5 mg total) by mouth daily with breakfast.    predniSONE (DELTASONE) 2.5 MG tablet Take 1 tablet (2.5 mg total) by mouth daily with breakfast.   vitamin C (ASCORBIC ACID) 250 MG tablet Take 500 mg by mouth daily.    Review of Systems  Constitutional:  Negative for chills, fatigue and fever.  Respiratory:  Negative for cough, chest tightness, shortness of breath and wheezing.   Cardiovascular:  Negative for chest pain, palpitations and leg swelling.  Musculoskeletal:  Positive for arthralgias and gait problem (when hips are hurting her).   Objective:  BP 120/60 (BP Location: Left Arm, Patient Position: Sitting, Cuff Size: Normal)   Pulse (!) 105   Temp 97.6 F (36.4 C) (Oral)   Ht 5' 0.25" (1.53 m)   Wt 103 lb 9.6 oz (47 kg)   SpO2 95%   BMI 20.07 kg/m   Weight: 103 lb 9.6 oz (47 kg)   BP Readings from Last 3 Encounters:  06/14/21 120/60  05/30/21 120/70  05/12/21 138/60   Wt Readings from Last 3 Encounters:  06/14/21 103 lb 9.6 oz (47 kg)  05/30/21 104 lb 2 oz (47.2 kg)  05/12/21 104 lb 11.2 oz (47.5 kg)    Physical Exam Constitutional:      General: She is not in acute distress.    Appearance: She is underweight.  Cardiovascular:     Rate and Rhythm: Normal rate and regular rhythm.  Heart sounds: Normal heart sounds. No murmur heard.   No friction rub.  Pulmonary:     Effort: Pulmonary effort is normal. No respiratory distress.     Breath sounds: Normal breath sounds. No wheezing or rales.  Musculoskeletal:     Right lower leg: No edema.     Left lower leg: No edema.     Comments: Slow with getting up from chair.  Difficulty with abducting arms more than 90 degrees.  Neurological:     Mental Status: She is alert and oriented to person, place, and time.  Psychiatric:        Behavior: Behavior normal.    Assessment/Plan 1. POLYMYALGIA RHEUMATICA She did significantly better with joint pain taking 5 mg of prednisone daily rather than 2.5.  She does not wish to be on a high dose of prednisone long-term.  I  am going to write for her prednisone so that she can, when having flares or if needed, take 5 mg daily for a few days to help take down inflammation.  She will follow-up with Dr. [place].  - predniSONE (DELTASONE) 2.5 MG tablet; Take 1-2 tablets (2.5-5 mg total) by mouth daily with breakfast.  Dispense: 180 tablet; Refill: 3    Return if symptoms worsen or fail to improve.     Theodis Shove, MD

## 2021-06-15 NOTE — Telephone Encounter (Signed)
Pt is calling and would like medication sent to  [facility] [address], [place] - [address] Phone:  [phone]  Fax:  [phone]

## 2021-06-16 NOTE — Telephone Encounter (Signed)
I spoke with patient's daughter. Patient's appointment is on Tuesday afternoon at 3pm. I advised that PCP is out of office until Tuesday morning, but I will speak with her & call them back Tuesday AM. Patient's daughter verbalized understanding.

## 2021-06-16 NOTE — Telephone Encounter (Signed)
Pt calling stating she is stressed about her tooth extractions and asking for something to help calm her. States she spoke to Dr Hassan Rowan about this.

## 2021-06-20 ENCOUNTER — Other Ambulatory Visit: Payer: Self-pay | Admitting: Family Medicine

## 2021-06-20 MED ORDER — LORAZEPAM 0.5 MG PO TABS
0.2500 mg | ORAL_TABLET | Freq: Once | ORAL | 0 refills | Status: AC
Start: 2021-06-20 — End: 2021-06-20

## 2021-06-20 NOTE — Telephone Encounter (Signed)
I called and spoke with patient's daughter. She is aware of message below and verbalized understanding.

## 2021-06-20 NOTE — Telephone Encounter (Signed)
Given her age we have to be careful with medications, most medications for acute anxiety cause sedation among other possible side effects. An option would be ativan 1/2 to 1 tab 15 min before procedure and close monitoring for side effects. I am sending prescription to her pharmacy. Thanks, BJ

## 2021-07-04 ENCOUNTER — Telehealth: Payer: Self-pay | Admitting: *Deleted

## 2021-07-04 NOTE — Telephone Encounter (Signed)
[facility] faxed a refill request for Alendronate.  Message sent to PCP as last Rx was given by Dr Hassan Rowan.

## 2021-07-05 NOTE — Telephone Encounter (Signed)
Rx was discontinued.

## 2021-07-28 ENCOUNTER — Ambulatory Visit (INDEPENDENT_AMBULATORY_CARE_PROVIDER_SITE_OTHER): Payer: Medicare Other

## 2021-07-28 VITALS — Ht 60.0 in | Wt 103.0 lb

## 2021-07-28 DIAGNOSIS — Z0000 Encounter for general adult medical examination without abnormal findings: Secondary | ICD-10-CM | POA: Diagnosis not present

## 2021-07-28 NOTE — Patient Instructions (Addendum)
[name] , Thank you for taking time to come for your Medicare Wellness Visit. I appreciate your ongoing commitment to your health goals. Please review the following plan we discussed and let me know if I can assist you in the future.   These are the goals we discussed:  Goals       Patient Stated      Maintain independence, keep away from sick people!       Stay healthy (pt-stated)        This is a list of the screening recommended for you and due dates:  Health Maintenance  Topic Date Due   COVID-19 Vaccine (6 - Pfizer series) 08/13/2021*   Zoster (Shingles) Vaccine (1 of 2) 10/28/2021*   Flu Shot  08/22/2021   Tetanus Vaccine  07/28/2030   Pneumonia Vaccine  Completed   DEXA scan (bone density measurement)  Completed   HPV Vaccine  Aged Out  *Topic was postponed. The date shown is not the original due date.     Advanced directives: Yes  Conditions/risks identified: None  Next appointment: Follow up in one year for your annual wellness visit     Preventive Care 65 Years and Older, Female Preventive care refers to lifestyle choices and visits with your health care provider that can promote health and wellness. What does preventive care include? A yearly physical exam. This is also called an annual well check. Dental exams once or twice a year. Routine eye exams. Ask your health care provider how often you should have your eyes checked. Personal lifestyle choices, including: Daily care of your teeth and gums. Regular physical activity. Eating a healthy diet. Avoiding tobacco and drug use. Limiting alcohol use. Practicing safe sex. Taking low-dose aspirin every day. Taking vitamin and mineral supplements as recommended by your health care provider. What happens during an annual well check? The services and screenings done by your health care provider during your annual well check will depend on your age, overall health, lifestyle risk factors, and family  history of disease. Counseling  Your health care provider may ask you questions about your: Alcohol use. Tobacco use. Drug use. Emotional well-being. Home and relationship well-being. Sexual activity. Eating habits. History of falls. Memory and ability to understand (cognition). Work and work Astronomer. Reproductive health. Screening  You may have the following tests or measurements: Height, weight, and BMI. Blood pressure. Lipid and cholesterol levels. These may be checked every 5 years, or more frequently if you are over 42 years old. Skin check. Lung cancer screening. You may have this screening every year starting at age 15 if you have a 30-pack-year history of smoking and currently smoke or have quit within the past 15 years. Fecal occult blood test (FOBT) of the stool. You may have this test every year starting at age 36. Flexible sigmoidoscopy or colonoscopy. You may have a sigmoidoscopy every 5 years or a colonoscopy every 10 years starting at age 58. Hepatitis C blood test. Hepatitis B blood test. Sexually transmitted disease (STD) testing. Diabetes screening. This is done by checking your blood sugar (glucose) after you have not eaten for a while (fasting). You may have this done every 1-3 years. Bone density scan. This is done to screen for osteoporosis. You may have this done starting at age 15. Mammogram. This may be done every 1-2 years. Talk to your health care provider about how often you should have regular mammograms. Talk with your health care provider about your test results,  treatment options, and if necessary, the need for more tests. Vaccines  Your health care provider may recommend certain vaccines, such as: Influenza vaccine. This is recommended every year. Tetanus, diphtheria, and acellular pertussis (Tdap, Td) vaccine. You may need a Td booster every 10 years. Zoster vaccine. You may need this after age 17. Pneumococcal 13-valent conjugate (PCV13)  vaccine. One dose is recommended after age 65. Pneumococcal polysaccharide (PPSV23) vaccine. One dose is recommended after age 20. Talk to your health care provider about which screenings and vaccines you need and how often you need them. This information is not intended to replace advice given to you by your health care provider. Make sure you discuss any questions you have with your health care provider. Document Released: 02/04/2015 Document Revised: 09/28/2015 Document Reviewed: 11/09/2014 Elsevier Interactive Patient Education  2017 [place] Prevention in the Home Falls can cause injuries. They can happen to people of all ages. There are many things you can do to make your home safe and to help prevent falls. What can I do on the outside of my home? Regularly fix the edges of walkways and driveways and fix any cracks. Remove anything that might make you trip as you walk through a door, such as a raised step or threshold. Trim any bushes or trees on the path to your home. Use bright outdoor lighting. Clear any walking paths of anything that might make someone trip, such as rocks or tools. Regularly check to see if handrails are loose or broken. Make sure that both sides of any steps have handrails. Any raised decks and porches should have guardrails on the edges. Have any leaves, snow, or ice cleared regularly. Use sand or salt on walking paths during winter. Clean up any spills in your garage right away. This includes oil or grease spills. What can I do in the bathroom? Use night lights. Install grab bars by the toilet and in the tub and shower. Do not use towel bars as grab bars. Use non-skid mats or decals in the tub or shower. If you need to sit down in the shower, use a plastic, non-slip stool. Keep the floor dry. Clean up any water that spills on the floor as soon as it happens. Remove soap buildup in the tub or shower regularly. Attach bath mats securely with  double-sided non-slip rug tape. Do not have throw rugs and other things on the floor that can make you trip. What can I do in the bedroom? Use night lights. Make sure that you have a light by your bed that is easy to reach. Do not use any sheets or blankets that are too big for your bed. They should not hang down onto the floor. Have a firm chair that has side arms. You can use this for support while you get dressed. Do not have throw rugs and other things on the floor that can make you trip. What can I do in the kitchen? Clean up any spills right away. Avoid walking on wet floors. Keep items that you use a lot in easy-to-reach places. If you need to reach something above you, use a strong step stool that has a grab bar. Keep electrical cords out of the way. Do not use floor polish or wax that makes floors slippery. If you must use wax, use non-skid floor wax. Do not have throw rugs and other things on the floor that can make you trip. What can I do with my stairs? Do  not leave any items on the stairs. Make sure that there are handrails on both sides of the stairs and use them. Fix handrails that are broken or loose. Make sure that handrails are as long as the stairways. Check any carpeting to make sure that it is firmly attached to the stairs. Fix any carpet that is loose or worn. Avoid having throw rugs at the top or bottom of the stairs. If you do have throw rugs, attach them to the floor with carpet tape. Make sure that you have a light switch at the top of the stairs and the bottom of the stairs. If you do not have them, ask someone to add them for you. What else can I do to help prevent falls? Wear shoes that: Do not have high heels. Have rubber bottoms. Are comfortable and fit you well. Are closed at the toe. Do not wear sandals. If you use a stepladder: Make sure that it is fully opened. Do not climb a closed stepladder. Make sure that both sides of the stepladder are locked  into place. Ask someone to hold it for you, if possible. Clearly mark and make sure that you can see: Any grab bars or handrails. First and last steps. Where the edge of each step is. Use tools that help you move around (mobility aids) if they are needed. These include: Canes. Walkers. Scooters. Crutches. Turn on the lights when you go into a dark area. Replace any light bulbs as soon as they burn out. Set up your furniture so you have a clear path. Avoid moving your furniture around. If any of your floors are uneven, fix them. If there are any pets around you, be aware of where they are. Review your medicines with your doctor. Some medicines can make you feel dizzy. This can increase your chance of falling. Ask your doctor what other things that you can do to help prevent falls. This information is not intended to replace advice given to you by your health care provider. Make sure you discuss any questions you have with your health care provider. Document Released: 11/04/2008 Document Revised: 06/16/2015 Document Reviewed: 02/12/2014 Elsevier Interactive Patient Education  2017 [employer].

## 2021-07-28 NOTE — Progress Notes (Signed)
Subjective:   TALYA LIVERMORE is a 88 y.o. female who presents for Medicare Annual (Subsequent) preventive examination.  Review of Systems    Virtual Visit via Telephone Note  I connected with  INFINITY WIEDERHOLT on 07/28/21 at 12:30 PM EDT by telephone and verified that I am speaking with the correct person using two identifiers.  Location: Patient: Home Provider: Office Persons participating in the virtual visit: patient/Nurse Health Advisor   I discussed the limitations, risks, security and privacy concerns of performing an evaluation and management service by telephone and the availability of in person appointments. The patient expressed understanding and agreed to proceed.  Interactive audio and video telecommunications were attempted between this nurse and patient, however failed, due to patient having technical difficulties OR patient did not have access to video capability.  We continued and completed visit with audio only.  Some vital signs may be absent or patient reported.   Tillie Rung, LPN  Cardiac Risk Factors include: advanced age (>33men, >61 women);hypertension     Objective:    Today's Vitals   07/28/21 1236  Weight: 103 lb (46.7 kg)  Height: 5' (1.524 m)   Body mass index is 20.12 kg/m.     07/28/2021   12:45 PM 08/31/2020    5:00 AM 08/30/2020    8:02 PM 07/27/2020   11:28 AM 03/21/2018    8:33 AM 01/27/2018    5:53 PM 02/14/2017    8:00 AM  Advanced Directives  Does Patient Have a Medical Advance Directive? Yes Yes Yes Yes Yes Yes Yes  Type of Estate agent of [place];Living will Healthcare Power of [place];Living will Living will;Healthcare Power of [employer] Power of [place];Living will Healthcare Power of [place];Living will Healthcare Power of [place];Living will Healthcare Power of [place];Living will  Does patient want to make changes to medical advance directive? No - Patient declined No - Patient  declined   No - Patient declined  No - Patient declined  Copy of Healthcare Power of Attorney in Chart? No - copy requested No - copy requested  No - copy requested No - copy requested  No - copy requested    Current Medications (verified) Outpatient Encounter Medications as of 07/28/2021  Medication Sig   CALCIUM PO Take 1,300 mg by mouth daily.   Cyanocobalamin (VITAMIN B-12) 1000 MCG SUBL Place 1,000 mcg under the tongue daily.    diphenhydrAMINE (BENADRYL) 25 MG tablet Take 25 mg by mouth at bedtime as needed for sleep.   furosemide (LASIX) 20 MG tablet Take 1 tablet (20 mg total) by mouth daily as needed.   Oyster Shell (OYSTER CALCIUM) 500 MG TABS tablet Take 1,000 mg of elemental calcium by mouth daily.   predniSONE (DELTASONE) 2.5 MG tablet Take 1-2 tablets (2.5-5 mg total) by mouth daily with breakfast.   vitamin C (ASCORBIC ACID) 250 MG tablet Take 500 mg by mouth daily.   No facility-administered encounter medications on file as of 07/28/2021.    Allergies (verified) Naproxen, Bactrim [sulfamethoxazole-trimethoprim], Doxycycline, Aspirin, and Codeine   History: Past Medical History:  Diagnosis Date   Alcohol abuse    Emphysema of lung (HCC) 01/26/2009   pt. unsure of this reports as of 07/31/2012   Fracture of fifth metatarsal bone of right foot 08/29/2012   Gout 08/11/2012   Hyperlipemia 03/27/2007   Hypertension    Osteoporosis 07/12/2006   Pernicious anemia 10/25/2006   Polymyalgia rheumatica (HCC) 03/27/2007   sees Dr. Kellie Simmering in  rheumatology   Vitamin D deficiency 07/12/2006   Past Surgical History:  Procedure Laterality Date   APPENDECTOMY  07/12/2006   LUMBAR LAMINECTOMY/DECOMPRESSION MICRODISCECTOMY Right 02/13/2017   Procedure: Microdiscectomy - right - Lumbar three-Lumbar four;  Surgeon: Tia Alert, MD;  Location: [facility];  Service: Neurosurgery;  Laterality: Right;   PARTIAL HIP ARTHROPLASTY     left side- 2x's ( 2nd surgery to correct 1st replacement), R side -     TOOTH EXTRACTION N/A 08/04/2012   Procedure: EXTRACTIONS 17, 20;  Surgeon: Georgia Lopes, DDS;  Location: [facility] OR;  Service: Oral Surgery;  Laterality: N/A;   Family History  Problem Relation Age of Onset   Mental illness Mother        alzheimer dementia   Ulcers Father    Heart disease Father    Cancer Father        lung, smoker   Social History   Socioeconomic History   Marital status: Married    Spouse name: Not on file   Number of children: 2   Years of education: Not on file   Highest education level: Not on file  Occupational History   Not on file  Tobacco Use   Smoking status: Former    Packs/day: 2.00    Years: 58.00    Total pack years: 116.00    Types: Cigarettes    Start date: 01[birth year]23/1946    Quit date: 07/12/2006    Years since quitting: 15.0   Smokeless tobacco: Never  Vaping Use   Vaping Use: Never used  Substance and Sexual Activity   Alcohol use: No    Alcohol/week: 10.0 standard drinks of alcohol    Types: 10 Shots of liquor per week    Comment: quit completely - 2010/05/02   Drug use: No   Sexual activity: Never  Other Topics Concern   Not on file  Social History Narrative   Work or School: none      Home Situation: lives with husband - drives, husband takes care of finances, dresses herself, takes care of housework and cooking      Spiritual Beliefs: [employer]      Lifestyle: uses cane when goes out, exercises sometimes - diet healthy      03/20/2018: Lives alone in one-level townhouse with dog. Husband passed away in fall of 01-May-2017. Coping well overall.   Has two daughters, both of whom live close by and are supportive.   Active in church   Enjoys gardening         Social Determinants of Health   Financial Resource Strain: Low Risk  (07/28/2021)   Overall Financial Resource Strain (CARDIA)    Difficulty of Paying Living Expenses: Not hard at all  Food Insecurity: No Food Insecurity (07/28/2021)   Hunger Vital Sign    Worried About Running Out  of Food in the Last Year: Never true    Ran Out of Food in the Last Year: Never true  Transportation Needs: No Transportation Needs (07/28/2021)   PRAPARE - Administrator, Civil Service (Medical): No    Lack of Transportation (Non-Medical): No  Physical Activity: Inactive (07/28/2021)   Exercise Vital Sign    Days of Exercise per Week: 0 days    Minutes of Exercise per Session: 0 min  Stress: No Stress Concern Present (07/28/2021)   [employer] of Occupational Health - Occupational Stress Questionnaire    Feeling of Stress : Not at all  Social  Connections: Moderately Integrated (07/28/2021)   Social Connection and Isolation Panel [NHANES]    Frequency of Communication with Friends and Family: More than three times a week    Frequency of Social Gatherings with Friends and Family: More than three times a week    Attends Religious Services: More than 4 times per year    Active Member of [employer] or Organizations: Yes    Attends Banker Meetings: More than 4 times per year    Marital Status: Widowed    Tobacco Counseling Counseling given: Not Answered   Clinical Intake:  Pre-visit preparation completed: No Diabetic?  No    Activities of Daily Living    07/28/2021   12:42 PM 08/31/2020    5:00 AM  In your present state of health, do you have any difficulty performing the following activities:  Hearing? 1 1  Comment Wears hearing aids   Vision? 0 0  Difficulty concentrating or making decisions? 0 0  Walking or climbing stairs? 0 1  Dressing or bathing? 0 1  Doing errands, shopping? 0 1  Preparing Food and eating ? N   Using the Toilet? N   In the past six months, have you accidently leaked urine? Y   Comment Wears panty liners   Do you have problems with loss of bowel control? N   Managing your Medications? N   Managing your Finances? N   Housekeeping or managing your Housekeeping? N     Patient Care Team: [place], Betty G, MD as PCP - General  (Family Medicine) Antony Contras, MD as Consulting Physician (Ophthalmology)  Indicate any recent Medical Services you may have received from other than [facility] providers in the past year (date may be approximate).     Assessment:   This is a routine wellness examination for [place].  Hearing/Vision screen Hearing Screening - Comments:: Wears hearing aids Vision Screening - Comments:: Wears glasses. Followed by Dr Randon Goldsmith  Dietary issues and exercise activities discussed: Exercise limited by: None identified   Goals Addressed               This Visit's Progress     Stay healthy (pt-stated)         Depression Screen    07/28/2021   12:41 PM 05/30/2021    1:59 PM 08/26/2020    5:11 PM 07/27/2020   11:29 AM 07/27/2020   11:26 AM 03/20/2018    3:30 PM 08/30/2016   10:31 AM  PHQ 2/9 Scores  PHQ - 2 Score 0 0 0 0 0 1 0  PHQ- 9 Score   0   1     Fall Risk    07/28/2021   12:44 PM 05/30/2021    1:59 PM 08/26/2020    4:07 PM 07/27/2020   11:28 AM 03/20/2018    3:30 PM  Fall Risk   Falls in the past year? 0 0 0 0 0  Number falls in past yr: 0 0 0 0   Injury with Fall? 0 0  0   Risk for fall due to :  History of fall(s)  Impaired balance/gait History of fall(s);Impaired vision;Impaired mobility  Follow up  Falls evaluation completed  Falls evaluation completed Falls prevention discussed;Education provided    FALL RISK PREVENTION PERTAINING TO THE HOME:  Any stairs in or around the home? Yes  If so, are there any without handrails? No  Home free of loose throw rugs in walkways, pet beds, electrical cords, etc? Yes  Adequate lighting in your home to reduce risk of falls? Yes   ASSISTIVE DEVICES UTILIZED TO PREVENT FALLS:  Life alert? Yes  Use of a cane, walker or w/c? Yes  Grab bars in the bathroom? No  Shower chair or bench in shower? Yes  Elevated toilet seat or a handicapped toilet? No   TIMED UP AND GO:  Was the test performed? No . Audio Visit  Cognitive Function:     08/30/2016   10:41 AM  MMSE - Mini Mental State Exam  Orientation to time 5  Orientation to Place 5  Registration 3  Attention/ Calculation 5  Recall 2  Language- name 2 objects 2  Language- repeat 1  Language- follow 3 step command 3  Language- read & follow direction 1  Write a sentence 1  Copy design 0  Total score 28        07/28/2021   12:45 PM  6CIT Screen  What Year? 0 points  What month? 0 points  What time? 0 points  Count back from 20 2 points  Months in reverse 0 points  Repeat phrase 0 points  Total Score 2 points    Immunizations Immunization History  Administered Date(s) Administered   Fluad Quad(high Dose 65+) 09/26/2018, 10/26/2019, 10/28/2020   Influenza Split 10/24/2010, 10/04/2011   Influenza Whole 12/18/2006, 11/12/2007, 10/19/2008, 10/25/2009   Influenza, High Dose Seasonal PF 10/12/2014, 10/10/2015, 10/25/2016, 11/25/2017   Influenza,inj,Quad PF,6+ Mos 10/02/2012, 10/21/2013   PFIZER(Purple Top)SARS-COV-2 Vaccination 02/08/2019, 03/02/2019, 11/02/2019, 06/06/2020   PNEUMOCOCCAL CONJUGATE-20 10/28/2020   Pneumococcal Conjugate-13 07/20/2014   Pneumococcal Polysaccharide-23 11/23/1994, 09/08/2012   Td 06/22/1997, 10/15/2007   Tdap 07/27/2020   Unspecified SARS-COV-2 Vaccination 12/14/2020    TDAP status: Up to date  Flu Vaccine status: Up to date  Pneumococcal vaccine status: Up to date  Covid-19 vaccine status: Completed vaccines  Qualifies for Shingles Vaccine? Yes   Zostavax completed No   Shingrix Completed?: No.    Education has been provided regarding the importance of this vaccine. Patient has been advised to call insurance company to determine out of pocket expense if they have not yet received this vaccine. Advised may also receive vaccine at local pharmacy or Health Dept. Verbalized acceptance and understanding.  Screening Tests Health Maintenance  Topic Date Due   COVID-19 Vaccine (6 - Pfizer series) 08/13/2021 (Originally  02/08/2021)   Zoster Vaccines- Shingrix (1 of 2) 10/28/2021 (Originally [birth year]/1980)   INFLUENZA VACCINE  08/22/2021   TETANUS/TDAP  07/28/2030   Pneumonia Vaccine 26+ Years old  Completed   DEXA SCAN  Completed   HPV VACCINES  Aged Out    Health Maintenance  There are no preventive care reminders to display for this patient.   Colorectal cancer screening: No longer required.   Mammogram status: No longer required due to Age.  Bone Density status: Completed 06/30/14. Results reflect: Bone density results: OSTEOPOROSIS. Repeat every   years.  Lung Cancer Screening: (Low Dose CT Chest recommended if Age 48-80 years, 30 pack-year currently smoking OR have quit w/in 15years.) does not qualify.     Additional Screening:  Hepatitis C Screening: does not qualify; Completed   Vision Screening: Recommended annual ophthalmology exams for early detection of glaucoma and other disorders of the eye. Is the patient up to date with their annual eye exam?  Yes  Who is the provider or what is the name of the office in which the patient attends annual eye exams? Dr Prudencio Burly If pt is not  established with a provider, would they like to be referred to a provider to establish care? No .   Dental Screening: Recommended annual dental exams for proper oral hygiene  Community Resource Referral / Chronic Care Management:  CRR required this visit?  No   CCM required this visit?  No      Plan:     I have personally reviewed and noted the following in the patient's chart:   Medical and social history Use of alcohol, tobacco or illicit drugs  Current medications and supplements including opioid prescriptions.  Functional ability and status Nutritional status Physical activity Advanced directives List of other physicians Hospitalizations, surgeries, and ER visits in previous 12 months Vitals Screenings to include cognitive, depression, and falls Referrals and appointments  In addition, I have  reviewed and discussed with patient certain preventive protocols, quality metrics, and best practice recommendations. A written personalized care plan for preventive services as well as general preventive health recommendations were provided to patient.     Criselda Peaches, LPN   [id number]   Nurse Notes: None

## 2021-08-09 ENCOUNTER — Telehealth: Payer: Self-pay | Admitting: Family Medicine

## 2021-08-09 NOTE — Telephone Encounter (Addendum)
Pt scheduled for Friday - 08/11/21 @ 3pm

## 2021-08-09 NOTE — Telephone Encounter (Signed)
Let's schedule an OV & we can do labs at that time too. Thank you!

## 2021-08-09 NOTE — Telephone Encounter (Signed)
Pt is calling to see if MD can put in a request for some lab work. Pt states her "urinary pattern has changed" and she has some concerns.  Please advise, so that we can schedule patient or labs.

## 2021-08-09 NOTE — Progress Notes (Unsigned)
ACUTE VISIT Chief Complaint  Patient presents with   urinary problem    Wants to have kidneys rechecked.    HPI: [name] is a 88 y.o. female, who is here today complaining of *** HPI No results found for: "LABURIC"  Review of Systems Rest see pertinent positives and negatives per HPI.  Current Outpatient Medications on File Prior to Visit  Medication Sig Dispense Refill   CALCIUM PO Take 1,300 mg by mouth daily.     Cyanocobalamin (VITAMIN B-12) 1000 MCG SUBL Place 1,000 mcg under the tongue daily.      diphenhydrAMINE (BENADRYL) 25 MG tablet Take 25 mg by mouth at bedtime as needed for sleep.     furosemide (LASIX) 20 MG tablet Take 1 tablet (20 mg total) by mouth daily as needed. 90 tablet 1   Oyster Shell (OYSTER CALCIUM) 500 MG TABS tablet Take 1,000 mg of elemental calcium by mouth daily.     predniSONE (DELTASONE) 2.5 MG tablet Take 1-2 tablets (2.5-5 mg total) by mouth daily with breakfast. 180 tablet 3   vitamin C (ASCORBIC ACID) 250 MG tablet Take 500 mg by mouth daily.     No current facility-administered medications on file prior to visit.     Past Medical History:  Diagnosis Date   Alcohol abuse    Emphysema of lung ([place]) 01/26/2009   pt. unsure of this reports as of 07/31/2012   Fracture of fifth metatarsal bone of right foot 08/29/2012   Gout 08/11/2012   Hyperlipemia 03/27/2007   Hypertension    Osteoporosis 07/12/2006   Pernicious anemia 10/25/2006   Polymyalgia rheumatica ([place]) 03/27/2007   sees Dr. Charlestine Night in rheumatology   Vitamin D deficiency 07/12/2006   Allergies  Allergen Reactions   Naproxen Other (See Comments)    Severe pain in joints and hands   Bactrim [Sulfamethoxazole-Trimethoprim]     She had episode of confusion after starting bactrim; uncertain if related. Got dehydrated, ARF.    Doxycycline Other (See Comments)    Burning sensation noted in the tongue and vaginal irritation   Aspirin Other (See Comments)    "threw up blood"    Codeine Other (See Comments)    "threw up blood"    Social History   Socioeconomic History   Marital status: Married    Spouse name: Not on file   Number of children: 2   Years of education: Not on file   Highest education level: Not on file  Occupational History   Not on file  Tobacco Use   Smoking status: Former    Packs/day: 2.00    Years: 58.00    Total pack years: 116.00    Types: Cigarettes    Start date: 01/23/1946    Quit date: 07/12/2006    Years since quitting: 15.0   Smokeless tobacco: Never  Vaping Use   Vaping Use: Never used  Substance and Sexual Activity   Alcohol use: No    Alcohol/week: 10.0 standard drinks of alcohol    Types: 10 Shots of liquor per week    Comment: quit completely - 2012   Drug use: No   Sexual activity: Never  Other Topics Concern   Not on file  Social History Narrative   Work or School: none      Home Situation: lives with husband - drives, husband takes care of finances, dresses herself, takes care of housework and cooking      Spiritual Beliefs: [employer]  Lifestyle: uses cane when goes out, exercises sometimes - diet healthy      03/20/2018: Lives alone in one-level townhouse with dog. Husband passed away in fall of 2017/03/11. Coping well overall.   Has two daughters, both of whom live close by and are supportive.   Active in church   Enjoys gardening         Social Determinants of Health   Financial Resource Strain: Low Risk  (07/28/2021)   Overall Financial Resource Strain (CARDIA)    Difficulty of Paying Living Expenses: Not hard at all  Food Insecurity: No Food Insecurity (07/28/2021)   Hunger Vital Sign    Worried About Running Out of Food in the Last Year: Never true    Ran Out of Food in the Last Year: Never true  Transportation Needs: No Transportation Needs (07/28/2021)   PRAPARE - Hydrologist (Medical): No    Lack of Transportation (Non-Medical): No  Physical Activity:  Inactive (07/28/2021)   Exercise Vital Sign    Days of Exercise per Week: 0 days    Minutes of Exercise per Session: 0 min  Stress: No Stress Concern Present (07/28/2021)   [place]    Feeling of Stress : Not at all  Social Connections: Moderately Integrated (07/28/2021)   Social Connection and Isolation Panel [NHANES]    Frequency of Communication with Friends and Family: More than three times a week    Frequency of Social Gatherings with Friends and Family: More than three times a week    Attends Religious Services: More than 4 times per year    Active Member of [employer] or Organizations: Yes    Attends Archivist Meetings: More than 4 times per year    Marital Status: Widowed    Vitals:   08/11/21 1510  BP: 120/70  Pulse: 89  SpO2: 98%   Body mass index is 19.8 kg/m.  Physical Exam Vitals and nursing note reviewed.  Constitutional:      General: She is not in acute distress.    Appearance: She is well-developed.  HENT:     Head: Normocephalic and atraumatic.     Mouth/Throat:     Mouth: Mucous membranes are moist.     Pharynx: Oropharynx is clear.  Eyes:     Conjunctiva/sclera: Conjunctivae normal.  Cardiovascular:     Rate and Rhythm: Normal rate. Rhythm irregular.     Pulses:          Dorsalis pedis pulses are 2+ on the right side and 2+ on the left side.     Heart sounds: No murmur heard.    Comments: Varicose veins LE,bilateral. Pulmonary:     Effort: Pulmonary effort is normal. No respiratory distress.     Breath sounds: Normal breath sounds.  Abdominal:     Palpations: Abdomen is soft. There is no hepatomegaly or mass.     Tenderness: There is no abdominal tenderness.  Musculoskeletal:     Right hand: No swelling, tenderness or bony tenderness. Decreased range of motion.     Right lower leg: No edema.     Left lower leg: No edema.  Lymphadenopathy:     Cervical: No cervical adenopathy.   Skin:    General: Skin is warm.     Findings: No erythema or rash.       Neurological:     General: No focal deficit present.     Mental  Status: She is alert. Mental status is at baseline.     Cranial Nerves: No cranial nerve deficit.     Gait: Gait normal.     Comments: Unstable gait, assisted with a cane.  Psychiatric:     Comments: Well groomed, good eye contact.   ASSESSMENT AND PLAN:  [name] was seen today for urinary problem.  Diagnoses and all orders for this visit:  Right hand pain  Venous stasis dermatitis of both lower extremities  Stage 3a chronic kidney disease (HCC) -     Microalbumin / creatinine urine ratio; Future -     BMP with eGFR([facility]); Future     Return if symptoms worsen or fail to improve, for Keep next appt.   Trust Leh G. [place], MD  [facility]. [place] office.

## 2021-08-11 ENCOUNTER — Encounter: Payer: Self-pay | Admitting: Family Medicine

## 2021-08-11 ENCOUNTER — Ambulatory Visit: Payer: Medicare Other | Admitting: Family Medicine

## 2021-08-11 ENCOUNTER — Ambulatory Visit (INDEPENDENT_AMBULATORY_CARE_PROVIDER_SITE_OTHER): Payer: Medicare Other | Admitting: Family Medicine

## 2021-08-11 VITALS — BP 120/70 | HR 89 | Resp 16 | Ht 60.0 in | Wt 101.4 lb

## 2021-08-11 DIAGNOSIS — I4891 Unspecified atrial fibrillation: Secondary | ICD-10-CM | POA: Diagnosis not present

## 2021-08-11 DIAGNOSIS — N1831 Chronic kidney disease, stage 3a: Secondary | ICD-10-CM

## 2021-08-11 DIAGNOSIS — I872 Venous insufficiency (chronic) (peripheral): Secondary | ICD-10-CM | POA: Diagnosis not present

## 2021-08-11 DIAGNOSIS — M79641 Pain in right hand: Secondary | ICD-10-CM

## 2021-08-11 DIAGNOSIS — N183 Chronic kidney disease, stage 3 unspecified: Secondary | ICD-10-CM | POA: Insufficient documentation

## 2021-08-11 NOTE — Patient Instructions (Addendum)
A few things to remember from today's visit:  Right hand pain  Venous stasis dermatitis of both lower extremities  Stage 3a chronic kidney disease ([place]) - Plan: Microalbumin / creatinine urine ratio, BMP with eGFR([facility])  Hand pain could have been gout.  Do not use My Chart to request refills or for acute issues that need immediate attention.  Please be sure medication list is accurate. If a new problem present, please set up appointment sooner than planned today.  Stasis Dermatitis Stasis dermatitis is a long-term (chronic) skin condition that happens when veins can no longer pump blood back to the heart (poor circulation). This condition causes a red or brown scaly rash or sores (ulcers) from the pooling of blood (stasis). This condition usually affects the lower legs. It may affect one leg or both legs. Without treatment, severe stasis dermatitis can lead to other skin conditions and infections. What are the causes? This condition is caused by poor circulation. What increases the risk? You are more likely to develop this condition if: You are not very active. You stand for long periods of time. You have veins that have become enlarged and twisted (varicose veins). You have leg veins that are not strong enough to send blood back to the heart (venous insufficiency). You have had a blood clot. You have been pregnant many times. You have had vein surgery. You are obese. You have heart or kidney failure. You are 86 years of age or older. You have had injuries to your legs in the past. What are the signs or symptoms? Common early symptoms of this condition include: Itchiness in one or both of your legs. Swelling in your ankle or leg. This might get better overnight but be worse again during the day. Skin that looks thin on your ankle and leg. Red or brown marks that develop slowly. Skin that is dry, cracked, or easily irritated. Red, swollen skin that is sore or has a burning  feeling. An achy or heavy feeling after you walk or stand for long periods of time. Pain. Later and more severe symptoms of this condition include: Skin that looks shiny. Small, open sores (ulcers). These are often red or purple and leak fluid. Skin that feels hard. Severe itching. A change in the shape or color of your lower legs. Severe pain. Difficulty walking. How is this diagnosed? This condition may be diagnosed based on: Your symptoms and medical history. A physical exam. You may also have tests, including: Blood tests. Imaging tests to check blood flow (Doppler ultrasound). Allergy tests. You may need to see a health care provider who specializes in skin diseases (dermatologist). How is this treated? This condition may be treated with: Compression stockings or an elastic wrap to improve circulation. Medicines, such as: Corticosteroid creams and ointments. Non-corticosteroid medicines applied to the skin (topical). Medicine to reduce swelling in the legs (diuretics). Antibiotics. Medicine to relieve itching (antihistamines). A bandage (dressing). A wrap that contains zinc and gelatin (Unna boot). Follow these instructions at home: Skin care Moisturize your skin as told by your health care provider. Do not use moisturizers with fragrance. This can irritate your skin. Apply a cool, wet cloth (cool compress) to the affected areas. Do not scratch your skin. Do not rub your skin dry after a bath or shower. Gently pat your skin dry. Do not use scented soaps, detergents, or perfumes. Medicines Take or use over-the-counter and prescription medicines only as told by your health care provider. If you were prescribed  an antibiotic medicine, take or use it as told by your health care provider. Do not stop taking or using the antibiotic even if your condition improves. Activity Walk as told by your health care provider. Walking increases blood flow. Do calf and ankle exercises  throughout the day as told by your health care provider. This will help increase blood flow. Raise (elevate) your legs above the level of your heart when you are sitting or lying down. Lifestyle Work with your health care provider to lose weight, if needed. Do not cross your legs when you sit. Do not stand or sit in one position for long periods of time. Wear comfortable, loose-fitting clothing. Circulation in your legs will be worse if you wear tight pants, belts, and waistbands. Do not use any products that contain nicotine or tobacco, such as cigarettes, e-cigarettes, and chewing tobacco. If you need help quitting, ask your health care provider. General instructions If you were asked to use one of the following to help with your condition, follow instructions from your health care provider on how to: Remove and change any dressing. Wear compression stockings. These stockings help to prevent blood clots and reduce swelling in your legs. Wear the [employer]. Keep all follow-up visits as told by your health care provider. This is important. Contact a health care provider if: Your condition does not improve with treatment. Your condition gets worse. You have signs of infection in the affected area. Watch for: Swelling. Tenderness. Redness. Soreness. Warmth. You have a fever. Get help right away if: You notice red streaks coming from the affected area. Your bone or joint underneath the affected area becomes painful after the skin has healed. The affected area turns darker. You feel a deep pain in your leg or groin. You are short of breath. Summary Stasis dermatitis is a long-term (chronic) skin condition that happens when veins can no longer pump blood back to the heart (poor circulation). Wear compression stockings as told by your health care provider. These stockings help to prevent blood clots and reduce swelling in your legs. Follow instructions from your health care provider about  activity, medicines, and lifestyle. Contact a health care provider if you have a fever or have signs of infection in the affected area. Keep all follow-up visits as told by your health care provider. This is important. This information is not intended to replace advice given to you by your health care provider. Make sure you discuss any questions you have with your health care provider. Document Revised: 03/21/2020 Document Reviewed: 03/21/2020 Elsevier Patient Education  [place].

## 2021-08-12 LAB — BASIC METABOLIC PANEL WITH GFR
BUN/Creatinine Ratio: 15 (calc) (ref 6–22)
BUN: 16 mg/dL (ref 7–25)
CO2: 24 mmol/L (ref 20–32)
Calcium: 9.3 mg/dL (ref 8.6–10.4)
Chloride: 101 mmol/L (ref 98–110)
Creat: 1.04 mg/dL — ABNORMAL HIGH (ref 0.60–0.95)
Glucose, Bld: 106 mg/dL — ABNORMAL HIGH (ref 65–99)
Potassium: 4.6 mmol/L (ref 3.5–5.3)
Sodium: 139 mmol/L (ref 135–146)
eGFR: 51 mL/min/{1.73_m2} — ABNORMAL LOW (ref >=60)

## 2021-08-12 LAB — MICROALBUMIN / CREATININE URINE RATIO
Creatinine, Urine: 40 mg/dL (ref 20–275)
Microalb Creat Ratio: 28 mcg/mg creat (ref <30)
Microalb, Ur: 1.1 mg/dL

## 2021-08-13 ENCOUNTER — Encounter: Payer: Self-pay | Admitting: Family Medicine

## 2021-08-13 DIAGNOSIS — I4891 Unspecified atrial fibrillation: Secondary | ICD-10-CM | POA: Insufficient documentation

## 2021-09-20 DIAGNOSIS — Z961 Presence of intraocular lens: Secondary | ICD-10-CM | POA: Diagnosis not present

## 2021-09-20 DIAGNOSIS — H5202 Hypermetropia, left eye: Secondary | ICD-10-CM | POA: Diagnosis not present

## 2021-09-20 DIAGNOSIS — H353132 Nonexudative age-related macular degeneration, bilateral, intermediate dry stage: Secondary | ICD-10-CM | POA: Diagnosis not present

## 2021-10-24 ENCOUNTER — Ambulatory Visit (INDEPENDENT_AMBULATORY_CARE_PROVIDER_SITE_OTHER): Payer: Medicare Other | Admitting: Family Medicine

## 2021-10-24 ENCOUNTER — Encounter: Payer: Self-pay | Admitting: Family Medicine

## 2021-10-24 VITALS — BP 128/80 | HR 105 | Temp 98.3°F | Resp 12 | Ht 60.0 in | Wt 101.1 lb

## 2021-10-24 DIAGNOSIS — N1831 Chronic kidney disease, stage 3a: Secondary | ICD-10-CM | POA: Diagnosis not present

## 2021-10-24 DIAGNOSIS — I872 Venous insufficiency (chronic) (peripheral): Secondary | ICD-10-CM

## 2021-10-24 DIAGNOSIS — M353 Polymyalgia rheumatica: Secondary | ICD-10-CM

## 2021-10-24 DIAGNOSIS — R34 Anuria and oliguria: Secondary | ICD-10-CM

## 2021-10-24 DIAGNOSIS — I4891 Unspecified atrial fibrillation: Secondary | ICD-10-CM

## 2021-10-24 DIAGNOSIS — D510 Vitamin B12 deficiency anemia due to intrinsic factor deficiency: Secondary | ICD-10-CM

## 2021-10-24 DIAGNOSIS — I10 Essential (primary) hypertension: Secondary | ICD-10-CM | POA: Diagnosis not present

## 2021-10-24 DIAGNOSIS — R0602 Shortness of breath: Secondary | ICD-10-CM | POA: Diagnosis not present

## 2021-10-24 NOTE — Assessment & Plan Note (Signed)
Recommend continue same dose of Prednisone, she has taken medication for several years.  LE's joint pain can also be caused by OA. Fall precautions discussed.

## 2021-10-24 NOTE — Assessment & Plan Note (Signed)
Continue B12 supplementation. Reviewed prior CBC, which have been in normal range.

## 2021-10-24 NOTE — Assessment & Plan Note (Signed)
BP adequately controlled. Continue non pharmacologic treatment. 

## 2021-10-24 NOTE — Assessment & Plan Note (Signed)
There is a blister above medial left malleolus. No signs of infection. Avoid trauma. She cannot tolerate compression stocking. LE elevation a few times during the day. She can take Furosemide 20 mg for 3 days then daily prn to help with edema,

## 2021-10-24 NOTE — Assessment & Plan Note (Signed)
We discussed dx criteria,prognosis, and treatment. Low salt diet and adequate hydration to continue. Further recommendations according to BMP result.

## 2021-10-24 NOTE — Assessment & Plan Note (Signed)
She is not on anticoagulation, has declined Eliquis. Last EKG's she has done showed irregular HR with PAC's.  She is not having palpitations or chest discomfort. Instructed about warning signs.

## 2021-10-24 NOTE — Patient Instructions (Signed)
A few things to remember from today's visit:   POLYMYALGIA RHEUMATICA  Stage 3a chronic kidney disease ([place]) - Plan: CBC, Basic metabolic panel  Decreased urine output - Plan: CBC, Urinalysis with Culture Reflex  Essential hypertension  Venous stasis dermatitis of both lower extremities  You can take Furosemide 20 mg for the next 3 days to help with edema of legs, then back to as needed. Elevation will also help. Lungs are clear today. If shortness of breath happens again, we need to discuss further testing , heart and lungs can cause shortness of breath. No changes in Prednisone.  If you need refills for medications you take chronically, please call your pharmacy. Do not use My Chart to request refills or for acute issues that need immediate attention. If you send a my chart message, it may take a few days to be addressed, specially if I am not in the office.  Please be sure medication list is accurate. If a new problem present, please set up appointment sooner than planned today.

## 2021-10-24 NOTE — Progress Notes (Unsigned)
HPI: [name] is a 88 y.o. female with hx of CKD III, PMR on Prednisone, and HTN here today for follow up. She is alone, her daughter could not come today. Today she is c/o SOB, noted when she woke "a week or so", exacerbated by some activities, states that it is "not bad" and feeling better today. Denies fever,chills,changes in appetite, fatigue, cough, or wheezing. + Rhinorrhea,nasal congestion, and post nasal drainage. Slightly worse after decreasing dose of Benadryl from 25 mg to 12.5 mg daily.  She is also c/o urinary urgency but she is not urinating a lot. She has not noted gross hematuria, foam in urine,or dysuria. She is concerned about renal function, CKD IIIa and anemia. Last visit she was c/o urinary frequency.  Pernicious anemia, she is on sublingual B12. Lab Results  Component Value Date   VITAMINB12 935 12/07/2019   Lab Results  Component Value Date   WBC 10.1 04/24/2021   HGB 14.0 04/24/2021   HCT 41.5 04/24/2021   MCV 97.2 04/24/2021   PLT 255.0 04/24/2021   HTN: She is on non pharmacologic treatment. Hx of atrial fib. Last cardiologist visit 10/2020, irregular heart rhythm, PAC seen on last few EKG's. SHe is not on anticoagulation, she declined  treatment with Eliquis. She has not noted CP or palpitations. Negative for orthopnea or PND. LE edema, she takes Furosemide 20 mg daily, she has not taken it for the past few days.  Stasis dermatitis, L>R, very sensitive to touch and blisters. She has been evaluated by wound clinic for venous ulcers.  Lab Results  Component Value Date   CREATININE 1.04 (H) 08/11/2021   BUN 16 08/11/2021   NA 139 08/11/2021   K 4.6 08/11/2021   CL 101 08/11/2021   CO2 24 08/11/2021   PMR: She is on Prednisone 2.5 mg bid, has taken it for years. She wonders why she is still having balance issues and arthralgias; and if she can stop Prednisone. Otherwise stable. Unstable gait, she uses a cane. No falls. Lab Results   Component Value Date   ESRSEDRATE 29 04/19/2021   Lab Results  Component Value Date   CRP 12.6 04/19/2021   She is still active with chores around her house, she cleans her bathroom, cooks,and vacuums. She is drinking water in the morning and a glass of wine at night.  Review of Systems  Constitutional:  Negative for activity change and appetite change.  HENT:  Negative for mouth sores, nosebleeds and trouble swallowing.   Gastrointestinal:  Negative for abdominal pain, nausea and vomiting.       Negative for changes in bowel habits.  Endocrine: Negative for cold intolerance and heat intolerance.  Neurological:  Negative for syncope, weakness and headaches.  Psychiatric/Behavioral:  Negative for confusion and hallucinations.   Rest see pertinent positives and negatives per HPI.  Current Outpatient Medications on File Prior to Visit  Medication Sig Dispense Refill   CALCIUM PO Take 1,300 mg by mouth daily.     Cyanocobalamin (VITAMIN B-12) 1000 MCG SUBL Place 1,000 mcg under the tongue daily.      diphenhydrAMINE (BENADRYL) 25 MG tablet Take 25 mg by mouth at bedtime as needed for sleep.     furosemide (LASIX) 20 MG tablet Take 1 tablet (20 mg total) by mouth daily as needed. 90 tablet 1   Oyster Shell (OYSTER CALCIUM) 500 MG TABS tablet Take 1,000 mg of elemental calcium by mouth daily.     predniSONE (DELTASONE)  2.5 MG tablet Take 1-2 tablets (2.5-5 mg total) by mouth daily with breakfast. 180 tablet 3   vitamin C (ASCORBIC ACID) 250 MG tablet Take 500 mg by mouth daily.     No current facility-administered medications on file prior to visit.   Past Medical History:  Diagnosis Date   Alcohol abuse    Emphysema of lung (HCC) 01/26/2009   pt. unsure of this reports as of 07/31/2012   Fracture of fifth metatarsal bone of right foot 08/29/2012   Gout 08/11/2012   Hyperlipemia 03/27/2007   Hypertension    Osteoporosis 07/12/2006   Pernicious anemia 10/25/2006   Polymyalgia rheumatica  (HCC) 03/27/2007   sees Dr. Kellie Simmering in rheumatology   Vitamin D deficiency 07/12/2006   Allergies  Allergen Reactions   Naproxen Other (See Comments)    Severe pain in joints and hands   Bactrim [Sulfamethoxazole-Trimethoprim]     She had episode of confusion after starting bactrim; uncertain if related. Got dehydrated, ARF.    Doxycycline Other (See Comments)    Burning sensation noted in the tongue and vaginal irritation   Aspirin Other (See Comments)    "threw up blood"   Codeine Other (See Comments)    "threw up blood"   Social History   Socioeconomic History   Marital status: Married    Spouse name: Not on file   Number of children: 2   Years of education: Not on file   Highest education level: Not on file  Occupational History   Not on file  Tobacco Use   Smoking status: Former    Packs/day: 2.00    Years: 58.00    Total pack years: 116.00    Types: Cigarettes    Start date: 01/23/1948    Quit date: 07/12/2006    Years since quitting: 15.2   Smokeless tobacco: Never  Vaping Use   Vaping Use: Never used  Substance and Sexual Activity   Alcohol use: No    Alcohol/week: 10.0 standard drinks of alcohol    Types: 10 Shots of liquor per week    Comment: quit completely - Jun 07, 2010   Drug use: No   Sexual activity: Never  Other Topics Concern   Not on file  Social History Narrative   Work or School: none      Home Situation: lives with husband - drives, husband takes care of finances, dresses herself, takes care of housework and cooking      Spiritual Beliefs: [employer]      Lifestyle: uses cane when goes out, exercises sometimes - diet healthy      03/20/2018: Lives alone in one-level townhouse with dog. Husband passed away in fall of 06/06/2017. Coping well overall.   Has two daughters, both of whom live close by and are supportive.   Active in church   Enjoys gardening         Social Determinants of Health   Financial Resource Strain: Low Risk  (07/28/2021)    Overall Financial Resource Strain (CARDIA)    Difficulty of Paying Living Expenses: Not hard at all  Food Insecurity: No Food Insecurity (07/28/2021)   Hunger Vital Sign    Worried About Running Out of Food in the Last Year: Never true    Ran Out of Food in the Last Year: Never true  Transportation Needs: No Transportation Needs (07/28/2021)   PRAPARE - Administrator, Civil Service (Medical): No    Lack of Transportation (Non-Medical): No  Physical  Activity: Inactive (07/28/2021)   Exercise Vital Sign    Days of Exercise per Week: 0 days    Minutes of Exercise per Session: 0 min  Stress: No Stress Concern Present (07/28/2021)   [employer] of Occupational Health - Occupational Stress Questionnaire    Feeling of Stress : Not at all  Social Connections: Moderately Integrated (07/28/2021)   Social Connection and Isolation Panel [NHANES]    Frequency of Communication with Friends and Family: More than three times a week    Frequency of Social Gatherings with Friends and Family: More than three times a week    Attends Religious Services: More than 4 times per year    Active Member of [employer] or Organizations: Yes    Attends Banker Meetings: More than 4 times per year    Marital Status: Widowed   Vitals:   10/24/21 1418 10/24/21 1515  BP: 128/80   Pulse: (!) 110 (!) 105  Resp: 12   Temp: 98.3 F (36.8 C)   SpO2: 98%    Wt Readings from Last 3 Encounters:  10/24/21 101 lb 2 oz (45.9 kg)  08/11/21 101 lb 6 oz (46 kg)  07/28/21 103 lb (46.7 kg)  Body mass index is 19.75 kg/m.  Physical Exam Vitals and nursing note reviewed.  Constitutional:      General: She is not in acute distress.    Appearance: She is well-developed.  HENT:     Head: Normocephalic and atraumatic.     Mouth/Throat:     Mouth: Mucous membranes are moist.  Eyes:     Conjunctiva/sclera: Conjunctivae normal.  Cardiovascular:     Rate and Rhythm: Tachycardia present. Rhythm irregular.      Pulses:          Dorsalis pedis pulses are 2+ on the right side and 2+ on the left side.     Heart sounds: No murmur heard.    Comments: Pitting pedal edema, L>R, Venouse stasis dermattis changes. Bullae above medial left malleolus. I do  not appreciate erythema or exudate. DP pulses are palpable. Pulmonary:     Effort: Pulmonary effort is normal. No respiratory distress.     Breath sounds: Normal breath sounds.  Abdominal:     Palpations: Abdomen is soft. There is no mass.     Tenderness: There is no abdominal tenderness.  Skin:    General: Skin is warm.     Findings: No erythema or rash.  Neurological:     General: No focal deficit present.     Mental Status: She is alert and oriented to person, place, and time.     Cranial Nerves: No cranial nerve deficit.     Gait: Gait normal.     Comments: Unstable gait, assisted with a cane.  Psychiatric:        Mood and Affect: Mood is anxious.   ASSESSMENT AND PLAN:  [name] was seen today for follow-up.  Diagnoses and all orders for this visit: Orders Placed This Encounter  Procedures   CBC   Basic metabolic panel   Urinalysis with Culture Reflex   SOB (shortness of breath) Intermittently for a week or so. Today she has not had problem, lung auscultation negative. She is not having other associated symptoms. We do not have X ray service today. She has had abnormal CXR. Chest CT in 05/2021 showed mild emphysematous changes,  tree-in-bud opacities in the right middle lobe and minimally in the right upper lobe and lung nodules.  She decided to hold on CXR and will call back if SOB reoccurs.  Decreased urine output We discussed possible causes. Increase fluid intake. Instructed about warning signs. Today UA,BMP,and CBC ordered.  POLYMYALGIA RHEUMATICA Recommend continue same dose of Prednisone, she has taken medication for several years.  LE's joint pain can also be caused by OA. Fall precautions discussed.  Pernicious  anemia Continue B12 supplementation. Reviewed prior CBC, which have been in normal range.  CKD (chronic kidney disease), stage III ([place]) We discussed dx criteria,prognosis, and treatment. Low salt diet and adequate hydration to continue. Further recommendations according to BMP result.   Atrial fibrillation, unspecified type ([place]) She is not on anticoagulation, has declined Eliquis. Last EKG's she has done showed irregular HR with PAC's.  She is not having palpitations or chest discomfort. Instructed about warning signs.  Essential hypertension BP adequately controlled. Continue non pharmacologic treatment.  Venous stasis dermatitis of both lower extremities There is a blister above medial left malleolus. No signs of infection. Avoid trauma. She cannot tolerate compression stocking. LE elevation a few times during the day. She can take Furosemide 20 mg for 3 days then daily prn to help with edema,  I spent a total of 49 minutes in both face to face and non face to face activities for this visit on the date of this encounter. During this time history was obtained and documented, examination was performed, prior labs/imaging reviewed, and assessment/plan discussed.  Return in about 6 months (around 04/25/2022) for Before depending of lab results..  [name] Shingledecker G. [place], MD  [facility]. [place] office.

## 2021-10-25 ENCOUNTER — Other Ambulatory Visit: Payer: Self-pay

## 2021-10-25 LAB — BASIC METABOLIC PANEL
BUN: 15 mg/dL (ref 6–23)
CO2: 30 mEq/L (ref 19–32)
Calcium: 9.4 mg/dL (ref 8.4–10.5)
Chloride: 102 mEq/L (ref 96–112)
Creatinine, Ser: 1.08 mg/dL (ref 0.40–1.20)
GFR: 44.9 mL/min — ABNORMAL LOW (ref >60.00)
Glucose, Bld: 84 mg/dL (ref 70–99)
Potassium: 4.3 mEq/L (ref 3.5–5.1)
Sodium: 140 mEq/L (ref 135–145)

## 2021-10-25 LAB — CBC
HCT: 42.7 % (ref 36.0–46.0)
Hemoglobin: 14.3 g/dL (ref 12.0–15.0)
MCHC: 33.5 g/dL (ref 30.0–36.0)
MCV: 96.7 fl (ref 78.0–100.0)
Platelets: 225 10*3/uL (ref 150.0–400.0)
RBC: 4.42 Mil/uL (ref 3.87–5.11)
RDW: 14.5 % (ref 11.5–15.5)
WBC: 11.6 10*3/uL — ABNORMAL HIGH (ref 4.0–10.5)

## 2021-10-25 MED ORDER — AMOXICILLIN-POT CLAVULANATE 500-125 MG PO TABS: 1.0000 | ORAL_TABLET | Freq: Two times a day (BID) | ORAL | 0 refills | Status: AC

## 2021-10-27 LAB — URINALYSIS W MICROSCOPIC + REFLEX CULTURE
Bacteria, UA: NONE SEEN /HPF
Bilirubin Urine: NEGATIVE
Glucose, UA: NEGATIVE
Ketones, ur: NEGATIVE
Nitrites, Initial: NEGATIVE
Protein, ur: NEGATIVE
Specific Gravity, Urine: 1.013 (ref 1.001–1.035)
WBC, UA: >=60 /HPF — AB (ref 0–5)
pH: 6 (ref 5.0–8.0)

## 2021-10-27 LAB — URINE CULTURE
MICRO NUMBER:: 14004528
SPECIMEN QUALITY:: ADEQUATE

## 2021-10-27 LAB — CULTURE INDICATED

## 2021-10-30 ENCOUNTER — Ambulatory Visit: Payer: Medicare Other | Admitting: Family Medicine

## 2021-12-20 ENCOUNTER — Telehealth: Payer: Self-pay | Admitting: Family Medicine

## 2021-12-20 NOTE — Telephone Encounter (Signed)
Patient concerned as to whether she is up to date on her immunizations and requests a call

## 2021-12-20 NOTE — Telephone Encounter (Signed)
I called and spoke with patient. She has not received her flu vaccine for this year yet, she will go by the pharmacy to get that. She will hold on the covid vaccine for now.

## 2022-01-17 ENCOUNTER — Encounter: Payer: Self-pay | Admitting: *Deleted

## 2022-01-17 ENCOUNTER — Telehealth: Payer: Self-pay | Admitting: *Deleted

## 2022-01-17 NOTE — Patient Outreach (Signed)
  Care Coordination   Initial Visit Note   01/17/2022 Name: [name] MRN: [id number] DOB: 1928/06/18  [name] is a 88 y.o. year old female who sees [place], Timoteo Expose, MD for primary care. I  spoke with daughter Keith Rake today.  What matters to the patients health and wellness today?  No needs     Goals Addressed             This Visit's Progress    COMPLETED: Care coordination activity       Care Coordination Interventions:  Reviewed medications with patient and discussed adherence with no needed refills Reviewed scheduled/upcoming provider appointments including sufficient transportation source Assessed social determinant of health barriers Education on care management services related to social worker, pharmacy and nurse care Production designer, theatre/television/film. Also discuss community resources related to adult daycare, PACE, and available physical therapy services for improving pt's mobility.         SDOH assessments and interventions completed:  Yes  SDOH Interventions Today    Flowsheet Row Most Recent Value  SDOH Interventions   Food Insecurity Interventions Intervention Not Indicated  Housing Interventions Intervention Not Indicated  Transportation Interventions Intervention Not Indicated  Utilities Interventions Intervention Not Indicated        Care Coordination Interventions:  Yes, provided   Follow up plan: No further intervention required.   Encounter Outcome:  Pt. Visit Completed   Elliot Cousin, RN Care Management Coordinator [facility] Main Office [phone]

## 2022-01-17 NOTE — Patient Instructions (Signed)
Visit Information  Thank you for taking time to visit with me today. Please don't hesitate to contact me if I can be of assistance to you.   Following are the goals we discussed today:   Goals Addressed             This Visit's Progress    COMPLETED: Care coordination activity       Care Coordination Interventions:  Reviewed medications with patient and discussed adherence with no needed refills Reviewed scheduled/upcoming provider appointments including sufficient transportation source Assessed social determinant of health barriers Education on care management services related to social worker, pharmacy and nurse care Production designer, theatre/television/film. Also discuss community resources related to adult daycare, PACE, and available physical therapy services for improving pt's mobility.         Please call the care guide team at [phone] if you need to cancel or reschedule your appointment.   If you are experiencing a Mental Health or Behavioral Health Crisis or need someone to talk to, please call the Suicide and Crisis Lifeline: 988  Patient verbalizes understanding of instructions and care plan provided today and agrees to view in MyChart. Active MyChart status and patient understanding of how to access instructions and care plan via MyChart confirmed with patient.     No further follow up required: No follow up needs  Elliot Cousin, RN Care Management Coordinator [facility] Main Office [phone]

## 2022-04-12 ENCOUNTER — Telehealth: Payer: Self-pay | Admitting: Family Medicine

## 2022-04-12 DIAGNOSIS — Z7189 Other specified counseling: Secondary | ICD-10-CM

## 2022-04-12 NOTE — Telephone Encounter (Addendum)
[name] with [facility] is calling and need a hearing evaluation and has hearing aids. . Pt has [place]. Pt has an appt on 04-26-2022

## 2022-04-13 NOTE — Telephone Encounter (Signed)
Referral placed.

## 2022-04-23 ENCOUNTER — Other Ambulatory Visit: Payer: Self-pay

## 2022-04-23 ENCOUNTER — Other Ambulatory Visit: Payer: Self-pay | Admitting: *Deleted

## 2022-04-23 DIAGNOSIS — M353 Polymyalgia rheumatica: Secondary | ICD-10-CM

## 2022-04-23 MED ORDER — PREDNISONE 2.5 MG PO TABS: 2.5000 mg | ORAL_TABLET | Freq: Every day | ORAL | 3 refills | Status: DC

## 2022-04-23 MED ORDER — FUROSEMIDE 20 MG PO TABS: 20.0000 mg | ORAL_TABLET | Freq: Every day | ORAL | 1 refills | Status: DC | PRN

## 2022-04-26 DIAGNOSIS — H903 Sensorineural hearing loss, bilateral: Secondary | ICD-10-CM | POA: Diagnosis not present

## 2022-05-22 DIAGNOSIS — S8012XA Contusion of left lower leg, initial encounter: Secondary | ICD-10-CM | POA: Diagnosis not present

## 2022-05-25 DIAGNOSIS — S8010XA Contusion of unspecified lower leg, initial encounter: Secondary | ICD-10-CM | POA: Diagnosis not present

## 2022-05-30 ENCOUNTER — Ambulatory Visit (INDEPENDENT_AMBULATORY_CARE_PROVIDER_SITE_OTHER): Payer: Medicare Other | Admitting: Family Medicine

## 2022-05-30 ENCOUNTER — Encounter: Payer: Self-pay | Admitting: Family Medicine

## 2022-05-30 VITALS — BP 112/70 | HR 74 | Temp 98.5°F | Wt 91.4 lb

## 2022-05-30 DIAGNOSIS — T148XXA Other injury of unspecified body region, initial encounter: Secondary | ICD-10-CM

## 2022-05-30 DIAGNOSIS — E43 Unspecified severe protein-calorie malnutrition: Secondary | ICD-10-CM

## 2022-05-30 NOTE — Progress Notes (Signed)
Established Patient Office Visit   Subjective  Patient ID: [name], female    DOB: 10-15-1928  Age: 89 y.o. MRN: [id number]  Chief Complaint  Patient presents with   Bleeding/Bruising    Bumped side of lower left leg, is now black, has spread  around to back fo leg. Back of leg is red, very painful yesterday and this morning, some stiffness. Did have some redness at the ankle. Hit leg on end table leg   Pt accompanied by her daughter.  Pt is a 89 yo female followed by Dr. Swazil[place].  Pt bumped leg on table at home 1 wk ago.  Seen at [facility].  Bruising, but no open wound noted.  Pt presents today as concerned about discoloration spreading.  Leg sore.  No drainage, increased warmth.  Not on blood thinners.    Past Medical History:  Diagnosis Date   Alcohol abuse    Emphysema of lung (HCC) 01/26/2009   pt. unsure of this reports as of 07/31/2012   Fracture of fifth metatarsal bone of right foot 08/29/2012   Gout 08/11/2012   Hyperlipemia 03/27/2007   Hypertension    Osteoporosis 07/12/2006   Pernicious anemia 10/25/2006   Polymyalgia rheumatica (HCC) 03/27/2007   sees Dr. Kellie Simmering in rheumatology   Vitamin D deficiency 07/12/2006   Past Surgical History:  Procedure Laterality Date   APPENDECTOMY  07/12/2006   LUMBAR LAMINECTOMY/DECOMPRESSION MICRODISCECTOMY Right 02/13/2017   Procedure: Microdiscectomy - right - Lumbar three-Lumbar four;  Surgeon: Tia Alert, MD;  Location: [facility] OR;  Service: Neurosurgery;  Laterality: Right;   PARTIAL HIP ARTHROPLASTY     left side- 2x's ( 2nd surgery to correct 1st replacement), R side -    TOOTH EXTRACTION N/A 08/04/2012   Procedure: EXTRACTIONS 17, 20;  Surgeon: Georgia Lopes, DDS;  Location: [facility] OR;  Service: Oral Surgery;  Laterality: N/A;   Family History  Problem Relation Age of Onset   Mental illness Mother        alzheimer dementia   Ulcers Father    Heart disease Father    Cancer Father        lung, smoker   Allergies  Allergen  Reactions   Naproxen Other (See Comments)    Severe pain in joints and hands   Bactrim [Sulfamethoxazole-Trimethoprim]     She had episode of confusion after starting bactrim; uncertain if related. Got dehydrated, ARF.    Doxycycline Other (See Comments)    Burning sensation noted in the tongue and vaginal irritation   Aspirin Other (See Comments)    "threw up blood"   Codeine Other (See Comments)    "threw up blood"      ROS Negative unless stated above    Objective:     BP 112/70 (BP Location: Left Arm, Patient Position: Sitting, Cuff Size: Normal)   Pulse 74   Temp 98.5 F (36.9 C) (Oral)   Wt 91 lb 6.4 oz (41.5 kg)   SpO2 96%   BMI 17.85 kg/m  Wt Readings from Last 3 Encounters:  05/30/22 91 lb 6.4 oz (41.5 kg)  10/24/21 101 lb 2 oz (45.9 kg)  08/11/21 101 lb 6 oz (46 kg)      Physical Exam Constitutional:      General: She is not in acute distress.    Appearance: Normal appearance.  HENT:     Head: Normocephalic and atraumatic.     Nose: Nose normal.     Mouth/Throat:  Mouth: Mucous membranes are moist.  Cardiovascular:     Rate and Rhythm: Normal rate and regular rhythm.     Heart sounds: Normal heart sounds. No murmur heard.    No gallop.  Pulmonary:     Effort: Pulmonary effort is normal. No respiratory distress.     Breath sounds: Normal breath sounds. No wheezing, rhonchi or rales.  Skin:    General: Skin is warm and dry.     Findings: Bruising present.          Comments: L lower leg with large area of Hematoma.  No increased warmth, erythema, drainage.    Neurological:     Mental Status: She is alert and oriented to person, place, and time.      No results found for any visits on 05/30/22.    Assessment & Plan:  Hematoma  Severe malnutrition (HCC)   Large hematoma on LLE.  Change in appearance due to gravity.  Continue supportive care including heat, elevation, etc.  Discussed possible duration of symptoms.    Weight loss noted.    Body mass index is 17.85 kg/m.   Discussed supplements and encouraging pt to eat throughout the day.  Close f/u with PCP advised.  Return in about 3 weeks (around 06/20/2022) for wt check.   Deeann Saint, MD

## 2022-05-30 NOTE — Patient Instructions (Signed)
Wt Readings from Last 3 Encounters:  05/30/22 91 lb 6.4 oz (41.5 kg)  10/24/21 101 lb 2 oz (45.9 kg)  08/11/21 101 lb 6 oz (46 kg)

## 2022-06-06 ENCOUNTER — Encounter: Payer: Self-pay | Admitting: Internal Medicine

## 2022-06-06 ENCOUNTER — Ambulatory Visit (INDEPENDENT_AMBULATORY_CARE_PROVIDER_SITE_OTHER): Payer: Medicare Other | Admitting: Internal Medicine

## 2022-06-06 VITALS — BP 124/80 | HR 64 | Temp 98.2°F | Wt 97.3 lb

## 2022-06-06 DIAGNOSIS — S8012XA Contusion of left lower leg, initial encounter: Secondary | ICD-10-CM | POA: Diagnosis not present

## 2022-06-06 NOTE — Progress Notes (Signed)
Established Patient Office Visit     CC/Reason for Visit: left leg bruise  HPI: [name] is a 89 y.o. female who is coming in today for the above mentioned reasons. About 2 weeks ago hit her left lower leg against a side table.Has developed a blood blister. Had some surrounding erythema but that has resolved. No fever/chills or malaise.   Past Medical/Surgical History: Past Medical History:  Diagnosis Date   Alcohol abuse    Emphysema of lung (HCC) 01/26/2009   pt. unsure of this reports as of 07/31/2012   Fracture of fifth metatarsal bone of right foot 08/29/2012   Gout 08/11/2012   Hyperlipemia 03/27/2007   Hypertension    Osteoporosis 07/12/2006   Pernicious anemia 10/25/2006   Polymyalgia rheumatica (HCC) 03/27/2007   sees Dr. Kellie Simmering in rheumatology   Vitamin D deficiency 07/12/2006    Past Surgical History:  Procedure Laterality Date   APPENDECTOMY  07/12/2006   LUMBAR LAMINECTOMY/DECOMPRESSION MICRODISCECTOMY Right 02/13/2017   Procedure: Microdiscectomy - right - Lumbar three-Lumbar four;  Surgeon: Tia Alert, MD;  Location: [facility] OR;  Service: Neurosurgery;  Laterality: Right;   PARTIAL HIP ARTHROPLASTY     left side- 2x's ( 2nd surgery to correct 1st replacement), R side -    TOOTH EXTRACTION N/A 08/04/2012   Procedure: EXTRACTIONS 17, 20;  Surgeon: Georgia Lopes, DDS;  Location: [facility] OR;  Service: Oral Surgery;  Laterality: N/A;    Social History:  reports that she quit smoking about 15 years ago. Her smoking use included cigarettes. She started smoking about 74 years ago. She has a 116.00 pack-year smoking history. She has never used smokeless tobacco. She reports that she does not drink alcohol and does not use drugs.  Allergies: Allergies  Allergen Reactions   Naproxen Other (See Comments)    Severe pain in joints and hands   Bactrim [Sulfamethoxazole-Trimethoprim]     She had episode of confusion after starting bactrim; uncertain if related. Got  dehydrated, ARF.    Doxycycline Other (See Comments)    Burning sensation noted in the tongue and vaginal irritation   Aspirin Other (See Comments)    "threw up blood"   Codeine Other (See Comments)    "threw up blood"    Family History:  Family History  Problem Relation Age of Onset   Mental illness Mother        alzheimer dementia   Ulcers Father    Heart disease Father    Cancer Father        lung, smoker     Current Outpatient Medications:    CALCIUM PO, Take 1,300 mg by mouth daily., Disp: , Rfl:    Cyanocobalamin (VITAMIN B-12) 1000 MCG SUBL, Place 1,000 mcg under the tongue daily. , Disp: , Rfl:    diphenhydrAMINE (BENADRYL) 25 MG tablet, Take 25 mg by mouth at bedtime as needed for sleep., Disp: , Rfl:    furosemide (LASIX) 20 MG tablet, Take 1 tablet (20 mg total) by mouth daily as needed., Disp: 90 tablet, Rfl: 1   Oyster Shell (OYSTER CALCIUM) 500 MG TABS tablet, Take 1,000 mg of elemental calcium by mouth daily., Disp: , Rfl:    predniSONE (DELTASONE) 2.5 MG tablet, Take 1-2 tablets (2.5-5 mg total) by mouth daily with breakfast., Disp: 180 tablet, Rfl: 3   vitamin C (ASCORBIC ACID) 250 MG tablet, Take 500 mg by mouth daily., Disp: , Rfl:   Review of Systems:  Negative  unless indicated in HPI.   Physical Exam: Vitals:   06/06/22 1601  BP: 124/80  Pulse: 64  Temp: 98.2 F (36.8 C)  TempSrc: Oral  SpO2: 98%  Weight: 97 lb 4.8 oz (44.1 kg)    Body mass index is 19 kg/m.   Physical Exam Vitals reviewed.  Constitutional:      Appearance: Normal appearance.  HENT:     Head: Normocephalic and atraumatic.  Eyes:     Conjunctiva/sclera: Conjunctivae normal.     Pupils: Pupils are equal, round, and reactive to light.  Musculoskeletal:     Comments: Large hematoma left lower leg with blood blister.  Skin:    General: Skin is warm and dry.  Neurological:     General: No focal deficit present.     Mental Status: She is alert and oriented to person,  place, and time.  Psychiatric:        Mood and Affect: Mood normal.        Behavior: Behavior normal.        Thought Content: Thought content normal.        Judgment: Judgment normal.      Impression and Plan:  Hematoma of left lower leg  -advised rest and elevation. No current signs of infection. No need for xray.   Time spent:21 minutes spent on today's visit reviewing chart, interviewing and examining patient and formulating plan of care.     Chaya Jan, MD [facility]

## 2022-06-19 ENCOUNTER — Ambulatory Visit: Payer: Medicare Other | Admitting: Family Medicine

## 2022-07-01 ENCOUNTER — Other Ambulatory Visit: Payer: Self-pay | Admitting: Family Medicine

## 2022-07-16 NOTE — Progress Notes (Unsigned)
ACUTE VISIT No chief complaint on file.  HPI: [name] is a 89 y.o. female, who is here today complaining of *** HPI  Review of Systems See other pertinent positives and negatives in HPI.  Current Outpatient Medications on File Prior to Visit  Medication Sig Dispense Refill   CALCIUM PO Take 1,300 mg by mouth daily.     Cyanocobalamin (VITAMIN B-12) 1000 MCG SUBL Place 1,000 mcg under the tongue daily.      diphenhydrAMINE (BENADRYL) 25 MG tablet Take 25 mg by mouth at bedtime as needed for sleep.     furosemide (LASIX) 20 MG tablet TAKE 1 TABLET BY MOUTH DAILY AS  NEEDED 100 tablet 2   Oyster Shell (OYSTER CALCIUM) 500 MG TABS tablet Take 1,000 mg of elemental calcium by mouth daily.     predniSONE (DELTASONE) 2.5 MG tablet Take 1-2 tablets (2.5-5 mg total) by mouth daily with breakfast. 180 tablet 3   vitamin C (ASCORBIC ACID) 250 MG tablet Take 500 mg by mouth daily.     No current facility-administered medications on file prior to visit.    Past Medical History:  Diagnosis Date   Alcohol abuse    Emphysema of lung (HCC) 01/26/2009   pt. unsure of this reports as of 07/31/2012   Fracture of fifth metatarsal bone of right foot 08/29/2012   Gout 08/11/2012   Hyperlipemia 03/27/2007   Hypertension    Osteoporosis 07/12/2006   Pernicious anemia 10/25/2006   Polymyalgia rheumatica (HCC) 03/27/2007   sees Dr. Kellie Simmering in rheumatology   Vitamin D deficiency 07/12/2006   Allergies  Allergen Reactions   Naproxen Other (See Comments)    Severe pain in joints and hands   Bactrim [Sulfamethoxazole-Trimethoprim]     She had episode of confusion after starting bactrim; uncertain if related. Got dehydrated, ARF.    Doxycycline Other (See Comments)    Burning sensation noted in the tongue and vaginal irritation   Aspirin Other (See Comments)    "threw up blood"   Codeine Other (See Comments)    "threw up blood"    Social History   Socioeconomic History   Marital status:  Married    Spouse name: Not on file   Number of children: 2   Years of education: Not on file   Highest education level: Not on file  Occupational History   Not on file  Tobacco Use   Smoking status: Former    Packs/day: 2.00    Years: 58.00    Additional pack years: 0.00    Total pack years: 116.00    Types: Cigarettes    Start date: 01/23/1948    Quit date: 07/12/2006    Years since quitting: 16.0   Smokeless tobacco: Never  Vaping Use   Vaping Use: Never used  Substance and Sexual Activity   Alcohol use: No    Alcohol/week: 10.0 standard drinks of alcohol    Types: 10 Shots of liquor per week    Comment: quit completely - 2012   Drug use: No   Sexual activity: Never  Other Topics Concern   Not on file  Social History Narrative   Work or School: none      Home Situation: lives with husband - drives, husband takes care of finances, dresses herself, takes care of housework and cooking      Spiritual Beliefs: [employer]      Lifestyle: uses cane when goes out, exercises sometimes - diet healthy  03/20/2018: Lives alone in one-level townhouse with dog. Husband passed away in fall of 07-30-2017. Coping well overall.   Has two daughters, both of whom live close by and are supportive.   Active in church   Enjoys gardening         Social Determinants of Health   Financial Resource Strain: Low Risk  (07/28/2021)   Overall Financial Resource Strain (CARDIA)    Difficulty of Paying Living Expenses: Not hard at all  Food Insecurity: No Food Insecurity (01/17/2022)   Hunger Vital Sign    Worried About Running Out of Food in the Last Year: Never true    Ran Out of Food in the Last Year: Never true  Transportation Needs: No Transportation Needs (01/17/2022)   PRAPARE - Administrator, Civil Service (Medical): No    Lack of Transportation (Non-Medical): No  Physical Activity: Inactive (07/28/2021)   Exercise Vital Sign    Days of Exercise per Week: 0 days     Minutes of Exercise per Session: 0 min  Stress: No Stress Concern Present (07/28/2021)   [employer] of Occupational Health - Occupational Stress Questionnaire    Feeling of Stress : Not at all  Social Connections: Moderately Integrated (07/28/2021)   Social Connection and Isolation Panel [NHANES]    Frequency of Communication with Friends and Family: More than three times a week    Frequency of Social Gatherings with Friends and Family: More than three times a week    Attends Religious Services: More than 4 times per year    Active Member of [employer] or Organizations: Yes    Attends Banker Meetings: More than 4 times per year    Marital Status: Widowed    There were no vitals filed for this visit. There is no height or weight on file to calculate BMI.  Physical Exam  ASSESSMENT AND PLAN: There are no diagnoses linked to this encounter.  No follow-ups on file.  Sosha Shepherd G. [place], MD  [facility] [place] office.  Discharge Instructions   None

## 2022-07-17 ENCOUNTER — Telehealth: Payer: Self-pay | Admitting: Family Medicine

## 2022-07-17 ENCOUNTER — Encounter: Payer: Self-pay | Admitting: Family Medicine

## 2022-07-17 ENCOUNTER — Ambulatory Visit (INDEPENDENT_AMBULATORY_CARE_PROVIDER_SITE_OTHER): Payer: Medicare Other | Admitting: Family Medicine

## 2022-07-17 VITALS — BP 140/80 | HR 96 | Temp 98.2°F | Resp 16 | Ht 60.0 in | Wt 98.0 lb

## 2022-07-17 DIAGNOSIS — S81802D Unspecified open wound, left lower leg, subsequent encounter: Secondary | ICD-10-CM | POA: Diagnosis not present

## 2022-07-17 DIAGNOSIS — R918 Other nonspecific abnormal finding of lung field: Secondary | ICD-10-CM | POA: Diagnosis not present

## 2022-07-17 DIAGNOSIS — N1831 Chronic kidney disease, stage 3a: Secondary | ICD-10-CM

## 2022-07-17 DIAGNOSIS — R0602 Shortness of breath: Secondary | ICD-10-CM | POA: Diagnosis not present

## 2022-07-17 DIAGNOSIS — I10 Essential (primary) hypertension: Secondary | ICD-10-CM | POA: Diagnosis not present

## 2022-07-17 DIAGNOSIS — S81802A Unspecified open wound, left lower leg, initial encounter: Secondary | ICD-10-CM | POA: Insufficient documentation

## 2022-07-17 DIAGNOSIS — I4891 Unspecified atrial fibrillation: Secondary | ICD-10-CM | POA: Diagnosis not present

## 2022-07-17 MED ORDER — ALBUTEROL SULFATE HFA 108 (90 BASE) MCG/ACT IN AERS: 2.0000 | INHALATION_SPRAY | Freq: Four times a day (QID) | RESPIRATORY_TRACT | 0 refills | Status: DC | PRN

## 2022-07-17 MED ORDER — TRAMADOL HCL 50 MG PO TABS: 25.0000 mg | ORAL_TABLET | Freq: Two times a day (BID) | ORAL | 0 refills | Status: AC | PRN

## 2022-07-17 NOTE — Patient Instructions (Addendum)
A few things to remember from today's visit:  SOB (shortness of breath)  Wound of left lower extremity, subsequent encounter - Plan: Ambulatory referral to Wound Clinic Appt with wound clinic will be arranged. Continue elevation and keeping it clean with soap and water. Monitor for redness, purulent drainage,or worsening pain.  Albuterol inh 1-2 puff in the morning as needed.  If you need refills for medications you take chronically, please call your pharmacy. Do not use My Chart to request refills or for acute issues that need immediate attention. If you send a my chart message, it may take a few days to be addressed, specially if I am not in the office.  Please be sure medication list is accurate. If a new problem present, please set up appointment sooner than planned today.

## 2022-07-17 NOTE — Telephone Encounter (Signed)
Rx for Tramadol 50 mg sent to take 1/2 to 1 tab daily at bedtime. Thanks, BJ

## 2022-07-17 NOTE — Telephone Encounter (Signed)
Pt requesting pain meds, daughter say she was offered meds at appointment today and she said no but she has changed her mind.   [facility] [address], [place] - [address] Phone: [phone]  Fax: [phone]

## 2022-07-19 NOTE — Assessment & Plan Note (Signed)
Problem has been stable,Cr 1.08-1.2, e GFR 40's-low 50's. Low salt diet and adequate hydration to continue. Continue avoiding NSAID's.

## 2022-07-19 NOTE — Assessment & Plan Note (Addendum)
She is not on oral anticoagulation, which was recommended in 10/2020 by her cardiologist, Dr Tenny Craw but declined. Last EKG in 10/2020 showed SR with PAC's.

## 2022-07-19 NOTE — Assessment & Plan Note (Addendum)
Wound needs to be deeply clean and debrided, she was initially reluctant but finally agrees with evaluation at wound clinic. Keep wound clean with soap and water. No signs of bacterial infection. LE elevation a few times per day. For pain management Tramadol 50 mg 1/2-1 tab bid prn. Side effects discussed. Fall precautions to continue.

## 2022-07-19 NOTE — Assessment & Plan Note (Signed)
We reviewed chest CT report. She and her daughter agree with not pursuing further work up.

## 2022-07-19 NOTE — Assessment & Plan Note (Addendum)
Reporting some SOB when she first wakes up in the morning but does not seem to be a problem during the day. We discussed possible etiologies. Echo 01/2018 LVEF 60-65% and grade I diastolic dysfunction. She would like to try an inhaler. Albuterol inh 1-2 puff in the morning and qid during the day of needed. We discussed some side effects. Instructed about warning signs.

## 2022-07-19 NOTE — Assessment & Plan Note (Signed)
BP is adequate for her. Continue non pharmacologic treatment. Monitor BP at home.

## 2022-07-30 ENCOUNTER — Encounter (HOSPITAL_BASED_OUTPATIENT_CLINIC_OR_DEPARTMENT_OTHER): Payer: Medicare Other | Attending: General Surgery | Admitting: Internal Medicine

## 2022-07-30 DIAGNOSIS — I872 Venous insufficiency (chronic) (peripheral): Secondary | ICD-10-CM | POA: Diagnosis not present

## 2022-07-30 DIAGNOSIS — M1990 Unspecified osteoarthritis, unspecified site: Secondary | ICD-10-CM | POA: Diagnosis not present

## 2022-07-30 DIAGNOSIS — L97822 Non-pressure chronic ulcer of other part of left lower leg with fat layer exposed: Secondary | ICD-10-CM | POA: Insufficient documentation

## 2022-07-30 DIAGNOSIS — Z92241 Personal history of systemic steroid therapy: Secondary | ICD-10-CM

## 2022-07-30 DIAGNOSIS — I87312 Chronic venous hypertension (idiopathic) with ulcer of left lower extremity: Secondary | ICD-10-CM | POA: Diagnosis not present

## 2022-07-30 DIAGNOSIS — T798XXA Other early complications of trauma, initial encounter: Secondary | ICD-10-CM

## 2022-07-30 DIAGNOSIS — I10 Essential (primary) hypertension: Secondary | ICD-10-CM | POA: Diagnosis not present

## 2022-07-31 ENCOUNTER — Telehealth: Payer: Self-pay | Admitting: Family Medicine

## 2022-07-31 DIAGNOSIS — L97822 Non-pressure chronic ulcer of other part of left lower leg with fat layer exposed: Secondary | ICD-10-CM | POA: Diagnosis not present

## 2022-07-31 NOTE — Telephone Encounter (Signed)
Noted, will put in provider's bin for review.

## 2022-07-31 NOTE — Telephone Encounter (Signed)
[name] with Wound Care, came in person to drop off records for pt about her visit from 07/30/22. Records were put in provider's folder in front office.

## 2022-08-01 ENCOUNTER — Ambulatory Visit (INDEPENDENT_AMBULATORY_CARE_PROVIDER_SITE_OTHER): Payer: Medicare Other

## 2022-08-01 VITALS — Ht 60.0 in | Wt 100.0 lb

## 2022-08-01 DIAGNOSIS — Z0000 Encounter for general adult medical examination without abnormal findings: Secondary | ICD-10-CM | POA: Diagnosis not present

## 2022-08-01 NOTE — Progress Notes (Signed)
Subjective:   [name] is a 89 y.o. female who presents for Medicare Annual (Subsequent) preventive examination.  Visit Complete: Virtual  I connected with  [name] on 08/01/22 by a audio enabled telemedicine application and verified that I am speaking with the correct person using two identifiers.  Patient Location: Home  Provider Location: Home Office  I discussed the limitations of evaluation and management by telemedicine. The patient expressed understanding and agreed to proceed.  Patient Medicare AWV questionnaire was completed by the patient on ; I have confirmed that all information answered by patient is correct and no changes since this date.  Review of Systems     Cardiac Risk Factors include: advanced age (>41men, >21 women);hypertension     Objective:    Today's Vitals   08/01/22 0918  Weight: 100 lb (45.4 kg)  Height: 5' (1.524 m)   Body mass index is 19.53 kg/m.     08/01/2022    9:27 AM 07/28/2021   12:45 PM 08/31/2020    5:00 AM 08/30/2020    8:02 PM 07/27/2020   11:28 AM 03/21/2018    8:33 AM 01/27/2018    5:53 PM  Advanced Directives  Does Patient Have a Medical Advance Directive? Yes Yes Yes Yes Yes Yes Yes  Type of Estate agent of [place];Living will Healthcare Power of [place];Living will Healthcare Power of [place];Living will Living will;Healthcare Power of [employer] Power of [place];Living will Healthcare Power of [place];Living will Healthcare Power of [place];Living will  Does patient want to make changes to medical advance directive?  No - Patient declined No - Patient declined   No - Patient declined   Copy of Healthcare Power of Attorney in Chart? No - copy requested No - copy requested No - copy requested  No - copy requested No - copy requested     Current Medications (verified) Outpatient Encounter Medications as of 08/01/2022  Medication Sig   albuterol (VENTOLIN HFA) 108 (90 Base)  MCG/ACT inhaler Inhale 2 puffs into the lungs every 6 (six) hours as needed for wheezing or shortness of breath.   CALCIUM PO Take 1,300 mg by mouth daily.   Cyanocobalamin (VITAMIN B-12) 1000 MCG SUBL Place 1,000 mcg under the tongue daily.    diphenhydrAMINE (BENADRYL) 25 MG tablet Take 25 mg by mouth at bedtime as needed for sleep.   furosemide (LASIX) 20 MG tablet TAKE 1 TABLET BY MOUTH DAILY AS  NEEDED   Oyster Shell (OYSTER CALCIUM) 500 MG TABS tablet Take 1,000 mg of elemental calcium by mouth daily.   predniSONE (DELTASONE) 2.5 MG tablet Take 1-2 tablets (2.5-5 mg total) by mouth daily with breakfast.   vitamin C (ASCORBIC ACID) 250 MG tablet Take 500 mg by mouth daily.   No facility-administered encounter medications on file as of 08/01/2022.    Allergies (verified) Naproxen, Bactrim [sulfamethoxazole-trimethoprim], Doxycycline, Aspirin, and Codeine   History: Past Medical History:  Diagnosis Date   Alcohol abuse    Emphysema of lung (HCC) 01/26/2009   pt. unsure of this reports as of 07/31/2012   Fracture of fifth metatarsal bone of right foot 08/29/2012   Gout 08/11/2012   Hyperlipemia 03/27/2007   Hypertension    Osteoporosis 07/12/2006   Pernicious anemia 10/25/2006   Polymyalgia rheumatica (HCC) 03/27/2007   sees Dr. Kellie Simmering in rheumatology   Vitamin D deficiency 07/12/2006   Past Surgical History:  Procedure Laterality Date   APPENDECTOMY  07/12/2006   LUMBAR LAMINECTOMY/DECOMPRESSION MICRODISCECTOMY  Right 02/13/2017   Procedure: Microdiscectomy - right - Lumbar three-Lumbar four;  Surgeon: Tia Alert, MD;  Location: [facility] OR;  Service: Neurosurgery;  Laterality: Right;   PARTIAL HIP ARTHROPLASTY     left side- 2x's ( 2nd surgery to correct 1st replacement), R side -    TOOTH EXTRACTION N/A August 25, 2012   Procedure: EXTRACTIONS 17, 20;  Surgeon: Georgia Lopes, DDS;  Location: [facility] OR;  Service: Oral Surgery;  Laterality: N/A;   Family History  Problem Relation Age of Onset    Mental illness Mother        alzheimer dementia   Ulcers Father    Heart disease Father    Cancer Father        lung, smoker   Social History   Socioeconomic History   Marital status: Married    Spouse name: Not on file   Number of children: 2   Years of education: Not on file   Highest education level: Not on file  Occupational History   Not on file  Tobacco Use   Smoking status: Former    Packs/day: 2.00    Years: 58.00    Additional pack years: 0.00    Total pack years: 116.00    Types: Cigarettes    Start date: 01/23/1946    Quit date: 07/12/2006    Years since quitting: 16.0   Smokeless tobacco: Never  Vaping Use   Vaping Use: Never used  Substance and Sexual Activity   Alcohol use: No    Alcohol/week: 10.0 standard drinks of alcohol    Types: 10 Shots of liquor per week    Comment: quit completely - August 26, 2010   Drug use: No   Sexual activity: Never  Other Topics Concern   Not on file  Social History Narrative   Work or School: none      Home Situation: lives with husband - drives, husband takes care of finances, dresses herself, takes care of housework and cooking      Spiritual Beliefs: [employer] church      Lifestyle: uses cane when goes out, exercises sometimes - diet healthy      03/20/2018: Lives alone in one-level townhouse with dog. Husband passed away in fall of 08/25/2017. Coping well overall.   Has two daughters, both of whom live close by and are supportive.   Active in church   Enjoys gardening         Social Determinants of Health   Financial Resource Strain: Low Risk  (08/01/2022)   Overall Financial Resource Strain (CARDIA)    Difficulty of Paying Living Expenses: Not hard at all  Food Insecurity: No Food Insecurity (08/01/2022)   Hunger Vital Sign    Worried About Running Out of Food in the Last Year: Never true    Ran Out of Food in the Last Year: Never true  Transportation Needs: No Transportation Needs (08/01/2022)   PRAPARE - Therapist, art (Medical): No    Lack of Transportation (Non-Medical): No  Physical Activity: Insufficiently Active (08/01/2022)   Exercise Vital Sign    Days of Exercise per Week: 5 days    Minutes of Exercise per Session: 20 min  Stress: No Stress Concern Present (08/01/2022)   [employer] of Occupational Health - Occupational Stress Questionnaire    Feeling of Stress : Not at all  Social Connections: Moderately Integrated (08/01/2022)   Social Connection and Isolation Panel [NHANES]    Frequency of Communication  with Friends and Family: More than three times a week    Frequency of Social Gatherings with Friends and Family: More than three times a week    Attends Religious Services: More than 4 times per year    Active Member of [employer] or Organizations: Yes    Attends Banker Meetings: More than 4 times per year    Marital Status: Widowed    Tobacco Counseling Counseling given: Not Answered   Clinical Intake:  Pre-visit preparation completed: No  Pain : No/denies pain     BMI - recorded: 19.53 Nutritional Status: BMI of 19-24  Normal Nutritional Risks: None Diabetes: No  How often do you need to have someone help you when you read instructions, pamphlets, or other written materials from your doctor or pharmacy?: 1 - Never  Interpreter Needed?: No  Information entered by :: Theresa Mulligan LPN   Activities of Daily Living    08/01/2022    9:26 AM  In your present state of health, do you have any difficulty performing the following activities:  Hearing? 1  Comment Wears hearing aids  Vision? 0  Difficulty concentrating or making decisions? 0  Walking or climbing stairs? 0  Dressing or bathing? 0  Doing errands, shopping? 0  Preparing Food and eating ? N  Using the Toilet? N  In the past six months, have you accidently leaked urine? N  Do you have problems with loss of bowel control? N  Managing your Medications? N  Managing your  Finances? N  Housekeeping or managing your Housekeeping? N    Patient Care Team: [place], Betty G, MD as PCP - General (Family Medicine) Antony Contras, MD as Consulting Physician (Ophthalmology)  Indicate any recent Medical Services you may have received from other than [facility] providers in the past year (date may be approximate).     Assessment:   This is a routine wellness examination for [place].  Hearing/Vision screen Hearing Screening - Comments:: Wears hearing aids Vision Screening - Comments:: Wears rx glasses - up to date with routine eye exams with  Dr Randon Goldsmith  Dietary issues and exercise activities discussed:     Goals Addressed               This Visit's Progress     Stay healthy (pt-stated)         Depression Screen    08/01/2022    9:25 AM 10/24/2021    2:29 PM 08/11/2021    3:36 PM 07/28/2021   12:41 PM 05/30/2021    1:59 PM 08/26/2020    5:11 PM 07/27/2020   11:29 AM  PHQ 2/9 Scores  PHQ - 2 Score 0 0 0 0 0 0 0  PHQ- 9 Score      0     Fall Risk    08/01/2022    9:26 AM 07/17/2022   12:33 PM 10/24/2021    2:29 PM 08/11/2021    3:36 PM 07/28/2021   12:44 PM  Fall Risk   Falls in the past year? 0 0 0 0 0  Number falls in past yr: 0 0 0 0 0  Injury with Fall? 0 0 0 0 0  Risk for fall due to : No Fall Risks Other (Comment) Other (Comment) Other (Comment)   Follow up Falls prevention discussed Falls evaluation completed Falls evaluation completed Falls evaluation completed     MEDICARE RISK AT HOME:  Medicare Risk at Home - 08/01/22 0932  Any stairs in or around the home? Yes    If so, are there any without handrails? No    Home free of loose throw rugs in walkways, pet beds, electrical cords, etc? Yes    Adequate lighting in your home to reduce risk of falls? Yes    Life alert? Yes    Use of a cane, walker or w/c? Yes    Grab bars in the bathroom? Yes    Shower chair or bench in shower? Yes    Elevated toilet seat or a handicapped toilet? Yes              TIMED UP AND GO:  Was the test performed?  No    Cognitive Function:    08/30/2016   10:41 AM  MMSE - Mini Mental State Exam  Orientation to time 5  Orientation to Place 5  Registration 3  Attention/ Calculation 5  Recall 2  Language- name 2 objects 2  Language- repeat 1  Language- follow 3 step command 3  Language- read & follow direction 1  Write a sentence 1  Copy design 0  Total score 28        08/01/2022    9:28 AM 07/28/2021   12:45 PM  6CIT Screen  What Year? 0 points 0 points  What month? 0 points 0 points  What time? 0 points 0 points  Count back from 20 0 points 2 points  Months in reverse 0 points 0 points  Repeat phrase 0 points 0 points  Total Score 0 points 2 points    Immunizations Immunization History  Administered Date(s) Administered   Fluad Quad(high Dose 65+) 09/26/2018, 10/26/2019, 10/28/2020   Influenza Split 10/24/2010, 10/04/2011   Influenza Whole 12/18/2006, 11/12/2007, 10/19/2008, 10/25/2009   Influenza, High Dose Seasonal PF 10/12/2014, 10/10/2015, 10/25/2016, 11/25/2017   Influenza,inj,Quad PF,6+ Mos 10/02/2012, 10/21/2013   PFIZER(Purple Top)SARS-COV-2 Vaccination 02/08/2019, 03/02/2019, 11/02/2019, 06/06/2020   PNEUMOCOCCAL CONJUGATE-20 10/28/2020   Pneumococcal Conjugate-13 07/20/2014   Pneumococcal Polysaccharide-23 11/23/1994, 09/08/2012   Td 06/22/1997, 10/15/2007   Tdap 07/27/2020   Unspecified SARS-COV-2 Vaccination 12/14/2020    TDAP status: Up to date  Flu Vaccine status: Up to date  Pneumococcal vaccine status: Up to date  Covid-19 vaccine status: Completed vaccines  Qualifies for Shingles Vaccine? Yes   Zostavax completed No   Shingrix Completed?: No.    Education has been provided regarding the importance of this vaccine. Patient has been advised to call insurance company to determine out of pocket expense if they have not yet received this vaccine. Advised may also receive vaccine at local pharmacy or  Health Dept. Verbalized acceptance and understanding.  Screening Tests Health Maintenance  Topic Date Due   COVID-19 Vaccine (6 - 2023-24 season) 08/17/2022 (Originally 09/22/2021)   Zoster Vaccines- Shingrix (1 of 2) 11/01/2022 (Originally [birth year]1980)   INFLUENZA VACCINE  08/23/2022   Medicare Annual Wellness (AWV)  08/01/2023   DTaP/Tdap/Td (4 - Td or Tdap) 07/28/2030   Pneumonia Vaccine 36+ Years old  Completed   DEXA SCAN  Completed   HPV VACCINES  Aged Out    Health Maintenance  There are no preventive care reminders to display for this patient.   Colorectal cancer screening: No longer required.   Mammogram status: No longer required due to Age.  Bone Density status: Completed 08/03/14. Results reflect: Bone density results: OSTEOPOROSIS. Repeat every   years.  Lung Cancer Screening: (Low Dose CT Chest recommended if Age 63-80 years, 20 pack-year  currently smoking OR have quit w/in 15years.) does not qualify.     Additional Screening:  Hepatitis C Screening: does not qualify; Completed   Vision Screening: Recommended annual ophthalmology exams for early detection of glaucoma and other disorders of the eye. Is the patient up to date with their annual eye exam?  Yes  Who is the provider or what is the name of the office in which the patient attends annual eye exams? Dr Randon Goldsmith If pt is not established with a provider, would they like to be referred to a provider to establish care? No .   Dental Screening: Recommended annual dental exams for proper oral hygiene    Community Resource Referral / Chronic Care Management:  CRR required this visit?  No   CCM required this visit?  No     Plan:     I have personally reviewed and noted the following in the patient's chart:   Medical and social history Use of alcohol, tobacco or illicit drugs  Current medications and supplements including opioid prescriptions. Patient is not currently taking opioid  prescriptions. Functional ability and status Nutritional status Physical activity Advanced directives List of other physicians Hospitalizations, surgeries, and ER visits in previous 12 months Vitals Screenings to include cognitive, depression, and falls Referrals and appointments  In addition, I have reviewed and discussed with patient certain preventive protocols, quality metrics, and best practice recommendations. A written personalized care plan for preventive services as well as general preventive health recommendations were provided to patient.     Tillie Rung, LPN   09/01/9145   After Visit Summary: (MyChart) Due to this being a telephonic visit, the after visit summary with patients personalized plan was offered to patient via MyChart   Nurse Notes: None

## 2022-08-01 NOTE — Patient Instructions (Addendum)
[name] , Thank you for taking time to come for your Medicare Wellness Visit. I appreciate your ongoing commitment to your health goals. Please review the following plan we discussed and let me know if I can assist you in the future.   These are the goals we discussed:  Goals       Patient Stated      Maintain independence, keep away from sick people!       Stay healthy (pt-stated)      Stay healthy (pt-stated)        This is a list of the screening recommended for you and due dates:  Health Maintenance  Topic Date Due   COVID-19 Vaccine (6 - 2023-24 season) 08/17/2022*   Zoster (Shingles) Vaccine (1 of 2) 11/01/2022*   Flu Shot  08/23/2022   Medicare Annual Wellness Visit  08/01/2023   DTaP/Tdap/Td vaccine (4 - Td or Tdap) 07/28/2030   Pneumonia Vaccine  Completed   DEXA scan (bone density measurement)  Completed   HPV Vaccine  Aged Out  *Topic was postponed. The date shown is not the original due date.    Advanced directives: Please bring a copy of your health care power of attorney and living will to the office to be added to your chart at your convenience.   Conditions/risks identified: None  Next appointment: Follow up in one year for your annual wellness visit    Preventive Care 65 Years and Older, Female Preventive care refers to lifestyle choices and visits with your health care provider that can promote health and wellness. What does preventive care include? A yearly physical exam. This is also called an annual well check. Dental exams once or twice a year. Routine eye exams. Ask your health care provider how often you should have your eyes checked. Personal lifestyle choices, including: Daily care of your teeth and gums. Regular physical activity. Eating a healthy diet. Avoiding tobacco and drug use. Limiting alcohol use. Practicing safe sex. Taking low-dose aspirin every day. Taking vitamin and mineral supplements as recommended by your health care  provider. What happens during an annual well check? The services and screenings done by your health care provider during your annual well check will depend on your age, overall health, lifestyle risk factors, and family history of disease. Counseling  Your health care provider may ask you questions about your: Alcohol use. Tobacco use. Drug use. Emotional well-being. Home and relationship well-being. Sexual activity. Eating habits. History of falls. Memory and ability to understand (cognition). Work and work Astronomer. Reproductive health. Screening  You may have the following tests or measurements: Height, weight, and BMI. Blood pressure. Lipid and cholesterol levels. These may be checked every 5 years, or more frequently if you are over 77 years old. Skin check. Lung cancer screening. You may have this screening every year starting at age 28 if you have a 30-pack-year history of smoking and currently smoke or have quit within the past 15 years. Fecal occult blood test (FOBT) of the stool. You may have this test every year starting at age 56. Flexible sigmoidoscopy or colonoscopy. You may have a sigmoidoscopy every 5 years or a colonoscopy every 10 years starting at age 82. Hepatitis C blood test. Hepatitis B blood test. Sexually transmitted disease (STD) testing. Diabetes screening. This is done by checking your blood sugar (glucose) after you have not eaten for a while (fasting). You may have this done every 1-3 years. Bone density scan. This is done to  screen for osteoporosis. You may have this done starting at age 33. Mammogram. This may be done every 1-2 years. Talk to your health care provider about how often you should have regular mammograms. Talk with your health care provider about your test results, treatment options, and if necessary, the need for more tests. Vaccines  Your health care provider may recommend certain vaccines, such as: Influenza vaccine. This is  recommended every year. Tetanus, diphtheria, and acellular pertussis (Tdap, Td) vaccine. You may need a Td booster every 10 years. Zoster vaccine. You may need this after age 18. Pneumococcal 13-valent conjugate (PCV13) vaccine. One dose is recommended after age 8. Pneumococcal polysaccharide (PPSV23) vaccine. One dose is recommended after age 33. Talk to your health care provider about which screenings and vaccines you need and how often you need them. This information is not intended to replace advice given to you by your health care provider. Make sure you discuss any questions you have with your health care provider. Document Released: 02/04/2015 Document Revised: 09/28/2015 Document Reviewed: 11/09/2014 Elsevier Interactive Patient Education  2017 [employer].  Fall Prevention in the Home Falls can cause injuries. They can happen to people of all ages. There are many things you can do to make your home safe and to help prevent falls. What can I do on the outside of my home? Regularly fix the edges of walkways and driveways and fix any cracks. Remove anything that might make you trip as you walk through a door, such as a raised step or threshold. Trim any bushes or trees on the path to your home. Use bright outdoor lighting. Clear any walking paths of anything that might make someone trip, such as rocks or tools. Regularly check to see if handrails are loose or broken. Make sure that both sides of any steps have handrails. Any raised decks and porches should have guardrails on the edges. Have any leaves, snow, or ice cleared regularly. Use sand or salt on walking paths during winter. Clean up any spills in your garage right away. This includes oil or grease spills. What can I do in the bathroom? Use night lights. Install grab bars by the toilet and in the tub and shower. Do not use towel bars as grab bars. Use non-skid mats or decals in the tub or shower. If you need to sit down in  the shower, use a plastic, non-slip stool. Keep the floor dry. Clean up any water that spills on the floor as soon as it happens. Remove soap buildup in the tub or shower regularly. Attach bath mats securely with double-sided non-slip rug tape. Do not have throw rugs and other things on the floor that can make you trip. What can I do in the bedroom? Use night lights. Make sure that you have a light by your bed that is easy to reach. Do not use any sheets or blankets that are too big for your bed. They should not hang down onto the floor. Have a firm chair that has side arms. You can use this for support while you get dressed. Do not have throw rugs and other things on the floor that can make you trip. What can I do in the kitchen? Clean up any spills right away. Avoid walking on wet floors. Keep items that you use a lot in easy-to-reach places. If you need to reach something above you, use a strong step stool that has a grab bar. Keep electrical cords out of the way.  Do not use floor polish or wax that makes floors slippery. If you must use wax, use non-skid floor wax. Do not have throw rugs and other things on the floor that can make you trip. What can I do with my stairs? Do not leave any items on the stairs. Make sure that there are handrails on both sides of the stairs and use them. Fix handrails that are broken or loose. Make sure that handrails are as long as the stairways. Check any carpeting to make sure that it is firmly attached to the stairs. Fix any carpet that is loose or worn. Avoid having throw rugs at the top or bottom of the stairs. If you do have throw rugs, attach them to the floor with carpet tape. Make sure that you have a light switch at the top of the stairs and the bottom of the stairs. If you do not have them, ask someone to add them for you. What else can I do to help prevent falls? Wear shoes that: Do not have high heels. Have rubber bottoms. Are comfortable  and fit you well. Are closed at the toe. Do not wear sandals. If you use a stepladder: Make sure that it is fully opened. Do not climb a closed stepladder. Make sure that both sides of the stepladder are locked into place. Ask someone to hold it for you, if possible. Clearly mark and make sure that you can see: Any grab bars or handrails. First and last steps. Where the edge of each step is. Use tools that help you move around (mobility aids) if they are needed. These include: Canes. Walkers. Scooters. Crutches. Turn on the lights when you go into a dark area. Replace any light bulbs as soon as they burn out. Set up your furniture so you have a clear path. Avoid moving your furniture around. If any of your floors are uneven, fix them. If there are any pets around you, be aware of where they are. Review your medicines with your doctor. Some medicines can make you feel dizzy. This can increase your chance of falling. Ask your doctor what other things that you can do to help prevent falls. This information is not intended to replace advice given to you by your health care provider. Make sure you discuss any questions you have with your health care provider. Document Released: 11/04/2008 Document Revised: 06/16/2015 Document Reviewed: 02/12/2014 Elsevier Interactive Patient Education  2017 [employer].

## 2022-08-01 NOTE — Progress Notes (Signed)
Heinemann, Jannelle [name] ([id number]) [id number] Page 1 of 4 Visit Report for 07/30/2022 Abuse Risk Screen Details Patient Name: Date of Service: [name], [name]. 07/30/2022 8:00 A M Medical Record Number: [id number] Patient Account Number: [id number] Date of Birth/Sex: Treating RN: Jul 17, 1928 (89 y.o. Gevena Mart Primary Care Katherleen Folkes: [place], Betty Other Clinician: Referring Coben Godshall: Treating Lorijean Husser/Extender: Hoffman, [name] [place], Betty Weeks in Treatment: 0 Abuse Risk Screen Items Answer ABUSE RISK SCREEN: Has anyone close to you tried to hurt or harm you recentlyo No Do you feel uncomfortable with anyone in your familyo No Has anyone forced you do things that you didnt want to doo No Electronic Signature(s) Signed: 08/01/2022 4:28:57 PM By: Brenton Grills Entered By: Brenton Grills on 07/30/2022 08:16:27 -------------------------------------------------------------------------------- Activities of Daily Living Details Patient Name: Date of Service: [name], [name]. 07/30/2022 8:00 A M Medical Record Number: [id number] Patient Account Number: [id number] Date of Birth/Sex: Treating RN: 1928-11-24 (89 y.o. Gevena Mart Primary Care Janet Humphreys: [place], Betty Other Clinician: Referring Elija Mccamish: Treating Rheagan Nayak/Extender: Hoffman, [name] [place], Betty Weeks in Treatment: 0 Activities of Daily Living Items Answer Activities of Daily Living (Please select one for each item) Drive Automobile Need Assistance [name] Medications ake Need Assistance Use [name] elephone Need Assistance Care for Appearance Need Assistance Use [name] oilet Need Assistance Bath / Shower Need Assistance Dress Self Need Assistance Feed Self Need Assistance Walk Completely Able Get In / Out Bed Need Assistance Housework Need Assistance Prepare Meals Need Assistance Handle Money Need Assistance Shop for Self Need Assistance Electronic Signature(s) Signed:  08/01/2022 4:28:57 PM By: Brenton Grills Entered By: Brenton Grills on 07/30/2022 08:17:35 Mathers, Terika [name] ([id number]) [id number] Page 2 of 4 -------------------------------------------------------------------------------- Education Screening Details Patient Name: Date of Service: [name], [name]. 07/30/2022 8:00 A M Medical Record Number: [id number] Patient Account Number: [id number] Date of Birth/Sex: Treating RN: January 28, 1928 (89 y.o. Gevena Mart Primary Care Ziyad Dyar: [place], Betty Other Clinician: Referring Howard Bunte: Treating Felis Quillin/Extender: Hoffman, [name] [place], Betty Weeks in Treatment: 0 Learning Preferences/Education Level/Primary Language Preferred Language: English Cognitive Barrier Language Barrier: No Translator Needed: No Memory Deficit: No Emotional Barrier: No Cultural/Religious Beliefs Affecting Medical Care: No Knowledge/Comprehension Knowledge Level: High Comprehension Level: High Ability to understand written instructions: High Ability to understand verbal instructions: High Motivation Anxiety Level: Calm Cooperation: Cooperative Education Importance: Acknowledges Need Interest in Health Problems: Asks Questions Perception: Coherent Willingness to Engage in Self-Management High Activities: Readiness to Engage in Self-Management High Activities: Electronic Signature(s) Signed: 08/01/2022 4:28:57 PM By: Brenton Grills Entered By: Brenton Grills on 07/30/2022 08:19:41 -------------------------------------------------------------------------------- Fall Risk Assessment Details Patient Name: Date of Service: [name] NDSO [name]. 07/30/2022 8:00 A M Medical Record Number: [id number] Patient Account Number: [id number] Date of Birth/Sex: Treating RN: 11-Nov-1928 (89 y.o. Gevena Mart Primary Care Lauraann Missey: [place], Betty Other Clinician: Referring Aprile Dickenson: Treating Derek Huneycutt/Extender: Hoffman,  [name] [place], Betty Weeks in Treatment: 0 Fall Risk Assessment Items Have you had 2 or more falls in the last 12 monthso 0 No Have you had any fall that resulted in injury in the last 12 monthso 0 No FALLS RISK SCREEN History of falling - immediate or within 3 months 0 No Secondary diagnosis (Do you have 2 or more medical diagnoseso) 0 No Ambulatory aid None/bed rest/wheelchair/nurse 0 No Crutches/cane/walker 0 No Furniture 0 No Intravenous therapy Access/Saline/Heparin Lock 0 No Gait/Transferring Kuyper, Nesiah [name] ([id number]) [id number] Page 3 of 4 Normal/ bed rest/ wheelchair 0 No Weak (short steps  with or without shuffle, stooped but able to lift head while walking, may seek 0 No support from furniture) Impaired (short steps with shuffle, may have difficulty arising from chair, head down, impaired 0 No balance) Mental Status Oriented to own ability 0 No Electronic Signature(s) Signed: 08/01/2022 4:28:57 PM By: Brenton Grills Entered By: Brenton Grills on 07/30/2022 08:19:58 -------------------------------------------------------------------------------- Foot Assessment Details Patient Name: Date of Service: [name] NDSO [name]. 07/30/2022 8:00 A M Medical Record Number: [id number] Patient Account Number: [id number] Date of Birth/Sex: Treating RN: 1928/10/25 (89 y.o. Gevena Mart Primary Care Khamari Yousuf: [place], Betty Other Clinician: Referring Izella Ybanez: Treating Johanan Skorupski/Extender: Hoffman, [name] [place], Betty Weeks in Treatment: 0 Foot Assessment Items Site Locations + = Sensation present, - = Sensation absent, C = Callus, U = Ulcer R = Redness, W = Warmth, M = Maceration, PU = Pre-ulcerative lesion F = Fissure, S = Swelling, D = Dryness Assessment Right: Left: Other Deformity: No No Prior Foot Ulcer: No No Prior Amputation: No No Charcot Joint: No No Ambulatory Status: Ambulatory Without Help Gait: Steady Electronic  Signature(s) Signed: 08/01/2022 4:28:57 PM By: Brenton Grills Entered By: Brenton Grills on 07/30/2022 08:25:02 Laroque, Anamari [name] ([id number]) [id number] Page 4 of 4 -------------------------------------------------------------------------------- Nutrition Risk Screening Details Patient Name: Date of Service: [name], [name]. 07/30/2022 8:00 A M Medical Record Number: [id number] Patient Account Number: [id number] Date of Birth/Sex: Treating RN: 1928-06-05 (89 y.o. Gevena Mart Primary Care Cereniti Curb: [place], Betty Other Clinician: Referring Mike Hamre: Treating Cohl Behrens/Extender: Hoffman, [name] [place], Betty Weeks in Treatment: 0 Height (in): 60 Weight (lbs): 97 Body Mass Index (BMI): 18.9 Nutrition Risk Screening Items Score Screening NUTRITION RISK SCREEN: I have an illness or condition that made me change the kind and/or amount of food I eat 0 No I eat fewer than two meals per day 0 No I eat few fruits and vegetables, or milk products 0 No I have three or more drinks of beer, liquor or wine almost every day 0 No I have tooth or mouth problems that make it hard for me to eat 0 No I don'[name] always have enough money to buy the food I need 0 No I eat alone most of the time 0 No I take three or more different prescribed or over-the-counter drugs a day 0 No Without wanting to, I have lost or gained 10 pounds in the last six months 0 No I am not always physically able to shop, cook and/or feed myself 0 No Nutrition Protocols Good Risk Protocol Moderate Risk Protocol High Risk Proctocol Risk Level: Good Risk Score: 0 Electronic Signature(s) Signed: 08/01/2022 4:28:57 PM By: Brenton Grills Entered By: Brenton Grills on 07/30/2022 08:20:13

## 2022-08-01 NOTE — Progress Notes (Signed)
Novalyn, Lajara Graci T ([id number]) [id number] Page 1 of 9 Visit Report for 07/30/2022 Chief Complaint Document Details Patient Name: Date of Service: [name], [name] 07/30/2022 8:00 A M Medical Record Number: [id number] Patient Account Number: [id number] Date of Birth/Sex: Treating RN: 1928/10/03 (89 y.o. F) Primary Care Provider: [place], Betty Other Clinician: Referring Provider: Treating Provider/Extender: Zamiya Dillard [place], Betty Weeks in Treatment: 0 Information Obtained from: Patient Chief Complaint 07/30/2022; left lower extremity wound secondary to trauma Electronic Signature(s) Signed: 07/30/2022 12:25:26 PM By: Geralyn Corwin DO Entered By: Geralyn Corwin on 07/30/2022 09:47:49 -------------------------------------------------------------------------------- Debridement Details Patient Name: Date of Service: [name], [name] 07/30/2022 8:00 A M Medical Record Number: [id number] Patient Account Number: [id number] Date of Birth/Sex: Treating RN: 1928-11-11 (89 y.o. [name] Primary Care Provider: [place], Betty Other Clinician: Referring Provider: Treating Provider/Extender: Melyssa Signor [place], Betty Weeks in Treatment: 0 Debridement Performed for Assessment: Wound #4 Left,Anterior Lower Leg Performed By: Physician Geralyn Corwin, DO Debridement Type: Debridement Level of Consciousness (Pre-procedure): Awake and Alert Pre-procedure Verification/Time Out Yes - 09:07 Taken: Start Time: 09:08 Pain Control: Lidocaine 4% T opical Solution Percent of Wound Bed Debrided: 100% T Area Debrided (cm): otal 15.9 Tissue and other material debrided: Viable, Non-Viable, Eschar, Slough, Slough Level: Non-Viable Tissue Debridement Description: Selective/Open Wound Instrument: Curette Bleeding: Minimum Hemostasis Achieved: Pressure End Time: 09:10 Procedural Pain: 0 Post Procedural Pain: 0 Response to Treatment: Procedure was  tolerated well Level of Consciousness (Post- Awake and Alert procedure): Post Debridement Measurements of Total Wound Length: (cm) 7.5 Width: (cm) 2.7 Depth: (cm) 0.1 Volume: (cm) 1.59 Character of Wound/Ulcer Post Debridement: Stable Willison, Sincerity T ([id number]) [phone].pdf Page 2 of 9 Post Procedure Diagnosis Same as Pre-procedure Notes Scribed for Dr Mikey Bussing by Brenton Grills RN. Electronic Signature(s) Signed: 07/30/2022 12:25:26 PM By: Geralyn Corwin DO Signed: 08/01/2022 4:28:57 PM By: Brenton Grills Entered By: Brenton Grills on 07/30/2022 09:09:06 -------------------------------------------------------------------------------- HPI Details Patient Name: Date of Service: [name], [name] 07/30/2022 8:00 A M Medical Record Number: [id number] Patient Account Number: [id number] Date of Birth/Sex: Treating RN: 1928/03/23 (89 y.o. F) Primary Care Provider: [place], Betty Other Clinician: Referring Provider: Treating Provider/Extender: Julian Askin [place], Betty Weeks in Treatment: 0 History of Present Illness HPI Description: ADMISSION 10/06/2018 This is an 89 year old woman who is here for wounds on her bilateral anterior lower legs. She was previously seen in early 2017 with a wound on her left leg that was traumatic. Felt to have lymphedema and venous insufficiency at that time although she had a reflux study that did not show significant superficial reflux in the left lower extremity. This healed. At some point she has had stockings prescribed but she cannot get the standard stocking on. The patient is also on chronic prednisone I think related to polymyalgia rheumatica currently at 2.5 mg daily. The patient states that in July she slipped out of bed traumatizing the anterior part of her leg. She was seen by her primary care physicians beginning on 08/14/2018. It was noted to have some erythema and swelling she was given Keflex. On 7/30 she  had doxycycline and I think he has had perhaps another round of doxycycline after this. Noted in their notes to have a history of chronic venous insufficiency and some edema. Sometime in August she traumatized her left anterior leg with the tip of her cane and has an open wound in this area as well. She has not been doing anything specific to the wounds themselves leaving  them open to air and putting Band-Aids on top. Past medical history; hypertension, polymyalgia rheumatica on chronic prednisone, history of microdiscectomy, B12 deficiency, vitamin D deficiency, emphysema, osteoporosis ABIs in our clinic were 0.96 on the right and 0.99 on the left. 9/22; 2 small wounds on the left anterior and right anterior tibial area. These are better than last week. We put her in 3 layer compression unfortunately she appears to have a new wound on the medial right lower leg anteriorly probably because of wrap injury. She is on chronic prednisone with a history of chronic polymyalgia rheumatica and has very frail skin on her lower extremities 9/29; 2 small wounds on the left anterior and right anterior tibial areas these continue to improve. The abrasion she had from last week appears to have closed over. 10/6; the patient had 2 wounds on the left anterior and right anterior tibial wounds. The left anterior wound is just about closed. Right anterior tibial is still open. 10/13; the patient's leg is healed on the left as predicted. The area on the right is still open but measuring smaller 10/20; the patient comes in today with a small area on the left medial lower leg. The area on the right is still open but measuring smaller. We had put her in a dual layer stocking last week on the left she is not able to get the 2 layers on she will only wear 1 layer. 10/27; the left medial lower leg is closed. The right one is just about closed. We continue to dress the right leg and compression we put her 11/5; the left  medial leg is closed and she has a juxta lite stocking on although her daughter had to apply it. The right one is closed today and we will put a juxta lite stocking on this leg as well. The patient states most of the skin changes are due to chronic steroid use. T the point this is true but she has tightly fibrotic o skin in her distal lower extremities suggesting some degree of chronic venous insufficiency as well. She does not have a lot of edema in her leg 07/30/2022 Ms. Erandi Lemma is a 89 year old female with a past medical history of essential hypertension, polymyalgia rheumatica on chronic steroids, and venous insufficiency that presents to the clinic for a 9-week history of nonhealing wound to the left lower extremities. She states she hit her leg against a table and developed a hematoma that eventually opened and drained. She has been using hydrogen peroxide to the wound bed. She currently denies signs of infection. She has not been on antibiotics. She has compression stockings at home but has difficulty putting these on. She states she cannot even put on a sock. Electronic Signature(s) Signed: 07/30/2022 12:25:26 PM By: Geralyn Corwin DO Entered By: Geralyn Corwin on 07/30/2022 09:48:57 Chawla, Lashante T ([id number]) [id number] Page 3 of 9 -------------------------------------------------------------------------------- Physical Exam Details Patient Name: Date of Service: [name] NDSO LENNI, RECKNER T. 07/30/2022 8:00 A M Medical Record Number: [id number] Patient Account Number: [id number] Date of Birth/Sex: Treating RN: 16-Jun-1928 (89 y.o. F) Primary Care Provider: [place], Betty Other Clinician: Referring Provider: Treating Provider/Extender: Turrell Severt [place], Betty Weeks in Treatment: 0 Constitutional respirations regular, non-labored and within target range for patient.. Cardiovascular 2+ dorsalis pedis/posterior tibialis  pulses. Psychiatric pleasant and cooperative. Notes Left lower extremity: T the anterior aspect there is an open wound with nonviable tissue throughout. No signs of surrounding infection. Significant venous o stasis dermatitis noted throughout the  leg. Electronic Signature(s) Signed: 07/30/2022 12:25:26 PM By: Geralyn Corwin DO Entered By: Geralyn Corwin on 07/30/2022 09:49:43 -------------------------------------------------------------------------------- Physician Orders Details Patient Name: Date of Service: [name], [name] 07/30/2022 8:00 A M Medical Record Number: [id number] Patient Account Number: [id number] Date of Birth/Sex: Treating RN: Aug 13, 1928 (89 y.o. [name] Primary Care Provider: [place], Betty Other Clinician: Referring Provider: Treating Provider/Extender: Hailie Searight [place], Betty Weeks in Treatment: 0 Verbal / Phone Orders: No Diagnosis Coding ICD-10 Coding Code Description T79.8XXA Other early complications of trauma, initial encounter L97.822 Non-pressure chronic ulcer of other part of left lower leg with fat layer exposed I87.312 Chronic venous hypertension (idiopathic) with ulcer of left lower extremity M35.3 Polymyalgia rheumatica Follow-up Appointments ppointment in 1 week. - Change dressings daily. Use 3 pieces of 4x4 gauze soaked in Vashe over the wound, then cover with 3 dry pieces Return A of gauze. Wrap with Kerlix gauze and tape in place. Anesthetic (In clinic) Topical Lidocaine 5% applied to wound bed Bathing/ Shower/ Hygiene May shower and wash wound with soap and water. Edema Control - Lymphedema / SCD / Other Bilateral Lower Extremities Elevate legs to the level of the heart or above for 30 minutes daily and/or when sitting for 3-4 times a day throughout the day. Avoid standing for long periods of time. Sequoia, Witz Tatiyanna T ([id number]) [id number] Page 4 of 9 Wound Treatment Wound #4 -  Lower Leg Wound Laterality: Left, Anterior Cleanser: Vashe 5.8 (oz) 1 x Per Day/30 Days Discharge Instructions: Cleanse the wound with Vashe prior to applying a clean dressing using gauze sponges, not tissue or cotton balls. Secured With: Paper Tape, 2x10 (in/yd) (DME) (Generic) 1 x Per Day/30 Days Discharge Instructions: Secure dressing with tape as directed. Compression Wrap: Kerlix Roll 4.5x3.1 (in/yd) (DME) (Generic) 1 x Per Day/30 Days Discharge Instructions: Apply Kerlix and Coban compression as directed. Electronic Signature(s) Signed: 07/30/2022 12:25:26 PM By: Geralyn Corwin DO Entered By: Geralyn Corwin on 07/30/2022 09:52:24 -------------------------------------------------------------------------------- Problem List Details Patient Name: Date of Service: [name] NDSO [name] 07/30/2022 8:00 A M Medical Record Number: [id number] Patient Account Number: [id number] Date of Birth/Sex: Treating RN: [birth year] (89 y.o. F) Primary Care Provider: [place], Betty Other Clinician: Referring Provider: Treating Provider/Extender: Danyla Wattley [place], Betty Weeks in Treatment: 0 Active Problems ICD-10 Encounter Code Description Active Date MDM Diagnosis T79.8XXA Other early complications of trauma, initial encounter 07/30/2022 No Yes L97.822 Non-pressure chronic ulcer of other part of left lower leg with fat layer exposed7/08/2022 No Yes I87.312 Chronic venous hypertension (idiopathic) with ulcer of left lower extremity 07/30/2022 No Yes M35.3 Polymyalgia rheumatica 07/30/2022 No Yes Z92.241 Personal history of systemic steroid therapy 07/30/2022 No Yes Inactive Problems Resolved Problems Electronic Signature(s) Signed: 07/30/2022 12:25:26 PM By: Geralyn Corwin DO Entered By: Geralyn Corwin on 07/30/2022 09:47:16 Larocca, Mykah T ([id number]) [id number] Page 5 of  9 -------------------------------------------------------------------------------- Progress Note Details Patient Name: Date of Service: [name], [name] 07/30/2022 8:00 A M Medical Record Number: [id number] Patient Account Number: [id number] Date of Birth/Sex: Treating RN: [birth year] (89 y.o. F) Primary Care Provider: [place], Betty Other Clinician: Referring Provider: Treating Provider/Extender: Simran Mannis [place], Betty Weeks in Treatment: 0 Subjective Chief Complaint Information obtained from Patient 07/30/2022; left lower extremity wound secondary to trauma History of Present Illness (HPI) ADMISSION 10/06/2018 This is an 89 year old woman who is here for wounds on her bilateral anterior lower legs. She was previously seen in early 2017 with a wound on her left leg that was traumatic.  Felt to have lymphedema and venous insufficiency at that time although she had a reflux study that did not show significant superficial reflux in the left lower extremity. This healed. At some point she has had stockings prescribed but she cannot get the standard stocking on. The patient is also on chronic prednisone I think related to polymyalgia rheumatica currently at 2.5 mg daily. The patient states that in July she slipped out of bed traumatizing the anterior part of her leg. She was seen by her primary care physicians beginning on 08/14/2018. It was noted to have some erythema and swelling she was given Keflex. On 7/30 she had doxycycline and I think he has had perhaps another round of doxycycline after this. Noted in their notes to have a history of chronic venous insufficiency and some edema. Sometime in August she traumatized her left anterior leg with the tip of her cane and has an open wound in this area as well. She has not been doing anything specific to the wounds themselves leaving them open to air and putting Band-Aids on top. Past medical history; hypertension, polymyalgia rheumatica  on chronic prednisone, history of microdiscectomy, B12 deficiency, vitamin D deficiency, emphysema, osteoporosis ABIs in our clinic were 0.96 on the right and 0.99 on the left. 9/22; 2 small wounds on the left anterior and right anterior tibial area. These are better than last week. We put her in 3 layer compression unfortunately she appears to have a new wound on the medial right lower leg anteriorly probably because of wrap injury. She is on chronic prednisone with a history of chronic polymyalgia rheumatica and has very frail skin on her lower extremities 9/29; 2 small wounds on the left anterior and right anterior tibial areas these continue to improve. The abrasion she had from last week appears to have closed over. 10/6; the patient had 2 wounds on the left anterior and right anterior tibial wounds. The left anterior wound is just about closed. Right anterior tibial is still open. 10/13; the patient's leg is healed on the left as predicted. The area on the right is still open but measuring smaller 10/20; the patient comes in today with a small area on the left medial lower leg. The area on the right is still open but measuring smaller. We had put her in a dual layer stocking last week on the left she is not able to get the 2 layers on she will only wear 1 layer. 10/27; the left medial lower leg is closed. The right one is just about closed. We continue to dress the right leg and compression we put her 11/5; the left medial leg is closed and she has a juxta lite stocking on although her daughter had to apply it. The right one is closed today and we will put a juxta lite stocking on this leg as well. The patient states most of the skin changes are due to chronic steroid use. T the point this is true but she has tightly fibrotic o skin in her distal lower extremities suggesting some degree of chronic venous insufficiency as well. She does not have a lot of edema in her leg 07/30/2022 [name] is a 89 year old female with a past medical history of essential hypertension, polymyalgia rheumatica on chronic steroids, and venous insufficiency that presents to the clinic for a 9-week history of nonhealing wound to the left lower extremities. She states she hit her leg against a table and developed a hematoma that eventually  opened and drained. She has been using hydrogen peroxide to the wound bed. She currently denies signs of infection. She has not been on antibiotics. She has compression stockings at home but has difficulty putting these on. She states she cannot even put on a sock. Patient History Information obtained from Patient. Allergies aspirin, codeine, Bactrim Family History Cancer - Father, Heart Disease - Father, Lung Disease - Father, No family history of Diabetes, Hereditary Spherocytosis, Hypertension, Seizures, Stroke, Thyroid Problems, Tuberculosis. Social History Former smoker, Marital Status - Widowed, Alcohol Use - Moderate, Drug Use - No History, Caffeine Use - Moderate. Medical History Eyes Denies history of Cataracts, Glaucoma, Optic Neuritis Ear/Nose/Mouth/Throat Denies history of Chronic sinus problems/congestion, Middle ear problems Hematologic/Lymphatic Patient has history of Anemia Denies history of Hemophilia, Human Immunodeficiency Virus, Lymphedema, Sickle Cell Disease Respiratory Denies history of Aspiration, Asthma, Chronic Obstructive Pulmonary Disease (COPD), Pneumothorax, Sleep Apnea, Tuberculosis Detwiler, Syvanna T ([id number]) [id number] Page 6 of 9 Cardiovascular Patient has history of Hypertension Denies history of Angina, Arrhythmia, Congestive Heart Failure, Coronary Artery Disease, Deep Vein Thrombosis, Hypotension, Myocardial Infarction, Peripheral Arterial Disease, Peripheral Venous Disease, Phlebitis, Vasculitis Gastrointestinal Denies history of Cirrhosis , Colitis, Crohns, Hepatitis A,  Hepatitis B, Hepatitis C Endocrine Denies history of Type I Diabetes, Type II Diabetes Genitourinary Denies history of End Stage Renal Disease Immunological Denies history of Lupus Erythematosus, Raynauds, Scleroderma Integumentary (Skin) Denies history of History of Burn Musculoskeletal Patient has history of Osteoarthritis Denies history of Gout, Rheumatoid Arthritis, Osteomyelitis Neurologic Denies history of Dementia, Neuropathy, Quadriplegia, Paraplegia, Seizure Disorder Oncologic Denies history of Received Chemotherapy, Received Radiation Psychiatric Denies history of Anorexia/bulimia, Confinement Anxiety Objective Constitutional respirations regular, non-labored and within target range for patient.. Vitals Time Taken: 8:13 AM, Height: 60 in, Weight: 97 lbs, BMI: 18.9, Temperature: 98.4 F, Pulse: 75 bpm, Respiratory Rate: 18 breaths/min, Blood Pressure: 182/84 mmHg. Cardiovascular 2+ dorsalis pedis/posterior tibialis pulses. Psychiatric pleasant and cooperative. General Notes: Left lower extremity: T the anterior aspect there is an open wound with nonviable tissue throughout. No signs of surrounding infection. o Significant venous stasis dermatitis noted throughout the leg. Integumentary (Hair, Skin) Wound #4 status is Open. Original cause of wound was Skin T ear/Laceration. The date acquired was: 05/22/2022. The wound is located on the Left,Anterior Lower Leg. The wound measures 7.5cm length x 2.7cm width x 0.2cm depth; 15.904cm^2 area and 3.181cm^3 volume. There is Fat Layer (Subcutaneous Tissue) exposed. There is no tunneling or undermining noted. There is a medium amount of serosanguineous drainage noted. There is small (1-33%) red granulation within the wound bed. There is a large (67-100%) amount of necrotic tissue within the wound bed including Eschar and Adherent Slough. Assessment Active Problems ICD-10 Other early complications of trauma, initial  encounter Non-pressure chronic ulcer of other part of left lower leg with fat layer exposed Chronic venous hypertension (idiopathic) with ulcer of left lower extremity Polymyalgia rheumatica Personal history of systemic steroid therapy Patient presents to the clinic with a 9-week history of nonhealing wound to the left lower extremity secondary to trauma and nonhealing due to venous insufficiency and long-term use of steroids. No signs of infection. I debrided nonviable tissue and for now recommended Vashe wet-to-dry dressings to clean the wound bed better. She would benefit from an in office wrap Of Kerlix/Coban and will likely start this in the near future pending improvement. She does not have significant swelling on exam and right now would be okay with just doing a dressing change without compression. Procedures Wound #  4 Pre-procedure diagnosis of Wound #4 is a Trauma, Other located on the Left,Anterior Lower Leg . There was a Selective/Open Wound Non-Viable Tissue Debridement with a total area of 15.9 sq cm performed by Geralyn Corwin, DO. With the following instrument(s): Curette to remove Viable and Non-Viable Delval, Kerrigan T ([id number]) [id number].pdf Page 7 of 9 tissue/material. Material removed includes Eschar and Slough and after achieving pain control using Lidocaine 4% T opical Solution. No specimens were taken. A time out was conducted at 09:07, prior to the start of the procedure. A Minimum amount of bleeding was controlled with Pressure. The procedure was tolerated well with a pain level of 0 throughout and a pain level of 0 following the procedure. Post Debridement Measurements: 7.5cm length x 2.7cm width x 0.1cm depth; 1.59cm^3 volume. Character of Wound/Ulcer Post Debridement is stable. Post procedure Diagnosis Wound #4: Same as Pre-Procedure General Notes: Scribed for Dr Mikey Bussing by Brenton Grills RN.. Pre-procedure diagnosis of Wound #4 is a  Trauma, Other located on the Left,Anterior Lower Leg . There was a Three Layer Compression Therapy Procedure by Brenton Grills, RN. Post procedure Diagnosis Wound #4: Same as Pre-Procedure Notes: Scribed for Dr Mikey Bussing by Brenton Grills RN.Marland Kitchen Plan Follow-up Appointments: Return Appointment in 1 week. - Change dressings daily. Use 3 pieces of 4x4 gauze soaked in Vashe over the wound, then cover with 3 dry pieces of gauze. Wrap with Kerlix gauze and tape in place. Anesthetic: (In clinic) Topical Lidocaine 5% applied to wound bed Bathing/ Shower/ Hygiene: May shower and wash wound with soap and water. Edema Control - Lymphedema / SCD / Other: Elevate legs to the level of the heart or above for 30 minutes daily and/or when sitting for 3-4 times a day throughout the day. Avoid standing for long periods of time. WOUND #4: - Lower Leg Wound Laterality: Left, Anterior Cleanser: Vashe 5.8 (oz) 1 x Per Day/30 Days Discharge Instructions: Cleanse the wound with Vashe prior to applying a clean dressing using gauze sponges, not tissue or cotton balls. Secured With: Paper T ape, 2x10 (in/yd) (DME) (Generic) 1 x Per Day/30 Days Discharge Instructions: Secure dressing with tape as directed. Com pression Wrap: Kerlix Roll 4.5x3.1 (in/yd) (DME) (Generic) 1 x Per Day/30 Days Discharge Instructions: Apply Kerlix and Coban compression as directed. 1. In office sharp debridement 2. Vashe wet-to-dry dressings 3. Follow-up in 1 week Electronic Signature(s) Signed: 07/30/2022 12:25:26 PM By: Geralyn Corwin DO Entered By: Geralyn Corwin on 07/30/2022 09:52:32 -------------------------------------------------------------------------------- HxROS Details Patient Name: Date of Service: [name], [name] 07/30/2022 8:00 A M Medical Record Number: [id number] Patient Account Number: [id number] Date of Birth/Sex: Treating RN: [birth year] (89 y.o. Katrinka Blazing Primary Care Provider: [place], Betty Other  Clinician: Referring Provider: Treating Provider/Extender: Jakai Onofre [place], Betty Weeks in Treatment: 0 Information Obtained From Patient Eyes Medical History: Negative for: Cataracts; Glaucoma; Optic Neuritis Ear/Nose/Mouth/Throat Medical History: Negative for: Chronic sinus problems/congestion; Middle ear problems Hematologic/Lymphatic Medical History: Positive for: Anemia Macrae, Swathi T ([id number]) [phone].pdf Page 8 of 9 Negative for: Hemophilia; Human Immunodeficiency Virus; Lymphedema; Sickle Cell Disease Respiratory Medical History: Negative for: Aspiration; Asthma; Chronic Obstructive Pulmonary Disease (COPD); Pneumothorax; Sleep Apnea; Tuberculosis Cardiovascular Medical History: Positive for: Hypertension Negative for: Angina; Arrhythmia; Congestive Heart Failure; Coronary Artery Disease; Deep Vein Thrombosis; Hypotension; Myocardial Infarction; Peripheral Arterial Disease; Peripheral Venous Disease; Phlebitis; Vasculitis Gastrointestinal Medical History: Negative for: Cirrhosis ; Colitis; Crohns; Hepatitis A; Hepatitis B; Hepatitis C Endocrine Medical History: Negative for: Type I Diabetes; Type II  Diabetes Genitourinary Medical History: Negative for: End Stage Renal Disease Immunological Medical History: Negative for: Lupus Erythematosus; Raynauds; Scleroderma Integumentary (Skin) Medical History: Negative for: History of Burn Musculoskeletal Medical History: Positive for: Osteoarthritis Negative for: Gout; Rheumatoid Arthritis; Osteomyelitis Neurologic Medical History: Negative for: Dementia; Neuropathy; Quadriplegia; Paraplegia; Seizure Disorder Oncologic Medical History: Negative for: Received Chemotherapy; Received Radiation Psychiatric Medical History: Negative for: Anorexia/bulimia; Confinement Anxiety Immunizations Pneumococcal Vaccine: Received Pneumococcal Vaccination: No Implantable  Devices None Family and Social History Cancer: Yes - Father; Diabetes: No; Heart Disease: Yes - Father; Hereditary Spherocytosis: No; Hypertension: No; Lung Disease: Yes - Father; Seizures: No; Stroke: No; Thyroid Problems: No; Tuberculosis: No; Former smoker; Marital Status - Widowed; Alcohol Use: Moderate; Drug Use: No History; Caffeine Use: Moderate; Financial Concerns: No; Food, Clothing or Shelter Needs: No; Support System Lacking: No; Transportation Concerns: No Electronic Signature(s) Signed: 07/30/2022 12:25:26 PM By: Geralyn Corwin DO Signed: 07/31/2022 11:32:43 AM By: Karie Schwalbe RN Signed: 08/01/2022 4:28:57 PM By: Thomasenia Bottoms, Vernida T ([id number]) Brenton Grills [id number] Page 9 of 9 Signed: 08/01/2022 4:28:57 PM By: Margaret Pyle By: Brenton Grills on 07/30/2022 08:15:30 -------------------------------------------------------------------------------- SuperBill Details Patient Name: Date of Service: [name] NDSO [name] 07/30/2022 Medical Record Number: [id number] Patient Account Number: [id number] Date of Birth/Sex: Treating RN: Sep 08, 1928 (89 y.o. [name] Primary Care Provider: [place], Betty Other Clinician: Referring Provider: Treating Provider/Extender: Anjelika Ausburn [place], Betty Weeks in Treatment: 0 Diagnosis Coding ICD-10 Codes Code Description T79.8XXA Other early complications of trauma, initial encounter L97.822 Non-pressure chronic ulcer of other part of left lower leg with fat layer exposed I87.312 Chronic venous hypertension (idiopathic) with ulcer of left lower extremity M35.3 Polymyalgia rheumatica Z92.241 Personal history of systemic steroid therapy Facility Procedures : CPT4 Code: 78295621 Description: 99204 - WOUND CARE VISIT-LEV 4 NEW PT Modifier: Quantity: 1 : CPT4 Code: 30865784 Description: 97597 - DEBRIDE WOUND 1ST 20 SQ CM OR < ICD-10 Diagnosis Description L97.822 Non-pressure chronic ulcer  of other part of left lower leg with fat layer expose I87.312 Chronic venous hypertension (idiopathic) with ulcer of left lower extremity Modifier: d Quantity: 1 Physician Procedures : CPT4 Code Description Modifier [id number] 99204 - WC PHYS LEVEL 4 - NEW PT 25 ICD-10 Diagnosis Description L97.822 Non-pressure chronic ulcer of other part of left lower leg with fat layer exposed I87.312 Chronic venous hypertension (idiopathic) with  ulcer of left lower extremity T79.8XXA Other early complications of trauma, initial encounter Z92.241 Personal history of systemic steroid therapy Quantity: 1 : [id number] 97597 - WC PHYS DEBR WO ANESTH 20 SQ CM ICD-10 Diagnosis Description L97.822 Non-pressure chronic ulcer of other part of left lower leg with fat layer exposed I87.312 Chronic venous hypertension (idiopathic) with ulcer of left lower extremity Quantity: 1 Electronic Signature(s) Signed: 07/30/2022 12:25:26 PM By: Geralyn Corwin DO Entered By: Geralyn Corwin on 07/30/2022 09:53:06

## 2022-08-01 NOTE — Progress Notes (Signed)
[name], Alphia [name] ([id number]) [id number] Page 1 of 9 Visit Report for 07/30/2022 Allergy List Details Patient Name: Date of Service: [name], [name]. 07/30/2022 8:00 A M Medical Record Number: [id number] Patient Account Number: [id number] Date of Birth/Sex: Treating RN: Feb 01, 1928 (89 y.o. [name] Primary Care [name] Other Clinician: Referring [name]: Treating [name]/Extender: Hoffman, [name] [place], [name] Weeks in Treatment: 0 Allergies Active Allergies aspirin codeine Bactrim Allergy Notes Electronic Signature(s) Signed: 08/01/2022 4:28:57 PM By: Brenton Grills Entered By: Brenton Grills on 07/30/2022 08:15:04 -------------------------------------------------------------------------------- Arrival Information Details Patient Name: Date of Service: [name] NDSO [name] Ngo [name]. 07/30/2022 8:00 A M Medical Record Number: [id number] Patient Account Number: [id number] Date of Birth/Sex: Treating RN: 14-May-1928 (89 y.o. [name] Primary Care Abid Bolla: [place], [name] Other Clinician: Referring Kyana Aicher: Treating [name]/Extender: Hoffman, [name] [place], [name] Weeks in Treatment: 0 Visit Information Patient Arrived: Ambulatory Arrival Time: 08:05 Accompanied By: daughter Transfer Assistance: EasyPivot Patient Lift History Since Last Visit All ordered tests and consults were completed: Yes Added or deleted any medications: No Any new allergies or adverse reactions: No Had a fall or experienced change in activities of daily living that may affect risk of falls: No Signs or symptoms of abuse/neglect since last visito No Hospitalized since last visit: No Implantable device outside of the clinic excluding cellular tissue based products placed in the center since last visit: No Has Dressing in Place as Prescribed: Yes Pain Present Now: No Electronic Signature(s) Signed: 08/01/2022 4:28:57 PM By: Brenton Grills Entered By: Brenton Grills on 07/30/2022 08:13:16 Heinicke, [name] ([id number]) [id number] Page 2 of 9 -------------------------------------------------------------------------------- Clinic Level of Care Assessment Details Patient Name: Date of Service: [name], [name]. 07/30/2022 8:00 A M Medical Record Number: [id number] Patient Account Number: [id number] Date of Birth/Sex: Treating RN: 09/12/1928 (89 y.o. [name] Primary Care Amey Hossain: [place], [name] Other Clinician: Referring Siniyah Evangelist: Treating [name]/Extender: Hoffman, [name] [place], [name] Weeks in Treatment: 0 Clinic Level of Care Assessment Items TOOL 1 Quantity Score X- 1 0 Use when EandM and Procedure is performed on INITIAL visit ASSESSMENTS - Nursing Assessment / Reassessment X- 1 20 General Physical Exam (combine w/ comprehensive assessment (listed just below) when performed on new pt. evals) X- 1 25 Comprehensive Assessment (HX, ROS, Risk Assessments, Wounds Hx, etc.) ASSESSMENTS - Wound and Skin Assessment / Reassessment X- 1 10 Dermatologic / Skin Assessment (not related to wound area) ASSESSMENTS - Ostomy and/or Continence Assessment and Care []  - 0 Incontinence Assessment and Management []  - 0 Ostomy Care Assessment and Management (repouching, etc.) PROCESS - Coordination of Care X - Simple Patient / Family Education for ongoing care 1 15 []  - 0 Complex (extensive) Patient / Family Education for ongoing care X- 1 10 Staff obtains Chiropractor, Records, [name] Results / Process Orders est X- 1 10 Staff telephones HHA, Nursing Homes / Clarify orders / etc []  - 0 Routine Transfer to another Facility (non-emergent condition) []  - 0 Routine Hospital Admission (non-emergent condition) []  - 0 New Admissions / Manufacturing engineer / Ordering NPWT Apligraf, etc. , []  - 0 Emergency Hospital Admission (emergent condition) PROCESS - Special Needs []  - 0 Pediatric  / Minor Patient Management []  - 0 Isolation Patient Management X- 1 15 Hearing / Language / Visual special needs []  - 0 Assessment of Community assistance (transportation, D/C planning, etc.) []  - 0 Additional assistance / Altered mentation []  - 0 Support Surface(s) Assessment (bed, cushion, seat, etc.) INTERVENTIONS - Miscellaneous []  - 0  External ear exam []  - 0 Patient Transfer (multiple staff / Nurse, adult / Similar devices) []  - 0 Simple Staple / Suture removal (25 or less) []  - 0 Complex Staple / Suture removal (26 or more) []  - 0 Hypo/Hyperglycemic Management (do not check if billed separately) X- 1 15 Ankle / Brachial Index (ABI) - do not check if billed separately Has the patient been seen at the hospital within the last three years: Yes Total Score: 120 Level Of Care: New/Established - Level 4 Electronic Signature(s) Signed: 08/01/2022 4:28:57 PM By: Thomasenia Bottoms, [name] ([id number]) Brenton Grills [id number] Page 3 of 9 Signed: 08/01/2022 4:28:57 PM By: Margaret Pyle By: Brenton Grills on 07/30/2022 08:29:44 -------------------------------------------------------------------------------- Compression Therapy Details Patient Name: Date of Service: [name], [name]. 07/30/2022 8:00 A M Medical Record Number: [id number] Patient Account Number: [id number] Date of Birth/Sex: Treating RN: Mar 31, 1928 (89 y.o. [name] Primary Care Yakub Lodes: [place], [name] Other Clinician: Referring Archie Shea: Treating Taveon Enyeart/Extender: Hoffman, [name] [place], [name] Weeks in Treatment: 0 Compression Therapy Performed for Wound Assessment: Wound #4 Left,Anterior Lower Leg Performed By: Clinician Brenton Grills, RN Compression Type: Three Layer Post Procedure Diagnosis Same as Pre-procedure Notes Scribed for Dr Mikey Bussing by Brenton Grills RN. Electronic Signature(s) Signed: 08/01/2022 4:28:57 PM By: Brenton Grills Entered By: Brenton Grills  on 07/30/2022 09:10:11 -------------------------------------------------------------------------------- Encounter Discharge Information Details Patient Name: Date of Service: [name] NDSO [name] Ngo [name]. 07/30/2022 8:00 A M Medical Record Number: [id number] Patient Account Number: [id number] Date of Birth/Sex: Treating RN: 12/19/1928 (89 y.o. [name] Primary Care Aaran Enberg: [place], [name] Other Clinician: Referring Mohanad Carsten: Treating Cayton Cuevas/Extender: Hoffman, [name] [place], [name] Weeks in Treatment: 0 Encounter Discharge Information Items Post Procedure Vitals Discharge Condition: Stable Temperature (F): 98.2 Ambulatory Status: Ambulatory Pulse (bpm): 78 Discharge Destination: Home Respiratory Rate (breaths/min): 18 Transportation: Private Auto Blood Pressure (mmHg): 130/64 Accompanied By: daughter Schedule Follow-up Appointment: Yes Clinical Summary of Care: Patient Declined Electronic Signature(s) Signed: 08/01/2022 4:28:57 PM By: Brenton Grills Entered By: Brenton Grills on 07/30/2022 09:33:40 -------------------------------------------------------------------------------- Lower Extremity Assessment Details Patient Name: Date of Service: [name], [name]. 07/30/2022 8:00 A [place], Petra [name] ([id number]) [id number] Page 4 of 9 Medical Record Number: [id number] Patient Account Number: [id number] Date of Birth/Sex: Treating RN: [birth year] (89 y.o. Orville Govern Primary Care Ruthanna Macchia: [place], [name] Other Clinician: Referring Christorpher Hisaw: Treating Conor Filsaime/Extender: Hoffman, [name] [place], [name] Weeks in Treatment: 0 Edema Assessment Assessed: [Left: Yes] [Right: No] [Left: Edema] [Right: :] Calf Left: Right: Point of Measurement: 27 cm From Medial Instep 28 cm Ankle Left: Right: Point of Measurement: 10 cm From Medial Instep 17 cm Knee To Floor Left: Right: From Medial Instep 39 cm Vascular Assessment Pulses: Dorsalis  Pedis Palpable: [Left:Yes] Electronic Signature(s) Signed: 07/30/2022 11:42:02 AM By: Redmond Pulling RN, [name] Entered By: Redmond Pulling on 07/30/2022 08:25:09 -------------------------------------------------------------------------------- Multi Wound Chart Details Patient Name: Date of Service: [name] NDSO [name] Ngo [name]. 07/30/2022 8:00 A M Medical Record Number: [id number] Patient Account Number: [id number] Date of Birth/Sex: Treating RN: 05/26/1928 (89 y.o. F) Primary Care Kamren Heintzelman: [place], [name] Other Clinician: Referring Kamonte Mcmichen: Treating Kenyia W[place]/Extender: Hoffman, [name] [place], [name] Weeks in Treatment: 0 Vital Signs Height(in): 60 Pulse(bpm): 75 Weight(lbs): 97 Blood Pressure(mmHg): 182/84 Body Mass Index(BMI): 18.9 Temperature(F): 98.4 Respiratory Rate(breaths/min): 18 [4:Photos:] [[name]/A:[name]/A] Left, Anterior Lower Leg [name]/A [name]/A Wound Location: Skin Tear/Laceration [name]/A [name]/A Wounding Event: Trauma, Other [name]/A [name]/A Primary Etiology: Anemia, Hypertension, Osteoarthritis [name]/A [name]/A Comorbid History: Hoots, Annaliz [name] ([id number]) [id number] Page 5 of 9  05/22/2022 [name]/A [name]/A Date Acquired: 0 [name]/A [name]/A Weeks of Treatment: Open [name]/A [name]/A Wound Status: No [name]/A [name]/A Wound Recurrence: 7.5x2.7x0.2 [name]/A [name]/A Measurements L x W x D (cm) 15.904 [name]/A [name]/A A (cm) : rea 3.181 [name]/A [name]/A Volume (cm) : Full Thickness With Exposed Support [name]/A [name]/A Classification: Structures Medium [name]/A [name]/A Exudate A mount: Serosanguineous [name]/A [name]/A Exudate Type: red, brown [name]/A [name]/A Exudate Color: Small (1-33%) [name]/A [name]/A Granulation A mount: Red [name]/A [name]/A Granulation Quality: Large (67-100%) [name]/A [name]/A Necrotic A mount: Eschar, Adherent Slough [name]/A [name]/A Necrotic Tissue: Fat Layer (Subcutaneous Tissue): Yes [name]/A [name]/A Exposed Structures: Fascia: No Tendon: No Muscle: No Joint: No Bone: No None [name]/A [name]/A Epithelialization: Debridement - Selective/Open Wound [name]/A [name]/A Debridement: Pre-procedure  Verification/Time Out 09:07 [name]/A [name]/A Taken: Lidocaine 4% Topical Solution [name]/A [name]/A Pain Control: Necrotic/Eschar, Slough [name]/A [name]/A Tissue Debrided: Non-Viable Tissue [name]/A [name]/A Level: 15.9 [name]/A [name]/A Debridement A (sq cm): rea Curette [name]/A [name]/A Instrument: Minimum [name]/A [name]/A Bleeding: Pressure [name]/A [name]/A Hemostasis A chieved: 0 [name]/A [name]/A Procedural Pain: 0 [name]/A [name]/A Post Procedural Pain: Procedure was tolerated well [name]/A [name]/A Debridement Treatment Response: 7.5x2.7x0.1 [name]/A [name]/A Post Debridement Measurements L x W x D (cm) 1.59 [name]/A [name]/A Post Debridement Volume: (cm) Compression Therapy [name]/A [name]/A Procedures Performed: Debridement Treatment Notes Wound #4 (Lower Leg) Wound Laterality: Left, Anterior Cleanser Vashe 5.8 (oz) Discharge Instruction: Cleanse the wound with Vashe prior to applying a clean dressing using gauze sponges, not tissue or cotton balls. Peri-Wound Care Topical Primary Dressing Secondary Dressing Secured With Paper Tape, 2x10 (in/yd) Discharge Instruction: Secure dressing with tape as directed. Compression Wrap Kerlix Roll 4.5x3.1 (in/yd) Discharge Instruction: Apply Kerlix and Coban compression as directed. Compression Stockings Add-Ons Electronic Signature(s) Signed: 07/30/2022 12:25:26 PM By: Geralyn Corwin DO Entered By: Geralyn Corwin on 07/30/2022 09:47:20 Logue, Leida [name] ([id number]) [id number] Page 6 of 9 -------------------------------------------------------------------------------- Multi-Disciplinary Care Plan Details Patient Name: Date of Service: [name], [name]. 07/30/2022 8:00 A M Medical Record Number: [id number] Patient Account Number: [id number] Date of Birth/Sex: Treating RN: 01-Apr-1928 (89 y.o. [name] Primary Care Terrica Duecker: [place], [name] Other Clinician: Referring Laney Bagshaw: Treating Renaye Janicki/Extender: Hoffman, [name] [place], [name] Weeks in Treatment: 0 Active Inactive Wound/Skin  Impairment Nursing Diagnoses: Impaired tissue integrity Goals: Patient/caregiver will verbalize understanding of skin care regimen Date Initiated: 07/30/2022 Target Resolution Date: 09/22/2022 Goal Status: Active Interventions: Assess patient/caregiver ability to obtain necessary supplies Assess patient/caregiver ability to perform ulcer/skin care regimen upon admission and as needed Assess ulceration(s) every visit Provide education on ulcer and skin care Screen for HBO Treatment Activities: Skin care regimen initiated : 07/30/2022 Topical wound management initiated : 07/30/2022 Notes: Electronic Signature(s) Signed: 08/01/2022 4:28:57 PM By: Brenton Grills Entered By: Brenton Grills on 07/30/2022 08:27:34 -------------------------------------------------------------------------------- Pain Assessment Details Patient Name: Date of Service: [name] NDSO [name] Ngo [name]. 07/30/2022 8:00 A M Medical Record Number: [id number] Patient Account Number: [id number] Date of Birth/Sex: Treating RN: [birth year] (89 y.o. [name] Primary Care Jovin Fester: [place], [name] Other Clinician: Referring Corynn Solberg: Treating Davidjames Blansett/Extender: Hoffman, [name] [place], [name] Weeks in Treatment: 0 Active Problems Location of Pain Severity and Description of Pain Patient Has Paino No Site Locations [place], [place] [name] ([id number]) [phone].pdf Page 7 of 9 Pain Management and Medication Current Pain Management: Electronic Signature(s) Signed: 08/01/2022 4:28:57 PM By: Brenton Grills Entered By: Brenton Grills on 07/30/2022 08:49:11 -------------------------------------------------------------------------------- Patient/Caregiver Education Details Patient Name: Date of Service: [name] NDSO [name] Ngo [name]. 7/8/2024andnbsp8:00 A M Medical Record Number: [id number] Patient Account  Number: [id number] Date of Birth/Gender: Treating RN: 1928/06/14 (89 y.o. [name] Primary Care  Physician: [place], [name] Other Clinician: Referring Physician: Treating Physician/Extender: Hoffman, [name] [place], [name] Weeks in Treatment: 0 Education Assessment Education Provided To: Patient and Caregiver Education Topics Provided Wound/Skin Impairment: Methods: Explain/Verbal Responses: State content correctly Electronic Signature(s) Signed: 08/01/2022 4:28:57 PM By: Brenton Grills Entered By: Brenton Grills on 07/30/2022 08:27:59 -------------------------------------------------------------------------------- Wound Assessment Details Patient Name: Date of Service: [name] NDSO [name] Ngo [name]. 07/30/2022 8:00 A M Medical Record Number: [id number] Patient Account Number: [id number] Date of Birth/Sex: Treating RN: September 01, 1928 (89 y.o. Orville Govern Primary Care Hence Derrick: [place], [name] Other Clinician: Referring Hannibal Skalla: Treating Evony Rezek/Extender: Hoffman, [name] [place], [name] [facility], Arla [name] ([id number]) [id number] Page 8 of 9 Weeks in Treatment: 0 Wound Status Wound Number: 4 Primary Etiology: Trauma, Other Wound Location: Left, Anterior Lower Leg Wound Status: Open Wounding Event: Skin Tear/Laceration Comorbid History: Anemia, Hypertension, Osteoarthritis Date Acquired: 05/22/2022 Weeks Of Treatment: 0 Clustered Wound: No Photos Wound Measurements Length: (cm) 7.5 Width: (cm) 2.7 Depth: (cm) 0.2 Area: (cm) 15.904 Volume: (cm) 3.181 % Reduction in Area: % Reduction in Volume: Epithelialization: None Tunneling: No Undermining: No Wound Description Classification: Full Thickness With Exposed Support Structures Exudate Amount: Medium Exudate Type: Serosanguineous Exudate Color: red, brown Foul Odor After Cleansing: No Slough/Fibrino Yes Wound Bed Granulation Amount: Small (1-33%) Exposed Structure Granulation Quality: Red Fascia Exposed: No Necrotic Amount: Large (67-100%) Fat Layer (Subcutaneous Tissue) Exposed: Yes Necrotic  Quality: Eschar, Adherent Slough Tendon Exposed: No Muscle Exposed: No Joint Exposed: No Bone Exposed: No Periwound Skin Texture Texture Color No Abnormalities Noted: No No Abnormalities Noted: No Moisture No Abnormalities Noted: No Treatment Notes Wound #4 (Lower Leg) Wound Laterality: Left, Anterior Cleanser Vashe 5.8 (oz) Discharge Instruction: Cleanse the wound with Vashe prior to applying a clean dressing using gauze sponges, not tissue or cotton balls. Peri-Wound Care Topical Primary Dressing Secondary Dressing Secured With Paper Tape, 2x10 (in/yd) Discharge Instruction: Secure dressing with tape as directed. Compression Wrap Kerlix Roll 4.5x3.1 (in/yd) [place], [place] [name] ([id number]) [phone].pdf Page 9 of 9 Discharge Instruction: Apply Kerlix and Coban compression as directed. Compression Stockings Add-Ons Electronic Signature(s) Signed: 07/30/2022 11:42:02 AM By: Redmond Pulling RN, [name] Entered By: Redmond Pulling on 07/30/2022 08:28:53 -------------------------------------------------------------------------------- Vitals Details Patient Name: Date of Service: [name], Margo [name]. 07/30/2022 8:00 A M Medical Record Number: [id number] Patient Account Number: [id number] Date of Birth/Sex: Treating RN: 1928-08-09 (89 y.o. [name] Primary Care Jhamari Markowicz: [place], [name] Other Clinician: Referring Bedelia Pong: Treating Ensley Blas/Extender: Hoffman, [name] [place], [name] Weeks in Treatment: 0 Vital Signs Time Taken: 08:13 Temperature (F): 98.4 Height (in): 60 Pulse (bpm): 75 Weight (lbs): 97 Respiratory Rate (breaths/min): 18 Body Mass Index (BMI): 18.9 Blood Pressure (mmHg): 182/84 Reference Range: 80 - 120 mg / dl Electronic Signature(s) Signed: 08/01/2022 4:28:57 PM By: Brenton Grills Entered By: Brenton Grills on 07/30/2022 08:14:40

## 2022-08-06 ENCOUNTER — Encounter (HOSPITAL_BASED_OUTPATIENT_CLINIC_OR_DEPARTMENT_OTHER): Payer: Medicare Other | Attending: Internal Medicine | Admitting: Internal Medicine

## 2022-08-06 DIAGNOSIS — I87312 Chronic venous hypertension (idiopathic) with ulcer of left lower extremity: Secondary | ICD-10-CM | POA: Diagnosis not present

## 2022-08-06 DIAGNOSIS — X58XXXA Exposure to other specified factors, initial encounter: Secondary | ICD-10-CM | POA: Insufficient documentation

## 2022-08-06 DIAGNOSIS — J439 Emphysema, unspecified: Secondary | ICD-10-CM | POA: Diagnosis not present

## 2022-08-06 DIAGNOSIS — T798XXA Other early complications of trauma, initial encounter: Secondary | ICD-10-CM | POA: Insufficient documentation

## 2022-08-06 DIAGNOSIS — I10 Essential (primary) hypertension: Secondary | ICD-10-CM | POA: Diagnosis not present

## 2022-08-06 DIAGNOSIS — M353 Polymyalgia rheumatica: Secondary | ICD-10-CM | POA: Diagnosis not present

## 2022-08-06 DIAGNOSIS — L97822 Non-pressure chronic ulcer of other part of left lower leg with fat layer exposed: Secondary | ICD-10-CM | POA: Insufficient documentation

## 2022-08-06 DIAGNOSIS — Z7952 Long term (current) use of systemic steroids: Secondary | ICD-10-CM | POA: Diagnosis not present

## 2022-08-06 DIAGNOSIS — Z92241 Personal history of systemic steroid therapy: Secondary | ICD-10-CM | POA: Diagnosis not present

## 2022-08-07 NOTE — Progress Notes (Addendum)
[name], [name] ([id number]) [id number] Page 1 of 8 Visit Report for 08/06/2022 Chief Complaint Document Details Patient Name: Date of Service: [name] [name]. 08/06/2022 2:30 PM Medical Record Number: [id number] Patient Account Number: [id number] Date of Birth/Sex: Treating RN: [birth year] (89 y.o. F) Primary Care Provider: [place], Betty Other Clinician: Referring Provider: Treating Provider/Extender: Valetta Mulroy [place], Betty Weeks in Treatment: 1 Information Obtained from: Patient Chief Complaint 07/30/2022; left lower extremity wound secondary to trauma Electronic Signature(s) Signed: 08/07/2022 4:51:26 PM By: Geralyn Corwin DO Entered By: Geralyn Corwin on 08/06/2022 15:23:13 -------------------------------------------------------------------------------- HPI Details Patient Name: Date of Service: [name] NDSO [name], [name]. 08/06/2022 2:30 PM Medical Record Number: [id number] Patient Account Number: [id number] Date of Birth/Sex: Treating RN: 1929/01/12 (89 y.o. F) Primary Care Provider: [place], Betty Other Clinician: Referring Provider: Treating Provider/Extender: Jacek Colson [place], Betty Weeks in Treatment: 1 History of Present Illness HPI Description: ADMISSION 10/06/2018 This is an 89 year old woman who is here for wounds on her bilateral anterior lower legs. She was previously seen in early 2017 with a wound on her left leg that was traumatic. Felt to have lymphedema and venous insufficiency at that time although she had a reflux study that did not show significant superficial reflux in the left lower extremity. This healed. At some point she has had stockings prescribed but she cannot get the standard stocking on. The patient is also on chronic prednisone I think related to polymyalgia rheumatica currently at 2.5 mg daily. The patient states that in July she slipped out of bed traumatizing the anterior part of her leg. She was  seen by her primary care physicians beginning on 08/14/2018. It was noted to have some erythema and swelling she was given Keflex. On 7/30 she had doxycycline and I think he has had perhaps another round of doxycycline after this. Noted in their notes to have a history of chronic venous insufficiency and some edema. Sometime in August she traumatized her left anterior leg with the tip of her cane and has an open wound in this area as well. She has not been doing anything specific to the wounds themselves leaving them open to air and putting Band-Aids on top. Past medical history; hypertension, polymyalgia rheumatica on chronic prednisone, history of microdiscectomy, B12 deficiency, vitamin D deficiency, emphysema, osteoporosis ABIs in our clinic were 0.96 on the right and 0.99 on the left. 9/22; 2 small wounds on the left anterior and right anterior tibial area. These are better than last week. We put her in 3 layer compression unfortunately she appears to have a new wound on the medial right lower leg anteriorly probably because of wrap injury. She is on chronic prednisone with a history of chronic polymyalgia rheumatica and has very frail skin on her lower extremities 9/29; 2 small wounds on the left anterior and right anterior tibial areas these continue to improve. The abrasion she had from last week appears to have closed over. 10/6; the patient had 2 wounds on the left anterior and right anterior tibial wounds. The left anterior wound is just about closed. Right anterior tibial is still open. 10/13; the patient's leg is healed on the left as predicted. The area on the right is still open but measuring smaller 10/20; the patient comes in today with a small area on the left medial lower leg. The area on the right is still open but measuring smaller. We had put her in a dual layer stocking last week on the left she is not  able to get the 2 layers on she will only wear 1 layer. 10/27; the left  medial lower leg is closed. The right one is just about closed. We continue to dress the right leg and compression we put her 11/5; the left medial leg is closed and she has a juxta lite stocking on although her daughter had to apply it. The right one is closed today and we will put a juxta lite stocking on this leg as well. The patient states most of the skin changes are due to chronic steroid use. [name] the point this is true but she has tightly fibrotic o [name] ([id number]) [id number] Page 2 of 8 skin in her distal lower extremities suggesting some degree of chronic venous insufficiency as well. She does not have a lot of edema in her leg 07/30/2022 [name] is a 89 year old female with a past medical history of essential hypertension, polymyalgia rheumatica on chronic steroids, and venous insufficiency that presents to the clinic for a 9-week history of nonhealing wound to the left lower extremities. She states she hit her leg against a table and developed a hematoma that eventually opened and drained. She has been using hydrogen peroxide to the wound bed. She currently denies signs of infection. She has not been on antibiotics. She has compression stockings at home but has difficulty putting these on. She states she cannot even put on a sock. 7/15; patient presents for follow-up. She has been using Vashe wet-to-dry dressings although technique is questionable. She has left this open to air for several days because she did not think she had the right gauze to use for the wet-to-dry dressing. Electronic Signature(s) Signed: 08/07/2022 4:51:26 PM By: Geralyn Corwin DO Entered By: Geralyn Corwin on 08/06/2022 15:24:02 -------------------------------------------------------------------------------- Physical Exam Details Patient Name: Date of Service: [name] NDSO [name]. 08/06/2022 2:30 PM Medical Record Number: [id number] Patient Account  Number: [id number] Date of Birth/Sex: Treating RN: 09-19-1928 (89 y.o. F) Primary Care Provider: [place], Betty Other Clinician: Referring Provider: Treating Provider/Extender: Alva Broxson [place], Betty Weeks in Treatment: 1 Constitutional respirations regular, non-labored and within target range for patient.. Cardiovascular 2+ dorsalis pedis/posterior tibialis pulses. Psychiatric pleasant and cooperative. Notes Left lower extremity: [name] the anterior aspect there is an open wound with nonviable tissue throughout. No signs of surrounding infection. Significant venous o stasis dermatitis noted throughout the leg. Electronic Signature(s) Signed: 08/07/2022 4:51:26 PM By: Geralyn Corwin DO Entered By: Geralyn Corwin on 08/06/2022 15:24:39 -------------------------------------------------------------------------------- Physician Orders Details Patient Name: Date of Service: [name] NDSO [name]. 08/06/2022 2:30 PM Medical Record Number: [id number] Patient Account Number: [id number] Date of Birth/Sex: Treating RN: 1928/04/27 (89 y.o. Fredderick Phenix Primary Care Provider: [place], Betty Other Clinician: Referring Provider: Treating Provider/Extender: Melessa Cowell [place], Betty Weeks in Treatment: 1 Verbal / Phone Orders: No Diagnosis Coding Follow-up Appointments ppointment in 1 week. - Dr. Mikey Bussing - room 9 Return A Bathing/ Shower/ Hygiene May shower and wash wound with soap and water. [name], [name] ([id number]) [id number] Page 3 of 8 Edema Control - Lymphedema / SCD / Other Bilateral Lower Extremities Elevate legs to the level of the heart or above for 30 minutes daily and/or when sitting for 3-4 times a day throughout the day. Avoid standing for long periods of time. Wound Treatment Wound #4 - Lower Leg Wound Laterality: Left, Anterior Cleanser: Vashe 5.8 (oz) 1 x Per Day/30 Days Discharge Instructions: Cleanse the wound with  Vashe prior to applying a clean dressing  using gauze sponges, not tissue or cotton balls. Secondary Dressing: Woven Gauze Sponge, Non-Sterile 4x4 in 1 x Per Day/30 Days Discharge Instructions: Apply over primary dressing as directed. Secured With: Paper Tape, 2x10 (in/yd) (Generic) 1 x Per Day/30 Days Discharge Instructions: Secure dressing with tape as directed. Compression Wrap: Kerlix Roll 4.5x3.1 (in/yd) (Generic) 1 x Per Day/30 Days Discharge Instructions: Apply Kerlix and Coban compression as directed. Consults Vascular - formal ABIs and TBIs bilaterally Electronic Signature(s) Signed: 08/07/2022 4:51:26 PM By: Geralyn Corwin DO Entered By: Geralyn Corwin on 08/06/2022 15:24:48 -------------------------------------------------------------------------------- Problem List Details Patient Name: Date of Service: [name] NDSO [name]. 08/06/2022 2:30 PM Medical Record Number: [id number] Patient Account Number: [id number] Date of Birth/Sex: Treating RN: April 06, 1928 (89 y.o. F) Primary Care Provider: [place], Betty Other Clinician: Referring Provider: Treating Provider/Extender: Jager Koska [place], Betty Weeks in Treatment: 1 Active Problems ICD-10 Encounter Code Description Active Date MDM Diagnosis T79.8XXA Other early complications of trauma, initial encounter 07/30/2022 No Yes L97.822 Non-pressure chronic ulcer of other part of left lower leg with fat layer exposed7/08/2022 No Yes I87.312 Chronic venous hypertension (idiopathic) with ulcer of left lower extremity 07/30/2022 No Yes M35.3 Polymyalgia rheumatica 07/30/2022 No Yes Z92.241 Personal history of systemic steroid therapy 07/30/2022 No Yes Inactive Problems Resolved Problems Hunnell, Dania [name] ([id number]) [id number] Page 4 of 8 Electronic Signature(s) Signed: 08/07/2022 4:51:26 PM By: Geralyn Corwin DO Entered By: Geralyn Corwin on 08/06/2022  15:23:01 -------------------------------------------------------------------------------- Progress Note Details Patient Name: Date of Service: [name] NDSO [name]. 08/06/2022 2:30 PM Medical Record Number: [id number] Patient Account Number: [id number] Date of Birth/Sex: Treating RN: 01/08/1929 (89 y.o. F) Primary Care Provider: [place], Betty Other Clinician: Referring Provider: Treating Provider/Extender: Amie Cowens [place], Betty Weeks in Treatment: 1 Subjective Chief Complaint Information obtained from Patient 07/30/2022; left lower extremity wound secondary to trauma History of Present Illness (HPI) ADMISSION 10/06/2018 This is an 89 year old woman who is here for wounds on her bilateral anterior lower legs. She was previously seen in early 2017 with a wound on her left leg that was traumatic. Felt to have lymphedema and venous insufficiency at that time although she had a reflux study that did not show significant superficial reflux in the left lower extremity. This healed. At some point she has had stockings prescribed but she cannot get the standard stocking on. The patient is also on chronic prednisone I think related to polymyalgia rheumatica currently at 2.5 mg daily. The patient states that in July she slipped out of bed traumatizing the anterior part of her leg. She was seen by her primary care physicians beginning on 08/14/2018. It was noted to have some erythema and swelling she was given Keflex. On 7/30 she had doxycycline and I think he has had perhaps another round of doxycycline after this. Noted in their notes to have a history of chronic venous insufficiency and some edema. Sometime in August she traumatized her left anterior leg with the tip of her cane and has an open wound in this area as well. She has not been doing anything specific to the wounds themselves leaving them open to air and putting Band-Aids on top. Past medical history; hypertension, polymyalgia  rheumatica on chronic prednisone, history of microdiscectomy, B12 deficiency, vitamin D deficiency, emphysema, osteoporosis ABIs in our clinic were 0.96 on the right and 0.99 on the left. 9/22; 2 small wounds on the left anterior and right anterior tibial area. These are better than last week. We put her in 3 layer compression  unfortunately she appears to have a new wound on the medial right lower leg anteriorly probably because of wrap injury. She is on chronic prednisone with a history of chronic polymyalgia rheumatica and has very frail skin on her lower extremities 9/29; 2 small wounds on the left anterior and right anterior tibial areas these continue to improve. The abrasion she had from last week appears to have closed over. 10/6; the patient had 2 wounds on the left anterior and right anterior tibial wounds. The left anterior wound is just about closed. Right anterior tibial is still open. 10/13; the patient's leg is healed on the left as predicted. The area on the right is still open but measuring smaller 10/20; the patient comes in today with a small area on the left medial lower leg. The area on the right is still open but measuring smaller. We had put her in a dual layer stocking last week on the left she is not able to get the 2 layers on she will only wear 1 layer. 10/27; the left medial lower leg is closed. The right one is just about closed. We continue to dress the right leg and compression we put her 11/5; the left medial leg is closed and she has a juxta lite stocking on although her daughter had to apply it. The right one is closed today and we will put a juxta lite stocking on this leg as well. The patient states most of the skin changes are due to chronic steroid use. [name] the point this is true but she has tightly fibrotic o skin in her distal lower extremities suggesting some degree of chronic venous insufficiency as well. She does not have a lot of edema in her  leg 07/30/2022 [name] is a 89 year old female with a past medical history of essential hypertension, polymyalgia rheumatica on chronic steroids, and venous insufficiency that presents to the clinic for a 9-week history of nonhealing wound to the left lower extremities. She states she hit her leg against a table and developed a hematoma that eventually opened and drained. She has been using hydrogen peroxide to the wound bed. She currently denies signs of infection. She has not been on antibiotics. She has compression stockings at home but has difficulty putting these on. She states she cannot even put on a sock. 7/15; patient presents for follow-up. She has been using Vashe wet-to-dry dressings although technique is questionable. She has left this open to air for several days because she did not think she had the right gauze to use for the wet-to-dry dressing. Patient History Information obtained from Patient. Family History Cancer - Father, Heart Disease - Father, Lung Disease - Father, No family history of Diabetes, Hereditary Spherocytosis, Hypertension, Seizures, Stroke, Thyroid Problems, Tuberculosis. Social History Former smoker, Marital Status - Widowed, Alcohol Use - Moderate, Drug Use - No History, Caffeine Use - Moderate. Medical History [name], [name] ([id number]) [id number] Page 5 of 8 Eyes Denies history of Cataracts, Glaucoma, Optic Neuritis Ear/Nose/Mouth/Throat Denies history of Chronic sinus problems/congestion, Middle ear problems Hematologic/Lymphatic Patient has history of Anemia Denies history of Hemophilia, Human Immunodeficiency Virus, Lymphedema, Sickle Cell Disease Respiratory Denies history of Aspiration, Asthma, Chronic Obstructive Pulmonary Disease (COPD), Pneumothorax, Sleep Apnea, Tuberculosis Cardiovascular Patient has history of Hypertension Denies history of Angina, Arrhythmia, Congestive Heart Failure,  Coronary Artery Disease, Deep Vein Thrombosis, Hypotension, Myocardial Infarction, Peripheral Arterial Disease, Peripheral Venous Disease, Phlebitis, Vasculitis Gastrointestinal Denies history of Cirrhosis , Colitis, Crohns, Hepatitis A, Hepatitis  B, Hepatitis C Endocrine Denies history of Type I Diabetes, Type II Diabetes Genitourinary Denies history of End Stage Renal Disease Immunological Denies history of Lupus Erythematosus, Raynauds, Scleroderma Integumentary (Skin) Denies history of History of Burn Musculoskeletal Patient has history of Osteoarthritis Denies history of Gout, Rheumatoid Arthritis, Osteomyelitis Neurologic Denies history of Dementia, Neuropathy, Quadriplegia, Paraplegia, Seizure Disorder Oncologic Denies history of Received Chemotherapy, Received Radiation Psychiatric Denies history of Anorexia/bulimia, Confinement Anxiety Objective Constitutional respirations regular, non-labored and within target range for patient.. Vitals Time Taken: 2:41 PM, Height: 60 in, Weight: 97 lbs, BMI: 18.9, Temperature: 97.9 F, Pulse: 109 bpm, Respiratory Rate: 18 breaths/min, Blood Pressure: 150/61 mmHg. Cardiovascular 2+ dorsalis pedis/posterior tibialis pulses. Psychiatric pleasant and cooperative. General Notes: Left lower extremity: [name] the anterior aspect there is an open wound with nonviable tissue throughout. No signs of surrounding infection. o Significant venous stasis dermatitis noted throughout the leg. Integumentary (Hair, Skin) Wound #4 status is Open. Original cause of wound was Skin [name] ear/Laceration. The date acquired was: 05/22/2022. The wound has been in treatment 1 weeks. The wound is located on the Left,Anterior Lower Leg. The wound measures 7.1cm length x 2.6cm width x 0.2cm depth; 14.498cm^2 area and 2.9cm^3 volume. There is Fat Layer (Subcutaneous Tissue) exposed. There is no tunneling or undermining noted. There is a medium amount of serosanguineous drainage  noted. There is small (1-33%) red granulation within the wound bed. There is a large (67-100%) amount of necrotic tissue within the wound bed including Eschar and Adherent Slough. Assessment Active Problems ICD-10 Other early complications of trauma, initial encounter Non-pressure chronic ulcer of other part of left lower leg with fat layer exposed Chronic venous hypertension (idiopathic) with ulcer of left lower extremity Polymyalgia rheumatica Personal history of systemic steroid therapy Patient's wound is slightly smaller. There is still nonviable tissue throughout. I readdressed how to do a wet-to-dry dressing. I recommended she keep this covered and not leave it open to air. Her ABIs were noncompressible however she has palpable pedal pulses. We will go ahead and order ABIs with TBI's. For EDISON, BRENTON [name] ([id number]) [id number] Page 6 of 8 now continue Vashe wet-to-dry dressings. Follow-up in 1 week. Plan Follow-up Appointments: Return Appointment in 1 week. - Dr. Mikey Bussing - room 9 Bathing/ Shower/ Hygiene: May shower and wash wound with soap and water. Edema Control - Lymphedema / SCD / Other: Elevate legs to the level of the heart or above for 30 minutes daily and/or when sitting for 3-4 times a day throughout the day. Avoid standing for long periods of time. Consults ordered were: Vascular - formal ABIs and TBIs bilaterally WOUND #4: - Lower Leg Wound Laterality: Left, Anterior Cleanser: Vashe 5.8 (oz) 1 x Per Day/30 Days Discharge Instructions: Cleanse the wound with Vashe prior to applying a clean dressing using gauze sponges, not tissue or cotton balls. Secondary Dressing: Woven Gauze Sponge, Non-Sterile 4x4 in 1 x Per Day/30 Days Discharge Instructions: Apply over primary dressing as directed. Secured With: Paper [name] ape, 2x10 (in/yd) (Generic) 1 x Per Day/30 Days Discharge Instructions: Secure dressing with tape as directed. Com pression  Wrap: Kerlix Roll 4.5x3.1 (in/yd) (Generic) 1 x Per Day/30 Days Discharge Instructions: Apply Kerlix and Coban compression as directed. 1. Vashe wet-to-dry dressings 2. Order ABIs with TBI's 3. Follow-up in 1 week Electronic Signature(s) Signed: 08/07/2022 4:51:26 PM By: Geralyn Corwin DO Entered By: Geralyn Corwin on 08/06/2022 15:26:30 -------------------------------------------------------------------------------- HxROS Details Patient Name: Date of Service: [name] NDSO [name], [name]. 08/06/2022 2:30  PM Medical Record Number: [id number] Patient Account Number: [id number] Date of Birth/Sex: Treating RN: August 15, 1928 (89 y.o. F) Primary Care Provider: [place], Betty Other Clinician: Referring Provider: Treating Provider/Extender: Jaliel Deavers [place], Betty Weeks in Treatment: 1 Information Obtained From Patient Eyes Medical History: Negative for: Cataracts; Glaucoma; Optic Neuritis Ear/Nose/Mouth/Throat Medical History: Negative for: Chronic sinus problems/congestion; Middle ear problems Hematologic/Lymphatic Medical History: Positive for: Anemia Negative for: Hemophilia; Human Immunodeficiency Virus; Lymphedema; Sickle Cell Disease Respiratory Medical History: Negative for: Aspiration; Asthma; Chronic Obstructive Pulmonary Disease (COPD); Pneumothorax; Sleep Apnea; Tuberculosis Cardiovascular Medical History: Positive for: Hypertension Gabrielson, [name] ([id number]) [id number] Page 7 of 8 Negative for: Angina; Arrhythmia; Congestive Heart Failure; Coronary Artery Disease; Deep Vein Thrombosis; Hypotension; Myocardial Infarction; Peripheral Arterial Disease; Peripheral Venous Disease; Phlebitis; Vasculitis Gastrointestinal Medical History: Negative for: Cirrhosis ; Colitis; Crohns; Hepatitis A; Hepatitis B; Hepatitis C Endocrine Medical History: Negative for: Type I Diabetes; Type II Diabetes Genitourinary Medical History: Negative for:  End Stage Renal Disease Immunological Medical History: Negative for: Lupus Erythematosus; Raynauds; Scleroderma Integumentary (Skin) Medical History: Negative for: History of Burn Musculoskeletal Medical History: Positive for: Osteoarthritis Negative for: Gout; Rheumatoid Arthritis; Osteomyelitis Neurologic Medical History: Negative for: Dementia; Neuropathy; Quadriplegia; Paraplegia; Seizure Disorder Oncologic Medical History: Negative for: Received Chemotherapy; Received Radiation Psychiatric Medical History: Negative for: Anorexia/bulimia; Confinement Anxiety Immunizations Pneumococcal Vaccine: Received Pneumococcal Vaccination: No Implantable Devices None Family and Social History Cancer: Yes - Father; Diabetes: No; Heart Disease: Yes - Father; Hereditary Spherocytosis: No; Hypertension: No; Lung Disease: Yes - Father; Seizures: No; Stroke: No; Thyroid Problems: No; Tuberculosis: No; Former smoker; Marital Status - Widowed; Alcohol Use: Moderate; Drug Use: No History; Caffeine Use: Moderate; Financial Concerns: No; Food, Clothing or Shelter Needs: No; Support System Lacking: No; Transportation Concerns: No Electronic Signature(s) Signed: 08/07/2022 4:51:26 PM By: Geralyn Corwin DO Entered By: Geralyn Corwin on 08/06/2022 15:24:10 SuperBill Details -------------------------------------------------------------------------------- [name] ([id number]) [id number] Page 8 of 8 Patient Name: Date of Service: [name] NDSO [name], [name]. 08/06/2022 Medical Record Number: [id number] Patient Account Number: [id number] Date of Birth/Sex: Treating RN: [birth year] (89 y.o. F) Primary Care Provider: [place], Betty Other Clinician: Referring Provider: Treating Provider/Extender: Brodrick Curran [place], Betty Weeks in Treatment: 1 Diagnosis Coding ICD-10 Codes Code Description T79.8XXA Other early complications of trauma, initial  encounter L97.822 Non-pressure chronic ulcer of other part of left lower leg with fat layer exposed I87.312 Chronic venous hypertension (idiopathic) with ulcer of left lower extremity M35.3 Polymyalgia rheumatica Z92.241 Personal history of systemic steroid therapy Facility Procedures CPT4 Code Description Modifier Quantity [id number] - WOUND CARE VISIT-LEV 3 EST PT 1 Physician Procedures Quantity CPT4 Code Description Modifier [id number] - WC PHYS LEVEL 3 - EST PT 1 ICD-10 Diagnosis Description L97.822 Non-pressure chronic ulcer of other part of left lower leg with fat layer exposed I87.312 Chronic venous hypertension (idiopathic) with ulcer of left lower extremity T79.8XXA Other early complications of trauma, initial encounter Z92.241 Personal history of systemic steroid therapy Electronic Signature(s) Signed: 08/16/2022 10:58:25 AM By: Pearletha Alfred Signed: 08/16/2022 3:41:47 PM By: Geralyn Corwin DO Previous Signature: 08/16/2022 10:55:18 AM Version By: Pearletha Alfred Previous Signature: 08/07/2022 4:51:26 PM Version By: Geralyn Corwin DO Entered By: Pearletha Alfred on 08/16/2022 10:58:25

## 2022-08-07 NOTE — Progress Notes (Signed)
Porta, Illa T ([id number]) [id number] Page 1 of 8 Visit Report for 08/06/2022 Arrival Information Details Patient Name: Date of Service: [name] LAVILLA, DELAMORA T. 08/06/2022 2:30 PM Medical Record Number: [id number] Patient Account Number: [id number] Date of Birth/Sex: Treating RN: 1928/05/27 (89 y.o. F) Primary Care Rigdon Macomber: [place], Betty Other Clinician: Referring Constancia Geeting: Treating Sharilyn Geisinger/Extender: Hoffman, [name] [place], Betty Weeks in Treatment: 1 Visit Information History Since Last Visit Added or deleted any medications: No Patient Arrived: [name] Any new allergies or adverse reactions: No Arrival Time: 14:29 Had a fall or experienced change in No Accompanied By: daughter activities of daily living that may affect Transfer Assistance: None risk of falls: Patient Identification Verified: Yes Signs or symptoms of abuse/neglect since last visito No Secondary Verification Process Completed: Yes Hospitalized since last visit: No Implantable device outside of the clinic excluding No cellular tissue based products placed in the center since last visit: Has Dressing in Place as Prescribed: Yes Pain Present Now: No Electronic Signature(s) Signed: 08/06/2022 4:10:29 PM By: Thayer Dallas Entered By: Thayer Dallas on 08/06/2022 14:33:39 -------------------------------------------------------------------------------- Clinic Level of Care Assessment Details Patient Name: Date of Service: [name] SHANTA, HARTNER T. 08/06/2022 2:30 PM Medical Record Number: [id number] Patient Account Number: [id number] Date of Birth/Sex: Treating RN: 21-Dec-1928 (89 y.o. Fredderick Phenix Primary Care Kjersten Ormiston: [place], Betty Other Clinician: Referring Destany Severns: Treating Litzi Binning/Extender: Hoffman, [name] [place], Betty Weeks in Treatment: 1 Clinic Level of Care Assessment Items TOOL 4 Quantity Score X- 1 0 Use when only an EandM is performed on FOLLOW-UP  visit ASSESSMENTS - Nursing Assessment / Reassessment []  - 0 Reassessment of Co-morbidities (includes updates in patient status) X- 1 5 Reassessment of Adherence to Treatment Plan ASSESSMENTS - Wound and Skin A ssessment / Reassessment X - Simple Wound Assessment / Reassessment - one wound 1 5 []  - 0 Complex Wound Assessment / Reassessment - multiple wounds []  - 0 Dermatologic / Skin Assessment (not related to wound area) ASSESSMENTS - Focused Assessment X- 1 5 Circumferential Edema Measurements - multi extremities []  - 0 Nutritional Assessment / Counseling / Intervention Mcafee, Deasha T ([id number]) [id number] Page 2 of 8 X- 1 5 Lower Extremity Assessment (monofilament, tuning fork, pulses) []  - 0 Peripheral Arterial Disease Assessment (using hand held doppler) ASSESSMENTS - Ostomy and/or Continence Assessment and Care []  - 0 Incontinence Assessment and Management []  - 0 Ostomy Care Assessment and Management (repouching, etc.) PROCESS - Coordination of Care X - Simple Patient / Family Education for ongoing care 1 15 []  - 0 Complex (extensive) Patient / Family Education for ongoing care X- 1 10 Staff obtains Chiropractor, Records, T Results / Process Orders est []  - 0 Staff telephones HHA, Nursing Homes / Clarify orders / etc []  - 0 Routine Transfer to another Facility (non-emergent condition) []  - 0 Routine Hospital Admission (non-emergent condition) []  - 0 New Admissions / Manufacturing engineer / Ordering NPWT Apligraf, etc. , []  - 0 Emergency Hospital Admission (emergent condition) X- 1 10 Simple Discharge Coordination []  - 0 Complex (extensive) Discharge Coordination PROCESS - Special Needs []  - 0 Pediatric / Minor Patient Management []  - 0 Isolation Patient Management []  - 0 Hearing / Language / Visual special needs []  - 0 Assessment of Community assistance (transportation, D/C planning, etc.) []  - 0 Additional assistance /  Altered mentation []  - 0 Support Surface(s) Assessment (bed, cushion, seat, etc.) INTERVENTIONS - Wound Cleansing / Measurement X - Simple Wound Cleansing - one wound 1 5 []  - 0 Complex Wound Cleansing -  multiple wounds X- 1 5 Wound Imaging (photographs - any number of wounds) []  - 0 Wound Tracing (instead of photographs) X- 1 5 Simple Wound Measurement - one wound []  - 0 Complex Wound Measurement - multiple wounds INTERVENTIONS - Wound Dressings []  - 0 Small Wound Dressing one or multiple wounds X- 1 15 Medium Wound Dressing one or multiple wounds []  - 0 Large Wound Dressing one or multiple wounds []  - 0 Application of Medications - topical []  - 0 Application of Medications - injection INTERVENTIONS - Miscellaneous []  - 0 External ear exam []  - 0 Specimen Collection (cultures, biopsies, blood, body fluids, etc.) []  - 0 Specimen(s) / Culture(s) sent or taken to Lab for analysis []  - 0 Patient Transfer (multiple staff / Nurse, adult / Similar devices) []  - 0 Simple Staple / Suture removal (25 or less) []  - 0 Complex Staple / Suture removal (26 or more) []  - 0 Hypo / Hyperglycemic Management (close monitor of Blood Glucose) Redditt, Kylei T ([id number]) [id number] Page 3 of 8 []  - 0 Ankle / Brachial Index (ABI) - do not check if billed separately X- 1 5 Vital Signs Has the patient been seen at the hospital within the last three years: Yes Total Score: 90 Level Of Care: New/Established - Level 3 Electronic Signature(s) Signed: 08/06/2022 4:41:34 PM By: Samuella Bruin Entered By: Samuella Bruin on 08/06/2022 15:28:20 -------------------------------------------------------------------------------- Encounter Discharge Information Details Patient Name: Date of Service: [name] [name] 08/06/2022 2:30 PM Medical Record Number: [id number] Patient Account Number: [id number] Date of Birth/Sex: Treating RN: 1928-08-13 (89 y.o. Fredderick Phenix Primary Care Kobe Ofallon: [place], Betty Other Clinician: Referring Lawanda Holzheimer: Treating Damareon Lanni/Extender: Hoffman, [name] [place], Betty Weeks in Treatment: 1 Encounter Discharge Information Items Discharge Condition: Stable Ambulatory Status: Cane Discharge Destination: Home Transportation: Private Auto Accompanied By: daughter Schedule Follow-up Appointment: Yes Clinical Summary of Care: Patient Declined Electronic Signature(s) Signed: 08/06/2022 4:41:34 PM By: Samuella Bruin Entered By: Samuella Bruin on 08/06/2022 15:29:12 -------------------------------------------------------------------------------- Lower Extremity Assessment Details Patient Name: Date of Service: [name] [name] 08/06/2022 2:30 PM Medical Record Number: [id number] Patient Account Number: [id number] Date of Birth/Sex: Treating RN: 01-26-1928 (89 y.o. F) Primary Care Shayanna Thatch: [place], Betty Other Clinician: Referring Luigi Stuckey: Treating Tonantzin Mimnaugh/Extender: Hoffman, [name] [place], Betty Weeks in Treatment: 1 Edema Assessment Assessed: [Left: No] [Right: No] [Left: Edema] [Right: :] Calf Left: Right: Point of Measurement: 27 cm From Medial Instep 28.1 cm Ankle Left: Right: Point of Measurement: 10 cm From Medial Instep 18 cm Electronic Signature(s) Signed: 08/06/2022 4:10:29 PM By: Elmer Picker, Cathaleen T ([id number]) Thayer Dallas [phone].pdf Page 4 of 8 Signed: 08/06/2022 4:10:29 PM By: Margaret Pyle By: Thayer Dallas on 08/06/2022 14:43:25 -------------------------------------------------------------------------------- Multi Wound Chart Details Patient Name: Date of Service: [name] [name] 08/06/2022 2:30 PM Medical Record Number: [id number] Patient Account Number: [id number] Date of Birth/Sex: Treating RN: 12/28/1928 (89 y.o. F) Primary Care Alta Shober: [place], Betty Other Clinician: Referring Spruha Weight: Treating Jeralyn Nolden/Extender:  Hoffman, [name] [place], Betty Weeks in Treatment: 1 Vital Signs Height(in): 60 Pulse(bpm): 109 Weight(lbs): 97 Blood Pressure(mmHg): 150/61 Body Mass Index(BMI): 18.9 Temperature(F): 97.9 Respiratory Rate(breaths/min): 18 [4:Photos:] [N/A:N/A] Left, Anterior Lower Leg N/A N/A Wound Location: Skin T ear/Laceration N/A N/A Wounding Event: Trauma, Other N/A N/A Primary Etiology: Anemia, Hypertension, Osteoarthritis N/A N/A Comorbid History: 05/22/2022 N/A N/A Date Acquired: 1 N/A N/A Weeks of Treatment: Open N/A N/A Wound Status: No N/A N/A Wound Recurrence: 7.1x2.6x0.2 N/A N/A Measurements L x W x D (cm) 14.498  N/A N/A A (cm) : rea 2.9 N/A N/A Volume (cm) : 8.80% N/A N/A % Reduction in A rea: 8.80% N/A N/A % Reduction in Volume: Full Thickness With Exposed Support N/A N/A Classification: Structures Medium N/A N/A Exudate A mount: Serosanguineous N/A N/A Exudate Type: red, brown N/A N/A Exudate Color: Small (1-33%) N/A N/A Granulation Amount: Red N/A N/A Granulation Quality: Large (67-100%) N/A N/A Necrotic Amount: Eschar, Adherent Slough N/A N/A Necrotic Tissue: Fat Layer (Subcutaneous Tissue): Yes N/A N/A Exposed Structures: Fascia: No Tendon: No Muscle: No Joint: No Bone: No None N/A N/A Epithelialization: Treatment Notes Electronic Signature(s) Signed: 08/07/2022 4:51:26 PM By: Geralyn Corwin DO Entered By: Geralyn Corwin on 08/06/2022 15:23:06 Keagy, Megon T ([id number]) [id number] Page 5 of 8 -------------------------------------------------------------------------------- Multi-Disciplinary Care Plan Details Patient Name: Date of Service: [name] MARKIA, KYER T. 08/06/2022 2:30 PM Medical Record Number: [id number] Patient Account Number: [id number] Date of Birth/Sex: Treating RN: 06-May-1928 (89 y.o. Fredderick Phenix Primary Care Yanky Vanderburg: [place], Betty Other Clinician: Referring Abimbola Aki: Treating  Vermelle Cammarata/Extender: Hoffman, [name] [place], Betty Weeks in Treatment: 1 Active Inactive Wound/Skin Impairment Nursing Diagnoses: Impaired tissue integrity Goals: Patient/caregiver will verbalize understanding of skin care regimen Date Initiated: 07/30/2022 Target Resolution Date: 09/22/2022 Goal Status: Active Interventions: Assess patient/caregiver ability to obtain necessary supplies Assess patient/caregiver ability to perform ulcer/skin care regimen upon admission and as needed Assess ulceration(s) every visit Provide education on ulcer and skin care Screen for HBO Treatment Activities: Skin care regimen initiated : 07/30/2022 Topical wound management initiated : 07/30/2022 Notes: Electronic Signature(s) Signed: 08/06/2022 4:41:34 PM By: Samuella Bruin Entered By: Samuella Bruin on 08/06/2022 15:15:00 -------------------------------------------------------------------------------- Pain Assessment Details Patient Name: Date of Service: [name] [name] 08/06/2022 2:30 PM Medical Record Number: [id number] Patient Account Number: [id number] Date of Birth/Sex: Treating RN: 09-19-1928 (89 y.o. F) Primary Care Janith Nielson: [place], Betty Other Clinician: Referring Tatem Fesler: Treating Brittish Bolinger/Extender: Hoffman, [name] [place], Betty Weeks in Treatment: 1 Active Problems Location of Pain Severity and Description of Pain Patient Has Paino No Site Locations Green, Maple T ([id number]) [id number] Page 6 of 8 Pain Management and Medication Current Pain Management: Electronic Signature(s) Signed: 08/06/2022 4:10:29 PM By: Thayer Dallas Entered By: Thayer Dallas on 08/06/2022 14:43:05 -------------------------------------------------------------------------------- Patient/Caregiver Education Details Patient Name: Date of Service: [name] Ezzard Standing 7/15/2024andnbsp2:30 PM Medical Record Number: [id number] Patient Account Number:  [id number] Date of Birth/Gender: Treating RN: 22-Jun-1928 (89 y.o. Fredderick Phenix Primary Care Physician: [place], Betty Other Clinician: Referring Physician: Treating Physician/Extender: Hoffman, [name] [place], Betty Weeks in Treatment: 1 Education Assessment Education Provided To: Patient Education Topics Provided Wound/Skin Impairment: Methods: Explain/Verbal Responses: Reinforcements needed, State content correctly Electronic Signature(s) Signed: 08/06/2022 4:41:34 PM By: Samuella Bruin Entered By: Samuella Bruin on 08/06/2022 15:15:12 -------------------------------------------------------------------------------- Wound Assessment Details Patient Name: Date of Service: [name] [name] 08/06/2022 2:30 PM Medical Record Number: [id number] Patient Account Number: [id number] Date of Birth/Sex: Treating RN: 16-Sep-1928 (89 y.o. F) Primary Care Toluwanimi Radebaugh: [place], Betty Other Clinician: Referring Kilie Rund: Treating Rena Sweeden/Extender: Hoffman, [name] [place], Betty Lotito, Dominga T ([id number]) [id number] Page 7 of 8 Weeks in Treatment: 1 Wound Status Wound Number: 4 Primary Etiology: Trauma, Other Wound Location: Left, Anterior Lower Leg Wound Status: Open Wounding Event: Skin Tear/Laceration Comorbid History: Anemia, Hypertension, Osteoarthritis Date Acquired: 05/22/2022 Weeks Of Treatment: 1 Clustered Wound: No Photos Wound Measurements Length: (cm) 7.1 Width: (cm) 2.6 Depth: (cm) 0.2 Area: (cm) 14.498 Volume: (cm) 2.9 % Reduction in Area: 8.8% % Reduction in Volume: 8.8% Epithelialization: None Tunneling:  No Undermining: No Wound Description Classification: Full Thickness With Exposed Support Structures Exudate Amount: Medium Exudate Type: Serosanguineous Exudate Color: red, brown Foul Odor After Cleansing: No Slough/Fibrino Yes Wound Bed Granulation Amount: Small (1-33%) Exposed Structure Granulation Quality:  Red Fascia Exposed: No Necrotic Amount: Large (67-100%) Fat Layer (Subcutaneous Tissue) Exposed: Yes Necrotic Quality: Eschar, Adherent Slough Tendon Exposed: No Muscle Exposed: No Joint Exposed: No Bone Exposed: No Periwound Skin Texture Texture Color No Abnormalities Noted: No No Abnormalities Noted: No Moisture No Abnormalities Noted: No Treatment Notes Wound #4 (Lower Leg) Wound Laterality: Left, Anterior Cleanser Vashe 5.8 (oz) Discharge Instruction: Cleanse the wound with Vashe prior to applying a clean dressing using gauze sponges, not tissue or cotton balls. Peri-Wound Care Topical Primary Dressing Secondary Dressing Woven Gauze Sponge, Non-Sterile 4x4 in Discharge Instruction: Apply over primary dressing as directed. Secured With Paper Tape, 2x10 (in/yd) Discharge Instruction: Secure dressing with tape as directed. Lopata, Sherra T ([id number]) [id number] Page 8 of 8 Compression Wrap Kerlix Roll 4.5x3.1 (in/yd) Discharge Instruction: Apply Kerlix and Coban compression as directed. Compression Stockings Add-Ons Electronic Signature(s) Signed: 08/06/2022 4:10:29 PM By: Thayer Dallas Entered By: Thayer Dallas on 08/06/2022 14:48:50 -------------------------------------------------------------------------------- Vitals Details Patient Name: Date of Service: [name] [name] 08/06/2022 2:30 PM Medical Record Number: [id number] Patient Account Number: [id number] Date of Birth/Sex: Treating RN: 05/14/1928 (89 y.o. F) Primary Care Iesha Summerhill: [place], Betty Other Clinician: Referring Etheline Geppert: Treating Valary Manahan/Extender: Hoffman, [name] [place], Betty Weeks in Treatment: 1 Vital Signs Time Taken: 14:41 Temperature (F): 97.9 Height (in): 60 Pulse (bpm): 109 Weight (lbs): 97 Respiratory Rate (breaths/min): 18 Body Mass Index (BMI): 18.9 Blood Pressure (mmHg): 150/61 Reference Range: 80 - 120 mg / dl Electronic  Signature(s) Signed: 08/06/2022 4:10:29 PM By: Thayer Dallas Entered By: Thayer Dallas on 08/06/2022 14:42:58

## 2022-08-08 ENCOUNTER — Other Ambulatory Visit (HOSPITAL_COMMUNITY): Payer: Self-pay | Admitting: Internal Medicine

## 2022-08-08 ENCOUNTER — Ambulatory Visit (HOSPITAL_COMMUNITY)
Admission: RE | Admit: 2022-08-08 | Discharge: 2022-08-08 | Disposition: A | Discharge status: Home / Self Care | Payer: Medicare Other | Source: Ambulatory Visit | Attending: Vascular Surgery | Admitting: Vascular Surgery

## 2022-08-08 DIAGNOSIS — I87312 Chronic venous hypertension (idiopathic) with ulcer of left lower extremity: Secondary | ICD-10-CM

## 2022-08-08 DIAGNOSIS — I70201 Unspecified atherosclerosis of native arteries of extremities, right leg: Secondary | ICD-10-CM

## 2022-08-08 LAB — VAS US ABI WITH/WO TBI
Left ABI: 1.07
Right ABI: 0.84

## 2022-08-13 ENCOUNTER — Encounter (HOSPITAL_BASED_OUTPATIENT_CLINIC_OR_DEPARTMENT_OTHER): Payer: Medicare Other | Admitting: Internal Medicine

## 2022-08-13 DIAGNOSIS — I10 Essential (primary) hypertension: Secondary | ICD-10-CM | POA: Diagnosis not present

## 2022-08-13 DIAGNOSIS — L97822 Non-pressure chronic ulcer of other part of left lower leg with fat layer exposed: Secondary | ICD-10-CM | POA: Diagnosis not present

## 2022-08-13 DIAGNOSIS — M1990 Unspecified osteoarthritis, unspecified site: Secondary | ICD-10-CM | POA: Diagnosis not present

## 2022-08-13 DIAGNOSIS — T798XXA Other early complications of trauma, initial encounter: Secondary | ICD-10-CM

## 2022-08-13 DIAGNOSIS — M353 Polymyalgia rheumatica: Secondary | ICD-10-CM | POA: Diagnosis not present

## 2022-08-13 DIAGNOSIS — I87312 Chronic venous hypertension (idiopathic) with ulcer of left lower extremity: Secondary | ICD-10-CM

## 2022-08-13 DIAGNOSIS — I872 Venous insufficiency (chronic) (peripheral): Secondary | ICD-10-CM | POA: Diagnosis not present

## 2022-08-13 NOTE — Progress Notes (Signed)
Clarita, Mcelvain Romanda T ([id number]) [id number] Page 1 of 9 Visit Report for 08/13/2022 Chief Complaint Document Details Patient Name: Date of Service: [name] DARIYAH, GARDUNO T. 08/13/2022 10:30 A M Medical Record Number: [id number] Patient Account Number: [id number] Date of Birth/Sex: Treating RN: [birth year] (89 y.o. F) Primary Care Provider: [place], Betty Other Clinician: Referring Provider: Treating Provider/Extender: Pariss Hommes [place], Betty Weeks in Treatment: 2 Information Obtained from: Patient Chief Complaint 07/30/2022; left lower extremity wound secondary to trauma Electronic Signature(s) Signed: 08/13/2022 4:55:26 PM By: Geralyn Corwin DO Entered By: Geralyn Corwin on 08/13/2022 11:37:54 -------------------------------------------------------------------------------- HPI Details Patient Name: Date of Service: [name] [name] 08/13/2022 10:30 A M Medical Record Number: [id number] Patient Account Number: [id number] Date of Birth/Sex: Treating RN: [birth year] (89 y.o. F) Primary Care Provider: [place], Betty Other Clinician: Referring Provider: Treating Provider/Extender: Arch Methot [place], Betty Weeks in Treatment: 2 History of Present Illness HPI Description: ADMISSION 10/06/2018 This is an 89 year old woman who is here for wounds on her bilateral anterior lower legs. She was previously seen in early 2017 with a wound on her left leg that was traumatic. Felt to have lymphedema and venous insufficiency at that time although she had a reflux study that did not show significant superficial reflux in the left lower extremity. This healed. At some point she has had stockings prescribed but she cannot get the standard stocking on. The patient is also on chronic prednisone I think related to polymyalgia rheumatica currently at 2.5 mg daily. The patient states that in July she slipped out of bed traumatizing the anterior part of her leg. She  was seen by her primary care physicians beginning on 08/14/2018. It was noted to have some erythema and swelling she was given Keflex. On 7/30 she had doxycycline and I think he has had perhaps another round of doxycycline after this. Noted in their notes to have a history of chronic venous insufficiency and some edema. Sometime in August she traumatized her left anterior leg with the tip of her cane and has an open wound in this area as well. She has not been doing anything specific to the wounds themselves leaving them open to air and putting Band-Aids on top. Past medical history; hypertension, polymyalgia rheumatica on chronic prednisone, history of microdiscectomy, B12 deficiency, vitamin D deficiency, emphysema, osteoporosis ABIs in our clinic were 0.96 on the right and 0.99 on the left. 9/22; 2 small wounds on the left anterior and right anterior tibial area. These are better than last week. We put her in 3 layer compression unfortunately she appears to have a new wound on the medial right lower leg anteriorly probably because of wrap injury. She is on chronic prednisone with a history of chronic polymyalgia rheumatica and has very frail skin on her lower extremities 9/29; 2 small wounds on the left anterior and right anterior tibial areas these continue to improve. The abrasion she had from last week appears to have closed over. 10/6; the patient had 2 wounds on the left anterior and right anterior tibial wounds. The left anterior wound is just about closed. Right anterior tibial is still open. 10/13; the patient's leg is healed on the left as predicted. The area on the right is still open but measuring smaller 10/20; the patient comes in today with a small area on the left medial lower leg. The area on the right is still open but measuring smaller. We had put her in a dual layer stocking last week on the left she  is not able to get the 2 layers on she will only wear 1 layer. 10/27; the left  medial lower leg is closed. The right one is just about closed. We continue to dress the right leg and compression we put her 11/5; the left medial leg is closed and she has a juxta lite stocking on although her daughter had to apply it. The right one is closed today and we will put a juxta lite stocking on this leg as well. The patient states most of the skin changes are due to chronic steroid use. T the point this is true but she has tightly fibrotic o Anzaldo, Liliya T ([id number]) [phone].pdf Page 2 of 9 skin in her distal lower extremities suggesting some degree of chronic venous insufficiency as well. She does not have a lot of edema in her leg 07/30/2022 Ms. Shandria Clinch is a 89 year old female with a past medical history of essential hypertension, polymyalgia rheumatica on chronic steroids, and venous insufficiency that presents to the clinic for a 9-week history of nonhealing wound to the left lower extremities. She states she hit her leg against a table and developed a hematoma that eventually opened and drained. She has been using hydrogen peroxide to the wound bed. She currently denies signs of infection. She has not been on antibiotics. She has compression stockings at home but has difficulty putting these on. She states she cannot even put on a sock. 7/15; patient presents for follow-up. She has been using Vashe wet-to-dry dressings although technique is questionable. She has left this open to air for several days because she did not think she had the right gauze to use for the wet-to-dry dressing. 7/22; patient presents for follow-up. She has been using Vashe wet-to-dry dressings. More healthy granulation tissue present today. She had ABIs completed that showed an ABI of 1.07 on the left with biphasic waveforms throughout the system. Electronic Signature(s) Signed: 08/13/2022 4:55:26 PM By: Geralyn Corwin DO Entered By: Geralyn Corwin on 08/13/2022  11:38:50 -------------------------------------------------------------------------------- Physical Exam Details Patient Name: Date of Service: [name] [name] 08/13/2022 10:30 A M Medical Record Number: [id number] Patient Account Number: [id number] Date of Birth/Sex: Treating RN: [birth year] (89 y.o. F) Primary Care Provider: [place], Betty Other Clinician: Referring Provider: Treating Provider/Extender: Kayren Holck [place], Betty Weeks in Treatment: 2 Constitutional respirations regular, non-labored and within target range for patient.. Cardiovascular 2+ dorsalis pedis/posterior tibialis pulses. Psychiatric pleasant and cooperative. Notes Left lower extremity: T the anterior aspect there is an open wound with granulation tissue and nonviable tissue. No surrounding signs of infection. Venous o stasis dermatitis noted throughout the leg. Electronic Signature(s) Signed: 08/13/2022 4:55:26 PM By: Geralyn Corwin DO Entered By: Geralyn Corwin on 08/13/2022 11:39:34 -------------------------------------------------------------------------------- Physician Orders Details Patient Name: Date of Service: [name] [name] 08/13/2022 10:30 A M Medical Record Number: [id number] Patient Account Number: [id number] Date of Birth/Sex: Treating RN: 12/02/1928 (89 y.o. Arta Silence Primary Care Provider: [place], Betty Other Clinician: Referring Provider: Treating Provider/Extender: Govani Radloff [place], Betty Weeks in Treatment: 2 Verbal / Phone Orders: No Diagnosis Coding ICD-10 Coding Code Description T79.8XXA Other early complications of trauma, initial encounter Azure, Arrow T ([id number]) [phone].pdf Page 3 of 9 [id number] Non-pressure chronic ulcer of other part of left lower leg with fat layer exposed I87.312 Chronic venous hypertension (idiopathic) with ulcer of left lower extremity M35.3 Polymyalgia rheumatica Z92.241 Personal  history of systemic steroid therapy Follow-up Appointments ppointment in 1 week. - Dr. Mikey Bussing - room 9  08/20/2022 3pm Monday Return A ppointment in 2 weeks. - Dr. Mikey Bussing room 9 2pm 08/27/2022 Monday Return A Nurse Visit: - Thursday 345pm 08/16/2022 Anesthetic (In clinic) Topical Lidocaine 4% applied to wound bed Bathing/ Shower/ Hygiene May shower and wash wound with soap and water. Edema Control - Lymphedema / SCD / Other Bilateral Lower Extremities Elevate legs to the level of the heart or above for 30 minutes daily and/or when sitting for 3-4 times a day throughout the day. Avoid standing for long periods of time. Wound Treatment Wound #4 - Lower Leg Wound Laterality: Left, Anterior Cleanser: Soap and Water 1 x Per Week/30 Days Discharge Instructions: May shower and wash wound with dial antibacterial soap and water prior to dressing change. Cleanser: Vashe 5.8 (oz) 1 x Per Week/30 Days Discharge Instructions: Cleanse the wound with Vashe prior to applying a clean dressing using gauze sponges, not tissue or cotton balls. Peri-Wound Care: Sween Lotion (Moisturizing lotion) 1 x Per Week/30 Days Discharge Instructions: Apply moisturizing lotion as directed Topical: Gentamicin 1 x Per Week/30 Days Discharge Instructions: As directed by physician Topical: Mupirocin Ointment 1 x Per Week/30 Days Discharge Instructions: Apply Mupirocin (Bactroban) as instructed Topical: Santyl 1 x Per Week/30 Days Discharge Instructions: apply directly wound bed. Prim Dressing: Hydrofera Blue Ready Transfer Foam, 4x5 (in/in) 1 x Per Week/30 Days ary Discharge Instructions: Apply to wound bed as instructed Secondary Dressing: ABD Pad, 8x10 1 x Per Week/30 Days Discharge Instructions: Apply over primary dressing as directed. Secondary Dressing: Woven Gauze Sponge, Non-Sterile 4x4 in 1 x Per Week/30 Days Discharge Instructions: Apply over primary dressing as directed. Compression Wrap: Kerlix Roll 4.5x3.1  (in/yd) (Generic) 1 x Per Week/30 Days Discharge Instructions: Apply Kerlix and Coban compression as directed. Compression Wrap: Coban Self-Adherent Wrap 4x5 (in/yd) 1 x Per Week/30 Days Discharge Instructions: Apply over Kerlix as directed. Patient Medications llergies: aspirin, codeine, Bactrim A Notifications Medication Indication Start End applied prior to 08/13/2022 lidocaine debridement DOSE topical 4 % cream - cream topical once daily Electronic Signature(s) Signed: 08/13/2022 4:55:26 PM By: Geralyn Corwin DO Entered By: Geralyn Corwin on 08/13/2022 11:39:41 Postlethwait, Elvin T ([id number]) [id number] Page 4 of 9 -------------------------------------------------------------------------------- Problem List Details Patient Name: Date of Service: [name] ANNABETH, TORTORA T. 08/13/2022 10:30 A M Medical Record Number: [id number] Patient Account Number: [id number] Date of Birth/Sex: Treating RN: [birth year] (89 y.o. Arta Silence Primary Care Provider: [place], Betty Other Clinician: Referring Provider: Treating Provider/Extender: Loudon Krakow [place], Betty Weeks in Treatment: 2 Active Problems ICD-10 Encounter Code Description Active Date MDM Diagnosis T79.8XXA Other early complications of trauma, initial encounter 07/30/2022 No Yes L97.822 Non-pressure chronic ulcer of other part of left lower leg with fat layer exposed7/08/2022 No Yes I87.312 Chronic venous hypertension (idiopathic) with ulcer of left lower extremity 07/30/2022 No Yes M35.3 Polymyalgia rheumatica 07/30/2022 No Yes Z92.241 Personal history of systemic steroid therapy 07/30/2022 No Yes Inactive Problems Resolved Problems Electronic Signature(s) Signed: 08/13/2022 4:55:26 PM By: Geralyn Corwin DO Entered By: Geralyn Corwin on 08/13/2022 11:37:43 -------------------------------------------------------------------------------- Progress Note Details Patient Name: Date of  Service: [name] [name] 08/13/2022 10:30 A M Medical Record Number: [id number] Patient Account Number: [id number] Date of Birth/Sex: Treating RN: [birth year] (89 y.o. F) Primary Care Provider: [place], Betty Other Clinician: Referring Provider: Treating Provider/Extender: Jesselee Poth [place], Betty Weeks in Treatment: 2 Subjective Chief Complaint Information obtained from Patient 07/30/2022; left lower extremity wound secondary to trauma History of Present Illness (HPI) ADMISSION Busbee, Brogan T ([id number]) [phone].pdf Page  5 of 9 10/06/2018 This is an 89 year old woman who is here for wounds on her bilateral anterior lower legs. She was previously seen in early 2017 with a wound on her left leg that was traumatic. Felt to have lymphedema and venous insufficiency at that time although she had a reflux study that did not show significant superficial reflux in the left lower extremity. This healed. At some point she has had stockings prescribed but she cannot get the standard stocking on. The patient is also on chronic prednisone I think related to polymyalgia rheumatica currently at 2.5 mg daily. The patient states that in July she slipped out of bed traumatizing the anterior part of her leg. She was seen by her primary care physicians beginning on 08/14/2018. It was noted to have some erythema and swelling she was given Keflex. On 7/30 she had doxycycline and I think he has had perhaps another round of doxycycline after this. Noted in their notes to have a history of chronic venous insufficiency and some edema. Sometime in August she traumatized her left anterior leg with the tip of her cane and has an open wound in this area as well. She has not been doing anything specific to the wounds themselves leaving them open to air and putting Band-Aids on top. Past medical history; hypertension, polymyalgia rheumatica on chronic prednisone, history of  microdiscectomy, B12 deficiency, vitamin D deficiency, emphysema, osteoporosis ABIs in our clinic were 0.96 on the right and 0.99 on the left. 9/22; 2 small wounds on the left anterior and right anterior tibial area. These are better than last week. We put her in 3 layer compression unfortunately she appears to have a new wound on the medial right lower leg anteriorly probably because of wrap injury. She is on chronic prednisone with a history of chronic polymyalgia rheumatica and has very frail skin on her lower extremities 9/29; 2 small wounds on the left anterior and right anterior tibial areas these continue to improve. The abrasion she had from last week appears to have closed over. 10/6; the patient had 2 wounds on the left anterior and right anterior tibial wounds. The left anterior wound is just about closed. Right anterior tibial is still open. 10/13; the patient's leg is healed on the left as predicted. The area on the right is still open but measuring smaller 10/20; the patient comes in today with a small area on the left medial lower leg. The area on the right is still open but measuring smaller. We had put her in a dual layer stocking last week on the left she is not able to get the 2 layers on she will only wear 1 layer. 10/27; the left medial lower leg is closed. The right one is just about closed. We continue to dress the right leg and compression we put her 11/5; the left medial leg is closed and she has a juxta lite stocking on although her daughter had to apply it. The right one is closed today and we will put a juxta lite stocking on this leg as well. The patient states most of the skin changes are due to chronic steroid use. T the point this is true but she has tightly fibrotic o skin in her distal lower extremities suggesting some degree of chronic venous insufficiency as well. She does not have a lot of edema in her leg 07/30/2022 Ms. Sybel Standish is a 89 year old female  with a past medical history of essential hypertension, polymyalgia rheumatica on  chronic steroids, and venous insufficiency that presents to the clinic for a 9-week history of nonhealing wound to the left lower extremities. She states she hit her leg against a table and developed a hematoma that eventually opened and drained. She has been using hydrogen peroxide to the wound bed. She currently denies signs of infection. She has not been on antibiotics. She has compression stockings at home but has difficulty putting these on. She states she cannot even put on a sock. 7/15; patient presents for follow-up. She has been using Vashe wet-to-dry dressings although technique is questionable. She has left this open to air for several days because she did not think she had the right gauze to use for the wet-to-dry dressing. 7/22; patient presents for follow-up. She has been using Vashe wet-to-dry dressings. More healthy granulation tissue present today. She had ABIs completed that showed an ABI of 1.07 on the left with biphasic waveforms throughout the system. Patient History Information obtained from Patient. Family History Cancer - Father, Heart Disease - Father, Lung Disease - Father, No family history of Diabetes, Hereditary Spherocytosis, Hypertension, Seizures, Stroke, Thyroid Problems, Tuberculosis. Social History Former smoker, Marital Status - Widowed, Alcohol Use - Moderate, Drug Use - No History, Caffeine Use - Moderate. Medical History Eyes Denies history of Cataracts, Glaucoma, Optic Neuritis Ear/Nose/Mouth/Throat Denies history of Chronic sinus problems/congestion, Middle ear problems Hematologic/Lymphatic Patient has history of Anemia Denies history of Hemophilia, Human Immunodeficiency Virus, Lymphedema, Sickle Cell Disease Respiratory Denies history of Aspiration, Asthma, Chronic Obstructive Pulmonary Disease (COPD), Pneumothorax, Sleep Apnea, Tuberculosis Cardiovascular Patient  has history of Hypertension Denies history of Angina, Arrhythmia, Congestive Heart Failure, Coronary Artery Disease, Deep Vein Thrombosis, Hypotension, Myocardial Infarction, Peripheral Arterial Disease, Peripheral Venous Disease, Phlebitis, Vasculitis Gastrointestinal Denies history of Cirrhosis , Colitis, Crohns, Hepatitis A, Hepatitis B, Hepatitis C Endocrine Denies history of Type I Diabetes, Type II Diabetes Genitourinary Denies history of End Stage Renal Disease Immunological Denies history of Lupus Erythematosus, Raynauds, Scleroderma Integumentary (Skin) Denies history of History of Burn Musculoskeletal Patient has history of Osteoarthritis Denies history of Gout, Rheumatoid Arthritis, Osteomyelitis Neurologic Denies history of Dementia, Neuropathy, Quadriplegia, Paraplegia, Seizure Disorder Oncologic Denies history of Received Chemotherapy, Received Radiation Psychiatric Denies history of Anorexia/bulimia, Confinement Anxiety Blattner, Yuleidy T ([id number]) [id number] Page 6 of 9 Objective Constitutional respirations regular, non-labored and within target range for patient.. Vitals Time Taken: 10:49 AM, Height: 60 in, Weight: 97 lbs, BMI: 18.9, Temperature: 97.7 F, Pulse: 120 bpm, Respiratory Rate: 18 breaths/min, Blood Pressure: 170/77 mmHg. Cardiovascular 2+ dorsalis pedis/posterior tibialis pulses. Psychiatric pleasant and cooperative. General Notes: Left lower extremity: T the anterior aspect there is an open wound with granulation tissue and nonviable tissue. No surrounding signs of o infection. Venous stasis dermatitis noted throughout the leg. Integumentary (Hair, Skin) Wound #4 status is Open. Original cause of wound was Skin T ear/Laceration. The date acquired was: 05/22/2022. The wound has been in treatment 2 weeks. The wound is located on the Left,Anterior Lower Leg. The wound measures 7.3cm length x 2.3cm width x 0.2cm depth;  13.187cm^2 area and 2.637cm^3 volume. There is Fat Layer (Subcutaneous Tissue) exposed. There is no tunneling or undermining noted. There is a medium amount of serosanguineous drainage noted. The wound margin is distinct with the outline attached to the wound base. There is medium (34-66%) red granulation within the wound bed. There is a medium (34-66%) amount of necrotic tissue within the wound bed including Adherent Slough. The periwound skin appearance did not exhibit: Callus,  Crepitus, Excoriation, Induration, Rash, Scarring, Dry/Scaly, Maceration, Atrophie Blanche, Cyanosis, Ecchymosis, Hemosiderin Staining, Mottled, Pallor, Rubor, Erythema. Assessment Active Problems ICD-10 Other early complications of trauma, initial encounter Non-pressure chronic ulcer of other part of left lower leg with fat layer exposed Chronic venous hypertension (idiopathic) with ulcer of left lower extremity Polymyalgia rheumatica Personal history of systemic steroid therapy Patient's wound has healthier granulation tissue present today. No signs of infection. Still has nonviable tissue but this is tightly adhered. I recommend at this time Santyl, Hydrofera Blue and antibiotic ointment under compression therapy. ABIs are normal. She has strong pedal pulses. She has adequate blood flow for healing. We will start with a light wrap with Kerlix/Coban and bring her back for nurse visit to assure that everything went well. Follow-up with me in 1 week. Plan Follow-up Appointments: Return Appointment in 1 week. - Dr. Mikey Bussing - room 9 08/20/2022 3pm Monday Return Appointment in 2 weeks. - Dr. Mikey Bussing room 9 2pm 08/27/2022 Monday Nurse Visit: - Thursday 345pm 08/16/2022 Anesthetic: (In clinic) Topical Lidocaine 4% applied to wound bed Bathing/ Shower/ Hygiene: May shower and wash wound with soap and water. Edema Control - Lymphedema / SCD / Other: Elevate legs to the level of the heart or above for 30 minutes daily and/or  when sitting for 3-4 times a day throughout the day. Avoid standing for long periods of time. The following medication(s) was prescribed: lidocaine topical 4 % cream cream topical once daily for applied prior to debridement was prescribed at facility WOUND #4: - Lower Leg Wound Laterality: Left, Anterior Cleanser: Soap and Water 1 x Per Week/30 Days Discharge Instructions: May shower and wash wound with dial antibacterial soap and water prior to dressing change. Cleanser: Vashe 5.8 (oz) 1 x Per Week/30 Days Discharge Instructions: Cleanse the wound with Vashe prior to applying a clean dressing using gauze sponges, not tissue or cotton balls. Peri-Wound Care: Sween Lotion (Moisturizing lotion) 1 x Per Week/30 Days Discharge Instructions: Apply moisturizing lotion as directed Topical: Gentamicin 1 x Per Week/30 Days Discharge Instructions: As directed by physician Topical: Mupirocin Ointment 1 x Per Week/30 Days Discharge Instructions: Apply Mupirocin (Bactroban) as instructed Topical: Santyl 1 x Per Week/30 Days Burck, Aracelly T ([id number]) [phone].pdf Page 7 of 9 Discharge Instructions: apply directly wound bed. Prim Dressing: Hydrofera Blue Ready Transfer Foam, 4x5 (in/in) 1 x Per Week/30 Days ary Discharge Instructions: Apply to wound bed as instructed Secondary Dressing: ABD Pad, 8x10 1 x Per Week/30 Days Discharge Instructions: Apply over primary dressing as directed. Secondary Dressing: Woven Gauze Sponge, Non-Sterile 4x4 in 1 x Per Week/30 Days Discharge Instructions: Apply over primary dressing as directed. Com pression Wrap: Kerlix Roll 4.5x3.1 (in/yd) (Generic) 1 x Per Week/30 Days Discharge Instructions: Apply Kerlix and Coban compression as directed. Com pression Wrap: Coban Self-Adherent Wrap 4x5 (in/yd) 1 x Per Week/30 Days Discharge Instructions: Apply over Kerlix as directed. 1. Santyl and Hydrofera Blue with antibiotic ointment under  kerlix/coban - left lower extremity 2. Follow up in one week, nurse visit later this week Electronic Signature(s) Signed: 08/13/2022 4:55:26 PM By: Geralyn Corwin DO Entered By: Geralyn Corwin on 08/13/2022 11:45:19 -------------------------------------------------------------------------------- HxROS Details Patient Name: Date of Service: [name] [name] 08/13/2022 10:30 A M Medical Record Number: [id number] Patient Account Number: [id number] Date of Birth/Sex: Treating RN: October 23, 1928 (89 y.o. F) Primary Care Provider: [place], Betty Other Clinician: Referring Provider: Treating Provider/Extender: Lennon Richins [place], Betty Weeks in Treatment: 2 Information Obtained From Patient Eyes Medical History:  Negative for: Cataracts; Glaucoma; Optic Neuritis Ear/Nose/Mouth/Throat Medical History: Negative for: Chronic sinus problems/congestion; Middle ear problems Hematologic/Lymphatic Medical History: Positive for: Anemia Negative for: Hemophilia; Human Immunodeficiency Virus; Lymphedema; Sickle Cell Disease Respiratory Medical History: Negative for: Aspiration; Asthma; Chronic Obstructive Pulmonary Disease (COPD); Pneumothorax; Sleep Apnea; Tuberculosis Cardiovascular Medical History: Positive for: Hypertension Negative for: Angina; Arrhythmia; Congestive Heart Failure; Coronary Artery Disease; Deep Vein Thrombosis; Hypotension; Myocardial Infarction; Peripheral Arterial Disease; Peripheral Venous Disease; Phlebitis; Vasculitis Gastrointestinal Medical History: Negative for: Cirrhosis ; Colitis; Crohns; Hepatitis A; Hepatitis B; Hepatitis C Endocrine Medical HistoryTRANAE, LARAMIE T ([id number]) [id number] Page 8 of 9 Negative for: Type I Diabetes; Type II Diabetes Genitourinary Medical History: Negative for: End Stage Renal Disease Immunological Medical History: Negative for: Lupus Erythematosus; Raynauds;  Scleroderma Integumentary (Skin) Medical History: Negative for: History of Burn Musculoskeletal Medical History: Positive for: Osteoarthritis Negative for: Gout; Rheumatoid Arthritis; Osteomyelitis Neurologic Medical History: Negative for: Dementia; Neuropathy; Quadriplegia; Paraplegia; Seizure Disorder Oncologic Medical History: Negative for: Received Chemotherapy; Received Radiation Psychiatric Medical History: Negative for: Anorexia/bulimia; Confinement Anxiety Immunizations Pneumococcal Vaccine: Received Pneumococcal Vaccination: No Implantable Devices None Family and Social History Cancer: Yes - Father; Diabetes: No; Heart Disease: Yes - Father; Hereditary Spherocytosis: No; Hypertension: No; Lung Disease: Yes - Father; Seizures: No; Stroke: No; Thyroid Problems: No; Tuberculosis: No; Former smoker; Marital Status - Widowed; Alcohol Use: Moderate; Drug Use: No History; Caffeine Use: Moderate; Financial Concerns: No; Food, Clothing or Shelter Needs: No; Support System Lacking: No; Transportation Concerns: No Electronic Signature(s) Signed: 08/13/2022 4:55:26 PM By: Geralyn Corwin DO Entered By: Geralyn Corwin on 08/13/2022 11:38:57 -------------------------------------------------------------------------------- SuperBill Details Patient Name: Date of Service: [name] [name] 08/13/2022 Medical Record Number: [id number] Patient Account Number: [id number] Date of Birth/Sex: Treating RN: Aug 14, 1928 (89 y.o. F) Primary Care Provider: [place], Betty Other Clinician: Referring Provider: Treating Provider/Extender: Sarra Rachels [place], Betty Weeks in Treatment: 2 Diagnosis Coding ICD-10 Codes GERARD, BONUS T ([id number]) [id number] Page 9 of 9 Code Description T79.8XXA Other early complications of trauma, initial encounter L97.822 Non-pressure chronic ulcer of other part of left lower leg with fat layer exposed I87.312 Chronic  venous hypertension (idiopathic) with ulcer of left lower extremity M35.3 Polymyalgia rheumatica Z92.241 Personal history of systemic steroid therapy Physician Procedures : CPT4 Code Description Modifier [id number] - WC PHYS LEVEL 3 - EST PT ICD-10 Diagnosis Description L97.822 Non-pressure chronic ulcer of other part of left lower leg with fat layer exposed I87.312 Chronic venous hypertension (idiopathic) with ulcer  of left lower extremity T79.8XXA Other early complications of trauma, initial encounter M35.3 Polymyalgia rheumatica Quantity: 1 Electronic Signature(s) Signed: 08/13/2022 4:55:26 PM By: Geralyn Corwin DO Entered By: Geralyn Corwin on 08/13/2022 11:46:04

## 2022-08-15 ENCOUNTER — Ambulatory Visit (HOSPITAL_BASED_OUTPATIENT_CLINIC_OR_DEPARTMENT_OTHER): Payer: Medicare Other | Admitting: General Surgery

## 2022-08-15 NOTE — Progress Notes (Addendum)
Defeo, [name] ([id number]) [id number] Page 1 of 8 Visit Report for 08/13/2022 Arrival Information Details Patient Name: Date of Service: [name], [name]. 08/13/2022 10:30 A M Medical Record Number: [id number] Patient Account Number: [id number] Date of Birth/Sex: Treating RN: 09-Jun-1928 (89 y.o. F) Primary Care Marlon Suleiman: [name], [name] Other Clinician: Referring Coby Shrewsberry: Treating Buel Molder/Extender: Hoffman, [name], [name] Weeks in Treatment: 2 Visit Information History Since Last Visit Added or deleted any medications: No Patient Arrived: Ambulatory Any new allergies or adverse reactions: No Arrival Time: 10:49 Had a fall or experienced change in No Accompanied By: Daughter activities of daily living that may affect Transfer Assistance: None risk of falls: Patient Identification Verified: Yes Signs or symptoms of abuse/neglect since last visito No Secondary Verification Process Completed: Yes Hospitalized since last visit: No Implantable device outside of the clinic excluding No cellular tissue based products placed in the center since last visit: Has Dressing in Place as Prescribed: Yes Pain Present Now: No Electronic Signature(s) Signed: 08/15/2022 11:54:52 AM By: Thayer Dallas Entered By: Thayer Dallas on 08/13/2022 07:49:42 -------------------------------------------------------------------------------- Clinic Level of Care Assessment Details Patient Name: Date of Service: [name], [name]. 08/13/2022 10:30 A M Medical Record Number: [id number] Patient Account Number: [id number] Date of Birth/Sex: Treating RN: Jul 03, 1928 (89 y.o. F) Primary Care Mersadie Kavanaugh: [name], [name] Other Clinician: Referring Damyn Weitzel: Treating Airel Magadan/Extender: Hoffman, [name], [name] Weeks in Treatment: 2 Clinic Level of Care Assessment Items TOOL 4 Quantity Score []  - 0 Use when only an EandM is performed on FOLLOW-UP visit ASSESSMENTS  - Nursing Assessment / Reassessment X- 1 10 Reassessment of Co-morbidities (includes updates in patient status) X- 1 5 Reassessment of Adherence to Treatment Plan ASSESSMENTS - Wound and Skin A ssessment / Reassessment X - Simple Wound Assessment / Reassessment - one wound 1 5 []  - 0 Complex Wound Assessment / Reassessment - multiple wounds []  - 0 Dermatologic / Skin Assessment (not related to wound area) ASSESSMENTS - Focused Assessment X- 1 5 Circumferential Edema Measurements - multi extremities []  - 0 Nutritional Assessment / Counseling / Intervention [name], [name] ([id number]) [phone].pdf Page 2 of 8 []  - 0 Lower Extremity Assessment (monofilament, tuning fork, pulses) []  - 0 Peripheral Arterial Disease Assessment (using hand held doppler) ASSESSMENTS - Ostomy and/or Continence Assessment and Care []  - 0 Incontinence Assessment and Management []  - 0 Ostomy Care Assessment and Management (repouching, etc.) PROCESS - Coordination of Care X - Simple Patient / Family Education for ongoing care 1 15 []  - 0 Complex (extensive) Patient / Family Education for ongoing care X- 1 10 Staff obtains Chiropractor, Records, [name] Results / Process Orders est []  - 0 Staff telephones HHA, Nursing Homes / Clarify orders / etc []  - 0 Routine Transfer to another Facility (non-emergent condition) []  - 0 Routine Hospital Admission (non-emergent condition) []  - 0 New Admissions / Manufacturing engineer / Ordering NPWT Apligraf, etc. , []  - 0 Emergency Hospital Admission (emergent condition) []  - 0 Simple Discharge Coordination []  - 0 Complex (extensive) Discharge Coordination PROCESS - Special Needs []  - 0 Pediatric / Minor Patient Management []  - 0 Isolation Patient Management []  - 0 Hearing / Language / Visual special needs []  - 0 Assessment of Community assistance (transportation, D/C planning, etc.) []  - 0 Additional assistance / Altered  mentation []  - 0 Support Surface(s) Assessment (bed, cushion, seat, etc.) INTERVENTIONS - Wound Cleansing / Measurement X - Simple Wound Cleansing - one wound 1 5 []  - 0 Complex Wound Cleansing -  multiple wounds X- 1 5 Wound Imaging (photographs - any number of wounds) []  - 0 Wound Tracing (instead of photographs) X- 1 5 Simple Wound Measurement - one wound []  - 0 Complex Wound Measurement - multiple wounds INTERVENTIONS - Wound Dressings X - Small Wound Dressing one or multiple wounds 1 10 []  - 0 Medium Wound Dressing one or multiple wounds []  - 0 Large Wound Dressing one or multiple wounds []  - 0 Application of Medications - topical []  - 0 Application of Medications - injection INTERVENTIONS - Miscellaneous []  - 0 External ear exam []  - 0 Specimen Collection (cultures, biopsies, blood, body fluids, etc.) []  - 0 Specimen(s) / Culture(s) sent or taken to Lab for analysis []  - 0 Patient Transfer (multiple staff / Nurse, adult / Similar devices) []  - 0 Simple Staple / Suture removal (25 or less) []  - 0 Complex Staple / Suture removal (26 or more) []  - 0 Hypo / Hyperglycemic Management (close monitor of Blood Glucose) Varnum, [name] ([id number]) [id number] Page 3 of 8 []  - 0 Ankle / Brachial Index (ABI) - do not check if billed separately X- 1 5 Vital Signs Has the patient been seen at the hospital within the last three years: Yes Total Score: 80 Level Of Care: New/Established - Level 3 Electronic Signature(s) Signed: 09/26/2022 9:24:15 AM By: Pearletha Alfred Entered By: Pearletha Alfred on 08/16/2022 11:49:07 -------------------------------------------------------------------------------- Lower Extremity Assessment Details Patient Name: Date of Service: [name], [name]. 08/13/2022 10:30 A M Medical Record Number: [id number] Patient Account Number: [id number] Date of Birth/Sex: Treating RN: [birth year] (89 y.o. F) Primary Care Kassandra Meriweather:  [name], [name] Other Clinician: Referring Micaiah Remillard: Treating Zuleima Haser/Extender: Hoffman, [name], [name] Weeks in Treatment: 2 Edema Assessment Assessed: [Left: No] [Right: No] [Left: Edema] [Right: :] Calf Left: Right: Point of Measurement: 27 cm From Medial Instep 27.2 cm Ankle Left: Right: Point of Measurement: 10 cm From Medial Instep 19 cm Electronic Signature(s) Signed: 08/15/2022 11:54:52 AM By: Thayer Dallas Entered By: Thayer Dallas on 08/13/2022 07:51:57 -------------------------------------------------------------------------------- Multi Wound Chart Details Patient Name: Date of Service: [name]. 08/13/2022 10:30 A M Medical Record Number: [id number] Patient Account Number: [id number] Date of Birth/Sex: Treating RN: [birth year] (89 y.o. F) Primary Care Ardice Boyan: [name], [name] Other Clinician: Referring Nakaiya Beddow: Treating Tania Perrott/Extender: Hoffman, [name], [name] Weeks in Treatment: 2 Vital Signs Height(in): 60 Pulse(bpm): 120 Weight(lbs): 97 Blood Pressure(mmHg): 170/77 Body Mass Index(BMI): 18.9 Temperature(F): 97.7 Respiratory Rate(breaths/min): 18 [4:Photos:] [N/A:N/A] Left, Anterior Lower Leg N/A N/A Wound Location: Skin [name] ear/Laceration N/A N/A Wounding Event: Trauma, Other N/A N/A Primary Etiology: Anemia, Hypertension, Osteoarthritis N/A N/A Comorbid History: 05/22/2022 N/A N/A Date Acquired: 2 N/A N/A Weeks of Treatment: Open N/A N/A Wound Status: No N/A N/A Wound Recurrence: 7.3x2.3x0.2 N/A N/A Measurements L x W x D (cm) 13.187 N/A N/A A (cm) : rea 2.637 N/A N/A Volume (cm) : 17.10% N/A N/A % Reduction in A rea: 17.10% N/A N/A % Reduction in Volume: Full Thickness With Exposed Support N/A N/A Classification: Structures Medium N/A N/A Exudate Amount: Serosanguineous N/A N/A Exudate Type: red, brown N/A N/A Exudate Color: Distinct, outline attached N/A N/A Wound Margin: Medium (34-66%) N/A  N/A Granulation Amount: Red N/A N/A Granulation Quality: Medium (34-66%) N/A N/A Necrotic Amount: Fat Layer (Subcutaneous Tissue): Yes N/A N/A Exposed Structures: Fascia: No Tendon: No Muscle: No Joint: No Bone: No None N/A N/A Epithelialization: Excoriation: No N/A N/A Periwound Skin Texture: Induration: No Callus: No Crepitus: No Rash: No  Scarring: No Maceration: No N/A N/A Periwound Skin Moisture: Dry/Scaly: No Atrophie Blanche: No N/A N/A Periwound Skin Color: Cyanosis: No Ecchymosis: No Erythema: No Hemosiderin Staining: No Mottled: No Pallor: No Rubor: No Treatment Notes Electronic Signature(s) Signed: 08/13/2022 4:55:26 PM By: Geralyn Corwin DO Entered By: Geralyn Corwin on 08/13/2022 08:37:47 -------------------------------------------------------------------------------- Multi-Disciplinary Care Plan Details Patient Name: Date of Service: [name]. 08/13/2022 10:30 A M Medical Record Number: [id number] Patient Account Number: [id number] Date of Birth/Sex: Treating RN: 12-21-1928 (89 y.o. Arta Silence Primary Care Maryfer Tauzin: [name], [name] Other Clinician: Referring Maclean Foister: Treating Cesario Weidinger/Extender: Hoffman, [name], [name] Weeks in Treatment: 2 Active Inactive Harper, Kitana [name] ([id number]) [id number] Page 5 of 8 Wound/Skin Impairment Nursing Diagnoses: Impaired tissue integrity Goals: Patient/caregiver will verbalize understanding of skin care regimen Date Initiated: 07/30/2022 Target Resolution Date: 09/22/2022 Goal Status: Active Interventions: Assess patient/caregiver ability to obtain necessary supplies Assess patient/caregiver ability to perform ulcer/skin care regimen upon admission and as needed Assess ulceration(s) every visit Provide education on ulcer and skin care Screen for HBO Treatment Activities: Skin care regimen initiated : 07/30/2022 Topical wound management initiated :  07/30/2022 Notes: Electronic Signature(s) Signed: 08/14/2022 4:56:09 PM By: Shawn Stall RN, [name] Entered By: Shawn Stall on 08/13/2022 07:57:07 -------------------------------------------------------------------------------- Pain Assessment Details Patient Name: Date of Service: [name]. 08/13/2022 10:30 A M Medical Record Number: [id number] Patient Account Number: [id number] Date of Birth/Sex: Treating RN: 07/04/1928 (89 y.o. F) Primary Care Elimelech Houseman: [name], [name] Other Clinician: Referring Travone Georg: Treating Karenann Mcgrory/Extender: Hoffman, [name], [name] Weeks in Treatment: 2 Active Problems Location of Pain Severity and Description of Pain Patient Has Paino Yes Site Locations Pain Location: Pain in Ulcers Rate the pain. Current Pain Level: 2 Character of Pain Describe the Pain: Throbbing Pain Management and Medication Current Pain Management: SAQQARA, NEMET [name] ([id number]) [phone].pdf Page 6 of 8 Electronic Signature(s) Signed: 08/15/2022 11:54:52 AM By: Thayer Dallas Entered By: Thayer Dallas on 08/13/2022 07:51:40 -------------------------------------------------------------------------------- Patient/Caregiver Education Details Patient Name: Date of Service: [name] 7/22/2024andnbsp10:30 A M Medical Record Number: [id number] Patient Account Number: [id number] Date of Birth/Gender: Treating RN: 08-27-1928 (89 y.o. Arta Silence Primary Care Physician: [name], [name] Other Clinician: Referring Physician: Treating Physician/Extender: Hoffman, [name], [name] Weeks in Treatment: 2 Education Assessment Education Provided To: Patient Education Topics Provided Wound/Skin Impairment: Handouts: Caring for Your Ulcer Methods: Explain/Verbal Responses: Reinforcements needed Electronic Signature(s) Signed: 08/14/2022 4:56:09 PM By: Shawn Stall RN, [name] Entered By: Shawn Stall on 08/13/2022  07:58:13 -------------------------------------------------------------------------------- Wound Assessment Details Patient Name: Date of Service: [name]. 08/13/2022 10:30 A M Medical Record Number: [id number] Patient Account Number: [id number] Date of Birth/Sex: Treating RN: 1928/10/29 (89 y.o. F) Primary Care Deray Dawes: [name], [name] Other Clinician: Referring Riyad Keena: Treating Stoy Fenn/Extender: Hoffman, [name], [name] Weeks in Treatment: 2 Wound Status Wound Number: 4 Primary Etiology: Trauma, Other Wound Location: Left, Anterior Lower Leg Wound Status: Open Wounding Event: Skin Tear/Laceration Comorbid History: Anemia, Hypertension, Osteoarthritis Date Acquired: 05/22/2022 Weeks Of Treatment: 2 Clustered Wound: No Photos Canniff, [name] [name] ([id number]) [phone].pdf Page 7 of 8 Wound Measurements Length: (cm) 7.3 Width: (cm) 2.3 Depth: (cm) 0.2 Area: (cm) 13.187 Volume: (cm) 2.637 % Reduction in Area: 17.1% % Reduction in Volume: 17.1% Epithelialization: None Tunneling: No Undermining: No Wound Description Classification: Full Thickness With Exposed Suppor Wound Margin: Distinct, outline attached Exudate Amount: Medium Exudate Type: Serosanguineous Exudate Color: red, brown [name] Structures Foul Odor After Cleansing: No Slough/Fibrino Yes Wound Bed  Granulation Amount: Medium (34-66%) Exposed Structure Granulation Quality: Red Fascia Exposed: No Necrotic Amount: Medium (34-66%) Fat Layer (Subcutaneous Tissue) Exposed: Yes Necrotic Quality: Adherent Slough Tendon Exposed: No Muscle Exposed: No Joint Exposed: No Bone Exposed: No Periwound Skin Texture Texture Color No Abnormalities Noted: No No Abnormalities Noted: No Callus: No Atrophie Blanche: No Crepitus: No Cyanosis: No Excoriation: No Ecchymosis: No Induration: No Erythema: No Rash: No Hemosiderin Staining: No Scarring: No Mottled: No Pallor:  No Moisture Rubor: No No Abnormalities Noted: No Dry / Scaly: No Maceration: No Electronic Signature(s) Signed: 08/14/2022 4:56:09 PM By: Shawn Stall RN, [name] Entered By: Shawn Stall on 08/13/2022 07:56:10 -------------------------------------------------------------------------------- Vitals Details Patient Name: Date of Service: [name]. 08/13/2022 10:30 A M Medical Record Number: [id number] Patient Account Number: [id number] Date of Birth/Sex: Treating RN: Jun 07, 1928 (89 y.o. F) Primary Care Fitzroy Mikami: [name], [name] Other Clinician: Referring Mariene Dickerman: Treating Grover Robinson/Extender: Hoffman, [name], [name] Weeks in Treatment: 2 Vital Signs [name], [name] ([id number]) [id number] Page 8 of 8 Time Taken: 10:49 Temperature (F): 97.7 Height (in): 60 Pulse (bpm): 120 Weight (lbs): 97 Respiratory Rate (breaths/min): 18 Body Mass Index (BMI): 18.9 Blood Pressure (mmHg): 170/77 Reference Range: 80 - 120 mg / dl Electronic Signature(s) Signed: 08/15/2022 11:54:52 AM By: Thayer Dallas Entered By: Thayer Dallas on 08/13/2022 07:50:34

## 2022-08-16 ENCOUNTER — Encounter (HOSPITAL_BASED_OUTPATIENT_CLINIC_OR_DEPARTMENT_OTHER): Payer: Medicare Other | Admitting: Internal Medicine

## 2022-08-16 DIAGNOSIS — I87312 Chronic venous hypertension (idiopathic) with ulcer of left lower extremity: Secondary | ICD-10-CM | POA: Diagnosis not present

## 2022-08-16 DIAGNOSIS — I10 Essential (primary) hypertension: Secondary | ICD-10-CM | POA: Diagnosis not present

## 2022-08-16 DIAGNOSIS — M1990 Unspecified osteoarthritis, unspecified site: Secondary | ICD-10-CM | POA: Diagnosis not present

## 2022-08-16 DIAGNOSIS — L97822 Non-pressure chronic ulcer of other part of left lower leg with fat layer exposed: Secondary | ICD-10-CM | POA: Diagnosis not present

## 2022-08-16 DIAGNOSIS — I872 Venous insufficiency (chronic) (peripheral): Secondary | ICD-10-CM | POA: Diagnosis not present

## 2022-08-16 NOTE — Progress Notes (Signed)
[name] ([id number]) [id number] Page 1 of 5 Visit Report for 08/16/2022 Arrival Information Details Patient Name: Date of Service: [name], [name]. 08/16/2022 3:45 PM Medical Record Number: [id number] Patient Account Number: [id number] Date of Birth/Sex: Treating RN: Feb 28, 1928 (89 y.o. F) Primary Care Kevin Space: [place], Betty Other Clinician: Referring Sarabelle Genson: Treating Halvor Behrend/Extender: Hoffman, [name] [place], Betty Weeks in Treatment: 2 Visit Information History Since Last Visit Added or deleted any medications: No Patient Arrived: [name] Any new allergies or adverse reactions: No Arrival Time: 15:30 Had a fall or experienced change in No Accompanied By: daughter activities of daily living that may affect Transfer Assistance: None risk of falls: Patient Identification Verified: Yes Signs or symptoms of abuse/neglect since last visito No Secondary Verification Process Completed: Yes Hospitalized since last visit: No Implantable device outside of the clinic excluding No cellular tissue based products placed in the center since last visit: Has Dressing in Place as Prescribed: Yes Has Compression in Place as Prescribed: Yes Pain Present Now: No Electronic Signature(s) Signed: 08/16/2022 4:36:58 PM By: Thayer Dallas Entered By: Thayer Dallas on 08/16/2022 16:33:13 -------------------------------------------------------------------------------- Clinic Level of Care Assessment Details Patient Name: Date of Service: [name] [name], [name]. 08/16/2022 3:45 PM Medical Record Number: [id number] Patient Account Number: [id number] Date of Birth/Sex: Treating RN: [birth year] (89 y.o. F) Primary Care Blythe Veach: [place], Betty Other Clinician: Thayer Dallas Referring Shellie Rogoff: Treating Dalbert Stillings/Extender: Hoffman, [name] [place], Betty Weeks in Treatment: 2 Clinic Level of Care Assessment Items TOOL 4 Quantity Score X- 1 0 Use when only an  EandM is performed on FOLLOW-UP visit ASSESSMENTS - Nursing Assessment / Reassessment X- 1 10 Reassessment of Co-morbidities (includes updates in patient status) X- 1 5 Reassessment of Adherence to Treatment Plan ASSESSMENTS - Wound and Skin A ssessment / Reassessment []  - 0 Simple Wound Assessment / Reassessment - one wound []  - 0 Complex Wound Assessment / Reassessment - multiple wounds []  - 0 Dermatologic / Skin Assessment (not related to wound area) ASSESSMENTS - Focused Assessment []  - 0 Circumferential Edema Measurements - multi extremities []  - 0 Nutritional Assessment / Counseling / Intervention [name] ([id number]) [id number] Page 2 of 5 []  - 0 Lower Extremity Assessment (monofilament, tuning fork, pulses) []  - 0 Peripheral Arterial Disease Assessment (using hand held doppler) ASSESSMENTS - Ostomy and/or Continence Assessment and Care []  - 0 Incontinence Assessment and Management []  - 0 Ostomy Care Assessment and Management (repouching, etc.) PROCESS - Coordination of Care X - Simple Patient / Family Education for ongoing care 1 15 []  - 0 Complex (extensive) Patient / Family Education for ongoing care []  - 0 Staff obtains Chiropractor, Records, [name] Results / Process Orders est []  - 0 Staff telephones HHA, Nursing Homes / Clarify orders / etc []  - 0 Routine Transfer to another Facility (non-emergent condition) []  - 0 Routine Hospital Admission (non-emergent condition) []  - 0 New Admissions / Manufacturing engineer / Ordering NPWT Apligraf, etc. , []  - 0 Emergency Hospital Admission (emergent condition) X- 1 10 Simple Discharge Coordination []  - 0 Complex (extensive) Discharge Coordination PROCESS - Special Needs []  - 0 Pediatric / Minor Patient Management []  - 0 Isolation Patient Management []  - 0 Hearing / Language / Visual special needs []  - 0 Assessment of Community assistance (transportation, D/C planning,  etc.) []  - 0 Additional assistance / Altered mentation []  - 0 Support Surface(s) Assessment (bed, cushion, seat, etc.) INTERVENTIONS - Wound Cleansing / Measurement X - Simple Wound Cleansing - one wound 1 5 []  -  0 Complex Wound Cleansing - multiple wounds []  - 0 Wound Imaging (photographs - any number of wounds) []  - 0 Wound Tracing (instead of photographs) []  - 0 Simple Wound Measurement - one wound []  - 0 Complex Wound Measurement - multiple wounds INTERVENTIONS - Wound Dressings []  - 0 Small Wound Dressing one or multiple wounds X- 1 15 Medium Wound Dressing one or multiple wounds []  - 0 Large Wound Dressing one or multiple wounds []  - 0 Application of Medications - topical []  - 0 Application of Medications - injection INTERVENTIONS - Miscellaneous []  - 0 External ear exam []  - 0 Specimen Collection (cultures, biopsies, blood, body fluids, etc.) []  - 0 Specimen(s) / Culture(s) sent or taken to Lab for analysis []  - 0 Patient Transfer (multiple staff / Nurse, adult / Similar devices) []  - 0 Simple Staple / Suture removal (25 or less) []  - 0 Complex Staple / Suture removal (26 or more) []  - 0 Hypo / Hyperglycemic Management (close monitor of Blood Glucose) [name] ([id number]) [id number] Page 3 of 5 []  - 0 Ankle / Brachial Index (ABI) - do not check if billed separately []  - 0 Vital Signs Has the patient been seen at the hospital within the last three years: Yes Total Score: 60 Level Of Care: New/Established - Level 2 Electronic Signature(s) Signed: 08/16/2022 4:36:58 PM By: Thayer Dallas Entered By: Thayer Dallas on 08/16/2022 16:34:43 -------------------------------------------------------------------------------- Encounter Discharge Information Details Patient Name: Date of Service: [name] [name] Ngo [name]. 08/16/2022 3:45 PM Medical Record Number: [id number] Patient Account Number: [id number] Date of Birth/Sex:  Treating RN: 1928-10-14 (89 y.o. F) Primary Care [name]: [place], Betty Other Clinician: Thayer Dallas Referring [name]: Treating [name]/Extender: Hoffman, [name] [place], Betty Weeks in Treatment: 2 Encounter Discharge Information Items Discharge Condition: Stable Ambulatory Status: Cane Discharge Destination: Home Transportation: Private Auto Accompanied By: daughter Schedule Follow-up Appointment: Yes Clinical Summary of Care: Electronic Signature(s) Signed: 08/16/2022 4:36:58 PM By: Thayer Dallas Entered By: Thayer Dallas on 08/16/2022 16:35:31 -------------------------------------------------------------------------------- Patient/Caregiver Education Details Patient Name: Date of Service: [name] Ezzard Standing 7/25/2024andnbsp3:45 PM Medical Record Number: [id number] Patient Account Number: [id number] Date of Birth/Gender: Treating RN: [birth year] (89 y.o. F) Primary Care Physician: [place], Betty Other Clinician: Thayer Dallas Referring Physician: Treating Physician/Extender: Hoffman, [name] [place], Betty Weeks in Treatment: 2 Education Assessment Education Provided To: Patient Education Topics Provided Electronic Signature(s) Signed: 08/16/2022 4:36:58 PM By: Thayer Dallas Entered By: Thayer Dallas on 08/16/2022 16:35:06 Hamm, Blaine [name] ([id number]) [id number] Page 4 of 5 -------------------------------------------------------------------------------- Wound Assessment Details Patient Name: Date of Service: [name] [name], [name] 08/16/2022 3:45 PM Medical Record Number: [id number] Patient Account Number: [id number] Date of Birth/Sex: Treating RN: 28-Jan-1928 (89 y.o. F) Primary Care Mattox Schorr: [place], Betty Other Clinician: Referring Sidonie Dexheimer: Treating Yovan Leeman/Extender: Hoffman, [name] [place], Betty Weeks in Treatment: 2 Wound Status Wound Number: 4 Primary Etiology: Trauma, Other Wound Location: Left, Anterior Lower  Leg Wound Status: Open Wounding Event: Skin Tear/Laceration Date Acquired: 05/22/2022 Weeks Of Treatment: 2 Clustered Wound: No Wound Measurements Length: (cm) 7.3 Width: (cm) 2.3 Depth: (cm) 0.2 Area: (cm) 13.187 Volume: (cm) 2.637 % Reduction in Area: 17.1% % Reduction in Volume: 17.1% Wound Description Classification: Full Thickness With Exposed Support S Exudate Amount: Medium Exudate Type: Serosanguineous Exudate Color: red, brown tructures Periwound Skin Texture Texture Color No Abnormalities Noted: No No Abnormalities Noted: No Moisture No Abnormalities Noted: No Treatment Notes Wound #4 (Lower Leg) Wound Laterality: Left, Anterior Cleanser Soap and Water Discharge Instruction: May shower  and wash wound with dial antibacterial soap and water prior to dressing change. Vashe 5.8 (oz) Discharge Instruction: Cleanse the wound with Vashe prior to applying a clean dressing using gauze sponges, not tissue or cotton balls. Peri-Wound Care Sween Lotion (Moisturizing lotion) Discharge Instruction: Apply moisturizing lotion as directed Topical Gentamicin Discharge Instruction: As directed by physician Mupirocin Ointment Discharge Instruction: Apply Mupirocin (Bactroban) as instructed Santyl Discharge Instruction: apply directly wound bed. Primary Dressing Hydrofera Blue Ready Transfer Foam, 4x5 (in/in) Discharge Instruction: Apply to wound bed as instructed Secondary Dressing ABD Pad, 8x10 Discharge Instruction: Apply over primary dressing as directed. [place]airn, Aisha [name] ([id number]) [id number] Page 5 of 5 Woven Gauze Sponge, Non-Sterile 4x4 in Discharge Instruction: Apply over primary dressing as directed. Secured With Compression Wrap Kerlix Roll 4.5x3.1 (in/yd) Discharge Instruction: Apply Kerlix and Coban compression as directed. Coban Self-Adherent Wrap 4x5 (in/yd) Discharge Instruction: Apply over Kerlix as directed. Compression  Stockings Add-Ons Electronic Signature(s) Signed: 08/16/2022 4:36:58 PM By: Thayer Dallas Entered By: Thayer Dallas on 08/16/2022 16:33:41 -------------------------------------------------------------------------------- Vitals Details Patient Name: Date of Service: [name] [name] Ngo [name]. 08/16/2022 3:45 PM Medical Record Number: [id number] Patient Account Number: [id number] Date of Birth/Sex: Treating RN: Dec 03, 1928 (89 y.o. F) Primary Care Samari Bittinger: [place], Betty Other Clinician: Referring Aundrea Higginbotham: Treating Anay Rathe/Extender: Hoffman, [name] [place], Betty Weeks in Treatment: 2 Vital Signs Time Taken: 16:00 Reference Range: 80 - 120 mg / dl Height (in): 60 Weight (lbs): 97 Body Mass Index (BMI): 18.9 Electronic Signature(s) Signed: 08/16/2022 4:36:58 PM By: Thayer Dallas Entered By: Thayer Dallas on 08/16/2022 16:33:20

## 2022-08-17 NOTE — Progress Notes (Signed)
Iolanda, Lennert Jung T ([id number]) [id number] Page 1 of 1 Visit Report for 08/16/2022 SuperBill Details Patient Name: [name], Laiana T. Date of Service: 08/16/2022 Medical Record Number: [id number] Patient Account Number: [id number] Date of Birth/Sex: Treating RN: May 21, 1928 (89 y.o. F) Primary Care Provider: [place], Betty Other Clinician: Referring Provider: Treating Provider/Extender: Tavon Magnussen [place], Betty Weeks in Treatment: 2 Diagnosis Coding ICD-10 Codes Code Description T79.8XXA Other early complications of trauma, initial encounter L97.822 Non-pressure chronic ulcer of other part of left lower leg with fat layer exposed I87.312 Chronic venous hypertension (idiopathic) with ulcer of left lower extremity M35.3 Polymyalgia rheumatica Z92.241 Personal history of systemic steroid therapy Facility Procedures CPT4 Code Description Modifier Quantity [id number] [phone] - WOUND CARE VISIT-LEV 2 EST PT 1 Electronic Signature(s) Signed: 08/16/2022 4:36:58 PM By: Thayer Dallas Signed: 08/17/2022 12:44:10 PM By: Geralyn Corwin DO Entered By: Thayer Dallas on 08/16/2022 16:35:49

## 2022-08-20 ENCOUNTER — Encounter (HOSPITAL_BASED_OUTPATIENT_CLINIC_OR_DEPARTMENT_OTHER): Payer: Medicare Other | Admitting: Internal Medicine

## 2022-08-20 DIAGNOSIS — I872 Venous insufficiency (chronic) (peripheral): Secondary | ICD-10-CM | POA: Diagnosis not present

## 2022-08-20 DIAGNOSIS — I10 Essential (primary) hypertension: Secondary | ICD-10-CM | POA: Diagnosis not present

## 2022-08-20 DIAGNOSIS — L97822 Non-pressure chronic ulcer of other part of left lower leg with fat layer exposed: Secondary | ICD-10-CM | POA: Diagnosis not present

## 2022-08-20 DIAGNOSIS — I87312 Chronic venous hypertension (idiopathic) with ulcer of left lower extremity: Secondary | ICD-10-CM | POA: Diagnosis not present

## 2022-08-20 DIAGNOSIS — M1990 Unspecified osteoarthritis, unspecified site: Secondary | ICD-10-CM | POA: Diagnosis not present

## 2022-08-21 NOTE — Progress Notes (Signed)
Riviera, Quiram Aleaya T ([id number]) [id number] Page 1 of 10 Visit Report for 08/20/2022 Chief Complaint Document Details Patient Name: Date of Service: [name] URIANA, BUECHE T. 08/20/2022 3:00 PM Medical Record Number: [id number] Patient Account Number: [id number] Date of Birth/Sex: Treating RN: [birth year] (89 y.o. F) Primary Care Provider: [place], Betty Other Clinician: Referring Provider: Treating Provider/Extender: Giordano Getman [place], Betty Weeks in Treatment: 3 Information Obtained from: Patient Chief Complaint 07/30/2022; left lower extremity wound secondary to trauma Electronic Signature(s) Signed: 08/20/2022 5:19:21 PM By: Geralyn Corwin DO Entered By: Geralyn Corwin on 08/20/2022 16:03:41 -------------------------------------------------------------------------------- Debridement Details Patient Name: Date of Service: [name] [name] 08/20/2022 3:00 PM Medical Record Number: [id number] Patient Account Number: [id number] Date of Birth/Sex: Treating RN: 12/29/1928 (89 y.o. Dola Argyle, Lyla Son Primary Care Provider: [place], Betty Other Clinician: Referring Provider: Treating Provider/Extender: Soren Lazarz [place], Betty Weeks in Treatment: 3 Debridement Performed for Assessment: Wound #4 Left,Anterior Lower Leg Performed By: Physician Geralyn Corwin, DO Debridement Type: Debridement Level of Consciousness (Pre-procedure): Awake and Alert Pre-procedure Verification/Time Out Yes - 15:48 Taken: Start Time: 15:52 Pain Control: Lidocaine 4% T opical Solution Percent of Wound Bed Debrided: 100% T Area Debrided (cm): otal 10.29 Tissue and other material debrided: Non-Viable, Slough, Slough Level: Non-Viable Tissue Debridement Description: Selective/Open Wound Instrument: Curette Bleeding: Minimum Hemostasis Achieved: Pressure Response to Treatment: Procedure was tolerated well Level of Consciousness (Post- Awake and  Alert procedure): Post Debridement Measurements of Total Wound Length: (cm) 6.9 Width: (cm) 1.9 Depth: (cm) 0.2 Volume: (cm) 2.059 Character of Wound/Ulcer Post Debridement: Improved Post Procedure Diagnosis Same as Pre-procedure Dion, Freddye T ([id number]) [id number] Page 2 of 10 Notes Scribed for Dr Mikey Bussing by Redmond Pulling, RN Electronic Signature(s) Signed: 08/20/2022 5:19:21 PM By: Geralyn Corwin DO Signed: 08/20/2022 5:33:27 PM By: Redmond Pulling RN, [name] Entered By: Redmond Pulling on 08/20/2022 15:53:47 -------------------------------------------------------------------------------- HPI Details Patient Name: Date of Service: [name], Mateya T. 08/20/2022 3:00 PM Medical Record Number: [id number] Patient Account Number: [id number] Date of Birth/Sex: Treating RN: 14-May-1928 (89 y.o. F) Primary Care Provider: [place], Betty Other Clinician: Referring Provider: Treating Provider/Extender: Vanya Carberry [place], Betty Weeks in Treatment: 3 History of Present Illness HPI Description: ADMISSION 10/06/2018 This is an 89 year old woman who is here for wounds on her bilateral anterior lower legs. She was previously seen in early 2017 with a wound on her left leg that was traumatic. Felt to have lymphedema and venous insufficiency at that time although she had a reflux study that did not show significant superficial reflux in the left lower extremity. This healed. At some point she has had stockings prescribed but she cannot get the standard stocking on. The patient is also on chronic prednisone I think related to polymyalgia rheumatica currently at 2.5 mg daily. The patient states that in July she slipped out of bed traumatizing the anterior part of her leg. She was seen by her primary care physicians beginning on 08/14/2018. It was noted to have some erythema and swelling she was given Keflex. On 7/30 she had doxycycline and I think he has had  perhaps another round of doxycycline after this. Noted in their notes to have a history of chronic venous insufficiency and some edema. Sometime in August she traumatized her left anterior leg with the tip of her cane and has an open wound in this area as well. She has not been doing anything specific to the wounds themselves leaving them open to air and putting Band-Aids on top. Past medical history; hypertension,  polymyalgia rheumatica on chronic prednisone, history of microdiscectomy, B12 deficiency, vitamin D deficiency, emphysema, osteoporosis ABIs in our clinic were 0.96 on the right and 0.99 on the left. 9/22; 2 small wounds on the left anterior and right anterior tibial area. These are better than last week. We put her in 3 layer compression unfortunately she appears to have a new wound on the medial right lower leg anteriorly probably because of wrap injury. She is on chronic prednisone with a history of chronic polymyalgia rheumatica and has very frail skin on her lower extremities 9/29; 2 small wounds on the left anterior and right anterior tibial areas these continue to improve. The abrasion she had from last week appears to have closed over. 10/6; the patient had 2 wounds on the left anterior and right anterior tibial wounds. The left anterior wound is just about closed. Right anterior tibial is still open. 10/13; the patient's leg is healed on the left as predicted. The area on the right is still open but measuring smaller 10/20; the patient comes in today with a small area on the left medial lower leg. The area on the right is still open but measuring smaller. We had put her in a dual layer stocking last week on the left she is not able to get the 2 layers on she will only wear 1 layer. 10/27; the left medial lower leg is closed. The right one is just about closed. We continue to dress the right leg and compression we put her 11/5; the left medial leg is closed and she has a juxta  lite stocking on although her daughter had to apply it. The right one is closed today and we will put a juxta lite stocking on this leg as well. The patient states most of the skin changes are due to chronic steroid use. T the point this is true but she has tightly fibrotic o skin in her distal lower extremities suggesting some degree of chronic venous insufficiency as well. She does not have a lot of edema in her leg 07/30/2022 Ms. Daleysa Zeise is a 89 year old female with a past medical history of essential hypertension, polymyalgia rheumatica on chronic steroids, and venous insufficiency that presents to the clinic for a 9-week history of nonhealing wound to the left lower extremities. She states she hit her leg against a table and developed a hematoma that eventually opened and drained. She has been using hydrogen peroxide to the wound bed. She currently denies signs of infection. She has not been on antibiotics. She has compression stockings at home but has difficulty putting these on. She states she cannot even put on a sock. 7/15; patient presents for follow-up. She has been using Vashe wet-to-dry dressings although technique is questionable. She has left this open to air for several days because she did not think she had the right gauze to use for the wet-to-dry dressing. 7/22; patient presents for follow-up. She has been using Vashe wet-to-dry dressings. More healthy granulation tissue present today. She had ABIs completed that showed an ABI of 1.07 on the left with biphasic waveforms throughout the system. 7/29; patient presents for follow-up. We have been using Santyl and Hydrofera Blue under Kerlix/Coban. She has tolerated this compression wrap well. Wound is smaller. Electronic Signature(s) Signed: 08/20/2022 5:19:21 PM By: Geralyn Corwin DO Entered By: Geralyn Corwin on 08/20/2022 16:05:10 Frederic, Shenica T ([id number]) [id number] Page 3 of  10 -------------------------------------------------------------------------------- Physical Exam Details Patient Name: Date of Service: [name], Demari  T. 08/20/2022 3:00 PM Medical Record Number: [id number] Patient Account Number: [id number] Date of Birth/Sex: Treating RN: 29-Oct-1928 (89 y.o. F) Primary Care Provider: [place], Betty Other Clinician: Referring Provider: Treating Provider/Extender: Aaden Buckman [place], Betty Weeks in Treatment: 3 Constitutional respirations regular, non-labored and within target range for patient.. Cardiovascular 2+ dorsalis pedis/posterior tibialis pulses. Psychiatric pleasant and cooperative. Notes Left lower extremity: T the anterior aspect there is an open wound with granulation tissue and nonviable tissue. No surrounding signs of infection. Venous o stasis dermatitis noted throughout the leg. Electronic Signature(s) Signed: 08/20/2022 5:19:21 PM By: Geralyn Corwin DO Entered By: Geralyn Corwin on 08/20/2022 16:05:31 -------------------------------------------------------------------------------- Physician Orders Details Patient Name: Date of Service: [name] [name] 08/20/2022 3:00 PM Medical Record Number: [id number] Patient Account Number: [id number] Date of Birth/Sex: Treating RN: 11-21-1928 (89 y.o. Orville Govern Primary Care Provider: [place], Betty Other Clinician: Referring Provider: Treating Provider/Extender: Tallyn Holroyd [place], Betty Weeks in Treatment: 3 Verbal / Phone Orders: No Diagnosis Coding Follow-up Appointments ppointment in 1 week. - Dr. Mikey Bussing - room 9 2pm 08/27/2022 Monday Return A ppointment in 2 weeks. - Dr. Mikey Bussing room 9 Return A Anesthetic (In clinic) Topical Lidocaine 4% applied to wound bed Bathing/ Shower/ Hygiene May shower and wash wound with soap and water. Edema Control - Lymphedema / SCD / Other Bilateral Lower Extremities Elevate legs to the level of the heart or above for  30 minutes daily and/or when sitting for 3-4 times a day throughout the day. Avoid standing for long periods of time. Wound Treatment Wound #4 - Lower Leg Wound Laterality: Left, Anterior Cleanser: Soap and Water 1 x Per Week/30 Days Discharge Instructions: May shower and wash wound with dial antibacterial soap and water prior to dressing change. Cleanser: Vashe 5.8 (oz) 1 x Per Week/30 Days Discharge Instructions: Cleanse the wound with Vashe prior to applying a clean dressing using gauze sponges, not tissue or cotton balls. Heavynn, Vanderhyde Anishka T ([id number]) [id number] Page 4 of 10 Peri-Wound Care: Sween Lotion (Moisturizing lotion) 1 x Per Week/30 Days Discharge Instructions: Apply moisturizing lotion as directed Topical: Gentamicin 1 x Per Week/30 Days Discharge Instructions: As directed by physician Topical: Mupirocin Ointment 1 x Per Week/30 Days Discharge Instructions: Apply Mupirocin (Bactroban) as instructed Topical: Santyl 1 x Per Week/30 Days Discharge Instructions: apply directly wound bed. Prim Dressing: Hydrofera Blue Ready Transfer Foam, 4x5 (in/in) 1 x Per Week/30 Days ary Discharge Instructions: Apply to wound bed as instructed Secondary Dressing: ABD Pad, 8x10 1 x Per Week/30 Days Discharge Instructions: Apply over primary dressing as directed. Secondary Dressing: Woven Gauze Sponge, Non-Sterile 4x4 in 1 x Per Week/30 Days Discharge Instructions: Apply over primary dressing as directed. Compression Wrap: Kerlix Roll 4.5x3.1 (in/yd) (Generic) 1 x Per Week/30 Days Discharge Instructions: Apply Kerlix and Coban compression as directed. Compression Wrap: Coban Self-Adherent Wrap 4x5 (in/yd) 1 x Per Week/30 Days Discharge Instructions: Apply over Kerlix as directed. Patient Medications llergies: aspirin, codeine, Bactrim A Notifications Medication Indication Start End 08/20/2022 lidocaine DOSE topical 4 % cream - cream topical once  daily Electronic Signature(s) Signed: 08/20/2022 5:19:21 PM By: Geralyn Corwin DO Entered By: Geralyn Corwin on 08/20/2022 16:05:38 -------------------------------------------------------------------------------- Problem List Details Patient Name: Date of Service: [name] [name] 08/20/2022 3:00 PM Medical Record Number: [id number] Patient Account Number: [id number] Date of Birth/Sex: Treating RN: 04-17-1928 (89 y.o. F) Primary Care Provider: [place], Betty Other Clinician: Referring Provider: Treating Provider/Extender: Jerard Bays [place], Betty Weeks in Treatment: 3 Active Problems ICD-10  Encounter Code Description Active Date MDM Diagnosis T79.8XXA Other early complications of trauma, initial encounter 07/30/2022 No Yes L97.822 Non-pressure chronic ulcer of other part of left lower leg with fat layer exposed7/08/2022 No Yes I87.312 Chronic venous hypertension (idiopathic) with ulcer of left lower extremity 07/30/2022 No Yes Vicencio, Samie T ([id number]) [phone].pdf Page 5 of 10 M35.3 Polymyalgia rheumatica 07/30/2022 No Yes Z92.241 Personal history of systemic steroid therapy 07/30/2022 No Yes Inactive Problems Resolved Problems Electronic Signature(s) Signed: 08/20/2022 5:19:21 PM By: Geralyn Corwin DO Entered By: Geralyn Corwin on 08/20/2022 16:03:31 -------------------------------------------------------------------------------- Progress Note Details Patient Name: Date of Service: [name] [name] 08/20/2022 3:00 PM Medical Record Number: [id number] Patient Account Number: [id number] Date of Birth/Sex: Treating RN: 16-Aug-1928 (89 y.o. F) Primary Care Provider: [place], Betty Other Clinician: Referring Provider: Treating Provider/Extender: Jasiri Hanawalt [place], Betty Weeks in Treatment: 3 Subjective Chief Complaint Information obtained from Patient 07/30/2022; left lower extremity wound secondary to trauma History of  Present Illness (HPI) ADMISSION 10/06/2018 This is an 89 year old woman who is here for wounds on her bilateral anterior lower legs. She was previously seen in early 2017 with a wound on her left leg that was traumatic. Felt to have lymphedema and venous insufficiency at that time although she had a reflux study that did not show significant superficial reflux in the left lower extremity. This healed. At some point she has had stockings prescribed but she cannot get the standard stocking on. The patient is also on chronic prednisone I think related to polymyalgia rheumatica currently at 2.5 mg daily. The patient states that in July she slipped out of bed traumatizing the anterior part of her leg. She was seen by her primary care physicians beginning on 08/14/2018. It was noted to have some erythema and swelling she was given Keflex. On 7/30 she had doxycycline and I think he has had perhaps another round of doxycycline after this. Noted in their notes to have a history of chronic venous insufficiency and some edema. Sometime in August she traumatized her left anterior leg with the tip of her cane and has an open wound in this area as well. She has not been doing anything specific to the wounds themselves leaving them open to air and putting Band-Aids on top. Past medical history; hypertension, polymyalgia rheumatica on chronic prednisone, history of microdiscectomy, B12 deficiency, vitamin D deficiency, emphysema, osteoporosis ABIs in our clinic were 0.96 on the right and 0.99 on the left. 9/22; 2 small wounds on the left anterior and right anterior tibial area. These are better than last week. We put her in 3 layer compression unfortunately she appears to have a new wound on the medial right lower leg anteriorly probably because of wrap injury. She is on chronic prednisone with a history of chronic polymyalgia rheumatica and has very frail skin on her lower extremities 9/29; 2 small wounds on the  left anterior and right anterior tibial areas these continue to improve. The abrasion she had from last week appears to have closed over. 10/6; the patient had 2 wounds on the left anterior and right anterior tibial wounds. The left anterior wound is just about closed. Right anterior tibial is still open. 10/13; the patient's leg is healed on the left as predicted. The area on the right is still open but measuring smaller 10/20; the patient comes in today with a small area on the left medial lower leg. The area on the right is still open but measuring smaller. We had put  her in a dual layer stocking last week on the left she is not able to get the 2 layers on she will only wear 1 layer. 10/27; the left medial lower leg is closed. The right one is just about closed. We continue to dress the right leg and compression we put her 11/5; the left medial leg is closed and she has a juxta lite stocking on although her daughter had to apply it. The right one is closed today and we will put a juxta lite stocking on this leg as well. The patient states most of the skin changes are due to chronic steroid use. T the point this is true but she has tightly fibrotic o skin in her distal lower extremities suggesting some degree of chronic venous insufficiency as well. She does not have a lot of edema in her leg 07/30/2022 Ms. Litsy Drakeford is a 89 year old female with a past medical history of essential hypertension, polymyalgia rheumatica on chronic steroids, and venous insufficiency that presents to the clinic for a 9-week history of nonhealing wound to the left lower extremities. She states she hit her leg against a table and developed a hematoma that eventually opened and drained. She has been using hydrogen peroxide to the wound bed. She currently denies signs of infection. She has not been on antibiotics. She has compression stockings at home but has difficulty putting these on. She states she cannot even put  on a sock. Sinahi, Mehrotra Greenlee T ([id number]) [id number] Page 6 of 10 7/15; patient presents for follow-up. She has been using Vashe wet-to-dry dressings although technique is questionable. She has left this open to air for several days because she did not think she had the right gauze to use for the wet-to-dry dressing. 7/22; patient presents for follow-up. She has been using Vashe wet-to-dry dressings. More healthy granulation tissue present today. She had ABIs completed that showed an ABI of 1.07 on the left with biphasic waveforms throughout the system. 7/29; patient presents for follow-up. We have been using Santyl and Hydrofera Blue under Kerlix/Coban. She has tolerated this compression wrap well. Wound is smaller. Patient History Information obtained from Patient. Family History Cancer - Father, Heart Disease - Father, Lung Disease - Father, No family history of Diabetes, Hereditary Spherocytosis, Hypertension, Seizures, Stroke, Thyroid Problems, Tuberculosis. Social History Former smoker, Marital Status - Widowed, Alcohol Use - Moderate, Drug Use - No History, Caffeine Use - Moderate. Medical History Eyes Denies history of Cataracts, Glaucoma, Optic Neuritis Ear/Nose/Mouth/Throat Denies history of Chronic sinus problems/congestion, Middle ear problems Hematologic/Lymphatic Patient has history of Anemia Denies history of Hemophilia, Human Immunodeficiency Virus, Lymphedema, Sickle Cell Disease Respiratory Denies history of Aspiration, Asthma, Chronic Obstructive Pulmonary Disease (COPD), Pneumothorax, Sleep Apnea, Tuberculosis Cardiovascular Patient has history of Hypertension Denies history of Angina, Arrhythmia, Congestive Heart Failure, Coronary Artery Disease, Deep Vein Thrombosis, Hypotension, Myocardial Infarction, Peripheral Arterial Disease, Peripheral Venous Disease, Phlebitis, Vasculitis Gastrointestinal Denies history of Cirrhosis , Colitis,  Crohns, Hepatitis A, Hepatitis B, Hepatitis C Endocrine Denies history of Type I Diabetes, Type II Diabetes Genitourinary Denies history of End Stage Renal Disease Immunological Denies history of Lupus Erythematosus, Raynauds, Scleroderma Integumentary (Skin) Denies history of History of Burn Musculoskeletal Patient has history of Osteoarthritis Denies history of Gout, Rheumatoid Arthritis, Osteomyelitis Neurologic Denies history of Dementia, Neuropathy, Quadriplegia, Paraplegia, Seizure Disorder Oncologic Denies history of Received Chemotherapy, Received Radiation Psychiatric Denies history of Anorexia/bulimia, Confinement Anxiety Objective Constitutional respirations regular, non-labored and within target range for patient.. Vitals Time Taken:  3:22 PM, Height: 60 in, Weight: 97 lbs, BMI: 18.9, Temperature: 98.0 F, Pulse: 102 bpm, Respiratory Rate: 18 breaths/min, Blood Pressure: 171/96 mmHg. Cardiovascular 2+ dorsalis pedis/posterior tibialis pulses. Psychiatric pleasant and cooperative. General Notes: Left lower extremity: T the anterior aspect there is an open wound with granulation tissue and nonviable tissue. No surrounding signs of o infection. Venous stasis dermatitis noted throughout the leg. Integumentary (Hair, Skin) Wound #4 status is Open. Original cause of wound was Skin T ear/Laceration. The date acquired was: 05/22/2022. The wound has been in treatment 3 weeks. The wound is located on the Left,Anterior Lower Leg. The wound measures 6.9cm length x 1.9cm width x 0.2cm depth; 10.297cm^2 area and 2.059cm^3 volume. There is Fat Layer (Subcutaneous Tissue) exposed. There is no tunneling or undermining noted. There is a medium amount of serosanguineous drainage noted. There is medium (34-66%) **red granulation within the wound bed. There is a medium (34-66%) amount of necrotic tissue within the wound bed including Adherent Slough. The periwound skin appearance exhibited:  Hemosiderin Staining. 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 I debrided nonviable tissue. I recommended continuing the course with Santyl and Hydrofera Blue under compression wrap. Follow-up in 1 week. Procedures Wound #4 Pre-procedure diagnosis of Wound #4 is a Trauma, Other located on the Left,Anterior Lower Leg . There was a Selective/Open Wound Non-Viable Tissue Debridement with a total area of 10.29 sq cm performed by Geralyn Corwin, DO. With the following instrument(s): Curette to remove Non-Viable tissue/material. Material removed includes [facility] after achieving pain control using Lidocaine 4% Topical Solution. No specimens were taken. A time out was conducted at 15:48, prior to the start of the procedure. A Minimum amount of bleeding was controlled with Pressure. The procedure was tolerated well. Post Debridement Measurements: 6.9cm length x 1.9cm width x 0.2cm depth; 2.059cm^3 volume. Character of Wound/Ulcer Post Debridement is improved. Post procedure Diagnosis Wound #4: Same as Pre-Procedure General Notes: Scribed for Dr Mikey Bussing by Redmond Pulling, RN. Plan Follow-up Appointments: Return Appointment in 1 week. - Dr. Mikey Bussing - room 9 2pm 08/27/2022 Monday Return Appointment in 2 weeks. - Dr. Mikey Bussing room 9 Anesthetic: (In clinic) Topical Lidocaine 4% applied to wound bed Bathing/ Shower/ Hygiene: May shower and wash wound with soap and water. Edema Control - Lymphedema / SCD / Other: Elevate legs to the level of the heart or above for 30  minutes daily and/or when sitting for 3-4 times a day throughout the day. Avoid standing for long periods of time. The following medication(s) was prescribed: lidocaine topical 4 % cream cream topical once daily was prescribed at facility WOUND #4: - Lower Leg Wound Laterality: Left, Anterior Cleanser: Soap and Water 1 x Per Week/30 Days Discharge Instructions: May shower and wash wound with dial antibacterial soap and water prior to dressing change. Cleanser: Vashe 5.8 (oz) 1 x Per Week/30 Days Discharge Instructions: Cleanse the wound with Vashe prior to applying a clean dressing using gauze sponges, not tissue or cotton balls. Peri-Wound Care: Sween Lotion (Moisturizing lotion) 1 x Per Week/30 Days Discharge Instructions: Apply moisturizing lotion as directed Topical: Gentamicin 1 x Per Week/30 Days Discharge Instructions: As directed by physician  Topical: Mupirocin Ointment 1 x Per Week/30 Days Discharge Instructions: Apply Mupirocin (Bactroban) as instructed Topical: Santyl 1 x Per Week/30 Days Discharge Instructions: apply directly wound bed. Prim Dressing: Hydrofera Blue Ready Transfer Foam, 4x5 (in/in) 1 x Per Week/30 Days ary Discharge Instructions: Apply to wound bed as instructed Secondary Dressing: ABD Pad, 8x10 1 x Per Week/30 Days Discharge Instructions: Apply over primary dressing as directed. Secondary Dressing: Woven Gauze Sponge, Non-Sterile 4x4 in 1 x Per Week/30 Days Discharge Instructions: Apply over primary dressing as directed. Com pression Wrap: Kerlix Roll 4.5x3.1 (in/yd) (Generic) 1 x Per Week/30 Days Discharge Instructions: Apply Kerlix and Coban compression as directed. Com pression Wrap: Coban Self-Adherent Wrap 4x5 (in/yd) 1 x Per Week/30 Days Discharge Instructions: Apply over Kerlix as directed. 1. In office sharp debridement 2. Hydrofera Blue and Santyl under Kerlix/Cobanleft lower extremity 3. Follow-up in 1 week Electronic Signature(s) Signed:  08/20/2022 5:19:21 PM By: Geralyn Corwin DO Chretien, Destini T ([id number]) [id number] Page 8 of 10 Entered By: Geralyn Corwin on 08/20/2022 16:06:42 -------------------------------------------------------------------------------- HxROS Details Patient Name: Date of Service: [name] BRINLEIGH, THU T. 08/20/2022 3:00 PM Medical Record Number: [id number] Patient Account Number: [id number] Date of Birth/Sex: Treating RN: [birth year] (89 y.o. F) Primary Care Provider: [place], Betty Other Clinician: Referring Provider: Treating Provider/Extender: Nita Whitmire [place], Betty Weeks in Treatment: 3 Information Obtained From Patient Eyes Medical History: Negative for: Cataracts; Glaucoma; Optic Neuritis Ear/Nose/Mouth/Throat Medical History: Negative for: Chronic sinus problems/congestion; Middle ear problems Hematologic/Lymphatic Medical History: Positive for: Anemia Negative for: Hemophilia; Human Immunodeficiency Virus; Lymphedema; Sickle Cell Disease Respiratory Medical History: Negative for: Aspiration; Asthma; Chronic Obstructive Pulmonary Disease (COPD); Pneumothorax; Sleep Apnea; Tuberculosis Cardiovascular Medical History: Positive for: Hypertension Negative for: Angina; Arrhythmia; Congestive Heart Failure; Coronary Artery Disease; Deep Vein Thrombosis; Hypotension; Myocardial Infarction; Peripheral Arterial Disease; Peripheral Venous Disease; Phlebitis; Vasculitis Gastrointestinal Medical History: Negative for: Cirrhosis ; Colitis; Crohns; Hepatitis A; Hepatitis B; Hepatitis C Endocrine Medical History: Negative for: Type I Diabetes; Type II Diabetes Genitourinary Medical History: Negative for: End Stage Renal Disease Immunological Medical History: Negative for: Lupus Erythematosus; Raynauds; Scleroderma Integumentary (Skin) Medical History: Negative for: History of Burn Musculoskeletal Medical History: Positive for:  Osteoarthritis Brissette, Kailynne T ([id number]) [phone].pdf Page 9 of 10 Negative for: Gout; Rheumatoid Arthritis; Osteomyelitis Neurologic Medical History: Negative for: Dementia; Neuropathy; Quadriplegia; Paraplegia; Seizure Disorder Oncologic Medical History: Negative for: Received Chemotherapy; Received Radiation Psychiatric Medical History: Negative for: Anorexia/bulimia; Confinement Anxiety Immunizations Pneumococcal Vaccine: Received Pneumococcal Vaccination: No Implantable Devices None Family and Social History Cancer: Yes - Father; Diabetes: No; Heart Disease: Yes - Father; Hereditary Spherocytosis: No; Hypertension: No; Lung Disease: Yes - Father; Seizures: No; Stroke: No; Thyroid Problems: No; Tuberculosis: No; Former smoker; Marital Status - Widowed; Alcohol Use: Moderate; Drug Use: No History; Caffeine Use: Moderate; Financial Concerns: No; Food, Clothing or Shelter Needs: No; Support System Lacking: No; Transportation Concerns: No Electronic Signature(s) Signed: 08/20/2022 5:19:21 PM By: Geralyn Corwin DO Entered By: Geralyn Corwin on 08/20/2022 16:05:16 -------------------------------------------------------------------------------- SuperBill Details Patient Name: Date of Service: [name] [name] 08/20/2022 Medical Record Number: [id number] Patient Account Number: [id number] Date of Birth/Sex: Treating RN: September 06, 1928 (89 y.o. F) Primary Care Provider: [place], Betty Other Clinician: Referring Provider: Treating Provider/Extender: Zoee Heeney [place], Betty Weeks in Treatment: 3 Diagnosis Coding ICD-10 Codes Code Description T79.8XXA Other early complications of trauma, initial encounter L97.822 Non-pressure chronic ulcer of other part of left lower leg with fat layer exposed I87.312 Chronic venous hypertension (idiopathic) with ulcer of left  lower extremity M35.3 Polymyalgia rheumatica Z92.241 Personal history of  systemic steroid therapy Facility Procedures : CPT4 Code: [id number] Description: [id number] - DEBRIDE WOUND 1ST 20 SQ CM OR < ICD-10 Diagnosis Description L97.822 Non-pressure chronic ulcer of other part of left lower leg with fat layer expose I87.312 Chronic venous hypertension (idiopathic) with ulcer of left lower extremity Modifier: d Quantity: 1 Physician Procedures : CPT4 Code Description Modifier [id number] - WC PHYS DEBR WO ANESTH 20 SQ CM Clauss, Jeaneane T ([id number]) [id number] Quantity: 1 .pdf Page 10 of 10 : ICD-10 Diagnosis Description L97.822 Non-pressure chronic ulcer of other part of left lower leg with fat layer exposed I87.312 Chronic venous hypertension (idiopathic) with ulcer of left lower extremity Quantity: Electronic Signature(s) Signed: 08/20/2022 5:19:21 PM By: Geralyn Corwin DO Entered By: Geralyn Corwin on 08/20/2022 16:07:44

## 2022-08-21 NOTE — Progress Notes (Signed)
[name] ([id number]) [id number] Page 1 of 7 Visit Report for 08/20/2022 Arrival Information Details Patient Name: Date of Service: [name] CHEYEN[place], INGEMI [name]. 08/20/2022 3:00 PM Medical Record Number: [id number] Patient Account Number: [id number] Date of Birth/Sex: Treating RN: [birth year] (89 y.o. [name] Primary Care Broox Lonigro: [place], Betty Other Clinician: Referring Dina Mobley: Treating Lorrin Bodner/Extender: Hoffman, [name] [place], Betty Weeks in Treatment: 3 Visit Information History Since Last Visit Added or deleted any medications: No Patient Arrived: [name] Any new allergies or adverse reactions: No Arrival Time: 15:15 Had a fall or experienced change in No Accompanied By: daughter activities of daily living that may affect Transfer Assistance: None risk of falls: Patient Identification Verified: Yes Signs or symptoms of abuse/neglect since last visito No Secondary Verification Process Completed: Yes Hospitalized since last visit: No Implantable device outside of the clinic excluding No cellular tissue based products placed in the center since last visit: Has Dressing in Place as Prescribed: Yes Has Compression in Place as Prescribed: Yes Pain Present Now: No Electronic Signature(s) Signed: 08/20/2022 5:33:27 PM By: Redmond Pulling RN, [name] Entered By: Redmond Pulling on 08/20/2022 15:20:28 -------------------------------------------------------------------------------- Encounter Discharge Information Details Patient Name: Date of Service: [name] [name] Ngo [name]. 08/20/2022 3:00 PM Medical Record Number: [id number] Patient Account Number: [id number] Date of Birth/Sex: Treating RN: 1928-02-20 (89 y.o. [name] Primary Care Alauna Hayden: [place], Betty Other Clinician: Referring Ellis Mehaffey: Treating Kiyla Ringler/Extender: Hoffman, [name] [place], Betty Weeks in Treatment: 3 Encounter Discharge Information Items Post Procedure  Vitals Discharge Condition: Stable Temperature (F): 98.0 Ambulatory Status: Cane Pulse (bpm): 102 Discharge Destination: Home Respiratory Rate (breaths/min): 18 Transportation: Private Auto Blood Pressure (mmHg): 171/96 Accompanied By: daughter Schedule Follow-up Appointment: Yes Clinical Summary of Care: Patient Declined Electronic Signature(s) Signed: 08/20/2022 5:33:27 PM By: Redmond Pulling RN, [name] Entered By: Redmond Pulling on 08/20/2022 16:45:10 Dumm, Ladona [name] ([id number]) [id number] Page 2 of 7 -------------------------------------------------------------------------------- Lower Extremity Assessment Details Patient Name: Date of Service: [name] [name], [name]. 08/20/2022 3:00 PM Medical Record Number: [id number] Patient Account Number: [id number] Date of Birth/Sex: Treating RN: 21-Jul-1928 (89 y.o. [name] Primary Care Marsi Turvey: [place], Betty Other Clinician: Referring Tiffanye Hartmann: Treating Ranon Coven/Extender: Hoffman, [name] [place], Betty Weeks in Treatment: 3 Edema Assessment Assessed: [Left: No] [Right: No] [Left: Edema] [Right: :] Calf Left: Right: Point of Measurement: 27 cm From Medial Instep 27 cm Ankle Left: Right: Point of Measurement: 10 cm From Medial Instep 17.8 cm Vascular Assessment Pulses: Dorsalis Pedis Palpable: [Left:Yes] Extremity colors, hair growth, and conditions: Extremity Color: [Left:Hyperpigmented] Hair Growth on Extremity: [Left:No] Temperature of Extremity: [Left:Warm] Capillary Refill: [Left:< 3 seconds] Dependent Rubor: [Left:No] Blanched when Elevated: [Left:No No] Toe Nail Assessment Left: Right: Thick: No Discolored: No Deformed: No Improper Length and Hygiene: No Electronic Signature(s) Signed: 08/20/2022 5:33:27 PM By: Redmond Pulling RN, [name] Entered By: Redmond Pulling on 08/20/2022 15:33:32 -------------------------------------------------------------------------------- Multi Wound  Chart Details Patient Name: Date of Service: [name] [name] Ngo [name]. 08/20/2022 3:00 PM Medical Record Number: [id number] Patient Account Number: [id number] Date of Birth/Sex: Treating RN: 1928-11-01 (89 y.o. F) Primary Care [name]: [place], Betty Other Clinician: Referring Brayan Votaw: Treating Zamora Colton/Extender: Hoffman, [name] [place], Betty Weeks in Treatment: 3 Vital Signs Height(in): 60 Pulse(bpm): 102 Weight(lbs): 97 Blood Pressure(mmHg): 171/96 Custodio, Caterina [name] ([id number]) [phone].pdf Page 3 of 7 Body Mass Index(BMI): 18.9 Temperature(F): 98.0 Respiratory Rate(breaths/min): 18 [4:Photos:] [N/A:N/A] Left, Anterior Lower Leg N/A N/A Wound Location: Skin [name] ear/Laceration N/A N/A Wounding Event: Trauma, Other N/A N/A Primary Etiology: Anemia, Hypertension, Osteoarthritis N/A  N/A Comorbid History: 05/22/2022 N/A N/A Date Acquired: 3 N/A N/A Weeks of Treatment: Open N/A N/A Wound Status: No N/A N/A Wound Recurrence: 6.9x1.9x0.2 N/A N/A Measurements L x W x D (cm) 10.297 N/A N/A A (cm) : rea 2.059 N/A N/A Volume (cm) : 35.30% N/A N/A % Reduction in A rea: 35.30% N/A N/A % Reduction in Volume: Full Thickness With Exposed Support N/A N/A Classification: Structures Medium N/A N/A Exudate A mount: Serosanguineous N/A N/A Exudate Type: red, brown N/A N/A Exudate Color: Medium (34-66%) N/A N/A Granulation A mount: Red N/A N/A Granulation Quality: Medium (34-66%) N/A N/A Necrotic A mount: Fat Layer (Subcutaneous Tissue): Yes N/A N/A Exposed Structures: Fascia: No Tendon: No Muscle: No Joint: No Bone: No None N/A N/A Epithelialization: Debridement - Selective/Open Wound N/A N/A Debridement: Pre-procedure Verification/Time Out 15:48 N/A N/A Taken: Lidocaine 4% Topical Solution N/A N/A Pain Control: Slough N/A N/A Tissue Debrided: Non-Viable Tissue N/A N/A Level: 10.29 N/A N/A Debridement A (sq cm): rea Curette N/A  N/A Instrument: Minimum N/A N/A Bleeding: Pressure N/A N/A Hemostasis A chieved: Procedure was tolerated well N/A N/A Debridement Treatment Response: 6.9x1.9x0.2 N/A N/A Post Debridement Measurements L x W x D (cm) 2.059 N/A N/A Post Debridement Volume: (cm) Hemosiderin Staining: Yes N/A N/A Periwound Skin Color: Debridement N/A N/A Procedures Performed: Treatment Notes Electronic Signature(s) Signed: 08/20/2022 5:19:21 PM By: Geralyn Corwin DO Entered By: Geralyn Corwin on 08/20/2022 16:03:35 -------------------------------------------------------------------------------- Multi-Disciplinary Care Plan Details Patient Name: Date of Service: [name], Delailah [name]. 08/20/2022 3:00 PM Medical Record Number: [id number] Patient Account Number: [id number] Date of Birth/Sex: Treating RN: 11-06-1928 (89 y.o. Dola Argyle, Carrie Kohen, Aanchal [name] ([id number]) [phone].pdf Page 4 of 7 Primary Care Sammie Schermerhorn: [place], Betty Other Clinician: Referring Noelani Harbach: Treating Trysta Showman/Extender: Hoffman, [name] [place], Betty Weeks in Treatment: 3 Active Inactive Wound/Skin Impairment Nursing Diagnoses: Impaired tissue integrity Goals: Patient/caregiver will verbalize understanding of skin care regimen Date Initiated: 07/30/2022 Target Resolution Date: 09/22/2022 Goal Status: Active Interventions: Assess patient/caregiver ability to obtain necessary supplies Assess patient/caregiver ability to perform ulcer/skin care regimen upon admission and as needed Assess ulceration(s) every visit Provide education on ulcer and skin care Screen for HBO Treatment Activities: Skin care regimen initiated : 07/30/2022 Topical wound management initiated : 07/30/2022 Notes: Electronic Signature(s) Signed: 08/20/2022 5:33:27 PM By: Redmond Pulling RN, [name] Entered By: Redmond Pulling on 08/20/2022  15:39:22 -------------------------------------------------------------------------------- Pain Assessment Details Patient Name: Date of Service: [name] [name] Ngo [name]. 08/20/2022 3:00 PM Medical Record Number: [id number] Patient Account Number: [id number] Date of Birth/Sex: Treating RN: October 16, 1928 (89 y.o. [name] Primary Care Aluel Schwarz: [place], Betty Other Clinician: Referring Jennilee Demarco: Treating Samay Delcarlo/Extender: Hoffman, [name] [place], Betty Weeks in Treatment: 3 Active Problems Location of Pain Severity and Description of Pain Patient Has Paino No Site Locations With Dressing Change: Yes Hargan, Markell [name] ([id number]) [phone].pdf Page 5 of 7 Character of Pain Pain Management and Medication Current Pain Management: Electronic Signature(s) Signed: 08/20/2022 5:33:27 PM By: Redmond Pulling RN, [name] Entered By: Redmond Pulling on 08/20/2022 15:22:30 -------------------------------------------------------------------------------- Patient/Caregiver Education Details Patient Name: Date of Service: [name] Ezzard Standing 7/29/2024andnbsp3:00 PM Medical Record Number: [id number] Patient Account Number: [id number] Date of Birth/Gender: Treating RN: Feb 21, 1928 (89 y.o. [name] Primary Care Physician: [place], Betty Other Clinician: Referring Physician: Treating Physician/Extender: Hoffman, [name] [place], Betty Weeks in Treatment: 3 Education Assessment Education Provided To: Patient Education Topics Provided Wound/Skin Impairment: Methods: Explain/Verbal Responses: State content correctly Electronic Signature(s) Signed: 08/20/2022 5:33:27 PM By: Redmond Pulling RN,  [name] Entered By: Redmond Pulling on 08/20/2022 15:40:34 -------------------------------------------------------------------------------- Wound Assessment Details Patient Name: Date of Service: [name] BABITA, LIEBERT [name]. 08/20/2022 3:00 PM Medical Record Number: [id number] Patient  Account Number: [id number] Date of Birth/Sex: Treating RN: Feb 13, 1928 (89 y.o. [name] Primary Care Murdis Flitton: [place], Betty Other Clinician: Referring Jaylenn Altier: Treating Dayla Gasca/Extender: Hoffman, [name] [place], Betty Weeks in Treatment: 3 Wound Status Wound Number: 4 Primary Etiology: Trauma, Other Wound Location: Left, Anterior Lower Leg Wound Status: Open Wounding Event: Skin Tear/Laceration Comorbid History: Anemia, Hypertension, Osteoarthritis Date Acquired: 05/22/2022 Weeks Of Treatment: 3 Clustered Wound: No Photos Bossard, Lynnell [name] ([id number]) [phone].pdf Page 6 of 7 Wound Measurements Length: (cm) 6.9 Width: (cm) 1.9 Depth: (cm) 0.2 Area: (cm) 10.297 Volume: (cm) 2.059 % Reduction in Area: 35.3% % Reduction in Volume: 35.3% Epithelialization: None Tunneling: No Undermining: No Wound Description Classification: Full Thickness With Exposed Support Structures Exudate Amount: Medium Exudate Type: Serosanguineous Exudate Color: red, brown Foul Odor After Cleansing: No Slough/Fibrino Yes Wound Bed Granulation Amount: Medium (34-66%) Exposed Structure Granulation Quality: Red Fascia Exposed: No Necrotic Amount: Medium (34-66%) Fat Layer (Subcutaneous Tissue) Exposed: Yes Necrotic Quality: Adherent Slough Tendon Exposed: No Muscle Exposed: No Joint Exposed: No Bone Exposed: No Periwound Skin Texture Texture Color No Abnormalities Noted: No No Abnormalities Noted: No Hemosiderin Staining: Yes Moisture No Abnormalities Noted: No Treatment Notes Wound #4 (Lower Leg) Wound Laterality: Left, Anterior Cleanser Soap and Water Discharge Instruction: May shower and wash wound with dial antibacterial soap and water prior to dressing change. Vashe 5.8 (oz) Discharge Instruction: Cleanse the wound with Vashe prior to applying a clean dressing using gauze sponges, not tissue or cotton balls. Peri-Wound Care Sween Lotion  (Moisturizing lotion) Discharge Instruction: Apply moisturizing lotion as directed Topical Gentamicin Discharge Instruction: As directed by physician Mupirocin Ointment Discharge Instruction: Apply Mupirocin (Bactroban) as instructed Santyl Discharge Instruction: apply directly wound bed. Primary Dressing Hydrofera Blue Ready Transfer Foam, 4x5 (in/in) Discharge Instruction: Apply to wound bed as instructed Secondary Dressing ABD Pad, 8x10 Discharge Instruction: Apply over primary dressing as directed. Woven Gauze Sponge, Non-Sterile 4x4 in Discharge Instruction: Apply over primary dressing as directed. Loppnow, Meghna [name] ([id number]) [id number] Page 7 of 7 Secured With Compression Wrap Kerlix Roll 4.5x3.1 (in/yd) Discharge Instruction: Apply Kerlix and Coban compression as directed. Coban Self-Adherent Wrap 4x5 (in/yd) Discharge Instruction: Apply over Kerlix as directed. Compression Stockings Add-Ons Electronic Signature(s) Signed: 08/20/2022 5:33:27 PM By: Redmond Pulling RN, [name] Entered By: Redmond Pulling on 08/20/2022 15:38:16 -------------------------------------------------------------------------------- Vitals Details Patient Name: Date of Service: [name] [name] Ngo [name]. 08/20/2022 3:00 PM Medical Record Number: [id number] Patient Account Number: [id number] Date of Birth/Sex: Treating RN: 1928/06/03 (89 y.o. [name] Primary Care Gladies Sofranko: [place], Betty Other Clinician: Referring Kessler Kopinski: Treating Laroy Mustard/Extender: Hoffman, [name] [place], Betty Weeks in Treatment: 3 Vital Signs Time Taken: 15:22 Temperature (F): 98.0 Height (in): 60 Pulse (bpm): 102 Weight (lbs): 97 Respiratory Rate (breaths/min): 18 Body Mass Index (BMI): 18.9 Blood Pressure (mmHg): 171/96 Reference Range: 80 - 120 mg / dl Electronic Signature(s) Signed: 08/20/2022 5:33:27 PM By: Redmond Pulling RN, [name] Entered By: Redmond Pulling on 08/20/2022 15:22:22

## 2022-08-27 ENCOUNTER — Encounter (HOSPITAL_BASED_OUTPATIENT_CLINIC_OR_DEPARTMENT_OTHER): Payer: Medicare Other | Attending: Internal Medicine | Admitting: Internal Medicine

## 2022-08-27 DIAGNOSIS — Z92241 Personal history of systemic steroid therapy: Secondary | ICD-10-CM | POA: Insufficient documentation

## 2022-08-27 DIAGNOSIS — I878 Other specified disorders of veins: Secondary | ICD-10-CM | POA: Diagnosis not present

## 2022-08-27 DIAGNOSIS — L97822 Non-pressure chronic ulcer of other part of left lower leg with fat layer exposed: Secondary | ICD-10-CM | POA: Diagnosis not present

## 2022-08-27 DIAGNOSIS — M1990 Unspecified osteoarthritis, unspecified site: Secondary | ICD-10-CM | POA: Insufficient documentation

## 2022-08-27 DIAGNOSIS — I10 Essential (primary) hypertension: Secondary | ICD-10-CM | POA: Insufficient documentation

## 2022-08-27 DIAGNOSIS — W2203XA Walked into furniture, initial encounter: Secondary | ICD-10-CM | POA: Insufficient documentation

## 2022-08-27 DIAGNOSIS — Z87891 Personal history of nicotine dependence: Secondary | ICD-10-CM | POA: Diagnosis not present

## 2022-08-27 DIAGNOSIS — I87312 Chronic venous hypertension (idiopathic) with ulcer of left lower extremity: Secondary | ICD-10-CM | POA: Diagnosis not present

## 2022-08-27 DIAGNOSIS — T798XXA Other early complications of trauma, initial encounter: Secondary | ICD-10-CM | POA: Insufficient documentation

## 2022-08-27 DIAGNOSIS — M353 Polymyalgia rheumatica: Secondary | ICD-10-CM | POA: Diagnosis not present

## 2022-09-03 ENCOUNTER — Encounter (HOSPITAL_BASED_OUTPATIENT_CLINIC_OR_DEPARTMENT_OTHER): Payer: Medicare Other | Admitting: Internal Medicine

## 2022-09-03 DIAGNOSIS — M353 Polymyalgia rheumatica: Secondary | ICD-10-CM | POA: Diagnosis not present

## 2022-09-03 DIAGNOSIS — I878 Other specified disorders of veins: Secondary | ICD-10-CM | POA: Diagnosis not present

## 2022-09-03 DIAGNOSIS — Z92241 Personal history of systemic steroid therapy: Secondary | ICD-10-CM | POA: Diagnosis not present

## 2022-09-03 DIAGNOSIS — I87312 Chronic venous hypertension (idiopathic) with ulcer of left lower extremity: Secondary | ICD-10-CM | POA: Diagnosis not present

## 2022-09-03 DIAGNOSIS — L97822 Non-pressure chronic ulcer of other part of left lower leg with fat layer exposed: Secondary | ICD-10-CM

## 2022-09-03 DIAGNOSIS — T798XXA Other early complications of trauma, initial encounter: Secondary | ICD-10-CM | POA: Diagnosis not present

## 2022-09-03 DIAGNOSIS — M1990 Unspecified osteoarthritis, unspecified site: Secondary | ICD-10-CM | POA: Diagnosis not present

## 2022-09-03 DIAGNOSIS — I10 Essential (primary) hypertension: Secondary | ICD-10-CM | POA: Diagnosis not present

## 2022-09-03 DIAGNOSIS — Z87891 Personal history of nicotine dependence: Secondary | ICD-10-CM | POA: Diagnosis not present

## 2022-09-03 NOTE — Progress Notes (Signed)
Grammatico, [name] ([id number]) [id number] Page 1 of 7 Visit Report for 09/03/2022 Arrival Information Details Patient Name: Date of Service: [name]. 09/03/2022 2:00 PM Medical Record Number: [id number] Patient Account Number: [id number] Date of Birth/Sex: Treating RN: [birth year] (89 y.o. F) Primary Care : [place], Betty Other Clinician: Referring : Treating /Extender: Hoffman, [name] [place], Betty Weeks in Treatment: 5 Visit Information History Since Last Visit All ordered tests and consults were completed: No Patient Arrived: [name] Added or deleted any medications: No Arrival Time: 14:11 Any new allergies or adverse reactions: No Accompanied By: daughter Had a fall or experienced change in No Transfer Assistance: None activities of daily living that may affect Patient Identification Verified: Yes risk of falls: Secondary Verification Process Completed: Yes Signs or symptoms of abuse/neglect since last visito No Hospitalized since last visit: No Implantable device outside of the clinic excluding No cellular tissue based products placed in the center since last visit: Pain Present Now: No Electronic Signature(s) Signed: 09/03/2022 3:12:19 PM By: Dayton Scrape Entered By: Dayton Scrape on 09/03/2022 14:11:25 -------------------------------------------------------------------------------- Encounter Discharge Information Details Patient Name: Date of Service: [name]. 09/03/2022 2:00 PM Medical Record Number: [id number] Patient Account Number: [id number] Date of Birth/Sex: Treating RN: 02-11-1928 (89 y.o. Katrinka Blazing Primary Care : [place], Betty Other Clinician: Referring : Treating /Extender: Hoffman, [name] [place], Betty Weeks in Treatment: 5 Encounter Discharge Information Items Post Procedure Vitals Discharge Condition: Stable Temperature (F): 97.9 Ambulatory  Status: Ambulatory Pulse (bpm): 52 Discharge Destination: Home Respiratory Rate (breaths/min): 18 Transportation: Private Auto Blood Pressure (mmHg): 145/65 Accompanied By: daughter Schedule Follow-up Appointment: Yes Clinical Summary of Care: Patient Declined Electronic Signature(s) Signed: 09/03/2022 5:16:00 PM By: Karie Schwalbe RN Entered By: Karie Schwalbe on 09/03/2022 17:14:56 [name] ([id number]) [id number] Page 2 of 7 -------------------------------------------------------------------------------- Lower Extremity Assessment Details Patient Name: Date of Service: [name]. 09/03/2022 2:00 PM Medical Record Number: [id number] Patient Account Number: [id number] Date of Birth/Sex: Treating RN: December 16, 1928 (89 y.o. Katrinka Blazing Primary Care : [place], Betty Other Clinician: Referring : Treating /Extender: Hoffman, [name] [place], Betty Weeks in Treatment: 5 Edema Assessment Assessed: [Left: No] [Right: No] [Left: Edema] [Right: :] Calf Left: Right: Point of Measurement: 27 cm From Medial Instep 28 cm Ankle Left: Right: Point of Measurement: 10 cm From Medial Instep 17.5 cm Vascular Assessment Pulses: Dorsalis Pedis Palpable: [Left:Yes] Extremity colors, hair growth, and conditions: Extremity Color: [Left:Hyperpigmented] Hair Growth on Extremity: [Left:No] Temperature of Extremity: [Left:Warm] Capillary Refill: [Left:< 3 seconds] Dependent Rubor: [Left:No No] Toe Nail Assessment Left: Right: Thick: No Discolored: No Deformed: No Improper Length and Hygiene: No Electronic Signature(s) Signed: 09/03/2022 5:16:00 PM By: Karie Schwalbe RN Entered By: Karie Schwalbe on 09/03/2022 14:58:47 -------------------------------------------------------------------------------- Multi Wound Chart Details Patient Name: Date of Service: [name]. 09/03/2022 2:00 PM Medical Record  Number: [id number] Patient Account Number: [id number] Date of Birth/Sex: Treating RN: 1928-07-02 (89 y.o. F) Primary Care : [place], Betty Other Clinician: Referring : Treating /Extender: Hoffman, [name] [place], Betty Weeks in Treatment: 5 Vital Signs Height(in): 60 Pulse(bpm): 52 Weight(lbs): 97 Blood Pressure(mmHg): 145/65 Body Mass Index(BMI): 18.9 Mandelbaum, [name] ([id number]) [id number] Page 3 of 7 Temperature(F): 97.9 Respiratory Rate(breaths/min): 18 [4:Photos:] [N/A:N/A] Left, Anterior Lower Leg N/A N/A Wound Location: Skin [name] ear/Laceration N/A N/A Wounding Event: Trauma, Other N/A N/A Primary Etiology: Anemia, Hypertension, Osteoarthritis N/A N/A Comorbid History: 05/22/2022 N/A N/A Date Acquired: 5 N/A N/A Weeks of Treatment: Open N/A  N/A Wound Status: No N/A N/A Wound Recurrence: 3.8x1.1x0.1 N/A N/A Measurements L x W x D (cm) 3.283 N/A N/A A (cm) : rea 0.328 N/A N/A Volume (cm) : 79.40% N/A N/A % Reduction in A rea: 89.70% N/A N/A % Reduction in Volume: Full Thickness With Exposed Support N/A N/A Classification: Structures Medium N/A N/A Exudate A mount: Serosanguineous N/A N/A Exudate Type: red, brown N/A N/A Exudate Color: Distinct, outline attached N/A N/A Wound Margin: Medium (34-66%) N/A N/A Granulation A mount: Red N/A N/A Granulation Quality: Medium (34-66%) N/A N/A Necrotic A mount: Fat Layer (Subcutaneous Tissue): Yes N/A N/A Exposed Structures: Fascia: No Tendon: No Muscle: No Joint: No Bone: No None N/A N/A Epithelialization: Debridement - Selective/Open Wound N/A N/A Debridement: Pre-procedure Verification/Time Out 14:53 N/A N/A Taken: Lidocaine 4% Topical Solution N/A N/A Pain Control: Slough N/A N/A Tissue Debrided: Non-Viable Tissue N/A N/A Level: 3.28 N/A N/A Debridement A (sq cm): rea Curette N/A N/A Instrument: Minimum N/A N/A Bleeding: Pressure N/A  N/A Hemostasis A chieved: 0 N/A N/A Procedural Pain: 0 N/A N/A Post Procedural Pain: Procedure was tolerated well N/A N/A Debridement Treatment Response: 3.8x1.1x0.1 N/A N/A Post Debridement Measurements L x W x D (cm) 0.328 N/A N/A Post Debridement Volume: (cm) Excoriation: No N/A N/A Periwound Skin Texture: Induration: No Callus: No Crepitus: No Rash: No Scarring: No Maceration: No N/A N/A Periwound Skin Moisture: Dry/Scaly: No Hemosiderin Staining: Yes N/A N/A Periwound Skin Color: Atrophie Blanche: No Cyanosis: No Ecchymosis: No Erythema: No Mottled: No Pallor: No Rubor: No No Abnormality N/A N/A Temperature: Debridement N/A N/A Procedures Performed: Treatment Notes Electronic Signature(s) Dubow, [name] ([id number]) [id number] Page 4 of 7 Signed: 09/03/2022 4:52:09 PM By: Geralyn Corwin DO Entered By: Geralyn Corwin on 09/03/2022 15:04:30 -------------------------------------------------------------------------------- Multi-Disciplinary Care Plan Details Patient Name: Date of Service: [name]. 09/03/2022 2:00 PM Medical Record Number: [id number] Patient Account Number: [id number] Date of Birth/Sex: Treating RN: 1928/12/02 (89 y.o. Katrinka Blazing Primary Care : [place], Betty Other Clinician: Referring : Treating /Extender: Hoffman, [name] [place], Betty Weeks in Treatment: 5 Active Inactive Wound/Skin Impairment Nursing Diagnoses: Impaired tissue integrity Goals: Patient/caregiver will verbalize understanding of skin care regimen Date Initiated: 07/30/2022 Target Resolution Date: 09/22/2022 Goal Status: Active Interventions: Assess patient/caregiver ability to obtain necessary supplies Assess patient/caregiver ability to perform ulcer/skin care regimen upon admission and as needed Assess ulceration(s) every visit Provide education on ulcer and skin care Screen for  HBO Treatment Activities: Skin care regimen initiated : 07/30/2022 Topical wound management initiated : 07/30/2022 Notes: Electronic Signature(s) Signed: 09/03/2022 5:16:00 PM By: Karie Schwalbe RN Entered By: Karie Schwalbe on 09/03/2022 17:13:11 -------------------------------------------------------------------------------- Pain Assessment Details Patient Name: Date of Service: [name]. 09/03/2022 2:00 PM Medical Record Number: [id number] Patient Account Number: [id number] Date of Birth/Sex: Treating RN: [birth year] (89 y.o. F) Primary Care : [place], Betty Other Clinician: Referring : Treating /Extender: Hoffman, [name] [place], Betty Weeks in Treatment: 5 Active Problems Location of Pain Severity and Description of Pain Patient Has Paino No Site Locations Hannay, [name] ([id number]) [id number] Page 5 of 7 Pain Management and Medication Current Pain Management: Electronic Signature(s) Signed: 09/03/2022 3:12:19 PM By: Dayton Scrape Entered By: Dayton Scrape on 09/03/2022 14:15:24 -------------------------------------------------------------------------------- Patient/Caregiver Education Details Patient Name: Date of Service: [name]. 8/12/2024andnbsp2:00 PM Medical Record Number: [id number] Patient Account Number: [id number] Date of Birth/Gender: Treating RN: 14-Apr-1928 (89 y.o. Katrinka Blazing Primary Care Physician: [place], Betty Other Clinician: Referring Physician: Treating  Physician/Extender: Hoffman, [name] [place], Betty Weeks in Treatment: 5 Education Assessment Education Provided To: Patient Education Topics Provided Wound/Skin Impairment: Methods: Explain/Verbal Responses: State content correctly Electronic Signature(s) Signed: 09/03/2022 5:16:00 PM By: Karie Schwalbe RN Entered By: Karie Schwalbe on 09/03/2022  17:13:24 -------------------------------------------------------------------------------- Wound Assessment Details Patient Name: Date of Service: [name]. 09/03/2022 2:00 PM Medical Record Number: [id number] Patient Account Number: [id number] Date of Birth/Sex: Treating RN: 1928/05/13 (89 y.o. F) Primary Care : [place], Betty Other Clinician: Referring : Treating /Extender: Hoffman, [name] [place], Betty [name], Kristiann [name] ([id number]) [id number] Page 6 of 7 Weeks in Treatment: 5 Wound Status Wound Number: 4 Primary Etiology: Trauma, Other Wound Location: Left, Anterior Lower Leg Wound Status: Open Wounding Event: Skin Tear/Laceration Comorbid History: Anemia, Hypertension, Osteoarthritis Date Acquired: 05/22/2022 Weeks Of Treatment: 5 Clustered Wound: No Photos Wound Measurements Length: (cm) 3.8 Width: (cm) 1.1 Depth: (cm) 0.1 Area: (cm) 3.283 Volume: (cm) 0.328 % Reduction in Area: 79.4% % Reduction in Volume: 89.7% Epithelialization: None Tunneling: No Undermining: No Wound Description Classification: Full Thickness With Exposed Support Structures Wound Margin: Distinct, outline attached Exudate Amount: Medium Exudate Type: Serosanguineous Exudate Color: red, brown Foul Odor After Cleansing: No Slough/Fibrino Yes Wound Bed Granulation Amount: Medium (34-66%) Exposed Structure Granulation Quality: Red Fascia Exposed: No Necrotic Amount: Medium (34-66%) Fat Layer (Subcutaneous Tissue) Exposed: Yes Necrotic Quality: Adherent Slough Tendon Exposed: No Muscle Exposed: No Joint Exposed: No Bone Exposed: No Periwound Skin Texture Texture Color No Abnormalities Noted: No No Abnormalities Noted: No Callus: No Atrophie Blanche: No Crepitus: No Cyanosis: No Excoriation: No Ecchymosis: No Induration: No Erythema: No Rash: No Hemosiderin Staining: Yes Scarring: No Mottled: No Pallor:  No Moisture Rubor: No No Abnormalities Noted: No Dry / Scaly: No Temperature / Pain Maceration: No Temperature: No Abnormality Treatment Notes Wound #4 (Lower Leg) Wound Laterality: Left, Anterior Cleanser Soap and Water Discharge Instruction: May shower and wash wound with dial antibacterial soap and water prior to dressing change. Vashe 5.8 (oz) Discharge Instruction: Cleanse the wound with Vashe prior to applying a clean dressing using gauze sponges, not tissue or cotton balls. Peri-Wound Care Sween Lotion (Moisturizing lotion) Serratore, [name] ([id number]) [id number] Page 7 of 7 Discharge Instruction: Apply moisturizing lotion as directed Topical Gentamicin Discharge Instruction: As directed by physician Mupirocin Ointment Discharge Instruction: Apply Mupirocin (Bactroban) as instructed [name] Discharge Instruction: apply directly wound bed. Primary Dressing Hydrofera Blue Ready Transfer Foam, 4x5 (in/in) Discharge Instruction: Apply to wound bed as instructed Secondary Dressing ABD Pad, 8x10 Discharge Instruction: Apply over primary dressing as directed. Woven Gauze Sponge, Non-Sterile 4x4 in Discharge Instruction: Apply over primary dressing as directed. Secured With Compression Wrap Kerlix Roll 4.5x3.1 (in/yd) Discharge Instruction: Apply Kerlix and Coban compression as directed. Coban Self-Adherent Wrap 4x5 (in/yd) Discharge Instruction: Apply over Kerlix as directed. Compression Stockings Add-Ons Electronic Signature(s) Signed: 09/03/2022 5:16:00 PM By: Karie Schwalbe RN Entered By: Karie Schwalbe on 09/03/2022 14:59:05 -------------------------------------------------------------------------------- Vitals Details Patient Name: Date of Service: [name]. 09/03/2022 2:00 PM Medical Record Number: [id number] Patient Account Number: [id number] Date of Birth/Sex: Treating RN: [birth year] (89 y.o. F) Primary Care  : [place], Betty Other Clinician: Referring : Treating /Extender: Hoffman, [name] [place], Betty Weeks in Treatment: 5 Vital Signs Time Taken: 02:11 Temperature (F): 97.9 Height (in): 60 Pulse (bpm): 52 Weight (lbs): 97 Respiratory Rate (breaths/min): 18 Body Mass Index (BMI): 18.9 Blood Pressure (mmHg): 145/65 Reference Range: 80 - 120 mg / dl Electronic Signature(s) Signed: 09/03/2022 3:12:19 PM  By: Dayton Scrape Entered By: Dayton Scrape on 09/03/2022 14:15:09

## 2022-09-03 NOTE — Progress Notes (Signed)
[name], [name] ([id number]) [id number] Page 1 of 10 Visit Report for 09/03/2022 Chief Complaint Document Details Patient Name: Date of Service: [name] MICHELINE, MCCRUMB [name]. 09/03/2022 2:00 PM Medical Record Number: [id number] Patient Account Number: [id number] Date of Birth/Sex: Treating RN: 09-24-1928 (89 y.o. F) Primary Care Provider: [place], Betty Other Clinician: Referring Provider: Treating Provider/Extender: ,  [place], Betty Weeks in Treatment: 5 Information Obtained from: Patient Chief Complaint 07/30/2022; left lower extremity wound secondary to trauma Electronic Signature(s) Signed: 09/03/2022 4:52:09 PM By: Geralyn Corwin DO Entered By: Geralyn Corwin on 09/03/2022 15:04:38 -------------------------------------------------------------------------------- Debridement Details Patient Name: Date of Service: [name]. 09/03/2022 2:00 PM Medical Record Number: [id number] Patient Account Number: [id number] Date of Birth/Sex: Treating RN: [birth year] (89 y.o. [name] Primary Care Provider: [place], Betty Other Clinician: Referring Provider: Treating Provider/Extender: ,  [place], Betty Weeks in Treatment: 5 Debridement Performed for Assessment: Wound #4 Left,Anterior Lower Leg Performed By: Physician Geralyn Corwin, DO Debridement Type: Debridement Level of Consciousness (Pre-procedure): Awake and Alert Pre-procedure Verification/Time Out Yes - 14:53 Taken: Start Time: 14:53 Pain Control: Lidocaine 4% [name] opical Solution Percent of Wound Bed Debrided: 100% [name] Area Debrided (cm): otal 3.28 Tissue and other material debrided: Non-Viable, Slough, Slough Level: Non-Viable Tissue Debridement Description: Selective/Open Wound Instrument: Curette Bleeding: Minimum Hemostasis Achieved: Pressure End Time: 14:55 Procedural Pain: 0 Post Procedural Pain: 0 Response to Treatment: Procedure was tolerated  well Level of Consciousness (Post- Awake and Alert procedure): Post Debridement Measurements of Total Wound Length: (cm) 3.8 Width: (cm) 1.1 Depth: (cm) 0.1 Volume: (cm) 0.328 Character of Wound/Ulcer Post Debridement: Improved Unangst, [name] ([id number]) [id number] Page 2 of 10 Post Procedure Diagnosis Same as Pre-procedure Notes Scribed for Dr. Mikey Bussing by J.Scotton Electronic Signature(s) Signed: 09/03/2022 4:52:09 PM By: Geralyn Corwin DO Signed: 09/03/2022 5:16:00 PM By: Karie Schwalbe RN Entered By: Karie Schwalbe on 09/03/2022 15:00:56 -------------------------------------------------------------------------------- HPI Details Patient Name: Date of Service: [name], [name]. 09/03/2022 2:00 PM Medical Record Number: [id number] Patient Account Number: [id number] Date of Birth/Sex: Treating RN: Nov 01, 1928 (89 y.o. F) Primary Care Provider: [place], Betty Other Clinician: Referring Provider: Treating Provider/Extender: ,  [place], Betty Weeks in Treatment: 5 History of Present Illness HPI Description: ADMISSION 10/06/2018 This is an 89 year old woman who is here for wounds on her bilateral anterior lower legs. She was previously seen in early 2017 with a wound on her left leg that was traumatic. Felt to have lymphedema and venous insufficiency at that time although she had a reflux study that did not show significant superficial reflux in the left lower extremity. This healed. At some point she has had stockings prescribed but she cannot get the standard stocking on. The patient is also on chronic prednisone I think related to polymyalgia rheumatica currently at 2.5 mg daily. The patient states that in July she slipped out of bed traumatizing the anterior part of her leg. She was seen by her primary care physicians beginning on 08/14/2018. It was noted to have some erythema and swelling she was given Keflex. On 7/30 she had  doxycycline and I think he has had perhaps another round of doxycycline after this. Noted in their notes to have a history of chronic venous insufficiency and some edema. Sometime in August she traumatized her left anterior leg with the tip of her cane and has an open wound in this area as well. She has not been doing anything specific to the wounds themselves leaving them open to air and putting  Band-Aids on top. Past medical history; hypertension, polymyalgia rheumatica on chronic prednisone, history of microdiscectomy, B12 deficiency, vitamin D deficiency, emphysema, osteoporosis ABIs in our clinic were 0.96 on the right and 0.99 on the left. 9/22; 2 small wounds on the left anterior and right anterior tibial area. These are better than last week. We put her in 3 layer compression unfortunately she appears to have a new wound on the medial right lower leg anteriorly probably because of wrap injury. She is on chronic prednisone with a history of chronic polymyalgia rheumatica and has very frail skin on her lower extremities 9/29; 2 small wounds on the left anterior and right anterior tibial areas these continue to improve. The abrasion she had from last week appears to have closed over. 10/6; the patient had 2 wounds on the left anterior and right anterior tibial wounds. The left anterior wound is just about closed. Right anterior tibial is still open. 10/13; the patient's leg is healed on the left as predicted. The area on the right is still open but measuring smaller 10/20; the patient comes in today with a small area on the left medial lower leg. The area on the right is still open but measuring smaller. We had put her in a dual layer stocking last week on the left she is not able to get the 2 layers on she will only wear 1 layer. 10/27; the left medial lower leg is closed. The right one is just about closed. We continue to dress the right leg and compression we put her 11/5; the left medial  leg is closed and she has a juxta lite stocking on although her daughter had to apply it. The right one is closed today and we will put a juxta lite stocking on this leg as well. The patient states most of the skin changes are due to chronic steroid use. [name] the point this is true but she has tightly fibrotic o skin in her distal lower extremities suggesting some degree of chronic venous insufficiency as well. She does not have a lot of edema in her leg 07/30/2022 Ms. Shaundra Rozen is a 89 year old female with a past medical history of essential hypertension, polymyalgia rheumatica on chronic steroids, and venous insufficiency that presents to the clinic for a 9-week history of nonhealing wound to the left lower extremities. She states she hit her leg against a table and developed a hematoma that eventually opened and drained. She has been using hydrogen peroxide to the wound bed. She currently denies signs of infection. She has not been on antibiotics. She has compression stockings at home but has difficulty putting these on. She states she cannot even put on a sock. 7/15; patient presents for follow-up. She has been using Vashe wet-to-dry dressings although technique is questionable. She has left this open to air for several days because she did not think she had the right gauze to use for the wet-to-dry dressing. 7/22; patient presents for follow-up. She has been using Vashe wet-to-dry dressings. More healthy granulation tissue present today. She had ABIs completed that showed an ABI of 1.07 on the left with biphasic waveforms throughout the system. 7/29; patient presents for follow-up. We have been using Santyl and Hydrofera Blue under Kerlix/Coban. She has tolerated this compression wrap well. Wound is smaller. 8/5; patient presents for follow-up. We have been using Santyl and Hydrofera Blue under Kerlix/Coban. The wound is smaller. We discussed potentially doing a skin substitute and patient  was agreeable to have  insurance verification done for this. Muna, Versteeg Fannie [name] ([id number]) [id number] Page 3 of 10 8/12; patient presents for follow-up. We have been doing Hydrofera Blue with Santyl under Kerlix/Coban. Wound is smaller today. Due to out-of-pocket cost patient has declined skin substitute placement. Electronic Signature(s) Signed: 09/03/2022 4:52:09 PM By: Geralyn Corwin DO Entered By: Geralyn Corwin on 09/03/2022 15:06:37 -------------------------------------------------------------------------------- Physical Exam Details Patient Name: Date of Service: [name], [name]. 09/03/2022 2:00 PM Medical Record Number: [id number] Patient Account Number: [id number] Date of Birth/Sex: Treating RN: 1928-04-03 (89 y.o. F) Primary Care Provider: [place], Betty Other Clinician: Referring Provider: Treating Provider/Extender: ,  [place], Betty Weeks in Treatment: 5 Constitutional respirations regular, non-labored and within target range for patient.. Cardiovascular 2+ dorsalis pedis/posterior tibialis pulses. Psychiatric pleasant and cooperative. Notes Left lower extremity: [name] the anterior aspect there is an open wound with granulation tissue and nonviable tissue. No surrounding signs of infection. Venous o stasis dermatitis noted throughout the leg. Electronic Signature(s) Signed: 09/03/2022 4:52:09 PM By: Geralyn Corwin DO Entered By: Geralyn Corwin on 09/03/2022 15:07:11 -------------------------------------------------------------------------------- Physician Orders Details Patient Name: Date of Service: [name]. 09/03/2022 2:00 PM Medical Record Number: [id number] Patient Account Number: [id number] Date of Birth/Sex: Treating RN: Apr 08, 1928 (89 y.o. [name] Primary Care Provider: [place], Betty Other Clinician: Referring Provider: Treating Provider/Extender: ,  [place], Betty Weeks  in Treatment: 5 Verbal / Phone Orders: No Diagnosis Coding Follow-up Appointments ppointment in 1 week. - Dr. Mikey Bussing - room 8 or 9 09/10/22 at 11am Return A Other: - Appt made for 9/9 at 10:15am. Anesthetic (In clinic) Topical Lidocaine 4% applied to wound bed Cellular or Tissue Based Products Other Cellular or Tissue Based Products Orders/Instructions: - Run IVR for Apligraf and Puraply Bathing/ Shower/ Hygiene May shower and wash wound with soap and water. Miriah, Hock Pascha [name] ([id number]) [id number] Page 4 of 10 Edema Control - Lymphedema / SCD / Other Bilateral Lower Extremities Elevate legs to the level of the heart or above for 30 minutes daily and/or when sitting for 3-4 times a day throughout the day. Avoid standing for long periods of time. Wound Treatment Wound #4 - Lower Leg Wound Laterality: Left, Anterior Cleanser: Soap and Water 1 x Per Week/30 Days Discharge Instructions: May shower and wash wound with dial antibacterial soap and water prior to dressing change. Cleanser: Vashe 5.8 (oz) 1 x Per Week/30 Days Discharge Instructions: Cleanse the wound with Vashe prior to applying a clean dressing using gauze sponges, not tissue or cotton balls. Peri-Wound Care: Sween Lotion (Moisturizing lotion) 1 x Per Week/30 Days Discharge Instructions: Apply moisturizing lotion as directed Topical: Gentamicin 1 x Per Week/30 Days Discharge Instructions: As directed by physician Topical: Mupirocin Ointment 1 x Per Week/30 Days Discharge Instructions: Apply Mupirocin (Bactroban) as instructed Topical: Santyl 1 x Per Week/30 Days Discharge Instructions: apply directly wound bed. Prim Dressing: Hydrofera Blue Ready Transfer Foam, 4x5 (in/in) 1 x Per Week/30 Days ary Discharge Instructions: Apply to wound bed as instructed Secondary Dressing: ABD Pad, 8x10 1 x Per Week/30 Days Discharge Instructions: Apply over primary dressing as directed. Secondary  Dressing: Woven Gauze Sponge, Non-Sterile 4x4 in 1 x Per Week/30 Days Discharge Instructions: Apply over primary dressing as directed. Compression Wrap: Kerlix Roll 4.5x3.1 (in/yd) (Generic) 1 x Per Week/30 Days Discharge Instructions: Apply Kerlix and Coban compression as directed. Compression Wrap: Coban Self-Adherent Wrap 4x5 (in/yd) 1 x Per Week/30 Days Discharge Instructions: Apply over Kerlix as directed. Electronic Signature(s) Signed: 09/03/2022 4:52:09 PM  By: Geralyn Corwin DO Entered By: Geralyn Corwin on 09/03/2022 15:07:45 -------------------------------------------------------------------------------- Problem List Details Patient Name: Date of Service: [name]. 09/03/2022 2:00 PM Medical Record Number: [id number] Patient Account Number: [id number] Date of Birth/Sex: Treating RN: Dec 20, 1928 (89 y.o. F) Primary Care Provider: [place], Betty Other Clinician: Referring Provider: Treating Provider/Extender: ,  [place], Betty Weeks in Treatment: 5 Active Problems ICD-10 Encounter Code Description Active Date MDM Diagnosis T79.8XXA Other early complications of trauma, initial encounter 07/30/2022 No Yes L97.822 Non-pressure chronic ulcer of other part of left lower leg with fat layer exposed7/08/2022 No Yes Chappuis, Deundra [name] ([id number]) [id number] Page 5 of 10 [phone] Chronic venous hypertension (idiopathic) with ulcer of left lower extremity 07/30/2022 No Yes M35.3 Polymyalgia rheumatica 07/30/2022 No Yes Z92.241 Personal history of systemic steroid therapy 07/30/2022 No Yes Inactive Problems Resolved Problems Electronic Signature(s) Signed: 09/03/2022 4:52:09 PM By: Geralyn Corwin DO Entered By: Geralyn Corwin on 09/03/2022 15:04:25 -------------------------------------------------------------------------------- Progress Note Details Patient Name: Date of Service: [name], [name]. 09/03/2022 2:00 PM Medical  Record Number: [id number] Patient Account Number: [id number] Date of Birth/Sex: Treating RN: [birth year] (89 y.o. F) Primary Care Provider: [place], Betty Other Clinician: Referring Provider: Treating Provider/Extender: ,  [place], Betty Weeks in Treatment: 5 Subjective Chief Complaint Information obtained from Patient 07/30/2022; left lower extremity wound secondary to trauma History of Present Illness (HPI) ADMISSION 10/06/2018 This is an 89 year old woman who is here for wounds on her bilateral anterior lower legs. She was previously seen in early 2017 with a wound on her left leg that was traumatic. Felt to have lymphedema and venous insufficiency at that time although she had a reflux study that did not show significant superficial reflux in the left lower extremity. This healed. At some point she has had stockings prescribed but she cannot get the standard stocking on. The patient is also on chronic prednisone I think related to polymyalgia rheumatica currently at 2.5 mg daily. The patient states that in July she slipped out of bed traumatizing the anterior part of her leg. She was seen by her primary care physicians beginning on 08/14/2018. It was noted to have some erythema and swelling she was given Keflex. On 7/30 she had doxycycline and I think he has had perhaps another round of doxycycline after this. Noted in their notes to have a history of chronic venous insufficiency and some edema. Sometime in August she traumatized her left anterior leg with the tip of her cane and has an open wound in this area as well. She has not been doing anything specific to the wounds themselves leaving them open to air and putting Band-Aids on top. Past medical history; hypertension, polymyalgia rheumatica on chronic prednisone, history of microdiscectomy, B12 deficiency, vitamin D deficiency, emphysema, osteoporosis ABIs in our clinic were 0.96 on the right and 0.99 on the left. 9/22; 2  small wounds on the left anterior and right anterior tibial area. These are better than last week. We put her in 3 layer compression unfortunately she appears to have a new wound on the medial right lower leg anteriorly probably because of wrap injury. She is on chronic prednisone with a history of chronic polymyalgia rheumatica and has very frail skin on her lower extremities 9/29; 2 small wounds on the left anterior and right anterior tibial areas these continue to improve. The abrasion she had from last week appears to have closed over. 10/6; the patient had 2 wounds on the left anterior and right anterior tibial  wounds. The left anterior wound is just about closed. Right anterior tibial is still open. 10/13; the patient's leg is healed on the left as predicted. The area on the right is still open but measuring smaller 10/20; the patient comes in today with a small area on the left medial lower leg. The area on the right is still open but measuring smaller. We had put her in a dual layer stocking last week on the left she is not able to get the 2 layers on she will only wear 1 layer. 10/27; the left medial lower leg is closed. The right one is just about closed. We continue to dress the right leg and compression we put her 11/5; the left medial leg is closed and she has a juxta lite stocking on although her daughter had to apply it. The right one is closed today and we will put a juxta lite stocking on this leg as well. The patient states most of the skin changes are due to chronic steroid use. [name] the point this is true but she has tightly fibrotic o skin in her distal lower extremities suggesting some degree of chronic venous insufficiency as well. She does not have a lot of edema in her leg 07/30/2022 [name] is a 89 year old female with a past medical history of essential hypertension, polymyalgia rheumatica on chronic steroids, and venous insufficiency that presents to the clinic  for a 9-week history of nonhealing wound to the left lower extremities. She states she hit her leg against a table [place], [name] ([id number]) [phone].pdf Page 6 of 10 and developed a hematoma that eventually opened and drained. She has been using hydrogen peroxide to the wound bed. She currently denies signs of infection. She has not been on antibiotics. She has compression stockings at home but has difficulty putting these on. She states she cannot even put on a sock. 7/15; patient presents for follow-up. She has been using Vashe wet-to-dry dressings although technique is questionable. She has left this open to air for several days because she did not think she had the right gauze to use for the wet-to-dry dressing. 7/22; patient presents for follow-up. She has been using Vashe wet-to-dry dressings. More healthy granulation tissue present today. She had ABIs completed that showed an ABI of 1.07 on the left with biphasic waveforms throughout the system. 7/29; patient presents for follow-up. We have been using Santyl and Hydrofera Blue under Kerlix/Coban. She has tolerated this compression wrap well. Wound is smaller. 8/5; patient presents for follow-up. We have been using Santyl and Hydrofera Blue under Kerlix/Coban. The wound is smaller. We discussed potentially doing a skin substitute and patient was agreeable to have insurance verification done for this. 8/12; patient presents for follow-up. We have been doing Hydrofera Blue with Santyl under Kerlix/Coban. Wound is smaller today. Due to out-of-pocket cost patient has declined skin substitute placement. Patient History Information obtained from Patient. Family History Cancer - Father, Heart Disease - Father, Lung Disease - Father, No family history of Diabetes, Hereditary Spherocytosis, Hypertension, Seizures, Stroke, Thyroid Problems, Tuberculosis. Social History Former smoker, Marital Status - Widowed,  Alcohol Use - Moderate, Drug Use - No History, Caffeine Use - Moderate. Medical History Eyes Denies history of Cataracts, Glaucoma, Optic Neuritis Ear/Nose/Mouth/Throat Denies history of Chronic sinus problems/congestion, Middle ear problems Hematologic/Lymphatic Patient has history of Anemia Denies history of Hemophilia, Human Immunodeficiency Virus, Lymphedema, Sickle Cell Disease Respiratory Denies history of Aspiration, Asthma, Chronic Obstructive Pulmonary Disease (COPD), Pneumothorax, Sleep  Apnea, Tuberculosis Cardiovascular Patient has history of Hypertension Denies history of Angina, Arrhythmia, Congestive Heart Failure, Coronary Artery Disease, Deep Vein Thrombosis, Hypotension, Myocardial Infarction, Peripheral Arterial Disease, Peripheral Venous Disease, Phlebitis, Vasculitis Gastrointestinal Denies history of Cirrhosis , Colitis, Crohns, Hepatitis A, Hepatitis B, Hepatitis C Endocrine Denies history of Type I Diabetes, Type II Diabetes Genitourinary Denies history of End Stage Renal Disease Immunological Denies history of Lupus Erythematosus, Raynauds, Scleroderma Integumentary (Skin) Denies history of History of Burn Musculoskeletal Patient has history of Osteoarthritis Denies history of Gout, Rheumatoid Arthritis, Osteomyelitis Neurologic Denies history of Dementia, Neuropathy, Quadriplegia, Paraplegia, Seizure Disorder Oncologic Denies history of Received Chemotherapy, Received Radiation Psychiatric Denies history of Anorexia/bulimia, Confinement Anxiety Objective Constitutional respirations regular, non-labored and within target range for patient.. Vitals Time Taken: 2:11 AM, Height: 60 in, Weight: 97 lbs, BMI: 18.9, Temperature: 97.9 F, Pulse: 52 bpm, Respiratory Rate: 18 breaths/min, Blood Pressure: 145/65 mmHg. Cardiovascular 2+ dorsalis pedis/posterior tibialis pulses. Psychiatric pleasant and cooperative. General Notes: Left lower extremity: [name] the  anterior aspect there is an open wound with granulation tissue and nonviable tissue. No surrounding signs of o Jech, Norlene [name] ([id number]) [phone].pdf Page 7 of 10 infection. Venous stasis dermatitis noted throughout the leg. Integumentary (Hair, Skin) Wound #4 status is Open. Original cause of wound was Skin [name] ear/Laceration. The date acquired was: 05/22/2022. The wound has been in treatment 5 weeks. The wound is located on the Left,Anterior Lower Leg. The wound measures 3.8cm length x 1.1cm width x 0.1cm depth; 3.283cm^2 area and 0.328cm^3 volume. There is Fat Layer (Subcutaneous Tissue) exposed. There is no tunneling or undermining noted. There is a medium amount of serosanguineous drainage noted. The wound margin is distinct with the outline attached to the wound base. There is medium (34-66%) **red granulation within the wound bed. There is a medium (34-66%) amount of necrotic tissue within the wound bed including Adherent Slough. The periwound skin appearance exhibited: Hemosiderin Staining. The periwound skin appearance did not exhibit: Callus, Crepitus, Excoriation, Induration, Rash, Scarring, Dry/Scaly, Maceration, Atrophie Blanche, Cyanosis, Ecchymosis, Mottled, Pallor, Rubor, Erythema. Periwound temperature was noted as No Abnormality. 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 I debrided nonviable tissue. I recommended continue the course with Hydrofera Blue and Santyl under compression therapy. Follow-up in 1 week. Procedures Wound #4 Pre-procedure diagnosis of Wound #4 is a Trauma, Other located  on the Left,Anterior Lower Leg . There was a Selective/Open Wound Non-Viable Tissue Debridement with a total area of 3.28 sq cm performed by Geralyn Corwin, DO. With the following instrument(s): Curette to remove Non-Viable tissue/material. Material removed includes [facility] after achieving pain control using Lidocaine 4% Topical Solution. No specimens were taken. A time out was conducted at 14:53, prior to the start of the procedure. A Minimum amount of bleeding was controlled with Pressure. The procedure was tolerated well with a pain level of 0 throughout and a pain level of 0 following the procedure. Post Debridement Measurements: 3.8cm length x 1.1cm width x 0.1cm depth; 0.328cm^3 volume. Character of Wound/Ulcer Post Debridement is improved. Post procedure Diagnosis Wound #4: Same as Pre-Procedure General Notes: Scribed for Dr. Mikey Bussing by J.Scotton. Plan Follow-up Appointments: Return Appointment in 1 week. - Dr. Mikey Bussing - room  8 or 9 09/10/22 at 11am Other: - Appt made for 9/9 at 10:15am. Anesthetic: (In clinic) Topical Lidocaine 4% applied to wound bed Cellular or Tissue Based Products: Other Cellular or Tissue Based Products Orders/Instructions: - Run IVR for Apligraf and Puraply Bathing/ Shower/ Hygiene: May shower and wash wound with soap and water. Edema Control - Lymphedema / SCD / Other: Elevate legs to the level of the heart or above for 30 minutes daily and/or when sitting for 3-4 times a day throughout the day. Avoid standing for long periods of time. WOUND #4: - Lower Leg Wound Laterality: Left, Anterior Cleanser: Soap and Water 1 x Per Week/30 Days Discharge Instructions: May shower and wash wound with dial antibacterial soap and water prior to dressing change. Cleanser: Vashe 5.8 (oz) 1 x Per Week/30 Days Discharge Instructions: Cleanse the wound with Vashe prior to applying a clean dressing using gauze sponges, not tissue or cotton balls. Peri-Wound Care: Sween Lotion  (Moisturizing lotion) 1 x Per Week/30 Days Discharge Instructions: Apply moisturizing lotion as directed Topical: Gentamicin 1 x Per Week/30 Days Discharge Instructions: As directed by physician Topical: Mupirocin Ointment 1 x Per Week/30 Days Discharge Instructions: Apply Mupirocin (Bactroban) as instructed Topical: Santyl 1 x Per Week/30 Days Discharge Instructions: apply directly wound bed. Prim Dressing: Hydrofera Blue Ready Transfer Foam, 4x5 (in/in) 1 x Per Week/30 Days ary Discharge Instructions: Apply to wound bed as instructed Secondary Dressing: ABD Pad, 8x10 1 x Per Week/30 Days Discharge Instructions: Apply over primary dressing as directed. Secondary Dressing: Woven Gauze Sponge, Non-Sterile 4x4 in 1 x Per Week/30 Days Discharge Instructions: Apply over primary dressing as directed. Com pression Wrap: Kerlix Roll 4.5x3.1 (in/yd) (Generic) 1 x Per Week/30 Days Discharge Instructions: Apply Kerlix and Coban compression as directed. Com pression Wrap: Coban Self-Adherent Wrap 4x5 (in/yd) 1 x Per Week/30 Days Discharge Instructions: Apply over Kerlix as directed. [place], [place] Pattie [name] ([id number]) [id number] Page 8 of 10 1. In office sharp debridement 2. Santyl and Hydrofera Blue under Kerlix/Cobanleft lower extremity 3. Follow-up in 1 week Electronic Signature(s) Signed: 09/03/2022 4:52:09 PM By: Geralyn Corwin DO Entered By: Geralyn Corwin on 09/03/2022 15:08:26 -------------------------------------------------------------------------------- HxROS Details Patient Name: Date of Service: [name], [name]. 09/03/2022 2:00 PM Medical Record Number: [id number] Patient Account Number: [id number] Date of Birth/Sex: Treating RN: 01/17/1929 (89 y.o. F) Primary Care Provider: [place], Betty Other Clinician: Referring Provider: Treating Provider/Extender: ,  [place], Betty Weeks in Treatment: 5 Information Obtained  From Patient Eyes Medical History: Negative for: Cataracts; Glaucoma; Optic Neuritis Ear/Nose/Mouth/Throat Medical History: Negative for: Chronic sinus problems/congestion; Middle ear problems Hematologic/Lymphatic Medical History: Positive for: Anemia Negative for: Hemophilia; Human Immunodeficiency Virus; Lymphedema; Sickle Cell Disease Respiratory Medical History: Negative for: Aspiration; Asthma; Chronic Obstructive Pulmonary Disease (COPD); Pneumothorax; Sleep Apnea; Tuberculosis Cardiovascular Medical History: Positive for: Hypertension Negative for: Angina; Arrhythmia; Congestive Heart Failure; Coronary Artery Disease; Deep Vein Thrombosis; Hypotension; Myocardial Infarction; Peripheral Arterial Disease; Peripheral Venous Disease; Phlebitis; Vasculitis Gastrointestinal Medical History: Negative for: Cirrhosis ; Colitis; Crohns; Hepatitis A; Hepatitis B; Hepatitis C Endocrine Medical History: Negative for: Type I Diabetes; Type II Diabetes Genitourinary Medical History: Negative for: End Stage Renal Disease Immunological Medical HistoryEMMETT, GOLDWASSER [name] ([id number]) [id number] Page 9 of 10 Negative for: Lupus Erythematosus; Raynauds; Scleroderma Integumentary (Skin) Medical History: Negative for: History of Burn Musculoskeletal Medical History: Positive for: Osteoarthritis Negative for: Gout; Rheumatoid Arthritis; Osteomyelitis Neurologic Medical History: Negative for: Dementia; Neuropathy; Quadriplegia; Paraplegia; Seizure Disorder Oncologic Medical History: Negative for: Received Chemotherapy;  Received Radiation Psychiatric Medical History: Negative for: Anorexia/bulimia; Confinement Anxiety Immunizations Pneumococcal Vaccine: Received Pneumococcal Vaccination: No Implantable Devices None Family and Social History Cancer: Yes - Father; Diabetes: No; Heart Disease: Yes - Father; Hereditary Spherocytosis: No; Hypertension:  No; Lung Disease: Yes - Father; Seizures: No; Stroke: No; Thyroid Problems: No; Tuberculosis: No; Former smoker; Marital Status - Widowed; Alcohol Use: Moderate; Drug Use: No History; Caffeine Use: Moderate; Financial Concerns: No; Food, Clothing or Shelter Needs: No; Support System Lacking: No; Transportation Concerns: No Electronic Signature(s) Signed: 09/03/2022 4:52:09 PM By: Geralyn Corwin DO Entered By: Geralyn Corwin on 09/03/2022 15:06:51 -------------------------------------------------------------------------------- SuperBill Details Patient Name: Date of Service: [name]. 09/03/2022 Medical Record Number: [id number] Patient Account Number: [id number] Date of Birth/Sex: Treating RN: 07-Jun-1928 (89 y.o. F) Primary Care Provider: [place], Betty Other Clinician: Referring Provider: Treating Provider/Extender: ,  [place], Betty Weeks in Treatment: 5 Diagnosis Coding ICD-10 Codes Code Description T79.8XXA Other early complications of trauma, initial encounter L97.822 Non-pressure chronic ulcer of other part of left lower leg with fat layer exposed I87.312 Chronic venous hypertension (idiopathic) with ulcer of left lower extremity M35.3 Polymyalgia rheumatica Z92.241 Personal history of systemic steroid therapy Facility Procedures KEYLEI, FIZER [name] ([id number]): CPT4 Code Description [id number] [phone] - DEBRIDE WOUND 1ST 20 SQ CM OR < ICD-10 Diagnosis Description L97.822 Non-pressure chronic ulcer of other part of left lower leg with I87.312 Chronic venous hypertension (idiopathic)  with ulcer of left lowe [phone].pdf Page 10 of 10: Modifier Quantity 1 fat layer exposed r extremity Physician Procedures : CPT4 Code Description Modifier [id number] - WC PHYS DEBR WO ANESTH 20 SQ CM ICD-10 Diagnosis Description L97.822 Non-pressure chronic ulcer of other part of left lower leg with fat layer exposed I87.312 Chronic venous  hypertension (idiopathic) with  ulcer of left lower extremity Quantity: 1 Electronic Signature(s) Signed: 09/03/2022 4:52:09 PM By: Geralyn Corwin DO Entered By: Geralyn Corwin on 09/03/2022 15:08:39

## 2022-09-10 ENCOUNTER — Encounter (HOSPITAL_BASED_OUTPATIENT_CLINIC_OR_DEPARTMENT_OTHER): Payer: Medicare Other | Admitting: Internal Medicine

## 2022-09-10 DIAGNOSIS — L97822 Non-pressure chronic ulcer of other part of left lower leg with fat layer exposed: Secondary | ICD-10-CM

## 2022-09-10 DIAGNOSIS — T798XXA Other early complications of trauma, initial encounter: Secondary | ICD-10-CM | POA: Diagnosis not present

## 2022-09-10 DIAGNOSIS — I87312 Chronic venous hypertension (idiopathic) with ulcer of left lower extremity: Secondary | ICD-10-CM | POA: Diagnosis not present

## 2022-09-10 DIAGNOSIS — M353 Polymyalgia rheumatica: Secondary | ICD-10-CM | POA: Diagnosis not present

## 2022-09-10 DIAGNOSIS — Z92241 Personal history of systemic steroid therapy: Secondary | ICD-10-CM | POA: Diagnosis not present

## 2022-09-10 DIAGNOSIS — I878 Other specified disorders of veins: Secondary | ICD-10-CM | POA: Diagnosis not present

## 2022-09-10 DIAGNOSIS — M1990 Unspecified osteoarthritis, unspecified site: Secondary | ICD-10-CM | POA: Diagnosis not present

## 2022-09-10 DIAGNOSIS — Z87891 Personal history of nicotine dependence: Secondary | ICD-10-CM | POA: Diagnosis not present

## 2022-09-10 DIAGNOSIS — I10 Essential (primary) hypertension: Secondary | ICD-10-CM | POA: Diagnosis not present

## 2022-09-17 ENCOUNTER — Encounter (HOSPITAL_BASED_OUTPATIENT_CLINIC_OR_DEPARTMENT_OTHER): Payer: Medicare Other | Admitting: Internal Medicine

## 2022-09-17 DIAGNOSIS — L97822 Non-pressure chronic ulcer of other part of left lower leg with fat layer exposed: Secondary | ICD-10-CM | POA: Diagnosis not present

## 2022-09-17 DIAGNOSIS — I10 Essential (primary) hypertension: Secondary | ICD-10-CM | POA: Diagnosis not present

## 2022-09-17 DIAGNOSIS — T798XXA Other early complications of trauma, initial encounter: Secondary | ICD-10-CM | POA: Diagnosis not present

## 2022-09-17 DIAGNOSIS — I87312 Chronic venous hypertension (idiopathic) with ulcer of left lower extremity: Secondary | ICD-10-CM

## 2022-09-17 DIAGNOSIS — I878 Other specified disorders of veins: Secondary | ICD-10-CM | POA: Diagnosis not present

## 2022-09-17 DIAGNOSIS — Z92241 Personal history of systemic steroid therapy: Secondary | ICD-10-CM | POA: Diagnosis not present

## 2022-09-17 DIAGNOSIS — M353 Polymyalgia rheumatica: Secondary | ICD-10-CM | POA: Diagnosis not present

## 2022-09-17 DIAGNOSIS — M1990 Unspecified osteoarthritis, unspecified site: Secondary | ICD-10-CM | POA: Diagnosis not present

## 2022-09-17 DIAGNOSIS — Z87891 Personal history of nicotine dependence: Secondary | ICD-10-CM | POA: Diagnosis not present

## 2022-09-18 NOTE — Progress Notes (Signed)
[name], [name] ([id number]) [id number] Page 1 of 10 Visit Report for 08/27/2022 Chief Complaint Document Details Patient Name: Date of Service: [name] NIKI, VERSER [name]. 08/27/2022 2:00 PM Medical Record Number: [id number] Patient Account Number: [id number] Date of Birth/Sex: Treating RN: 06-30-1928 (89 y.o. F) Primary Care Provider: [place], Betty Other Clinician: Referring Provider: Treating Provider/Extender: Aragorn Recker [place], Betty Weeks in Treatment: 4 Information Obtained from: Patient Chief Complaint 07/30/2022; left lower extremity wound secondary to trauma Electronic Signature(s) Signed: 08/27/2022 4:29:36 PM By: Geralyn Corwin DO Entered By: Geralyn Corwin on 08/27/2022 15:13:27 -------------------------------------------------------------------------------- Debridement Details Patient Name: Date of Service: [name]. 08/27/2022 2:00 PM Medical Record Number: [id number] Patient Account Number: [id number] Date of Birth/Sex: Treating RN: 05/16/1928 (89 y.o. [name] Primary Care Provider: [place], Betty Other Clinician: Referring Provider: Treating Provider/Extender: Iara Monds [place], Betty Weeks in Treatment: 4 Debridement Performed for Assessment: Wound #4 Left,Anterior Lower Leg Performed By: Physician Geralyn Corwin, DO Debridement Type: Debridement Level of Consciousness (Pre-procedure): Awake and Alert Pre-procedure Verification/Time Out Yes - 14:51 Taken: Start Time: 14:52 Pain Control: Lidocaine 4% [name] opical Solution Percent of Wound Bed Debrided: 100% [name] Area Debrided (cm): otal 8.48 Tissue and other material debrided: Non-Viable, Slough, Slough Level: Non-Viable Tissue Debridement Description: Selective/Open Wound Instrument: Curette Bleeding: Minimum Hemostasis Achieved: Pressure End Time: 14:55 Procedural Pain: 0 Post Procedural Pain: 0 Response to Treatment: Procedure was tolerated  well Level of Consciousness (Post- Awake and Alert procedure): Post Debridement Measurements of Total Wound Length: (cm) 6 Width: (cm) 1.8 Depth: (cm) 0.1 Volume: (cm) 0.848 Character of Wound/Ulcer Post Debridement: Improved [name], [name] ([id number]) [id number] Page 2 of 10 Post Procedure Diagnosis Same as Pre-procedure Notes Scribed for Dr Mikey Bussing by Brenton Grills RN. Electronic Signature(s) Signed: 08/27/2022 4:29:36 PM By: Geralyn Corwin DO Signed: 09/18/2022 2:03:55 PM By: Brenton Grills Entered By: Brenton Grills on 08/27/2022 14:53:10 -------------------------------------------------------------------------------- HPI Details Patient Name: Date of Service: [name]. 08/27/2022 2:00 PM Medical Record Number: [id number] Patient Account Number: [id number] Date of Birth/Sex: Treating RN: May 24, 1928 (89 y.o. F) Primary Care Provider: [place], Betty Other Clinician: Referring Provider: Treating Provider/Extender: Laberta Wilbon [place], Betty Weeks in Treatment: 4 History of Present Illness HPI Description: ADMISSION 10/06/2018 This is an 89 year old woman who is here for wounds on her bilateral anterior lower legs. She was previously seen in early 2017 with a wound on her left leg that was traumatic. Felt to have lymphedema and venous insufficiency at that time although she had a reflux study that did not show significant superficial reflux in the left lower extremity. This healed. At some point she has had stockings prescribed but she cannot get the standard stocking on. The patient is also on chronic prednisone I think related to polymyalgia rheumatica currently at 2.5 mg daily. The patient states that in July she slipped out of bed traumatizing the anterior part of her leg. She was seen by her primary care physicians beginning on 08/14/2018. It was noted to have some erythema and swelling she was given Keflex. On 7/30 she had  doxycycline and I think he has had perhaps another round of doxycycline after this. Noted in their notes to have a history of chronic venous insufficiency and some edema. Sometime in August she traumatized her left anterior leg with the tip of her cane and has an open wound in this area as well. She has not been doing anything specific to the wounds themselves leaving them open to air and  putting Band-Aids on top. Past medical history; hypertension, polymyalgia rheumatica on chronic prednisone, history of microdiscectomy, B12 deficiency, vitamin D deficiency, emphysema, osteoporosis ABIs in our clinic were 0.96 on the right and 0.99 on the left. 9/22; 2 small wounds on the left anterior and right anterior tibial area. These are better than last week. We put her in 3 layer compression unfortunately she appears to have a new wound on the medial right lower leg anteriorly probably because of wrap injury. She is on chronic prednisone with a history of chronic polymyalgia rheumatica and has very frail skin on her lower extremities 9/29; 2 small wounds on the left anterior and right anterior tibial areas these continue to improve. The abrasion she had from last week appears to have closed over. 10/6; the patient had 2 wounds on the left anterior and right anterior tibial wounds. The left anterior wound is just about closed. Right anterior tibial is still open. 10/13; the patient's leg is healed on the left as predicted. The area on the right is still open but measuring smaller 10/20; the patient comes in today with a small area on the left medial lower leg. The area on the right is still open but measuring smaller. We had put her in a dual layer stocking last week on the left she is not able to get the 2 layers on she will only wear 1 layer. 10/27; the left medial lower leg is closed. The right one is just about closed. We continue to dress the right leg and compression we put her 11/5; the left medial  leg is closed and she has a juxta lite stocking on although her daughter had to apply it. The right one is closed today and we will put a juxta lite stocking on this leg as well. The patient states most of the skin changes are due to chronic steroid use. [name] the point this is true but she has tightly fibrotic o skin in her distal lower extremities suggesting some degree of chronic venous insufficiency as well. She does not have a lot of edema in her leg 07/30/2022 [name] is a 89 year old female with a past medical history of essential hypertension, polymyalgia rheumatica on chronic steroids, and venous insufficiency that presents to the clinic for a 9-week history of nonhealing wound to the left lower extremities. She states she hit her leg against a table and developed a hematoma that eventually opened and drained. She has been using hydrogen peroxide to the wound bed. She currently denies signs of infection. She has not been on antibiotics. She has compression stockings at home but has difficulty putting these on. She states she cannot even put on a sock. 7/15; patient presents for follow-up. She has been using Vashe wet-to-dry dressings although technique is questionable. She has left this open to air for several days because she did not think she had the right gauze to use for the wet-to-dry dressing. 7/22; patient presents for follow-up. She has been using Vashe wet-to-dry dressings. More healthy granulation tissue present today. She had ABIs completed that showed an ABI of 1.07 on the left with biphasic waveforms throughout the system. 7/29; patient presents for follow-up. We have been using Santyl and Hydrofera Blue under Kerlix/Coban. She has tolerated this compression wrap well. Wound is smaller. 8/5; patient presents for follow-up. We have been using Santyl and Hydrofera Blue under Kerlix/Coban. The wound is smaller. We discussed potentially doing a skin substitute and patient  was agreeable to  have insurance verification done for this. Hak[place], Lindle Chianne [name] ([id number]) [id number] Page 3 of 10 Electronic Signature(s) Signed: 08/27/2022 4:29:36 PM By: Geralyn Corwin DO Entered By: Geralyn Corwin on 08/27/2022 15:14:25 -------------------------------------------------------------------------------- Physical Exam Details Patient Name: Date of Service: [name]. 08/27/2022 2:00 PM Medical Record Number: [id number] Patient Account Number: [id number] Date of Birth/Sex: Treating RN: 07-Aug-1928 (89 y.o. F) Primary Care Provider: [place], Betty Other Clinician: Referring Provider: Treating Provider/Extender: Lara Palinkas [place], Betty Weeks in Treatment: 4 Constitutional respirations regular, non-labored and within target range for patient.. Cardiovascular 2+ dorsalis pedis/posterior tibialis pulses. Psychiatric pleasant and cooperative. Notes Left lower extremity: [name] the anterior aspect there is an open wound with granulation tissue and nonviable tissue. No surrounding signs of infection. Venous o stasis dermatitis noted throughout the leg. Electronic Signature(s) Signed: 08/27/2022 4:29:36 PM By: Geralyn Corwin DO Entered By: Geralyn Corwin on 08/27/2022 15:14:49 -------------------------------------------------------------------------------- Physician Orders Details Patient Name: Date of Service: [name]. 08/27/2022 2:00 PM Medical Record Number: [id number] Patient Account Number: [id number] Date of Birth/Sex: Treating RN: May 13, 1928 (89 y.o. [name] Primary Care Provider: [place], Betty Other Clinician: Referring Provider: Treating Provider/Extender: Rajvir Ernster [place], Betty Weeks in Treatment: 4 Verbal / Phone Orders: No Diagnosis Coding Follow-up Appointments ppointment in 1 week. - Dr. Mikey Bussing - room 9 Return A ppointment in 2 weeks. - Dr. Mikey Bussing room 9 Return A Other: -  Appt made for 9/9 at 10:15am. Anesthetic (In clinic) Topical Lidocaine 4% applied to wound bed Cellular or Tissue Based Products Other Cellular or Tissue Based Products Orders/Instructions: - Run IVR for Apligraf and Puraply Bathing/ Shower/ Hygiene May shower and wash wound with soap and water. Edema Control - Lymphedema / SCD / Other Bilateral Lower Extremities Elevate legs to the level of the heart or above for 30 minutes daily and/or when sitting for 3-4 times a day throughout the day. Meoshia, Hawken Alannah [name] ([id number]) [id number] Page 4 of 10 Avoid standing for long periods of time. Wound Treatment Wound #4 - Lower Leg Wound Laterality: Left, Anterior Cleanser: Soap and Water 1 x Per Week/30 Days Discharge Instructions: May shower and wash wound with dial antibacterial soap and water prior to dressing change. Cleanser: Vashe 5.8 (oz) 1 x Per Week/30 Days Discharge Instructions: Cleanse the wound with Vashe prior to applying a clean dressing using gauze sponges, not tissue or cotton balls. Peri-Wound Care: Sween Lotion (Moisturizing lotion) 1 x Per Week/30 Days Discharge Instructions: Apply moisturizing lotion as directed Topical: Gentamicin 1 x Per Week/30 Days Discharge Instructions: As directed by physician Topical: Mupirocin Ointment 1 x Per Week/30 Days Discharge Instructions: Apply Mupirocin (Bactroban) as instructed Topical: Santyl 1 x Per Week/30 Days Discharge Instructions: apply directly wound bed. Prim Dressing: Hydrofera Blue Ready Transfer Foam, 4x5 (in/in) 1 x Per Week/30 Days ary Discharge Instructions: Apply to wound bed as instructed Secondary Dressing: ABD Pad, 8x10 1 x Per Week/30 Days Discharge Instructions: Apply over primary dressing as directed. Secondary Dressing: Woven Gauze Sponge, Non-Sterile 4x4 in 1 x Per Week/30 Days Discharge Instructions: Apply over primary dressing as directed. Compression Wrap: Kerlix Roll 4.5x3.1  (in/yd) (Generic) 1 x Per Week/30 Days Discharge Instructions: Apply Kerlix and Coban compression as directed. Compression Wrap: Coban Self-Adherent Wrap 4x5 (in/yd) 1 x Per Week/30 Days Discharge Instructions: Apply over Kerlix as directed. Electronic Signature(s) Signed: 08/27/2022 4:29:36 PM By: Geralyn Corwin DO Entered By: Geralyn Corwin on 08/27/2022 15:15:09 -------------------------------------------------------------------------------- Problem List Details Patient Name: Date of Service: [name]  N, Sokha [name]. 08/27/2022 2:00 PM Medical Record Number: [id number] Patient Account Number: [id number] Date of Birth/Sex: Treating RN: 06-14-1928 (89 y.o. [name] Primary Care Provider: [place], Betty Other Clinician: Referring Provider: Treating Provider/Extender: Pardeep Pautz [place], Betty Weeks in Treatment: 4 Active Problems ICD-10 Encounter Code Description Active Date MDM Diagnosis T79.8XXA Other early complications of trauma, initial encounter 07/30/2022 No Yes L97.822 Non-pressure chronic ulcer of other part of left lower leg with fat layer exposed7/08/2022 No Yes I87.312 Chronic venous hypertension (idiopathic) with ulcer of left lower extremity 07/30/2022 No Yes Saephanh, Cherika [name] ([id number]) [id number] Page 5 of 10 M35.3 Polymyalgia rheumatica 07/30/2022 No Yes Z92.241 Personal history of systemic steroid therapy 07/30/2022 No Yes Inactive Problems Resolved Problems Electronic Signature(s) Signed: 08/27/2022 4:29:36 PM By: Geralyn Corwin DO Entered By: Geralyn Corwin on 08/27/2022 15:13:13 -------------------------------------------------------------------------------- Progress Note Details Patient Name: Date of Service: [name]. 08/27/2022 2:00 PM Medical Record Number: [id number] Patient Account Number: [id number] Date of Birth/Sex: Treating RN: 1928/04/23 (89 y.o. F) Primary Care Provider: [place], Betty Other  Clinician: Referring Provider: Treating Provider/Extender: Sherre Wooton [place], Betty Weeks in Treatment: 4 Subjective Chief Complaint Information obtained from Patient 07/30/2022; left lower extremity wound secondary to trauma History of Present Illness (HPI) ADMISSION 10/06/2018 This is an 89 year old woman who is here for wounds on her bilateral anterior lower legs. She was previously seen in early 2017 with a wound on her left leg that was traumatic. Felt to have lymphedema and venous insufficiency at that time although she had a reflux study that did not show significant superficial reflux in the left lower extremity. This healed. At some point she has had stockings prescribed but she cannot get the standard stocking on. The patient is also on chronic prednisone I think related to polymyalgia rheumatica currently at 2.5 mg daily. The patient states that in July she slipped out of bed traumatizing the anterior part of her leg. She was seen by her primary care physicians beginning on 08/14/2018. It was noted to have some erythema and swelling she was given Keflex. On 7/30 she had doxycycline and I think he has had perhaps another round of doxycycline after this. Noted in their notes to have a history of chronic venous insufficiency and some edema. Sometime in August she traumatized her left anterior leg with the tip of her cane and has an open wound in this area as well. She has not been doing anything specific to the wounds themselves leaving them open to air and putting Band-Aids on top. Past medical history; hypertension, polymyalgia rheumatica on chronic prednisone, history of microdiscectomy, B12 deficiency, vitamin D deficiency, emphysema, osteoporosis ABIs in our clinic were 0.96 on the right and 0.99 on the left. 9/22; 2 small wounds on the left anterior and right anterior tibial area. These are better than last week. We put her in 3 layer compression unfortunately she appears to  have a new wound on the medial right lower leg anteriorly probably because of wrap injury. She is on chronic prednisone with a history of chronic polymyalgia rheumatica and has very frail skin on her lower extremities 9/29; 2 small wounds on the left anterior and right anterior tibial areas these continue to improve. The abrasion she had from last week appears to have closed over. 10/6; the patient had 2 wounds on the left anterior and right anterior tibial wounds. The left anterior wound is just about closed. Right anterior tibial is still open. 10/13; the patient's leg is  healed on the left as predicted. The area on the right is still open but measuring smaller 10/20; the patient comes in today with a small area on the left medial lower leg. The area on the right is still open but measuring smaller. We had put her in a dual layer stocking last week on the left she is not able to get the 2 layers on she will only wear 1 layer. 10/27; the left medial lower leg is closed. The right one is just about closed. We continue to dress the right leg and compression we put her 11/5; the left medial leg is closed and she has a juxta lite stocking on although her daughter had to apply it. The right one is closed today and we will put a juxta lite stocking on this leg as well. The patient states most of the skin changes are due to chronic steroid use. [name] the point this is true but she has tightly fibrotic o skin in her distal lower extremities suggesting some degree of chronic venous insufficiency as well. She does not have a lot of edema in her leg 07/30/2022 [name] is a 89 year old female with a past medical history of essential hypertension, polymyalgia rheumatica on chronic steroids, and venous insufficiency that presents to the clinic for a 9-week history of nonhealing wound to the left lower extremities. She states she hit her leg against a table and developed a hematoma that eventually opened  and drained. She has been using hydrogen peroxide to the wound bed. She currently denies signs of infection. She has not been on antibiotics. She has compression stockings at home but has difficulty putting these on. She states she cannot even put on a sock. Cymantha, [name] ([id number]) [id number] Page 6 of 10 7/15; patient presents for follow-up. She has been using Vashe wet-to-dry dressings although technique is questionable. She has left this open to air for several days because she did not think she had the right gauze to use for the wet-to-dry dressing. 7/22; patient presents for follow-up. She has been using Vashe wet-to-dry dressings. More healthy granulation tissue present today. She had ABIs completed that showed an ABI of 1.07 on the left with biphasic waveforms throughout the system. 7/29; patient presents for follow-up. We have been using Santyl and Hydrofera Blue under Kerlix/Coban. She has tolerated this compression wrap well. Wound is smaller. 8/5; patient presents for follow-up. We have been using Santyl and Hydrofera Blue under Kerlix/Coban. The wound is smaller. We discussed potentially doing a skin substitute and patient was agreeable to have insurance verification done for this. Patient History Information obtained from Patient. Family History Cancer - Father, Heart Disease - Father, Lung Disease - Father, No family history of Diabetes, Hereditary Spherocytosis, Hypertension, Seizures, Stroke, Thyroid Problems, Tuberculosis. Social History Former smoker, Marital Status - Widowed, Alcohol Use - Moderate, Drug Use - No History, Caffeine Use - Moderate. Medical History Eyes Denies history of Cataracts, Glaucoma, Optic Neuritis Ear/Nose/Mouth/Throat Denies history of Chronic sinus problems/congestion, Middle ear problems Hematologic/Lymphatic Patient has history of Anemia Denies history of Hemophilia, Human Immunodeficiency Virus, Lymphedema,  Sickle Cell Disease Respiratory Denies history of Aspiration, Asthma, Chronic Obstructive Pulmonary Disease (COPD), Pneumothorax, Sleep Apnea, Tuberculosis Cardiovascular Patient has history of Hypertension Denies history of Angina, Arrhythmia, Congestive Heart Failure, Coronary Artery Disease, Deep Vein Thrombosis, Hypotension, Myocardial Infarction, Peripheral Arterial Disease, Peripheral Venous Disease, Phlebitis, Vasculitis Gastrointestinal Denies history of Cirrhosis , Colitis, Crohns, Hepatitis A, Hepatitis B, Hepatitis C Endocrine Denies  history of Type I Diabetes, Type II Diabetes Genitourinary Denies history of End Stage Renal Disease Immunological Denies history of Lupus Erythematosus, Raynauds, Scleroderma Integumentary (Skin) Denies history of History of Burn Musculoskeletal Patient has history of Osteoarthritis Denies history of Gout, Rheumatoid Arthritis, Osteomyelitis Neurologic Denies history of Dementia, Neuropathy, Quadriplegia, Paraplegia, Seizure Disorder Oncologic Denies history of Received Chemotherapy, Received Radiation Psychiatric Denies history of Anorexia/bulimia, Confinement Anxiety Objective Constitutional respirations regular, non-labored and within target range for patient.. Vitals Time Taken: 2:24 PM, Height: 60 in, Weight: 97 lbs, BMI: 18.9, Temperature: 98.1 F, Pulse: 67 bpm, Respiratory Rate: 18 breaths/min, Blood Pressure: 171/76 mmHg. Cardiovascular 2+ dorsalis pedis/posterior tibialis pulses. Psychiatric pleasant and cooperative. General Notes: Left lower extremity: [name] the anterior aspect there is an open wound with granulation tissue and nonviable tissue. No surrounding signs of o infection. Venous stasis dermatitis noted throughout the leg. Integumentary (Hair, Skin) Wound #4 status is Open. Original cause of wound was Skin [name] ear/Laceration. The date acquired was: 05/22/2022. The wound has been in treatment 4 weeks. The wound is located on  the Left,Anterior Lower Leg. The wound measures 6cm length x 1.8cm width x 0.1cm depth; 8.482cm^2 area and 0.848cm^3 volume. There is Fat Layer (Subcutaneous Tissue) exposed. There is no tunneling or undermining noted. There is a medium amount of serosanguineous drainage noted. There Dragovich, Ladasia [name] ([id number]) [phone].pdf Page 7 of 10 is medium (34-66%) **red granulation within the wound bed. There is a medium (34-66%) amount of necrotic tissue within the wound bed including Adherent Slough. The periwound skin appearance exhibited: Hemosiderin Staining. The periwound skin appearance did not exhibit: Callus, Crepitus, Excoriation, Induration, Rash, Scarring, Dry/Scaly, Maceration, Atrophie Blanche, Cyanosis, Ecchymosis, Mottled, Pallor, Rubor, Erythema. Periwound temperature was noted as No Abnormality. 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 I debrided nonviable tissue. I recommended continuing the course with Santyl and Hydrofera Blue under Kerlix/Coban to the left lower extremity. She would benefit from a skin substitute and we will run insurance verification for organogenesis products. Follow-up in 1 week. Procedures Wound #4 Pre-procedure diagnosis of Wound #4 is a Trauma, Other located on the Left,Anterior Lower Leg . There was a Selective/Open Wound Non-Viable Tissue Debridement with a total area of 8.48 sq cm performed by Geralyn Corwin, DO. With the following instrument(s): Curette to remove Non-Viable tissue/material. Material removed includes [facility] after achieving pain control using Lidocaine 4%  Topical Solution. No specimens were taken. A time out was conducted at 14:51, prior to the start of the procedure. A Minimum amount of bleeding was controlled with Pressure. The procedure was tolerated well with a pain level of 0 throughout and a pain level of 0 following the procedure. Post Debridement Measurements: 6cm length x 1.8cm width x 0.1cm depth; 0.848cm^3 volume. Character of Wound/Ulcer Post Debridement is improved. Post procedure Diagnosis Wound #4: Same as Pre-Procedure General Notes: Scribed for Dr Mikey Bussing by Brenton Grills RN.Marland Kitchen Plan Follow-up Appointments: Return Appointment in 1 week. - Dr. Mikey Bussing - room 9 Return Appointment in 2 weeks. - Dr. Mikey Bussing room 9 Other: - Appt made for 9/9 at 10:15am. Anesthetic: (In clinic) Topical Lidocaine 4% applied to wound bed Cellular or Tissue Based Products: Other Cellular or Tissue Based Products  Orders/Instructions: - Run IVR for Aplig[employer] and Puraply Bathing/ Shower/ Hygiene: May shower and wash wound with soap and water. Edema Control - Lymphedema / SCD / Other: Elevate legs to the level of the heart or above for 30 minutes daily and/or when sitting for 3-4 times a day throughout the day. Avoid standing for long periods of time. WOUND #4: - Lower Leg Wound Laterality: Left, Anterior Cleanser: Soap and Water 1 x Per Week/30 Days Discharge Instructions: May shower and wash wound with dial antibacterial soap and water prior to dressing change. Cleanser: Vashe 5.8 (oz) 1 x Per Week/30 Days Discharge Instructions: Cleanse the wound with Vashe prior to applying a clean dressing using gauze sponges, not tissue or cotton balls. Peri-Wound Care: Sween Lotion (Moisturizing lotion) 1 x Per Week/30 Days Discharge Instructions: Apply moisturizing lotion as directed Topical: Gentamicin 1 x Per Week/30 Days Discharge Instructions: As directed by physician Topical: Mupirocin Ointment 1 x Per Week/30 Days Discharge Instructions: Apply Mupirocin  (Bactroban) as instructed Topical: Santyl 1 x Per Week/30 Days Discharge Instructions: apply directly wound bed. Prim Dressing: Hydrofera Blue Ready Transfer Foam, 4x5 (in/in) 1 x Per Week/30 Days ary Discharge Instructions: Apply to wound bed as instructed Secondary Dressing: ABD Pad, 8x10 1 x Per Week/30 Days Discharge Instructions: Apply over primary dressing as directed. Secondary Dressing: Woven Gauze Sponge, Non-Sterile 4x4 in 1 x Per Week/30 Days Discharge Instructions: Apply over primary dressing as directed. Com pression Wrap: Kerlix Roll 4.5x3.1 (in/yd) (Generic) 1 x Per Week/30 Days Discharge Instructions: Apply Kerlix and Coban compression as directed. Com pression Wrap: Coban Self-Adherent Wrap 4x5 (in/yd) 1 x Per Week/30 Days Discharge Instructions: Apply over Kerlix as directed. 1. In office sharp debridement Holdman, Jatara [name] ([id number]) [phone].pdf Page 8 of 10 2. Hydrofera Blue and Santyl under Kerlix/Cobanleft lower extremity 3. Follow-up in 1 week 4. Run insurance verification for organ[employer] skin substitute products Electronic Signature(s) Signed: 08/27/2022 4:29:36 PM By: Geralyn Corwin DO Entered By: Geralyn Corwin on 08/27/2022 15:16:09 -------------------------------------------------------------------------------- HxROS Details Patient Name: Date of Service: [name], [name]. 08/27/2022 2:00 PM Medical Record Number: [id number] Patient Account Number: [id number] Date of Birth/Sex: Treating RN: 10-29-1928 (89 y.o. F) Primary Care Provider: [place], Betty Other Clinician: Referring Provider: Treating Provider/Extender: Palestine Mosco [place], Betty Weeks in Treatment: 4 Information Obtained From Patient Eyes Medical History: Negative for: Cataracts; Glaucoma; Optic Neuritis Ear/Nose/Mouth/Throat Medical History: Negative for: Chronic sinus problems/congestion; Middle ear problems Hematologic/Lymphatic Medical  History: Positive for: Anemia Negative for: Hemophilia; Human Immunodeficiency Virus; Lymphedema; Sickle Cell Disease Respiratory Medical History: Negative for: Aspiration; Asthma; Chronic Obstructive Pulmonary Disease (COPD); Pneumothorax; Sleep Apnea; Tuberculosis Cardiovascular Medical History: Positive for: Hypertension Negative for: Angina; Arrhythmia; Congestive Heart Failure; Coronary Artery Disease; Deep Vein Thrombosis; Hypotension; Myocardial Infarction; Peripheral Arterial Disease; Peripheral Venous Disease; Phlebitis; Vasculitis Gastrointestinal Medical History: Negative for: Cirrhosis ; Colitis; Crohns; Hepatitis A; Hepatitis B; Hepatitis C Endocrine Medical History: Negative for: Type I Diabetes; Type II Diabetes Genitourinary Medical History: Negative for: End Stage Renal Disease Immunological Medical History: Negative for: Lupus Erythematosus; Raynauds; Scleroderma Abts, [name] ([id number]) [id number] Page 9 of 10 Integumentary (Skin) Medical History: Negative for: History of Burn Musculoskeletal Medical History: Positive for: Osteoarthritis Negative for: Gout; Rheumatoid Arthritis; Osteomyelitis Neurologic Medical History: Negative for: Dementia; Neuropathy; Quadriplegia; Paraplegia; Seizure Disorder Oncologic Medical History: Negative for: Received Chemotherapy; Received Radiation Psychiatric Medical History: Negative for: Anorexia/bulimia; Confinement Anxiety Immunizations Pneumococcal Vaccine: Received Pneumococcal Vaccination: No Implantable Devices None Family and Social History Cancer: Yes -  Father; Diabetes: No; Heart Disease: Yes - Father; Hereditary Spherocytosis: No; Hypertension: No; Lung Disease: Yes - Father; Seizures: No; Stroke: No; Thyroid Problems: No; Tuberculosis: No; Former smoker; Marital Status - Widowed; Alcohol Use: Moderate; Drug Use: No History; Caffeine Use: Moderate; Financial Concerns:  No; Food, Clothing or Shelter Needs: No; Support System Lacking: No; Transportation Concerns: No Electronic Signature(s) Signed: 08/27/2022 4:29:36 PM By: Geralyn Corwin DO Entered By: Geralyn Corwin on 08/27/2022 15:14:32 -------------------------------------------------------------------------------- SuperBill Details Patient Name: Date of Service: [name]. 08/27/2022 Medical Record Number: [id number] Patient Account Number: [id number] Date of Birth/Sex: Treating RN: May 12, 1928 (89 y.o. [name] Primary Care Provider: [place], Betty Other Clinician: Referring Provider: Treating Provider/Extender: Mills Mitton [place], Betty Weeks in Treatment: 4 Diagnosis Coding ICD-10 Codes Code Description T79.8XXA Other early complications of trauma, initial encounter L97.822 Non-pressure chronic ulcer of other part of left lower leg with fat layer exposed I87.312 Chronic venous hypertension (idiopathic) with ulcer of left lower extremity M35.3 Polymyalgia rheumatica Z92.241 Personal history of systemic steroid therapy Facility Procedures : EDMONDSO CPT4 Code: [id number] N, [name] ([id number] ICD-10 Description: 97597 - DEBRIDE WOUND 1ST 20 SQ CM OR < [id number]) [phone] Diagnosis Description L97.822 Non-pressure chronic ulcer of other part of left lower leg with fat layer expos I87.312 Chronic venous hypertension (idiopathic) with ulcer of left  lower extremity Modifier: [id number] ed Quantity: 1 27.pdf Page 10 of 10 Physician Procedures : CPT4 Code Description Modifier [id number] [phone] - WC PHYS DEBR WO ANESTH 20 SQ CM ICD-10 Diagnosis Description L97.822 Non-pressure chronic ulcer of other part of left lower leg with fat layer exposed I87.312 Chronic venous hypertension (idiopathic) with  ulcer of left lower extremity Quantity: 1 Electronic Signature(s) Signed: 08/27/2022 4:29:36 PM By: Geralyn Corwin DO Entered By: Geralyn Corwin on 08/27/2022 15:17:16

## 2022-09-18 NOTE — Progress Notes (Signed)
[name], [name] ([id number]) [id number] Page 1 of 7 Visit Report for 09/10/2022 Arrival Information Details Patient Name: Date of Service: [name] DEEM, [name] [name]. 09/10/2022 11:00 A M Medical Record Number: [id number] Patient Account Number: [id number] Date of Birth/Sex: Treating RN: 1928/03/04 (89 y.o. F) Primary Care Shelina Luo: [name], [name] Other Clinician: Referring Lalaine Overstreet: Treating [name]/Extender: Hoffman, [name] [name], [name] Weeks in Treatment: 6 Visit Information History Since Last Visit Added or deleted any medications: No Patient Arrived: [name] Any new allergies or adverse reactions: No Arrival Time: 11:32 Had a fall or experienced change in No Accompanied By: Daughter activities of daily living that may affect Transfer Assistance: None risk of falls: Patient Identification Verified: Yes Signs or symptoms of abuse/neglect since last visito No Secondary Verification Process Completed: Yes Hospitalized since last visit: No Implantable device outside of the clinic excluding No cellular tissue based products placed in the center since last visit: Has Dressing in Place as Prescribed: Yes Has Compression in Place as Prescribed: Yes Pain Present Now: No Electronic Signature(s) Signed: 09/13/2022 5:02:38 PM By: Thayer Dallas Entered By: Thayer Dallas on 09/10/2022 11:33:06 -------------------------------------------------------------------------------- Encounter Discharge Information Details Patient Name: Date of Service: [name] [name] [name]. 09/10/2022 11:00 A M Medical Record Number: [id number] Patient Account Number: [id number] Date of Birth/Sex: Treating RN: 06-23-1928 (89 y.o. Gevena Mart Primary Care Alphonsus Doyel: [name], [name] Other Clinician: Referring Willodene Stallings: Treating Noreta Kue/Extender: Hoffman, [name] [name], [name] Weeks in Treatment: 6 Encounter Discharge Information Items Post Procedure Vitals Discharge Condition:  Stable Temperature (F): 98.7 Ambulatory Status: Ambulatory Pulse (bpm): 74 Discharge Destination: Home Respiratory Rate (breaths/min): 18 Transportation: Private Auto Blood Pressure (mmHg): 122/79 Accompanied By: daughter Schedule Follow-up Appointment: Yes Clinical Summary of Care: Patient Declined Electronic Signature(s) Signed: 09/18/2022 2:03:55 PM By: Brenton Grills Entered By: Brenton Grills on 09/10/2022 11:58:06 [name], [name] [name] ([id number]) [id number] Page 2 of 7 -------------------------------------------------------------------------------- Lower Extremity Assessment Details Patient Name: Date of Service: [name] AQUILAH, CUNDARI [name]. 09/10/2022 11:00 A M Medical Record Number: [id number] Patient Account Number: [id number] Date of Birth/Sex: Treating RN: 1928/06/19 (89 y.o. F) Primary Care Shondale Quinley: [name], [name] Other Clinician: Referring Brailynn Breth: Treating [name]/Extender: Hoffman, [name] [name], [name] Weeks in Treatment: 6 Edema Assessment Assessed: [Left: No] [Right: No] [Left: Edema] [Right: :] Calf Left: Right: Point of Measurement: 27 cm From Medial Instep 27.5 cm Ankle Left: Right: Point of Measurement: 10 cm From Medial Instep 17.7 cm Vascular Assessment Extremity colors, hair growth, and conditions: Extremity Color: [Left:Hyperpigmented] Hair Growth on Extremity: [Left:No] Temperature of Extremity: [Left:Warm] Capillary Refill: [Left:< 3 seconds] Dependent Rubor: [Left:No No] Electronic Signature(s) Signed: 09/13/2022 5:02:38 PM By: Thayer Dallas Entered By: Thayer Dallas on 09/10/2022 11:32:33 -------------------------------------------------------------------------------- Multi Wound Chart Details Patient Name: Date of Service: [name] [name] [name]. 09/10/2022 11:00 A M Medical Record Number: [id number] Patient Account Number: [id number] Date of Birth/Sex: Treating RN: 12/16/1928 (89 y.o. F) Primary Care Daelan Gatt:  [name], [name] Other Clinician: Referring Verline Kong: Treating [name]/Extender: Hoffman, [name] [name], [name] Weeks in Treatment: 6 Vital Signs Height(in): 60 Pulse(bpm): 106 Weight(lbs): 97 Blood Pressure(mmHg): 163/71 Body Mass Index(BMI): 18.9 Temperature(F): 98.2 Respiratory Rate(breaths/min): 18 [4:Photos:] [N/A:N/A] Left, Anterior Lower Leg N/A N/A Wound Location: Skin [name] ear/Laceration N/A N/A Wounding Event: Trauma, Other N/A N/A Primary Etiology: Anemia, Hypertension, Osteoarthritis N/A N/A Comorbid History: 05/22/2022 N/A N/A Date Acquired: 6 N/A N/A Weeks of Treatment: Open N/A N/A Wound Status: No N/A N/A Wound Recurrence: 3.5x0.9x0.1 N/A N/A Measurements L x W x D (cm) 2.474 N/A N/A A (cm) :  rea 0.247 N/A N/A Volume (cm) : 84.40% N/A N/A % Reduction in A rea: 92.20% N/A N/A % Reduction in Volume: Full Thickness With Exposed Support N/A N/A Classification: Structures Medium N/A N/A Exudate A mount: Serosanguineous N/A N/A Exudate Type: red, brown N/A N/A Exudate Color: Distinct, outline attached N/A N/A Wound Margin: Small (1-33%) N/A N/A Granulation A mount: Red N/A N/A Granulation Quality: Large (67-100%) N/A N/A Necrotic A mount: Fat Layer (Subcutaneous Tissue): Yes N/A N/A Exposed Structures: Fascia: No Tendon: No Muscle: No Joint: No Bone: No None N/A N/A Epithelialization: Debridement - Selective/Open Wound N/A N/A Debridement: Pre-procedure Verification/Time Out 11:52 N/A N/A Taken: Lidocaine 4% Topical Solution N/A N/A Pain Control: Slough N/A N/A Tissue Debrided: Non-Viable Tissue N/A N/A Level: 2.47 N/A N/A Debridement A (sq cm): rea Curette N/A N/A Instrument: Minimum N/A N/A Bleeding: Pressure N/A N/A Hemostasis A chieved: 0 N/A N/A Procedural Pain: 0 N/A N/A Post Procedural Pain: Procedure was tolerated well N/A N/A Debridement Treatment Response: 3.5x0.9x0.1 N/A N/A Post Debridement Measurements L  x W x D (cm) 0.247 N/A N/A Post Debridement Volume: (cm) Excoriation: No N/A N/A Periwound Skin Texture: Induration: No Callus: No Crepitus: No Rash: No Scarring: No Maceration: No N/A N/A Periwound Skin Moisture: Dry/Scaly: No Hemosiderin Staining: Yes N/A N/A Periwound Skin Color: Atrophie Blanche: No Cyanosis: No Ecchymosis: No Erythema: No Mottled: No Pallor: No Rubor: No No Abnormality N/A N/A Temperature: Debridement N/A N/A Procedures Performed: Treatment Notes Wound #4 (Lower Leg) Wound Laterality: Left, Anterior Cleanser Soap and Water Discharge Instruction: May shower and wash wound with dial antibacterial soap and water prior to dressing change. Vashe 5.8 (oz) Discharge Instruction: Cleanse the wound with Vashe prior to applying a clean dressing using gauze sponges, not tissue or cotton balls. Peri-Wound Care Sween Lotion (Moisturizing lotion) Discharge Instruction: Apply moisturizing lotion as directed [name], [name] ([id number]) [phone].pdf Page 4 of 7 Topical Gentamicin Discharge Instruction: As directed by physician Mupirocin Ointment Discharge Instruction: Apply Mupirocin (Bactroban) as instructed Santyl Discharge Instruction: apply directly wound bed. Primary Dressing Hydrofera Blue Ready Transfer Foam, 4x5 (in/in) Discharge Instruction: Apply to wound bed as instructed Secondary Dressing ABD Pad, 8x10 Discharge Instruction: Apply over primary dressing as directed. Woven Gauze Sponge, Non-Sterile 4x4 in Discharge Instruction: Apply over primary dressing as directed. Secured With Compression Wrap Kerlix Roll 4.5x3.1 (in/yd) Discharge Instruction: Apply Kerlix and Coban compression as directed. Coban Self-Adherent Wrap 4x5 (in/yd) Discharge Instruction: Apply over Kerlix as directed. Compression Stockings Add-Ons Electronic Signature(s) Signed: 09/10/2022 3:33:08 PM By: Geralyn Corwin DO Entered By: Geralyn Corwin on 09/10/2022 14:05:58 -------------------------------------------------------------------------------- Multi-Disciplinary Care Plan Details Patient Name: Date of Service: [name] [name] [name]. 09/10/2022 11:00 A M Medical Record Number: [id number] Patient Account Number: [id number] Date of Birth/Sex: Treating RN: 08-Aug-1928 (89 y.o. Gevena Mart Primary Care Niveah Boerner: [name], [name] Other Clinician: Referring Tabb Croghan: Treating Alanmichael Barmore/Extender: Hoffman, [name] [name], [name] Weeks in Treatment: 6 Active Inactive Wound/Skin Impairment Nursing Diagnoses: Impaired tissue integrity Goals: Patient/caregiver will verbalize understanding of skin care regimen Date Initiated: 07/30/2022 Target Resolution Date: 09/22/2022 Goal Status: Active Interventions: Assess patient/caregiver ability to obtain necessary supplies Assess patient/caregiver ability to perform ulcer/skin care regimen upon admission and as needed Assess ulceration(s) every visit Provide education on ulcer and skin care Screen for HBO [name], [name] ([id number]) [id number] Page 5 of 7 Treatment Activities: Skin care regimen initiated : 07/30/2022 Topical wound management initiated : 07/30/2022 Notes: Electronic Signature(s) Signed: 09/18/2022 2:03:55 PM By: Brenton Grills Entered By:  Brenton Grills on 09/10/2022 11:47:34 -------------------------------------------------------------------------------- Pain Assessment Details Patient Name: Date of Service: [name] ERZA, SHOR [name]. 09/10/2022 11:00 A M Medical Record Number: [id number] Patient Account Number: [id number] Date of Birth/Sex: Treating RN: 09-16-1928 (89 y.o. F) Primary Care Ronaldo Crilly: [name], [name] Other Clinician: Referring Zaia Carre: Treating Doyne Micke/Extender: Hoffman, [name] [name], [name] Weeks in Treatment: 6 Active Problems Location of Pain Severity and Description of Pain Patient Has Paino No Site  Locations Pain Management and Medication Current Pain Management: Electronic Signature(s) Signed: 09/13/2022 5:02:38 PM By: Thayer Dallas Entered By: Thayer Dallas on 09/10/2022 11:37:35 -------------------------------------------------------------------------------- Patient/Caregiver Education Details Patient Name: Date of Service: [name] Ezzard Standing 8/19/2024andnbsp11:00 A M Medical Record Number: [id number] Patient Account Number: [id number] Date of Birth/Gender: Treating RN: [birth year] (89 y.o. Gevena Mart Primary Care Physician: [name], [name] Other Clinician: Referring Physician: Treating Physician/Extender: Hoffman, [name] [name], [name] Weeks in Treatment: 6 [name], [name] ([id number]) [id number] Page 6 of 7 Education Assessment Education Provided To: Patient and Caregiver Education Topics Provided Wound/Skin Impairment: Methods: Explain/Verbal Responses: State content correctly Electronic Signature(s) Signed: 09/18/2022 2:03:55 PM By: Brenton Grills Entered By: Brenton Grills on 09/10/2022 11:47:56 -------------------------------------------------------------------------------- Wound Assessment Details Patient Name: Date of Service: [name] [name] [name]. 09/10/2022 11:00 A M Medical Record Number: [id number] Patient Account Number: [id number] Date of Birth/Sex: Treating RN: [birth year] (89 y.o. F) Primary Care Kevyn Boquet: [name], [name] Other Clinician: Referring Opal Mckellips: Treating Sascha Baugher/Extender: Hoffman, [name] [name], [name] Weeks in Treatment: 6 Wound Status Wound Number: 4 Primary Etiology: Trauma, Other Wound Location: Left, Anterior Lower Leg Wound Status: Open Wounding Event: Skin Tear/Laceration Comorbid History: Anemia, Hypertension, Osteoarthritis Date Acquired: 05/22/2022 Weeks Of Treatment: 6 Clustered Wound: No Photos Wound Measurements Length: (cm) 3.5 Width: (cm) 0.9 Depth: (cm) 0.1 Area: (cm)  2.474 Volume: (cm) 0.247 % Reduction in Area: 84.4% % Reduction in Volume: 92.2% Epithelialization: None Tunneling: No Undermining: No Wound Description Classification: Full Thickness With Exposed Suppor Wound Margin: Distinct, outline attached Exudate Amount: Medium Exudate Type: Serosanguineous Exudate Color: red, brown [name] Structures Foul Odor After Cleansing: No Slough/Fibrino Yes Wound Bed Granulation Amount: Small (1-33%) Exposed Structure Granulation Quality: Red Fascia Exposed: No [name], [name] ([id number]) [id number] Page 7 of 7 Necrotic Amount: Large (67-100%) Fat Layer (Subcutaneous Tissue) Exposed: Yes Necrotic Quality: Adherent Slough Tendon Exposed: No Muscle Exposed: No Joint Exposed: No Bone Exposed: No Periwound Skin Texture Texture Color No Abnormalities Noted: No No Abnormalities Noted: No Callus: No Atrophie Blanche: No Crepitus: No Cyanosis: No Excoriation: No Ecchymosis: No Induration: No Erythema: No Rash: No Hemosiderin Staining: Yes Scarring: No Mottled: No Pallor: No Moisture Rubor: No No Abnormalities Noted: No Dry / Scaly: No Temperature / Pain Maceration: No Temperature: No Abnormality Electronic Signature(s) Signed: 09/13/2022 5:02:38 PM By: Thayer Dallas Entered By: Thayer Dallas on 09/10/2022 11:32:00 -------------------------------------------------------------------------------- Vitals Details Patient Name: Date of Service: [name] [name] [name]. 09/10/2022 11:00 A M Medical Record Number: [id number] Patient Account Number: [id number] Date of Birth/Sex: Treating RN: March 26, 1928 (89 y.o. F) Primary Care Meosha Castanon: [name], [name] Other Clinician: Referring Tianne Plott: Treating Terena Bohan/Extender: Hoffman, [name] [name], [name] Weeks in Treatment: 6 Vital Signs Time Taken: 11:33 Temperature (F): 98.2 Height (in): 60 Pulse (bpm): 106 Weight (lbs): 97 Respiratory Rate (breaths/min): 18 Body  Mass Index (BMI): 18.9 Blood Pressure (mmHg): 163/71 Reference Range: 80 - 120 mg / dl Electronic Signature(s) Signed: 09/13/2022 5:02:38 PM By: Thayer Dallas Entered By: Thayer Dallas on 09/10/2022 11:37:26

## 2022-09-18 NOTE — Progress Notes (Signed)
[name], [name] ([id number]) [id number] Page 1 of 10 Visit Report for 09/10/2022 Chief Complaint Document Details Patient Name: Date of Service: [name], [name]. 09/10/2022 11:00 A M Medical Record Number: [id number] Patient Account Number: [id number] Date of Birth/Sex: Treating RN: 12/10/1928 (89 y.o. F) Primary Care Provider: [place], Betty Other Clinician: Referring Provider: Treating Provider/Extender: Romanda Turrubiates [place], Betty Weeks in Treatment: 6 Information Obtained from: Patient Chief Complaint 07/30/2022; left lower extremity wound secondary to trauma Electronic Signature(s) Signed: 09/10/2022 3:33:08 PM By: Geralyn Corwin DO Entered By: Geralyn Corwin on 09/10/2022 14:06:07 -------------------------------------------------------------------------------- Debridement Details Patient Name: Date of Service: [name] NDSO [name] Ngo [name]. 09/10/2022 11:00 A M Medical Record Number: [id number] Patient Account Number: [id number] Date of Birth/Sex: Treating RN: [birth year] (89 y.o. Gevena Mart Primary Care Provider: [place], Betty Other Clinician: Referring Provider: Treating Provider/Extender: Keyandra Swenson [place], Betty Weeks in Treatment: 6 Debridement Performed for Assessment: Wound #4 Left,Anterior Lower Leg Performed By: Physician Geralyn Corwin, DO Debridement Type: Debridement Level of Consciousness (Pre-procedure): Awake and Alert Pre-procedure Verification/Time Out Yes - 11:52 Taken: Start Time: 11:53 Pain Control: Lidocaine 4% [name] opical Solution Percent of Wound Bed Debrided: 100% [name] Area Debrided (cm): otal 2.47 Tissue and other material debrided: Viable, Non-Viable, Slough, Slough Level: Non-Viable Tissue Debridement Description: Selective/Open Wound Instrument: Curette Bleeding: Minimum Hemostasis Achieved: Pressure End Time: 11:55 Procedural Pain: 0 Post Procedural Pain: 0 Response to Treatment: Procedure was  tolerated well Level of Consciousness (Post- Awake and Alert procedure): Post Debridement Measurements of Total Wound Length: (cm) 3.5 Width: (cm) 0.9 Depth: (cm) 0.1 Volume: (cm) 0.247 Character of Wound/Ulcer Post Debridement: Improved [name], [name] ([id number]) [id number] Page 2 of 10 Post Procedure Diagnosis Same as Pre-procedure Notes Scribed for Dr Mikey Bussing by Brenton Grills RN. Electronic Signature(s) Signed: 09/10/2022 3:33:08 PM By: Geralyn Corwin DO Signed: 09/18/2022 2:03:55 PM By: Brenton Grills Entered By: Brenton Grills on 09/10/2022 11:55:19 -------------------------------------------------------------------------------- HPI Details Patient Name: Date of Service: [name] NDSO [name] Ngo [name]. 09/10/2022 11:00 A M Medical Record Number: [id number] Patient Account Number: [id number] Date of Birth/Sex: Treating RN: [birth year] (89 y.o. F) Primary Care Provider: [place], Betty Other Clinician: Referring Provider: Treating Provider/Extender: Crandall Harvel [place], Betty Weeks in Treatment: 6 History of Present Illness HPI Description: ADMISSION 10/06/2018 This is an 89 year old woman who is here for wounds on her bilateral anterior lower legs. She was previously seen in early 2017 with a wound on her left leg that was traumatic. Felt to have lymphedema and venous insufficiency at that time although she had a reflux study that did not show significant superficial reflux in the left lower extremity. This healed. At some point she has had stockings prescribed but she cannot get the standard stocking on. The patient is also on chronic prednisone I think related to polymyalgia rheumatica currently at 2.5 mg daily. The patient states that in July she slipped out of bed traumatizing the anterior part of her leg. She was seen by her primary care physicians beginning on 08/14/2018. It was noted to have some erythema and swelling she was given Keflex. On  7/30 she had doxycycline and I think he has had perhaps another round of doxycycline after this. Noted in their notes to have a history of chronic venous insufficiency and some edema. Sometime in August she traumatized her left anterior leg with the tip of her cane and has an open wound in this area as well. She has not been doing anything specific to the wounds themselves leaving them  open to air and putting Band-Aids on top. Past medical history; hypertension, polymyalgia rheumatica on chronic prednisone, history of microdiscectomy, B12 deficiency, vitamin D deficiency, emphysema, osteoporosis ABIs in our clinic were 0.96 on the right and 0.99 on the left. 9/22; 2 small wounds on the left anterior and right anterior tibial area. These are better than last week. We put her in 3 layer compression unfortunately she appears to have a new wound on the medial right lower leg anteriorly probably because of wrap injury. She is on chronic prednisone with a history of chronic polymyalgia rheumatica and has very frail skin on her lower extremities 9/29; 2 small wounds on the left anterior and right anterior tibial areas these continue to improve. The abrasion she had from last week appears to have closed over. 10/6; the patient had 2 wounds on the left anterior and right anterior tibial wounds. The left anterior wound is just about closed. Right anterior tibial is still open. 10/13; the patient's leg is healed on the left as predicted. The area on the right is still open but measuring smaller 10/20; the patient comes in today with a small area on the left medial lower leg. The area on the right is still open but measuring smaller. We had put her in a dual layer stocking last week on the left she is not able to get the 2 layers on she will only wear 1 layer. 10/27; the left medial lower leg is closed. The right one is just about closed. We continue to dress the right leg and compression we put her 11/5; the  left medial leg is closed and she has a juxta lite stocking on although her daughter had to apply it. The right one is closed today and we will put a juxta lite stocking on this leg as well. The patient states most of the skin changes are due to chronic steroid use. [name] the point this is true but she has tightly fibrotic o skin in her distal lower extremities suggesting some degree of chronic venous insufficiency as well. She does not have a lot of edema in her leg 07/30/2022 Ms. Monica Figura is a 89 year old female with a past medical history of essential hypertension, polymyalgia rheumatica on chronic steroids, and venous insufficiency that presents to the clinic for a 9-week history of nonhealing wound to the left lower extremities. She states she hit her leg against a table and developed a hematoma that eventually opened and drained. She has been using hydrogen peroxide to the wound bed. She currently denies signs of infection. She has not been on antibiotics. She has compression stockings at home but has difficulty putting these on. She states she cannot even put on a sock. 7/15; patient presents for follow-up. She has been using Vashe wet-to-dry dressings although technique is questionable. She has left this open to air for several days because she did not think she had the right gauze to use for the wet-to-dry dressing. 7/22; patient presents for follow-up. She has been using Vashe wet-to-dry dressings. More healthy granulation tissue present today. She had ABIs completed that showed an ABI of 1.07 on the left with biphasic waveforms throughout the system. 7/29; patient presents for follow-up. We have been using Santyl and Hydrofera Blue under Kerlix/Coban. She has tolerated this compression wrap well. Wound is smaller. 8/5; patient presents for follow-up. We have been using Santyl and Hydrofera Blue under Kerlix/Coban. The wound is smaller. We discussed potentially doing a skin substitute  and  patient was agreeable to have insurance verification done for this. [name], [name] ([id number]) [id number] Page 3 of 10 8/12; patient presents for follow-up. We have been doing Hydrofera Blue with Santyl under Kerlix/Coban. Wound is smaller today. Due to out-of-pocket cost patient has declined skin substitute placement. 8/19; patient presents for follow-up. We have been doing Hydrofera Blue and Santyl under Kerlix/Coban. Wound again is smaller. Electronic Signature(s) Signed: 09/10/2022 3:33:08 PM By: Geralyn Corwin DO Entered By: Geralyn Corwin on 09/10/2022 14:06:49 -------------------------------------------------------------------------------- Physical Exam Details Patient Name: Date of Service: [name] NDSO [name] Ngo [name]. 09/10/2022 11:00 A M Medical Record Number: [id number] Patient Account Number: [id number] Date of Birth/Sex: Treating RN: [birth year] (89 y.o. F) Primary Care Provider: [place], Betty Other Clinician: Referring Provider: Treating Provider/Extender: Iria Jamerson [place], Betty Weeks in Treatment: 6 Constitutional respirations regular, non-labored and within target range for patient.. Cardiovascular 2+ dorsalis pedis/posterior tibialis pulses. Psychiatric pleasant and cooperative. Notes Left lower extremity: [name] the anterior aspect there is an open wound with granulation tissue and nonviable tissue. No surrounding signs of infection. Venous o stasis dermatitis noted throughout the leg. Electronic Signature(s) Signed: 09/10/2022 3:33:08 PM By: Geralyn Corwin DO Entered By: Geralyn Corwin on 09/10/2022 14:07:26 -------------------------------------------------------------------------------- Physician Orders Details Patient Name: Date of Service: [name] NDSO [name] Ngo [name]. 09/10/2022 11:00 A M Medical Record Number: [id number] Patient Account Number: [id number] Date of Birth/Sex: Treating RN: 04/24/1928 (89 y.o. Gevena Mart Primary Care Provider: [place], Betty Other Clinician: Referring Provider: Treating Provider/Extender: Voyd Groft [place], Betty Weeks in Treatment: 6 Verbal / Phone Orders: No Diagnosis Coding ICD-10 Coding Code Description T79.8XXA Other early complications of trauma, initial encounter L97.822 Non-pressure chronic ulcer of other part of left lower leg with fat layer exposed I87.312 Chronic venous hypertension (idiopathic) with ulcer of left lower extremity M35.3 Polymyalgia rheumatica Z92.241 Personal history of systemic steroid therapy Follow-up Appointments ppointment in 1 week. - Dr. Mikey Bussing - room 8 or 9 Return A [place], Ysidra [name] ([id number]) [phone].pdf Page 4 of 10 Other: - Appt made for 9/9 at 10:15am. Anesthetic (In clinic) Topical Lidocaine 4% applied to wound bed Cellular or Tissue Based Products Other Cellular or Tissue Based Products Orders/Instructions: - Run IVR for Apligraf and Puraply. Approved, but patient opted not to have it. Bathing/ Shower/ Hygiene May shower and wash wound with soap and water. Edema Control - Lymphedema / SCD / Other Bilateral Lower Extremities Elevate legs to the level of the heart or above for 30 minutes daily and/or when sitting for 3-4 times a day throughout the day. Avoid standing for long periods of time. Wound Treatment Wound #4 - Lower Leg Wound Laterality: Left, Anterior Cleanser: Soap and Water 1 x Per Week/30 Days Discharge Instructions: May shower and wash wound with dial antibacterial soap and water prior to dressing change. Cleanser: Vashe 5.8 (oz) 1 x Per Week/30 Days Discharge Instructions: Cleanse the wound with Vashe prior to applying a clean dressing using gauze sponges, not tissue or cotton balls. Peri-Wound Care: Sween Lotion (Moisturizing lotion) 1 x Per Week/30 Days Discharge Instructions: Apply moisturizing lotion as directed Topical: Gentamicin 1 x Per Week/30  Days Discharge Instructions: As directed by physician Topical: Mupirocin Ointment 1 x Per Week/30 Days Discharge Instructions: Apply Mupirocin (Bactroban) as instructed Topical: Santyl 1 x Per Week/30 Days Discharge Instructions: apply directly wound bed. Prim Dressing: Hydrofera Blue Ready Transfer Foam, 4x5 (in/in) 1 x Per Week/30 Days ary Discharge Instructions: Apply to wound bed as instructed Secondary Dressing: ABD Pad, 8x10 1  x Per Week/30 Days Discharge Instructions: Apply over primary dressing as directed. Secondary Dressing: Woven Gauze Sponge, Non-Sterile 4x4 in 1 x Per Week/30 Days Discharge Instructions: Apply over primary dressing as directed. Compression Wrap: Kerlix Roll 4.5x3.1 (in/yd) (Generic) 1 x Per Week/30 Days Discharge Instructions: Apply Kerlix and Coban compression as directed. Compression Wrap: Coban Self-Adherent Wrap 4x5 (in/yd) 1 x Per Week/30 Days Discharge Instructions: Apply over Kerlix as directed. Electronic Signature(s) Signed: 09/10/2022 3:33:08 PM By: Geralyn Corwin DO Entered By: Geralyn Corwin on 09/10/2022 14:07:34 -------------------------------------------------------------------------------- Problem List Details Patient Name: Date of Service: [name] NDSO [name] Ngo [name]. 09/10/2022 11:00 A M Medical Record Number: [id number] Patient Account Number: [id number] Date of Birth/Sex: Treating RN: October 31, 1928 (89 y.o. Gevena Mart Primary Care Provider: [place], Betty Other Clinician: Referring Provider: Treating Provider/Extender: Johnthan Axtman [place], Betty Weeks in Treatment: 6 Active Problems Im, Henreitta [name] ([id number]) [id number] Page 5 of 10 ICD-10 Encounter Code Description Active Date MDM Diagnosis T79.8XXA Other early complications of trauma, initial encounter 07/30/2022 No Yes L97.822 Non-pressure chronic ulcer of other part of left lower leg with fat layer exposed7/08/2022 No Yes I87.312 Chronic  venous hypertension (idiopathic) with ulcer of left lower extremity 07/30/2022 No Yes M35.3 Polymyalgia rheumatica 07/30/2022 No Yes Z92.241 Personal history of systemic steroid therapy 07/30/2022 No Yes Inactive Problems Resolved Problems Electronic Signature(s) Signed: 09/10/2022 3:33:08 PM By: Geralyn Corwin DO Entered By: Geralyn Corwin on 09/10/2022 14:05:51 -------------------------------------------------------------------------------- Progress Note Details Patient Name: Date of Service: [name] NDSO [name] Ngo [name]. 09/10/2022 11:00 A M Medical Record Number: [id number] Patient Account Number: [id number] Date of Birth/Sex: Treating RN: February 25, 1928 (89 y.o. F) Primary Care Provider: [place], Betty Other Clinician: Referring Provider: Treating Provider/Extender: Addylin [name] [place], Betty Weeks in Treatment: 6 Subjective Chief Complaint Information obtained from Patient 07/30/2022; left lower extremity wound secondary to trauma History of Present Illness (HPI) ADMISSION 10/06/2018 This is an 89 year old woman who is here for wounds on her bilateral anterior lower legs. She was previously seen in early 2017 with a wound on her left leg that was traumatic. Felt to have lymphedema and venous insufficiency at that time although she had a reflux study that did not show significant superficial reflux in the left lower extremity. This healed. At some point she has had stockings prescribed but she cannot get the standard stocking on. The patient is also on chronic prednisone I think related to polymyalgia rheumatica currently at 2.5 mg daily. The patient states that in July she slipped out of bed traumatizing the anterior part of her leg. She was seen by her primary care physicians beginning on 08/14/2018. It was noted to have some erythema and swelling she was given Keflex. On 7/30 she had doxycycline and I think he has had perhaps another round of doxycycline after this. Noted in their notes to  have a history of chronic venous insufficiency and some edema. Sometime in August she traumatized her left anterior leg with the tip of her cane and has an open wound in this area as well. She has not been doing anything specific to the wounds themselves leaving them open to air and putting Band-Aids on top. Past medical history; hypertension, polymyalgia rheumatica on chronic prednisone, history of microdiscectomy, B12 deficiency, vitamin D deficiency, emphysema, osteoporosis ABIs in our clinic were 0.96 on the right and 0.99 on the left. 9/22; 2 small wounds on the left anterior and right anterior tibial area. These are better than last week. We put her in 3 layer compression unfortunately  she appears to have a new wound on the medial right lower leg anteriorly probably because of wrap injury. She is on chronic prednisone with a history of chronic polymyalgia rheumatica and has very frail skin on her lower extremities [name], [name] ([id number]) [phone].pdf Page 6 of 10 9/29; 2 small wounds on the left anterior and right anterior tibial areas these continue to improve. The abrasion she had from last week appears to have closed over. 10/6; the patient had 2 wounds on the left anterior and right anterior tibial wounds. The left anterior wound is just about closed. Right anterior tibial is still open. 10/13; the patient's leg is healed on the left as predicted. The area on the right is still open but measuring smaller 10/20; the patient comes in today with a small area on the left medial lower leg. The area on the right is still open but measuring smaller. We had put her in a dual layer stocking last week on the left she is not able to get the 2 layers on she will only wear 1 layer. 10/27; the left medial lower leg is closed. The right one is just about closed. We continue to dress the right leg and compression we put her 11/5; the left medial leg is closed and she  has a juxta lite stocking on although her daughter had to apply it. The right one is closed today and we will put a juxta lite stocking on this leg as well. The patient states most of the skin changes are due to chronic steroid use. [name] the point this is true but she has tightly fibrotic o skin in her distal lower extremities suggesting some degree of chronic venous insufficiency as well. She does not have a lot of edema in her leg 07/30/2022 Ms. Uyen Shelly is a 89 year old female with a past medical history of essential hypertension, polymyalgia rheumatica on chronic steroids, and venous insufficiency that presents to the clinic for a 9-week history of nonhealing wound to the left lower extremities. She states she hit her leg against a table and developed a hematoma that eventually opened and drained. She has been using hydrogen peroxide to the wound bed. She currently denies signs of infection. She has not been on antibiotics. She has compression stockings at home but has difficulty putting these on. She states she cannot even put on a sock. 7/15; patient presents for follow-up. She has been using Vashe wet-to-dry dressings although technique is questionable. She has left this open to air for several days because she did not think she had the right gauze to use for the wet-to-dry dressing. 7/22; patient presents for follow-up. She has been using Vashe wet-to-dry dressings. More healthy granulation tissue present today. She had ABIs completed that showed an ABI of 1.07 on the left with biphasic waveforms throughout the system. 7/29; patient presents for follow-up. We have been using Santyl and Hydrofera Blue under Kerlix/Coban. She has tolerated this compression wrap well. Wound is smaller. 8/5; patient presents for follow-up. We have been using Santyl and Hydrofera Blue under Kerlix/Coban. The wound is smaller. We discussed potentially doing a skin substitute and patient was agreeable to have  insurance verification done for this. 8/12; patient presents for follow-up. We have been doing Hydrofera Blue with Santyl under Kerlix/Coban. Wound is smaller today. Due to out-of-pocket cost patient has declined skin substitute placement. 8/19; patient presents for follow-up. We have been doing Hydrofera Blue and Santyl under Kerlix/Coban. Wound again is smaller. Patient  History Information obtained from Patient. Family History Cancer - Father, Heart Disease - Father, Lung Disease - Father, No family history of Diabetes, Hereditary Spherocytosis, Hypertension, Seizures, Stroke, Thyroid Problems, Tuberculosis. Social History Former smoker, Marital Status - Widowed, Alcohol Use - Moderate, Drug Use - No History, Caffeine Use - Moderate. Medical History Eyes Denies history of Cataracts, Glaucoma, Optic Neuritis Ear/Nose/Mouth/Throat Denies history of Chronic sinus problems/congestion, Middle ear problems Hematologic/Lymphatic Patient has history of Anemia Denies history of Hemophilia, Human Immunodeficiency Virus, Lymphedema, Sickle Cell Disease Respiratory Denies history of Aspiration, Asthma, Chronic Obstructive Pulmonary Disease (COPD), Pneumothorax, Sleep Apnea, Tuberculosis Cardiovascular Patient has history of Hypertension Denies history of Angina, Arrhythmia, Congestive Heart Failure, Coronary Artery Disease, Deep Vein Thrombosis, Hypotension, Myocardial Infarction, Peripheral Arterial Disease, Peripheral Venous Disease, Phlebitis, Vasculitis Gastrointestinal Denies history of Cirrhosis , Colitis, Crohns, Hepatitis A, Hepatitis B, Hepatitis C Endocrine Denies history of Type I Diabetes, Type II Diabetes Genitourinary Denies history of End Stage Renal Disease Immunological Denies history of Lupus Erythematosus, Raynauds, Scleroderma Integumentary (Skin) Denies history of History of Burn Musculoskeletal Patient has history of Osteoarthritis Denies history of Gout, Rheumatoid  Arthritis, Osteomyelitis Neurologic Denies history of Dementia, Neuropathy, Quadriplegia, Paraplegia, Seizure Disorder Oncologic Denies history of Received Chemotherapy, Received Radiation Psychiatric Denies history of Anorexia/bulimia, Confinement Anxiety Objective [name], [name] ([id number]) [id number] Page 7 of 10 Constitutional respirations regular, non-labored and within target range for patient.. Vitals Time Taken: 11:33 AM, Height: 60 in, Weight: 97 lbs, BMI: 18.9, Temperature: 98.2 F, Pulse: 106 bpm, Respiratory Rate: 18 breaths/min, Blood Pressure: 163/71 mmHg. Cardiovascular 2+ dorsalis pedis/posterior tibialis pulses. Psychiatric pleasant and cooperative. General Notes: Left lower extremity: [name] the anterior aspect there is an open wound with granulation tissue and nonviable tissue. No surrounding signs of o infection. Venous stasis dermatitis noted throughout the leg. Integumentary (Hair, Skin) Wound #4 status is Open. Original cause of wound was Skin [name] ear/Laceration. The date acquired was: 05/22/2022. The wound has been in treatment 6 weeks. The wound is located on the Left,Anterior Lower Leg. The wound measures 3.5cm length x 0.9cm width x 0.1cm depth; 2.474cm^2 area and 0.247cm^3 volume. There is Fat Layer (Subcutaneous Tissue) exposed. There is no tunneling or undermining noted. There is a medium amount of serosanguineous drainage noted. The wound margin is distinct with the outline attached to the wound base. There is small (1-33%) **red granulation within the wound bed. There is a large (67- 100%) amount of necrotic tissue within the wound bed including Adherent Slough. The periwound skin appearance exhibited: Hemosiderin Staining. The periwound skin appearance did not exhibit: Callus, Crepitus, Excoriation, Induration, Rash, Scarring, Dry/Scaly, Maceration, Atrophie Blanche, Cyanosis, Ecchymosis, Mottled, Pallor, Rubor, Erythema. Periwound  temperature was noted as No Abnormality. Assessment Active Problems ICD-10 Other early complications of trauma, initial encounter Non-pressure chronic ulcer of other part of left lower leg with fat layer exposed Chronic venous hypertension (idiopathic) with ulcer of left lower extremity Polymyalgia rheumatica Personal history of systemic steroid therapy Patient's wound has shown improvement in size) since last clinic visit. I debrided nonviable tissue. I recommended continue the course with Hydrofera Blue and Santyl under Kerlix/Coban to the left lower extremity. Follow-up in 1 week. Procedures Wound #4 Pre-procedure diagnosis of Wound #4 is a Trauma, Other located on the Left,Anterior Lower Leg . There was a Selective/Open Wound Non-Viable Tissue Debridement with a total area of 2.47 sq cm performed by Geralyn Corwin, DO. With the following instrument(s): Curette to remove Viable and Non-Viable tissue/material. Material removed includes  Slough after achieving pain control using Lidocaine 4% Topical Solution. No specimens were taken. A time out was conducted at 11:52, prior to the start of the procedure. A Minimum amount of bleeding was controlled with Pressure. The procedure was tolerated well with a pain level of 0 throughout and a pain level of 0 following the procedure. Post Debridement Measurements: 3.5cm length x 0.9cm width x 0.1cm depth; 0.247cm^3 volume. Character of Wound/Ulcer Post Debridement is improved. Post procedure Diagnosis Wound #4: Same as Pre-Procedure General Notes: Scribed for Dr Mikey Bussing by Brenton Grills RN.Marland Kitchen Plan Follow-up Appointments: Return Appointment in 1 week. - Dr. Mikey Bussing - room 8 or 9 Other: - Appt made for 9/9 at 10:15am. Anesthetic: (In clinic) Topical Lidocaine 4% applied to wound bed Cellular or Tissue Based Products: Other Cellular or Tissue Based Products Orders/Instructions: - Run IVR for Apligraf and Puraply. Approved, but patient opted not to  have it. Bathing/ Shower/ Hygiene: May shower and wash wound with soap and water. Edema Control - Lymphedema / SCD / Other: Elevate legs to the level of the heart or above for 30 minutes daily and/or when sitting for 3-4 times a day throughout the day. Avoid standing for long periods of time. WOUND #4: - Lower Leg Wound Laterality: Left, Anterior Cleanser: Soap and Water 1 x Per Week/30 Days Discharge Instructions: May shower and wash wound with dial antibacterial soap and water prior to dressing change. Cleanser: Vashe 5.8 (oz) 1 x Per Week/30 Days Discharge Instructions: Cleanse the wound with Vashe prior to applying a clean dressing using gauze sponges, not tissue or cotton balls. Peri-Wound Care: Sween Lotion (Moisturizing lotion) 1 x Per Week/30 Days Discharge Instructions: Apply moisturizing lotion as directed [name], [name] ([id number]) [phone].pdf Page 8 of 10 Topical: Gentamicin 1 x Per Week/30 Days Discharge Instructions: As directed by physician Topical: Mupirocin Ointment 1 x Per Week/30 Days Discharge Instructions: Apply Mupirocin (Bactroban) as instructed Topical: Santyl 1 x Per Week/30 Days Discharge Instructions: apply directly wound bed. Prim Dressing: Hydrofera Blue Ready Transfer Foam, 4x5 (in/in) 1 x Per Week/30 Days ary Discharge Instructions: Apply to wound bed as instructed Secondary Dressing: ABD Pad, 8x10 1 x Per Week/30 Days Discharge Instructions: Apply over primary dressing as directed. Secondary Dressing: Woven Gauze Sponge, Non-Sterile 4x4 in 1 x Per Week/30 Days Discharge Instructions: Apply over primary dressing as directed. Com pression Wrap: Kerlix Roll 4.5x3.1 (in/yd) (Generic) 1 x Per Week/30 Days Discharge Instructions: Apply Kerlix and Coban compression as directed. Com pression Wrap: Coban Self-Adherent Wrap 4x5 (in/yd) 1 x Per Week/30 Days Discharge Instructions: Apply over Kerlix as directed. 1. In office sharp  debridement 2. Hydrofera Blue and Santyl under Kerlix/Coban to the left lower extremity 3. Follow-up in 1 week Electronic Signature(s) Signed: 09/10/2022 3:33:08 PM By: Geralyn Corwin DO Entered By: Geralyn Corwin on 09/10/2022 14:10:25 -------------------------------------------------------------------------------- HxROS Details Patient Name: Date of Service: [name] NDSO [name] Ngo [name]. 09/10/2022 11:00 A M Medical Record Number: [id number] Patient Account Number: [id number] Date of Birth/Sex: Treating RN: Feb 11, 1928 (89 y.o. F) Primary Care Provider: [place], Betty Other Clinician: Referring Provider: Treating Provider/Extender: Felix Pratt [place], Betty Weeks in Treatment: 6 Information Obtained From Patient Eyes Medical History: Negative for: Cataracts; Glaucoma; Optic Neuritis Ear/Nose/Mouth/Throat Medical History: Negative for: Chronic sinus problems/congestion; Middle ear problems Hematologic/Lymphatic Medical History: Positive for: Anemia Negative for: Hemophilia; Human Immunodeficiency Virus; Lymphedema; Sickle Cell Disease Respiratory Medical History: Negative for: Aspiration; Asthma; Chronic Obstructive Pulmonary Disease (COPD); Pneumothorax; Sleep Apnea; Tuberculosis Cardiovascular Medical History: Positive  for: Hypertension Negative for: Angina; Arrhythmia; Congestive Heart Failure; Coronary Artery Disease; Deep Vein Thrombosis; Hypotension; Myocardial Infarction; Peripheral Arterial Disease; Peripheral Venous Disease; Phlebitis; Vasculitis Gastrointestinal Medical History: Negative for: Cirrhosis ; Colitis; Crohns; Hepatitis A; Hepatitis B; Hepatitis C [name], [name] ([id number]) [id number].pdf Page 9 of 10 Endocrine Medical History: Negative for: Type I Diabetes; Type II Diabetes Genitourinary Medical History: Negative for: End Stage Renal Disease Immunological Medical History: Negative for: Lupus Erythematosus; Raynauds;  Scleroderma Integumentary (Skin) Medical History: Negative for: History of Burn Musculoskeletal Medical History: Positive for: Osteoarthritis Negative for: Gout; Rheumatoid Arthritis; Osteomyelitis Neurologic Medical History: Negative for: Dementia; Neuropathy; Quadriplegia; Paraplegia; Seizure Disorder Oncologic Medical History: Negative for: Received Chemotherapy; Received Radiation Psychiatric Medical History: Negative for: Anorexia/bulimia; Confinement Anxiety Immunizations Pneumococcal Vaccine: Received Pneumococcal Vaccination: No Implantable Devices None Family and Social History Cancer: Yes - Father; Diabetes: No; Heart Disease: Yes - Father; Hereditary Spherocytosis: No; Hypertension: No; Lung Disease: Yes - Father; Seizures: No; Stroke: No; Thyroid Problems: No; Tuberculosis: No; Former smoker; Marital Status - Widowed; Alcohol Use: Moderate; Drug Use: No History; Caffeine Use: Moderate; Financial Concerns: No; Food, Clothing or Shelter Needs: No; Support System Lacking: No; Transportation Concerns: No Electronic Signature(s) Signed: 09/10/2022 3:33:08 PM By: Geralyn Corwin DO Entered By: Geralyn Corwin on 09/10/2022 14:06:56 -------------------------------------------------------------------------------- SuperBill Details Patient Name: Date of Service: [name] NDSO [name] Ngo [name]. 09/10/2022 Medical Record Number: [id number] Patient Account Number: [id number] Date of Birth/Sex: Treating RN: [birth year] (89 y.o. Gevena Mart Primary Care Provider: [place], Betty Other Clinician: Referring Provider: Treating Provider/Extender: [name] [place], Betty Weeks in Treatment: 6 [name], [name] ([id number]) [id number] Page 10 of 10 Diagnosis Coding ICD-10 Codes Code Description T79.8XXA Other early complications of trauma, initial encounter L97.822 Non-pressure chronic ulcer of other part of left lower leg with fat layer  exposed I87.312 Chronic venous hypertension (idiopathic) with ulcer of left lower extremity M35.3 Polymyalgia rheumatica Z92.241 Personal history of systemic steroid therapy Facility Procedures : CPT4 Code: 56387564 Description: 97597 - DEBRIDE WOUND 1ST 20 SQ CM OR < ICD-10 Diagnosis Description L97.822 Non-pressure chronic ulcer of other part of left lower leg with fat layer expose I87.312 Chronic venous hypertension (idiopathic) with ulcer of left lower extremity Modifier: d Quantity: 1 Physician Procedures : CPT4 Code Description Modifier [id number] 97597 - WC PHYS DEBR WO ANESTH 20 SQ CM ICD-10 Diagnosis Description L97.822 Non-pressure chronic ulcer of other part of left lower leg with fat layer exposed I87.312 Chronic venous hypertension (idiopathic) with  ulcer of left lower extremity Quantity: 1 Electronic Signature(s) Signed: 09/10/2022 3:33:08 PM By: Geralyn Corwin DO Entered By: Geralyn Corwin on 09/10/2022 14:10:56

## 2022-09-18 NOTE — Progress Notes (Signed)
[name] ([id number]) [id number] Page 1 of 7 Visit Report for 08/27/2022 Arrival Information Details Patient Name: Date of Service: [name], [name]. 08/27/2022 2:00 PM Medical Record Number: [id number] Patient Account Number: [id number] Date of Birth/Sex: Treating RN: May 22, 1928 (89 y.o. F) Primary Care Zyah Gomm: [name], [name] Other Clinician: Referring [name]: Treating [name]/Extender: Hoffman, [name], [name] Weeks in Treatment: 4 Visit Information History Since Last Visit Added or deleted any medications: No Patient Arrived: [name] Any new allergies or adverse reactions: No Arrival Time: 14:07 Had a fall or experienced change in No Accompanied By: daughter activities of daily living that may affect Transfer Assistance: None risk of falls: Patient Identification Verified: Yes Signs or symptoms of abuse/neglect since last visito No Secondary Verification Process Completed: Yes Hospitalized since last visit: No Implantable device outside of the clinic excluding No cellular tissue based products placed in the center since last visit: Has Dressing in Place as Prescribed: Yes Has Compression in Place as Prescribed: Yes Pain Present Now: No Electronic Signature(s) Signed: 08/27/2022 5:04:15 PM By: Thayer Dallas Entered By: Thayer Dallas on 08/27/2022 14:11:49 -------------------------------------------------------------------------------- Encounter Discharge Information Details Patient Name: Date of Service: [name] NDSO [name]. 08/27/2022 2:00 PM Medical Record Number: [id number] Patient Account Number: [id number] Date of Birth/Sex: Treating RN: 1928-02-08 (89 y.o. [name] Primary Care Izabelle Daus: [name], [name] Other Clinician: Referring Kimyah Frein: Treating Tina Gruner/Extender: Hoffman, [name], [name] Weeks in Treatment: 4 Encounter Discharge Information Items Post Procedure Vitals Discharge Condition:  Stable Temperature (F): 97.9 Ambulatory Status: Cane Pulse (bpm): 64 Discharge Destination: Home Respiratory Rate (breaths/min): 18 Transportation: Private Auto Blood Pressure (mmHg): 142/78 Accompanied By: daughter Schedule Follow-up Appointment: Yes Clinical Summary of Care: Patient Declined Electronic Signature(s) Signed: 09/18/2022 2:03:55 PM By: Brenton Grills Entered By: Brenton Grills on 08/27/2022 15:16:03 [name], [name] ([id number]) [id number] Page 2 of 7 -------------------------------------------------------------------------------- Lower Extremity Assessment Details Patient Name: Date of Service: [name] NDSO [name]. 08/27/2022 2:00 PM Medical Record Number: [id number] Patient Account Number: [id number] Date of Birth/Sex: Treating RN: 1928-07-07 (89 y.o. F) Primary Care Solaris Kram: [name], [name] Other Clinician: Referring India Jolin: Treating Amalio Loe/Extender: Hoffman, [name], [name] Weeks in Treatment: 4 Edema Assessment Assessed: [Left: No] [Right: No] [Left: Edema] [Right: :] Calf Left: Right: Point of Measurement: 27 cm From Medial Instep 28 cm Ankle Left: Right: Point of Measurement: 10 cm From Medial Instep 17.5 cm Vascular Assessment Pulses: Dorsalis Pedis Palpable: [Left:Yes] Extremity colors, hair growth, and conditions: Extremity Color: [Left:Hyperpigmented] Hair Growth on Extremity: [Left:No] Temperature of Extremity: [Left:Warm] Capillary Refill: [Left:< 3 seconds] Dependent Rubor: [Left:No No] Toe Nail Assessment Left: Right: Thick: No Discolored: No Deformed: No Improper Length and Hygiene: No Electronic Signature(s) Signed: 09/18/2022 2:03:55 PM By: Brenton Grills Entered By: Brenton Grills on 08/27/2022 14:30:41 -------------------------------------------------------------------------------- Multi Wound Chart Details Patient Name: Date of Service: [name] NDSO [name]. 08/27/2022 2:00 PM Medical  Record Number: [id number] Patient Account Number: [id number] Date of Birth/Sex: Treating RN: 24-Mar-1928 (89 y.o. F) Primary Care Nycole Kawahara: [name], [name] Other Clinician: Referring Armanie Martine: Treating Jordani Nunn/Extender: Hoffman, [name], [name] Weeks in Treatment: 4 Vital Signs Height(in): 60 Pulse(bpm): 67 Weight(lbs): 97 Blood Pressure(mmHg): 171/76 Body Mass Index(BMI): 18.9 [name], [name] ([id number]) [id number] Page 3 of 7 Temperature(F): 98.1 Respiratory Rate(breaths/min): 18 [4:Photos:] [N/A:N/A] Left, Anterior Lower Leg N/A N/A Wound Location: Skin [name] ear/Laceration N/A N/A Wounding Event: Trauma, Other N/A N/A Primary Etiology: Anemia, Hypertension, Osteoarthritis N/A N/A Comorbid History: 05/22/2022 N/A N/A Date Acquired: 4 N/A N/A Weeks of Treatment:  Open N/A N/A Wound Status: No N/A N/A Wound Recurrence: 6x1.8x0.1 N/A N/A Measurements L x W x D (cm) 8.482 N/A N/A A (cm) : rea 0.848 N/A N/A Volume (cm) : 46.70% N/A N/A % Reduction in A rea: 73.30% N/A N/A % Reduction in Volume: Full Thickness With Exposed Support N/A N/A Classification: Structures Medium N/A N/A Exudate A mount: Serosanguineous N/A N/A Exudate Type: red, brown N/A N/A Exudate Color: Medium (34-66%) N/A N/A Granulation A mount: Red N/A N/A Granulation Quality: Medium (34-66%) N/A N/A Necrotic A mount: Fat Layer (Subcutaneous Tissue): Yes N/A N/A Exposed Structures: Fascia: No Tendon: No Muscle: No Joint: No Bone: No None N/A N/A Epithelialization: Debridement - Selective/Open Wound N/A N/A Debridement: Pre-procedure Verification/Time Out 14:51 N/A N/A Taken: Lidocaine 4% Topical Solution N/A N/A Pain Control: Slough N/A N/A Tissue Debrided: Non-Viable Tissue N/A N/A Level: 8.48 N/A N/A Debridement A (sq cm): rea Curette N/A N/A Instrument: Minimum N/A N/A Bleeding: Pressure N/A N/A Hemostasis A chieved: 0 N/A  N/A Procedural Pain: 0 N/A N/A Post Procedural Pain: Procedure was tolerated well N/A N/A Debridement Treatment Response: 6x1.8x0.1 N/A N/A Post Debridement Measurements L x W x D (cm) 0.848 N/A N/A Post Debridement Volume: (cm) Excoriation: No N/A N/A Periwound Skin Texture: Induration: No Callus: No Crepitus: No Rash: No Scarring: No Maceration: No N/A N/A Periwound Skin Moisture: Dry/Scaly: No Hemosiderin Staining: Yes N/A N/A Periwound Skin Color: Atrophie Blanche: No Cyanosis: No Ecchymosis: No Erythema: No Mottled: No Pallor: No Rubor: No No Abnormality N/A N/A Temperature: Debridement N/A N/A Procedures Performed: Treatment Notes Electronic Signature(s) Signed: 08/27/2022 4:29:36 PM By: Geralyn Corwin DO Rebello, Keyleigh [name] ([id number]) [id number] Page 4 of 7 Entered By: Geralyn Corwin on 08/27/2022 15:13:19 -------------------------------------------------------------------------------- Multi-Disciplinary Care Plan Details Patient Name: Date of Service: [name], [name]. 08/27/2022 2:00 PM Medical Record Number: [id number] Patient Account Number: [id number] Date of Birth/Sex: Treating RN: 11-16-1928 (89 y.o. [name] Primary Care Karli Wickizer: [name], [name] Other Clinician: Referring Ra Pfiester: Treating Rehana Uncapher/Extender: Hoffman, [name], [name] Weeks in Treatment: 4 Active Inactive Wound/Skin Impairment Nursing Diagnoses: Impaired tissue integrity Goals: Patient/caregiver will verbalize understanding of skin care regimen Date Initiated: 07/30/2022 Target Resolution Date: 09/22/2022 Goal Status: Active Interventions: Assess patient/caregiver ability to obtain necessary supplies Assess patient/caregiver ability to perform ulcer/skin care regimen upon admission and as needed Assess ulceration(s) every visit Provide education on ulcer and skin care Screen for HBO Treatment Activities: Skin care regimen  initiated : 07/30/2022 Topical wound management initiated : 07/30/2022 Notes: Electronic Signature(s) Signed: 09/18/2022 2:03:55 PM By: Brenton Grills Entered By: Brenton Grills on 08/27/2022 14:35:43 -------------------------------------------------------------------------------- Pain Assessment Details Patient Name: Date of Service: [name] NDSO [name]. 08/27/2022 2:00 PM Medical Record Number: [id number] Patient Account Number: [id number] Date of Birth/Sex: Treating RN: 07/16/1928 (89 y.o. [name] Primary Care Leslieann Whisman: [name], [name] Other Clinician: Referring Galia Rahm: Treating Darla Mcdonald/Extender: Hoffman, [name], [name] Weeks in Treatment: 4 Active Problems Location of Pain Severity and Description of Pain Patient Has Paino No Site Locations [name], [name] ([id number]) [id number] Page 5 of 7 Pain Management and Medication Current Pain Management: Electronic Signature(s) Signed: 09/18/2022 2:03:55 PM By: Brenton Grills Entered By: Brenton Grills on 08/27/2022 14:29:14 -------------------------------------------------------------------------------- Patient/Caregiver Education Details Patient Name: Date of Service: [name] NDSO [name] 8/5/2024andnbsp2:00 PM Medical Record Number: [id number] Patient Account Number: [id number] Date of Birth/Gender: Treating RN: Dec 04, 1928 (89 y.o. [name] Primary Care Physician: [name], [name] Other Clinician: Referring Physician: Treating Physician/Extender: Hoffman, [name],  Harrell Lark in Treatment: 4 Education Assessment Education Provided To: Patient and Caregiver Education Topics Provided Wound/Skin Impairment: Methods: Explain/Verbal Responses: State content correctly Electronic Signature(s) Signed: 09/18/2022 2:03:55 PM By: Brenton Grills Entered By: Brenton Grills on 08/27/2022  14:36:11 -------------------------------------------------------------------------------- Wound Assessment Details Patient Name: Date of Service: [name] NDSO [name]. 08/27/2022 2:00 PM Medical Record Number: [id number] Patient Account Number: [id number] Date of Birth/Sex: Treating RN: [birth year] (89 y.o. F) Primary Care Yaneisy Wenz: [name], [name] Other Clinician: Referring Brittain Smithey: Treating Lekisha Mcghee/Extender: Hoffman, [name], [name] [name], [name] ([id number]) [id number] Page 6 of 7 Weeks in Treatment: 4 Wound Status Wound Number: 4 Primary Etiology: Trauma, Other Wound Location: Left, Anterior Lower Leg Wound Status: Open Wounding Event: Skin Tear/Laceration Comorbid History: Anemia, Hypertension, Osteoarthritis Date Acquired: 05/22/2022 Weeks Of Treatment: 4 Clustered Wound: No Photos Wound Measurements Length: (cm) 6 Width: (cm) 1.8 Depth: (cm) 0.1 Area: (cm) 8.482 Volume: (cm) 0.848 % Reduction in Area: 46.7% % Reduction in Volume: 73.3% Epithelialization: None Tunneling: No Undermining: No Wound Description Classification: Full Thickness With Exposed Suppor Exudate Amount: Medium Exudate Type: Serosanguineous Exudate Color: red, brown [name] Structures Foul Odor After Cleansing: No Slough/Fibrino Yes Wound Bed Granulation Amount: Medium (34-66%) Exposed Structure Granulation Quality: Red Fascia Exposed: No Necrotic Amount: Medium (34-66%) Fat Layer (Subcutaneous Tissue) Exposed: Yes Necrotic Quality: Adherent Slough Tendon Exposed: No Muscle Exposed: No Joint Exposed: No Bone Exposed: No Periwound Skin Texture Texture Color No Abnormalities Noted: No No Abnormalities Noted: No Callus: No Atrophie Blanche: No Crepitus: No Cyanosis: No Excoriation: No Ecchymosis: No Induration: No Erythema: No Rash: No Hemosiderin Staining: Yes Scarring: No Mottled: No Pallor: No Moisture Rubor: No No Abnormalities Noted:  No Dry / Scaly: No Temperature / Pain Maceration: No Temperature: No Abnormality Electronic Signature(s) Signed: 08/27/2022 5:04:15 PM By: Thayer Dallas Entered By: Thayer Dallas on 08/27/2022 14:28:48 [name], [name] ([id number]) [id number] Page 7 of 7 -------------------------------------------------------------------------------- Vitals Details Patient Name: Date of Service: [name], WILLEMS [name]. 08/27/2022 2:00 PM Medical Record Number: [id number] Patient Account Number: [id number] Date of Birth/Sex: Treating RN: 08/28/1928 (89 y.o. F) Primary Care Tamra Koos: [name], [name] Other Clinician: Referring Jerrilyn Messinger: Treating Toree Edling/Extender: Hoffman, [name], [name] Weeks in Treatment: 4 Vital Signs Time Taken: 14:24 Temperature (F): 98.1 Height (in): 60 Pulse (bpm): 67 Weight (lbs): 97 Respiratory Rate (breaths/min): 18 Body Mass Index (BMI): 18.9 Blood Pressure (mmHg): 171/76 Reference Range: 80 - 120 mg / dl Electronic Signature(s) Signed: 08/27/2022 5:04:15 PM By: Thayer Dallas Entered By: Thayer Dallas on 08/27/2022 14:25:27

## 2022-09-19 NOTE — Progress Notes (Signed)
[name] ([id number]) [id number] Page 1 of 8 Visit Report for 09/17/2022 Arrival Information Details Patient Name: Date of Service: [name] [name], ABRAMO [name]. 09/17/2022 10:15 A M Medical Record Number: [id number] Patient Account Number: [id number] Date of Birth/Sex: Treating RN: 22-Dec-1928 (89 y.o. Gevena Mart Primary Care Obrian Bulson: [place], Betty Other Clinician: Referring Corneshia Hines: Treating Orlinda Slomski/Extender: Hoffman, [name] [place], Betty Weeks in Treatment: 7 Visit Information History Since Last Visit All ordered tests and consults were completed: Yes Patient Arrived: [name] Added or deleted any medications: No Arrival Time: 10:26 Any new allergies or adverse reactions: No Accompanied By: daughter Had a fall or experienced change in No Transfer Assistance: None activities of daily living that may affect Patient Identification Verified: Yes risk of falls: Secondary Verification Process Completed: Yes Signs or symptoms of abuse/neglect since last visito No Patient Requires Transmission-Based Precautions: No Hospitalized since last visit: No Patient Has Alerts: No Implantable device outside of the clinic excluding No cellular tissue based products placed in the center since last visit: Has Dressing in Place as Prescribed: Yes Pain Present Now: No Electronic Signature(s) Signed: 09/18/2022 2:02:59 PM By: Brenton Grills Entered By: Brenton Grills on 09/17/2022 10:29:08 -------------------------------------------------------------------------------- Encounter Discharge Information Details Patient Name: Date of Service: [name] [name] Ngo [name]. 09/17/2022 10:15 A M Medical Record Number: [id number] Patient Account Number: [id number] Date of Birth/Sex: Treating RN: 1928-05-03 (89 y.o. Arta Silence Primary Care Lacreasha Hinds: [place], Betty Other Clinician: Referring Kaelob Persky: Treating Lexee Brashears/Extender: Hoffman, [name] [place], Betty Weeks in  Treatment: 7 Encounter Discharge Information Items Post Procedure Vitals Discharge Condition: Stable Temperature (F): 97.7 Ambulatory Status: Cane Pulse (bpm): 82 Discharge Destination: Home Respiratory Rate (breaths/min): 18 Transportation: Private Auto Blood Pressure (mmHg): 146/66 Accompanied By: daughter Schedule Follow-up Appointment: Yes Clinical Summary of Care: Electronic Signature(s) Signed: 09/18/2022 5:33:37 PM By: Shawn Stall RN, [name] Entered By: Shawn Stall on 09/17/2022 11:08:07 Knarr, Tannis [name] ([id number]) [id number] Page 2 of 8 -------------------------------------------------------------------------------- Lower Extremity Assessment Details Patient Name: Date of Service: [name] [name], OKUN [name]. 09/17/2022 10:15 A M Medical Record Number: [id number] Patient Account Number: [id number] Date of Birth/Sex: Treating RN: 06-28-1928 (89 y.o. Gevena Mart Primary Care Collyn Selk: [place], Betty Other Clinician: Referring Arretta Toenjes: Treating Kalle Bernath/Extender: Hoffman, [name] [place], Betty Weeks in Treatment: 7 Edema Assessment Assessed: [Left: No] [Right: Yes] [Left: Edema] [Right: :] Calf Left: Right: Point of Measurement: 27 cm From Medial Instep 27.2 cm Ankle Left: Right: Point of Measurement: 10 cm From Medial Instep 17.9 cm Vascular Assessment Pulses: Dorsalis Pedis Palpable: [Left:Yes] Extremity colors, hair growth, and conditions: Extremity Color: [Left:Hyperpigmented] Hair Growth on Extremity: [Left:No] Temperature of Extremity: [Left:Warm] Capillary Refill: [Left:< 3 seconds] Dependent Rubor: [Left:No No] Electronic Signature(s) Signed: 09/18/2022 2:02:59 PM By: Brenton Grills Entered By: Brenton Grills on 09/17/2022 10:36:56 -------------------------------------------------------------------------------- Multi Wound Chart Details Patient Name: Date of Service: [name] [name] Ngo [name]. 09/17/2022 10:15 A M Medical  Record Number: [id number] Patient Account Number: [id number] Date of Birth/Sex: Treating RN: May 14, 1928 (89 y.o. F) Primary Care Jackalynn Art: [place], Betty Other Clinician: Referring Kaelyn Innocent: Treating Lehua Flores/Extender: Hoffman, [name] [place], Betty Weeks in Treatment: 7 Vital Signs Height(in): 60 Pulse(bpm): 82 Weight(lbs): 97 Blood Pressure(mmHg): 146/66 Body Mass Index(BMI): 18.9 Temperature(F): 97.7 Respiratory Rate(breaths/min): 18 [4:Photos:] [N/A:N/A] Left, Anterior Lower Leg N/A N/A Wound Location: Skin [name] ear/Laceration N/A N/A Wounding Event: Trauma, Other N/A N/A Primary Etiology: Anemia, Hypertension, Osteoarthritis N/A N/A Comorbid History: 05/22/2022 N/A N/A Date Acquired: 7 N/A N/A Weeks of Treatment: Open N/A N/A Wound Status: No N/A  N/A Wound Recurrence: 2.1x0.7x0.1 N/A N/A Measurements L x W x D (cm) 1.155 N/A N/A A (cm) : rea 0.115 N/A N/A Volume (cm) : 92.70% N/A N/A % Reduction in A rea: 96.40% N/A N/A % Reduction in Volume: Full Thickness With Exposed Support N/A N/A Classification: Structures Medium N/A N/A Exudate A mount: Serosanguineous N/A N/A Exudate Type: red, brown N/A N/A Exudate Color: Distinct, outline attached N/A N/A Wound Margin: Small (1-33%) N/A N/A Granulation A mount: Red N/A N/A Granulation Quality: Large (67-100%) N/A N/A Necrotic A mount: Fat Layer (Subcutaneous Tissue): Yes N/A N/A Exposed Structures: Fascia: No Tendon: No Muscle: No Joint: No Bone: No None N/A N/A Epithelialization: Debridement - Excisional N/A N/A Debridement: Pre-procedure Verification/Time Out 10:58 N/A N/A Taken: Lidocaine 4% Topical Solution N/A N/A Pain Control: Subcutaneous, Slough N/A N/A Tissue Debrided: Skin/Subcutaneous Tissue N/A N/A Level: 0.81 N/A N/A Debridement A (sq cm): rea Curette N/A N/A Instrument: Minimum N/A N/A Bleeding: Pressure N/A N/A Hemostasis A chieved: 0 N/A N/A Procedural Pain: 0 N/A  N/A Post Procedural Pain: Procedure was tolerated well N/A N/A Debridement Treatment Response: 2.1x0.7x0.1 N/A N/A Post Debridement Measurements L x W x D (cm) 0.115 N/A N/A Post Debridement Volume: (cm) Excoriation: No N/A N/A Periwound Skin Texture: Induration: No Callus: No Crepitus: No Rash: No Scarring: No Maceration: No N/A N/A Periwound Skin Moisture: Dry/Scaly: No Hemosiderin Staining: Yes N/A N/A Periwound Skin Color: Atrophie Blanche: No Cyanosis: No Ecchymosis: No Erythema: No Mottled: No Pallor: No Rubor: No No Abnormality N/A N/A Temperature: Debridement N/A N/A Procedures Performed: Treatment Notes Wound #4 (Lower Leg) Wound Laterality: Left, Anterior Cleanser Soap and Water Discharge Instruction: May shower and wash wound with dial antibacterial soap and water prior to dressing change. Vashe 5.8 (oz) Discharge Instruction: Cleanse the wound with Vashe prior to applying a clean dressing using gauze sponges, not tissue or cotton balls. Peri-Wound Care Sween Lotion (Moisturizing lotion) Schue, Iveth [name] ([id number]) [id number] Page 4 of 8 Discharge Instruction: Apply moisturizing lotion as directed Topical Santyl Discharge Instruction: apply directly wound bed. Primary Dressing Hydrofera Blue Ready Transfer Foam, 4x5 (in/in) Discharge Instruction: Apply to wound bed as instructed Secondary Dressing Woven Gauze Sponge, Non-Sterile 4x4 in Discharge Instruction: Apply over primary dressing as directed. Secured With Compression Wrap Kerlix Roll 4.5x3.1 (in/yd) Discharge Instruction: Apply Kerlix and Coban compression as directed. Coban Self-Adherent Wrap 4x5 (in/yd) Discharge Instruction: Apply over Kerlix as directed. Compression Stockings Add-Ons Electronic Signature(s) Signed: 09/17/2022 1:35:31 PM By: Geralyn Corwin DO Entered By: Geralyn Corwin on 09/17/2022  11:25:09 -------------------------------------------------------------------------------- Multi-Disciplinary Care Plan Details Patient Name: Date of Service: [name] [name] Ngo [name]. 09/17/2022 10:15 A M Medical Record Number: [id number] Patient Account Number: [id number] Date of Birth/Sex: Treating RN: 1928/04/02 (89 y.o. Arta Silence Primary Care Kymoni Monday: [place], Betty Other Clinician: Referring Adelene Polivka: Treating Kyiesha Millward/Extender: Hoffman, [name] [place], Betty Weeks in Treatment: 7 Active Inactive Wound/Skin Impairment Nursing Diagnoses: Impaired tissue integrity Goals: Patient/caregiver will verbalize understanding of skin care regimen Date Initiated: 07/30/2022 Target Resolution Date: 10/04/2022 Goal Status: Active Interventions: Assess patient/caregiver ability to obtain necessary supplies Assess patient/caregiver ability to perform ulcer/skin care regimen upon admission and as needed Assess ulceration(s) every visit Provide education on ulcer and skin care Screen for HBO Treatment Activities: Skin care regimen initiated : 07/30/2022 Topical wound management initiated : 07/30/2022 Notes: NATAHSA, DANIELE [name] ([id number]) [phone].pdf Page 5 of 8 Electronic Signature(s) Signed: 09/18/2022 5:33:37 PM By: Shawn Stall RN, [name] Entered By: Shawn Stall on 09/17/2022 11:06:30 --------------------------------------------------------------------------------  Pain Assessment Details Patient Name: Date of Service: [name] TAMMY, SAYNE [name]. 09/17/2022 10:15 A M Medical Record Number: [id number] Patient Account Number: [id number] Date of Birth/Sex: Treating RN: 11/16/1928 (89 y.o. Gevena Mart Primary Care Kissa Campoy: [place], Betty Other Clinician: Referring Lacey Wallman: Treating Aryah Doering/Extender: Hoffman, [name] [place], Betty Weeks in Treatment: 7 Active Problems Location of Pain Severity and Description of Pain Patient Has Paino No Site Locations Pain  Management and Medication Current Pain Management: Electronic Signature(s) Signed: 09/18/2022 2:02:59 PM By: Brenton Grills Entered By: Brenton Grills on 09/17/2022 10:35:18 -------------------------------------------------------------------------------- Patient/Caregiver Education Details Patient Name: Date of Service: [name] Ezzard Standing 8/26/2024andnbsp10:15 A M Medical Record Number: [id number] Patient Account Number: [id number] Date of Birth/Gender: Treating RN: [birth year] (89 y.o. Arta Silence Primary Care Physician: [place], Betty Other Clinician: Referring Physician: Treating Physician/Extender: Hoffman, [name] [place], Betty Weeks in Treatment: 7 Education Assessment Education Provided To: Patient GETHSEMANE, HERT [name] ([id number]) [id number] Page 6 of 8 Education Topics Provided Wound/Skin Impairment: Handouts: Caring for Your Ulcer Methods: Explain/Verbal Responses: Reinforcements needed Electronic Signature(s) Signed: 09/18/2022 5:33:37 PM By: Shawn Stall RN, [name] Entered By: Shawn Stall on 09/17/2022 11:06:58 -------------------------------------------------------------------------------- Wound Assessment Details Patient Name: Date of Service: [name] [name] Ngo [name]. 09/17/2022 10:15 A M Medical Record Number: [id number] Patient Account Number: [id number] Date of Birth/Sex: Treating RN: 10-Jun-1928 (89 y.o. Gevena Mart Primary Care Berna Gitto: [place], Betty Other Clinician: Referring Jamita Mckelvin: Treating Marchelle Rinella/Extender: Hoffman, [name] [place], Betty Weeks in Treatment: 7 Wound Status Wound Number: 4 Primary Etiology: Trauma, Other Wound Location: Left, Anterior Lower Leg Wound Status: Open Wounding Event: Skin Tear/Laceration Comorbid History: Anemia, Hypertension, Osteoarthritis Date Acquired: 05/22/2022 Weeks Of Treatment: 7 Clustered Wound: No Photos Wound Measurements Length: (cm) 2.1 Width: (cm) 0.7 Depth: (cm)  0.1 Area: (cm) 1.155 Volume: (cm) 0.115 % Reduction in Area: 92.7% % Reduction in Volume: 96.4% Epithelialization: None Tunneling: No Undermining: No Wound Description Classification: Full Thickness With Exposed Suppor Wound Margin: Distinct, outline attached Exudate Amount: Medium Exudate Type: Serosanguineous Exudate Color: red, brown [name] Structures Foul Odor After Cleansing: No Slough/Fibrino Yes Wound Bed Granulation Amount: Small (1-33%) Exposed Structure Granulation Quality: Red Fascia Exposed: No Necrotic Amount: Large (67-100%) Fat Layer (Subcutaneous Tissue) Exposed: Yes Necrotic Quality: Adherent Slough Tendon Exposed: No Muscle Exposed: No Joint Exposed: No Bone Exposed: No Erway, Danashia [name] ([id number]) [id number] Page 7 of 8 Periwound Skin Texture Texture Color No Abnormalities Noted: No No Abnormalities Noted: No Callus: No Atrophie Blanche: No Crepitus: No Cyanosis: No Excoriation: No Ecchymosis: No Induration: No Erythema: No Rash: No Hemosiderin Staining: Yes Scarring: No Mottled: No Pallor: No Moisture Rubor: No No Abnormalities Noted: No Dry / Scaly: No Temperature / Pain Maceration: No Temperature: No Abnormality Treatment Notes Wound #4 (Lower Leg) Wound Laterality: Left, Anterior Cleanser Soap and Water Discharge Instruction: May shower and wash wound with dial antibacterial soap and water prior to dressing change. Vashe 5.8 (oz) Discharge Instruction: Cleanse the wound with Vashe prior to applying a clean dressing using gauze sponges, not tissue or cotton balls. Peri-Wound Care Sween Lotion (Moisturizing lotion) Discharge Instruction: Apply moisturizing lotion as directed Topical Santyl Discharge Instruction: apply directly wound bed. Primary Dressing Hydrofera Blue Ready Transfer Foam, 4x5 (in/in) Discharge Instruction: Apply to wound bed as instructed Secondary Dressing Woven Gauze Sponge,  Non-Sterile 4x4 in Discharge Instruction: Apply over primary dressing as directed. Secured With Compression Wrap Kerlix Roll 4.5x3.1 (in/yd) Discharge Instruction: Apply Kerlix and Coban compression as directed. Coban Self-Adherent Wrap 4x5 (in/yd) Discharge  Instruction: Apply over Kerlix as directed. Compression Stockings Add-Ons Electronic Signature(s) Signed: 09/18/2022 2:02:59 PM By: Brenton Grills Entered By: Brenton Grills on 09/17/2022 10:45:33 -------------------------------------------------------------------------------- Vitals Details Patient Name: Date of Service: [name] [name] Ngo [name]. 09/17/2022 10:15 A M Medical Record Number: [id number] Patient Account Number: [id number] Date of Birth/Sex: Treating RN: April 20, 1928 (89 y.o. Gevena Mart Primary Care Hayla Hinger: [place], Betty Other Clinician: Referring Authur Cubit: Treating Danyelle Brookover/Extender: Hoffman, [name] [place], Betty Weeks in Treatment: 7 Guimond, Isobella [name] ([id number]) [id number] Page 8 of 8 Vital Signs Time Taken: 10:29 Temperature (F): 97.7 Height (in): 60 Pulse (bpm): 82 Weight (lbs): 97 Respiratory Rate (breaths/min): 18 Body Mass Index (BMI): 18.9 Blood Pressure (mmHg): 146/66 Reference Range: 80 - 120 mg / dl Electronic Signature(s) Signed: 09/18/2022 2:02:59 PM By: Brenton Grills Entered By: Brenton Grills on 09/17/2022 10:35:04

## 2022-09-19 NOTE — Progress Notes (Signed)
[name], [name] ([id number]) [id number] Page 1 of 10 Visit Report for 09/17/2022 Chief Complaint Document Details Patient Name: Date of Service: [name], [name]. 09/17/2022 10:15 A M Medical Record Number: [id number] Patient Account Number: [id number] Date of Birth/Sex: Treating RN: February 19, 1928 (89 y.o. F) Primary Care Provider: [place], Betty Other Clinician: Referring Provider: Treating Provider/Extender: Rayfield Beem [place], Betty Weeks in Treatment: 7 Information Obtained from: Patient Chief Complaint 07/30/2022; left lower extremity wound secondary to trauma Electronic Signature(s) Signed: 09/17/2022 1:35:31 PM By: Geralyn Corwin DO Entered By: Geralyn Corwin on 09/17/2022 11:25:19 -------------------------------------------------------------------------------- Debridement Details Patient Name: Date of Service: [name]. 09/17/2022 10:15 A M Medical Record Number: [id number] Patient Account Number: [id number] Date of Birth/Sex: Treating RN: 1928-05-13 (89 y.o. [name], [name] Primary Care Provider: [place], Betty Other Clinician: Referring Provider: Treating Provider/Extender: Anneka Studer [place], Betty Weeks in Treatment: 7 Debridement Performed for Assessment: Wound #4 Left,Anterior Lower Leg Performed By: Physician Geralyn Corwin, DO Debridement Type: Debridement Level of Consciousness (Pre-procedure): Awake and Alert Pre-procedure Verification/Time Out Yes - 10:58 Taken: Start Time: 10:59 Pain Control: Lidocaine 4% [name] opical Solution Percent of Wound Bed Debrided: 70% [name] Area Debrided (cm): otal 0.81 Tissue and other material debrided: Viable, Non-Viable, Slough, Subcutaneous, Skin: Dermis , Skin: Epidermis, Slough Level: Skin/Subcutaneous Tissue Debridement Description: Excisional Instrument: Curette Bleeding: Minimum Hemostasis Achieved: Pressure End Time: 11:03 Procedural Pain: 0 Post Procedural Pain:  0 Response to Treatment: Procedure was tolerated well Level of Consciousness (Post- Awake and Alert procedure): Post Debridement Measurements of Total Wound Length: (cm) 2.1 Width: (cm) 0.7 Depth: (cm) 0.1 Volume: (cm) 0.115 Character of Wound/Ulcer Post Debridement: Improved [name], [name] ([id number]) [id number] Page 2 of 10 Post Procedure Diagnosis Same as Pre-procedure Electronic Signature(s) Signed: 09/17/2022 1:35:31 PM By: Geralyn Corwin DO Signed: 09/18/2022 5:33:37 PM By: Shawn Stall RN, [name] Entered By: Shawn Stall on 09/17/2022 11:04:15 -------------------------------------------------------------------------------- HPI Details Patient Name: Date of Service: [name], [name]. 09/17/2022 10:15 A M Medical Record Number: [id number] Patient Account Number: [id number] Date of Birth/Sex: Treating RN: [birth year] (89 y.o. F) Primary Care Provider: [place], Betty Other Clinician: Referring Provider: Treating Provider/Extender: Kyaire Gruenewald [place], Betty Weeks in Treatment: 7 History of Present Illness HPI Description: ADMISSION 10/06/2018 This is an 89 year old woman who is here for wounds on her bilateral anterior lower legs. She was previously seen in early 2017 with a wound on her left leg that was traumatic. Felt to have lymphedema and venous insufficiency at that time although she had a reflux study that did not show significant superficial reflux in the left lower extremity. This healed. At some point she has had stockings prescribed but she cannot get the standard stocking on. The patient is also on chronic prednisone I think related to polymyalgia rheumatica currently at 2.5 mg daily. The patient states that in July she slipped out of bed traumatizing the anterior part of her leg. She was seen by her primary care physicians beginning on 08/14/2018. It was noted to have some erythema and swelling she was given Keflex. On 7/30 she  had doxycycline and I think he has had perhaps another round of doxycycline after this. Noted in their notes to have a history of chronic venous insufficiency and some edema. Sometime in August she traumatized her left anterior leg with the tip of her cane and has an open wound in this area as well. She has not been doing anything specific to the wounds themselves leaving them open to  air and putting Band-Aids on top. Past medical history; hypertension, polymyalgia rheumatica on chronic prednisone, history of microdiscectomy, B12 deficiency, vitamin D deficiency, emphysema, osteoporosis ABIs in our clinic were 0.96 on the right and 0.99 on the left. 9/22; 2 small wounds on the left anterior and right anterior tibial area. These are better than last week. We put her in 3 layer compression unfortunately she appears to have a new wound on the medial right lower leg anteriorly probably because of wrap injury. She is on chronic prednisone with a history of chronic polymyalgia rheumatica and has very frail skin on her lower extremities 9/29; 2 small wounds on the left anterior and right anterior tibial areas these continue to improve. The abrasion she had from last week appears to have closed over. 10/6; the patient had 2 wounds on the left anterior and right anterior tibial wounds. The left anterior wound is just about closed. Right anterior tibial is still open. 10/13; the patient's leg is healed on the left as predicted. The area on the right is still open but measuring smaller 10/20; the patient comes in today with a small area on the left medial lower leg. The area on the right is still open but measuring smaller. We had put her in a dual layer stocking last week on the left she is not able to get the 2 layers on she will only wear 1 layer. 10/27; the left medial lower leg is closed. The right one is just about closed. We continue to dress the right leg and compression we put her 11/5; the left  medial leg is closed and she has a juxta lite stocking on although her daughter had to apply it. The right one is closed today and we will put a juxta lite stocking on this leg as well. The patient states most of the skin changes are due to chronic steroid use. [name] the point this is true but she has tightly fibrotic o skin in her distal lower extremities suggesting some degree of chronic venous insufficiency as well. She does not have a lot of edema in her leg 07/30/2022 Ms. Bobby Murrah is a 89 year old female with a past medical history of essential hypertension, polymyalgia rheumatica on chronic steroids, and venous insufficiency that presents to the clinic for a 9-week history of nonhealing wound to the left lower extremities. She states she hit her leg against a table and developed a hematoma that eventually opened and drained. She has been using hydrogen peroxide to the wound bed. She currently denies signs of infection. She has not been on antibiotics. She has compression stockings at home but has difficulty putting these on. She states she cannot even put on a sock. 7/15; patient presents for follow-up. She has been using Vashe wet-to-dry dressings although technique is questionable. She has left this open to air for several days because she did not think she had the right gauze to use for the wet-to-dry dressing. 7/22; patient presents for follow-up. She has been using Vashe wet-to-dry dressings. More healthy granulation tissue present today. She had ABIs completed that showed an ABI of 1.07 on the left with biphasic waveforms throughout the system. 7/29; patient presents for follow-up. We have been using Santyl and Hydrofera Blue under Kerlix/Coban. She has tolerated this compression wrap well. Wound is smaller. 8/5; patient presents for follow-up. We have been using Santyl and Hydrofera Blue under Kerlix/Coban. The wound is smaller. We discussed potentially doing a skin substitute and  patient was  agreeable to have insurance verification done for this. 8/12; patient presents for follow-up. We have been doing Hydrofera Blue with Santyl under Kerlix/Coban. Wound is smaller today. Due to out-of-pocket cost patient has declined skin substitute placement. 8/19; patient presents for follow-up. We have been doing Hydrofera Blue and Santyl under Kerlix/Coban. Wound again is smaller. [name], [name] ([id number]) [id number] Page 3 of 10 8/26; patient presents for follow-up. We have been using Hydrofera Blue and Santyl under Kerlix/Coban. Wound is smaller. There is an [place] of epithelization now dividing the wound into 2 smaller ones. Electronic Signature(s) Signed: 09/17/2022 1:35:31 PM By: Geralyn Corwin DO Entered By: Geralyn Corwin on 09/17/2022 11:25:53 -------------------------------------------------------------------------------- Physical Exam Details Patient Name: Date of Service: [name]. 09/17/2022 10:15 A M Medical Record Number: [id number] Patient Account Number: [id number] Date of Birth/Sex: Treating RN: 1928/01/29 (89 y.o. F) Primary Care Provider: [place], Betty Other Clinician: Referring Provider: Treating Provider/Extender: Arlyn Bumpus [place], Betty Weeks in Treatment: 7 Constitutional respirations regular, non-labored and within target range for patient.. Cardiovascular 2+ dorsalis pedis/posterior tibialis pulses. Psychiatric pleasant and cooperative. Notes Left lower extremity: [name] the anterior aspect there is an open wound with granulation tissue and nonviable tissue. No surrounding signs of infection. Venous o stasis dermatitis noted throughout the leg. Electronic Signature(s) Signed: 09/17/2022 1:35:31 PM By: Geralyn Corwin DO Entered By: Geralyn Corwin on 09/17/2022 11:26:23 -------------------------------------------------------------------------------- Physician Orders Details Patient Name:  Date of Service: [name]. 09/17/2022 10:15 A M Medical Record Number: [id number] Patient Account Number: [id number] Date of Birth/Sex: Treating RN: March 07, 1928 (89 y.o. Arta Silence Primary Care Provider: [place], Betty Other Clinician: Referring Provider: Treating Provider/Extender: Dominika Losey [place], Betty Weeks in Treatment: 7 Verbal / Phone Orders: No Diagnosis Coding ICD-10 Coding Code Description T79.8XXA Other early complications of trauma, initial encounter L97.822 Non-pressure chronic ulcer of other part of left lower leg with fat layer exposed I87.312 Chronic venous hypertension (idiopathic) with ulcer of left lower extremity M35.3 Polymyalgia rheumatica Z92.241 Personal history of systemic steroid therapy Follow-up Appointments ppointment in 1 week. - Dr. Mikey Bussing - room 8 or 9 9/9 at 10:15am. (already schedule) Return A Return Appointment in 2 weeks. [name], [name] ([id number]) [id number] Page 4 of 10 Return appointment in 3 weeks. - Dr. Mikey Bussing room 8 or 9 please schedule for patient***** Anesthetic (In clinic) Topical Lidocaine 4% applied to wound bed Cellular or Tissue Based Products Other Cellular or Tissue Based Products Orders/Instructions: - Run IVR for Apligraf and Puraply. Approved, but patient opted not to have it. Bathing/ Shower/ Hygiene May shower and wash wound with soap and water. Edema Control - Lymphedema / SCD / Other Bilateral Lower Extremities Elevate legs to the level of the heart or above for 30 minutes daily and/or when sitting for 3-4 times a day throughout the day. Avoid standing for long periods of time. Wound Treatment Wound #4 - Lower Leg Wound Laterality: Left, Anterior Cleanser: Soap and Water 1 x Per Week/30 Days Discharge Instructions: May shower and wash wound with dial antibacterial soap and water prior to dressing change. Cleanser: Vashe 5.8 (oz) 1 x Per Week/30 Days Discharge  Instructions: Cleanse the wound with Vashe prior to applying a clean dressing using gauze sponges, not tissue or cotton balls. Peri-Wound Care: Sween Lotion (Moisturizing lotion) 1 x Per Week/30 Days Discharge Instructions: Apply moisturizing lotion as directed Topical: Santyl 1 x Per Week/30 Days Discharge Instructions: apply directly wound bed. Prim Dressing: Hydrofera Blue Ready Transfer Foam, 4x5 (in/in)  1 x Per Week/30 Days ary Discharge Instructions: Apply to wound bed as instructed Secondary Dressing: Woven Gauze Sponge, Non-Sterile 4x4 in 1 x Per Week/30 Days Discharge Instructions: Apply over primary dressing as directed. Compression Wrap: Kerlix Roll 4.5x3.1 (in/yd) (Generic) 1 x Per Week/30 Days Discharge Instructions: Apply Kerlix and Coban compression as directed. Compression Wrap: Coban Self-Adherent Wrap 4x5 (in/yd) 1 x Per Week/30 Days Discharge Instructions: Apply over Kerlix as directed. Electronic Signature(s) Signed: 09/17/2022 1:35:31 PM By: Geralyn Corwin DO Entered By: Geralyn Corwin on 09/17/2022 11:28:02 -------------------------------------------------------------------------------- Problem List Details Patient Name: Date of Service: [name]. 09/17/2022 10:15 A M Medical Record Number: [id number] Patient Account Number: [id number] Date of Birth/Sex: Treating RN: [birth year] (89 y.o. Arta Silence Primary Care Provider: [place], Betty Other Clinician: Referring Provider: Treating Provider/Extender: Gilda Abboud [place], Betty Weeks in Treatment: 7 Active Problems ICD-10 Encounter Code Description Active Date MDM Diagnosis T79.8XXA Other early complications of trauma, initial encounter 07/30/2022 No Yes L97.822 Non-pressure chronic ulcer of other part of left lower leg with fat layer exposed7/08/2022 No Yes [name], [name] ([id number]) [phone].pdf Page 5 of 10 [phone] Chronic venous hypertension (idiopathic)  with ulcer of left lower extremity 07/30/2022 No Yes M35.3 Polymyalgia rheumatica 07/30/2022 No Yes Z92.241 Personal history of systemic steroid therapy 07/30/2022 No Yes Inactive Problems Resolved Problems Electronic Signature(s) Signed: 09/17/2022 1:35:31 PM By: Geralyn Corwin DO Entered By: Geralyn Corwin on 09/17/2022 11:25:01 -------------------------------------------------------------------------------- Progress Note Details Patient Name: Date of Service: [name]. 09/17/2022 10:15 A M Medical Record Number: [id number] Patient Account Number: [id number] Date of Birth/Sex: Treating RN: 1928-05-17 (89 y.o. F) Primary Care Provider: [place], Betty Other Clinician: Referring Provider: Treating Provider/Extender: Neeley Sedivy [place], Betty Weeks in Treatment: 7 Subjective Chief Complaint Information obtained from Patient 07/30/2022; left lower extremity wound secondary to trauma History of Present Illness (HPI) ADMISSION 10/06/2018 This is an 89 year old woman who is here for wounds on her bilateral anterior lower legs. She was previously seen in early 2017 with a wound on her left leg that was traumatic. Felt to have lymphedema and venous insufficiency at that time although she had a reflux study that did not show significant superficial reflux in the left lower extremity. This healed. At some point she has had stockings prescribed but she cannot get the standard stocking on. The patient is also on chronic prednisone I think related to polymyalgia rheumatica currently at 2.5 mg daily. The patient states that in July she slipped out of bed traumatizing the anterior part of her leg. She was seen by her primary care physicians beginning on 08/14/2018. It was noted to have some erythema and swelling she was given Keflex. On 7/30 she had doxycycline and I think he has had perhaps another round of doxycycline after this. Noted in their notes to have a history of chronic venous  insufficiency and some edema. Sometime in August she traumatized her left anterior leg with the tip of her cane and has an open wound in this area as well. She has not been doing anything specific to the wounds themselves leaving them open to air and putting Band-Aids on top. Past medical history; hypertension, polymyalgia rheumatica on chronic prednisone, history of microdiscectomy, B12 deficiency, vitamin D deficiency, emphysema, osteoporosis ABIs in our clinic were 0.96 on the right and 0.99 on the left. 9/22; 2 small wounds on the left anterior and right anterior tibial area. These are better than last week. We put her in 3 layer  compression unfortunately she appears to have a new wound on the medial right lower leg anteriorly probably because of wrap injury. She is on chronic prednisone with a history of chronic polymyalgia rheumatica and has very frail skin on her lower extremities 9/29; 2 small wounds on the left anterior and right anterior tibial areas these continue to improve. The abrasion she had from last week appears to have closed over. 10/6; the patient had 2 wounds on the left anterior and right anterior tibial wounds. The left anterior wound is just about closed. Right anterior tibial is still open. 10/13; the patient's leg is healed on the left as predicted. The area on the right is still open but measuring smaller 10/20; the patient comes in today with a small area on the left medial lower leg. The area on the right is still open but measuring smaller. We had put her in a dual layer stocking last week on the left she is not able to get the 2 layers on she will only wear 1 layer. 10/27; the left medial lower leg is closed. The right one is just about closed. We continue to dress the right leg and compression we put her 11/5; the left medial leg is closed and she has a juxta lite stocking on although her daughter had to apply it. The right one is closed today and we will put a  juxta lite stocking on this leg as well. The patient states most of the skin changes are due to chronic steroid use. [name] the point this is true but she has tightly fibrotic o skin in her distal lower extremities suggesting some degree of chronic venous insufficiency as well. She does not have a lot of edema in her leg [name], [name] ([id number]) [phone].pdf Page 6 of 10 07/30/2022 Ms. Mersadie Higashi is a 89 year old female with a past medical history of essential hypertension, polymyalgia rheumatica on chronic steroids, and venous insufficiency that presents to the clinic for a 9-week history of nonhealing wound to the left lower extremities. She states she hit her leg against a table and developed a hematoma that eventually opened and drained. She has been using hydrogen peroxide to the wound bed. She currently denies signs of infection. She has not been on antibiotics. She has compression stockings at home but has difficulty putting these on. She states she cannot even put on a sock. 7/15; patient presents for follow-up. She has been using Vashe wet-to-dry dressings although technique is questionable. She has left this open to air for several days because she did not think she had the right gauze to use for the wet-to-dry dressing. 7/22; patient presents for follow-up. She has been using Vashe wet-to-dry dressings. More healthy granulation tissue present today. She had ABIs completed that showed an ABI of 1.07 on the left with biphasic waveforms throughout the system. 7/29; patient presents for follow-up. We have been using Santyl and Hydrofera Blue under Kerlix/Coban. She has tolerated this compression wrap well. Wound is smaller. 8/5; patient presents for follow-up. We have been using Santyl and Hydrofera Blue under Kerlix/Coban. The wound is smaller. We discussed potentially doing a skin substitute and patient was agreeable to have insurance verification done  for this. 8/12; patient presents for follow-up. We have been doing Hydrofera Blue with Santyl under Kerlix/Coban. Wound is smaller today. Due to out-of-pocket cost patient has declined skin substitute placement. 8/19; patient presents for follow-up. We have been doing Hydrofera Blue and Santyl under Kerlix/Coban. Wound again is  smaller. 8/26; patient presents for follow-up. We have been using Hydrofera Blue and Santyl under Kerlix/Coban. Wound is smaller. There is an [place] of epithelization now dividing the wound into 2 smaller ones. Patient History Information obtained from Patient. Family History Cancer - Father, Heart Disease - Father, Lung Disease - Father, No family history of Diabetes, Hereditary Spherocytosis, Hypertension, Seizures, Stroke, Thyroid Problems, Tuberculosis. Social History Former smoker, Marital Status - Widowed, Alcohol Use - Moderate, Drug Use - No History, Caffeine Use - Moderate. Medical History Eyes Denies history of Cataracts, Glaucoma, Optic Neuritis Ear/Nose/Mouth/Throat Denies history of Chronic sinus problems/congestion, Middle ear problems Hematologic/Lymphatic Patient has history of Anemia Denies history of Hemophilia, Human Immunodeficiency Virus, Lymphedema, Sickle Cell Disease Respiratory Denies history of Aspiration, Asthma, Chronic Obstructive Pulmonary Disease (COPD), Pneumothorax, Sleep Apnea, Tuberculosis Cardiovascular Patient has history of Hypertension Denies history of Angina, Arrhythmia, Congestive Heart Failure, Coronary Artery Disease, Deep Vein Thrombosis, Hypotension, Myocardial Infarction, Peripheral Arterial Disease, Peripheral Venous Disease, Phlebitis, Vasculitis Gastrointestinal Denies history of Cirrhosis , Colitis, Crohns, Hepatitis A, Hepatitis B, Hepatitis C Endocrine Denies history of Type I Diabetes, Type II Diabetes Genitourinary Denies history of End Stage Renal Disease Immunological Denies history of Lupus  Erythematosus, Raynauds, Scleroderma Integumentary (Skin) Denies history of History of Burn Musculoskeletal Patient has history of Osteoarthritis Denies history of Gout, Rheumatoid Arthritis, Osteomyelitis Neurologic Denies history of Dementia, Neuropathy, Quadriplegia, Paraplegia, Seizure Disorder Oncologic Denies history of Received Chemotherapy, Received Radiation Psychiatric Denies history of Anorexia/bulimia, Confinement Anxiety Objective Constitutional respirations regular, non-labored and within target range for patient.. Vitals Time Taken: 10:29 AM, Height: 60 in, Weight: 97 lbs, BMI: 18.9, Temperature: 97.7 F, Pulse: 82 bpm, Respiratory Rate: 18 breaths/min, Blood Pressure: 146/66 mmHg. [name], [name] ([id number]) [id number] Page 7 of 10 Cardiovascular 2+ dorsalis pedis/posterior tibialis pulses. Psychiatric pleasant and cooperative. General Notes: Left lower extremity: [name] the anterior aspect there is an open wound with granulation tissue and nonviable tissue. No surrounding signs of o infection. Venous stasis dermatitis noted throughout the leg. Integumentary (Hair, Skin) Wound #4 status is Open. Original cause of wound was Skin [name] ear/Laceration. The date acquired was: 05/22/2022. The wound has been in treatment 7 weeks. The wound is located on the Left,Anterior Lower Leg. The wound measures 2.1cm length x 0.7cm width x 0.1cm depth; 1.155cm^2 area and 0.115cm^3 volume. There is Fat Layer (Subcutaneous Tissue) exposed. There is no tunneling or undermining noted. There is a medium amount of serosanguineous drainage noted. The wound margin is distinct with the outline attached to the wound base. There is small (1-33%) **red granulation within the wound bed. There is a large (67- 100%) amount of necrotic tissue within the wound bed including Adherent Slough. The periwound skin appearance exhibited: Hemosiderin Staining. The periwound skin  appearance did not exhibit: Callus, Crepitus, Excoriation, Induration, Rash, Scarring, Dry/Scaly, Maceration, Atrophie Blanche, Cyanosis, Ecchymosis, Mottled, Pallor, Rubor, Erythema. Periwound temperature was noted as No Abnormality. 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 I debrided nonviable tissue. I recommended continue the course with Santyl and Hydrofera Blue under Kerlix/Coban. Follow-up in 1 week. Procedures Wound #4 Pre-procedure diagnosis of Wound #4 is a Trauma, Other located on the Left,Anterior Lower Leg . There was a Excisional  Skin/Subcutaneous Tissue Debridement with a total area of 0.81 sq cm performed by Geralyn Corwin, DO. With the following instrument(s): Curette to remove Viable and Non-Viable tissue/material. Material removed includes Subcutaneous Tissue, Slough, Skin: Dermis, and Skin: Epidermis after achieving pain control using Lidocaine 4% [name] opical Solution. A time out was conducted at 10:58, prior to the start of the procedure. A Minimum amount of bleeding was controlled with Pressure. The procedure was tolerated well with a pain level of 0 throughout and a pain level of 0 following the procedure. Post Debridement Measurements: 2.1cm length x 0.7cm width x 0.1cm depth; 0.115cm^3 volume. Character of Wound/Ulcer Post Debridement is improved. Post procedure Diagnosis Wound #4: Same as Pre-Procedure Plan Follow-up Appointments: Return Appointment in 1 week. - Dr. Mikey Bussing - room 8 or 9 9/9 at 10:15am. (already schedule) Return Appointment in 2 weeks. Return appointment in 3 weeks. - Dr. Mikey Bussing room 8 or 9  please schedule for patient***** Anesthetic: (In clinic) Topical Lidocaine 4% applied to wound bed Cellular or Tissue Based Products: Other Cellular or Tissue Based Products Orders/Instructions: - Run IVR for Apligraf and Puraply. Approved, but patient opted not to have it. Bathing/ Shower/ Hygiene: May shower and wash wound with soap and water. Edema Control - Lymphedema / SCD / Other: Elevate legs to the level of the heart or above for 30 minutes daily and/or when sitting for 3-4 times a day throughout the day. Avoid standing for long periods of time. WOUND #4: - Lower Leg Wound Laterality: Left, Anterior Cleanser: Soap and Water 1 x Per Week/30 Days Discharge Instructions: May shower and wash wound with dial antibacterial soap and water prior to dressing change. Cleanser: Vashe 5.8 (oz) 1 x Per Week/30 Days Discharge Instructions: Cleanse the wound with Vashe prior to applying a clean dressing using gauze sponges, not tissue or cotton balls. Peri-Wound Care: Sween Lotion (Moisturizing lotion) 1 x Per Week/30 Days Discharge Instructions: Apply moisturizing lotion as directed Topical: Santyl 1 x Per Week/30 Days Discharge Instructions: apply directly wound bed. Prim Dressing: Hydrofera Blue Ready Transfer Foam, 4x5 (in/in) 1 x Per Week/30 Days ary Discharge Instructions: Apply to wound bed as instructed Secondary Dressing: Woven Gauze Sponge, Non-Sterile 4x4 in 1 x Per Week/30 Days Discharge Instructions: Apply over primary dressing as directed. Com pression Wrap: Kerlix Roll 4.5x3.1 (in/yd) (Generic) 1 x Per Week/30 Days [name], [name] ([id number]) [id number] Page 8 of 10 Discharge Instructions: Apply Kerlix and Coban compression as directed. Compression Wrap: Coban Self-Adherent Wrap 4x5 (in/yd) 1 x Per Week/30 Days Discharge Instructions: Apply over Kerlix as directed. 1. In office sharp debridement 2. Hydrofera Blue and Santyl under  Kerlix/Cobanleft lower extremity 3. Follow-up in 1 week Electronic Signature(s) Signed: 09/17/2022 1:35:31 PM By: Geralyn Corwin DO Entered By: Geralyn Corwin on 09/17/2022 11:28:42 -------------------------------------------------------------------------------- HxROS Details Patient Name: Date of Service: [name]. 09/17/2022 10:15 A M Medical Record Number: [id number] Patient Account Number: [id number] Date of Birth/Sex: Treating RN: November 08, 1928 (89 y.o. F) Primary Care Provider: [place], Betty Other Clinician: Referring Provider: Treating Provider/Extender: Karren Newland [place], Betty Weeks in Treatment: 7 Information Obtained From Patient Eyes Medical History: Negative for: Cataracts; Glaucoma; Optic Neuritis Ear/Nose/Mouth/Throat Medical History: Negative for: Chronic sinus problems/congestion; Middle ear problems Hematologic/Lymphatic Medical History: Positive for: Anemia Negative for: Hemophilia; Human Immunodeficiency Virus; Lymphedema; Sickle Cell Disease Respiratory Medical History: Negative for: Aspiration; Asthma; Chronic Obstructive Pulmonary Disease (COPD); Pneumothorax; Sleep Apnea; Tuberculosis Cardiovascular Medical History: Positive for: Hypertension Negative for: Angina; Arrhythmia; Congestive Heart  Failure; Coronary Artery Disease; Deep Vein Thrombosis; Hypotension; Myocardial Infarction; Peripheral Arterial Disease; Peripheral Venous Disease; Phlebitis; Vasculitis Gastrointestinal Medical History: Negative for: Cirrhosis ; Colitis; Crohns; Hepatitis A; Hepatitis B; Hepatitis C Endocrine Medical History: Negative for: Type I Diabetes; Type II Diabetes Genitourinary Medical History: Negative for: End Stage Renal Disease [name], [name] ([id number]) [id number] Page 9 of 10 Immunological Medical History: Negative for: Lupus Erythematosus; Raynauds; Scleroderma Integumentary (Skin) Medical  History: Negative for: History of Burn Musculoskeletal Medical History: Positive for: Osteoarthritis Negative for: Gout; Rheumatoid Arthritis; Osteomyelitis Neurologic Medical History: Negative for: Dementia; Neuropathy; Quadriplegia; Paraplegia; Seizure Disorder Oncologic Medical History: Negative for: Received Chemotherapy; Received Radiation Psychiatric Medical History: Negative for: Anorexia/bulimia; Confinement Anxiety Immunizations Pneumococcal Vaccine: Received Pneumococcal Vaccination: No Implantable Devices None Family and Social History Cancer: Yes - Father; Diabetes: No; Heart Disease: Yes - Father; Hereditary Spherocytosis: No; Hypertension: No; Lung Disease: Yes - Father; Seizures: No; Stroke: No; Thyroid Problems: No; Tuberculosis: No; Former smoker; Marital Status - Widowed; Alcohol Use: Moderate; Drug Use: No History; Caffeine Use: Moderate; Financial Concerns: No; Food, Clothing or Shelter Needs: No; Support System Lacking: No; Transportation Concerns: No Electronic Signature(s) Signed: 09/17/2022 1:35:31 PM By: Geralyn Corwin DO Entered By: Geralyn Corwin on 09/17/2022 11:26:00 -------------------------------------------------------------------------------- SuperBill Details Patient Name: Date of Service: [name]. 09/17/2022 Medical Record Number: [id number] Patient Account Number: [id number] Date of Birth/Sex: Treating RN: 10/04/1928 (89 y.o. Arta Silence Primary Care Provider: [place], Betty Other Clinician: Referring Provider: Treating Provider/Extender: Kennetha Pearman [place], Betty Weeks in Treatment: 7 Diagnosis Coding ICD-10 Codes Code Description T79.8XXA Other early complications of trauma, initial encounter L97.822 Non-pressure chronic ulcer of other part of left lower leg with fat layer exposed I87.312 Chronic venous hypertension (idiopathic) with ulcer of left lower extremity M35.3 Polymyalgia rheumatica Z92.241 Personal  history of systemic steroid therapy Beaubien, Devika [name] ([id number]) [id number] Page 10 of 10 Facility Procedures : CPT4 Code: [id number] Description: 11042 - DEB SUBQ TISSUE 20 SQ CM/< ICD-10 Diagnosis Description L97.822 Non-pressure chronic ulcer of other part of left lower leg with fat layer expo I87.312 Chronic venous hypertension (idiopathic) with ulcer of left lower extremity Modifier: sed Quantity: 1 Physician Procedures : CPT4 Code Description Modifier [id number] 11042 - WC PHYS SUBQ TISS 20 SQ CM ICD-10 Diagnosis Description L97.822 Non-pressure chronic ulcer of other part of left lower leg with fat layer exposed I87.312 Chronic venous hypertension (idiopathic) with ulcer  of left lower extremity Quantity: 1 Electronic Signature(s) Signed: 09/17/2022 1:35:31 PM By: Geralyn Corwin DO Entered By: Geralyn Corwin on 09/17/2022 11:29:01

## 2022-09-25 ENCOUNTER — Encounter (HOSPITAL_BASED_OUTPATIENT_CLINIC_OR_DEPARTMENT_OTHER): Payer: Medicare Other | Attending: Internal Medicine | Admitting: Internal Medicine

## 2022-09-25 DIAGNOSIS — L97822 Non-pressure chronic ulcer of other part of left lower leg with fat layer exposed: Secondary | ICD-10-CM | POA: Insufficient documentation

## 2022-09-25 DIAGNOSIS — M353 Polymyalgia rheumatica: Secondary | ICD-10-CM | POA: Diagnosis not present

## 2022-09-25 DIAGNOSIS — Z7952 Long term (current) use of systemic steroids: Secondary | ICD-10-CM | POA: Insufficient documentation

## 2022-09-25 DIAGNOSIS — T798XXA Other early complications of trauma, initial encounter: Secondary | ICD-10-CM | POA: Diagnosis not present

## 2022-09-25 DIAGNOSIS — Z92241 Personal history of systemic steroid therapy: Secondary | ICD-10-CM | POA: Diagnosis not present

## 2022-09-25 DIAGNOSIS — W2203XA Walked into furniture, initial encounter: Secondary | ICD-10-CM | POA: Insufficient documentation

## 2022-09-25 DIAGNOSIS — J439 Emphysema, unspecified: Secondary | ICD-10-CM | POA: Insufficient documentation

## 2022-09-25 DIAGNOSIS — I87312 Chronic venous hypertension (idiopathic) with ulcer of left lower extremity: Secondary | ICD-10-CM | POA: Diagnosis not present

## 2022-09-25 DIAGNOSIS — Z87891 Personal history of nicotine dependence: Secondary | ICD-10-CM | POA: Diagnosis not present

## 2022-09-25 NOTE — Progress Notes (Signed)
[name] ([id number]) [id number] Page 1 of 9 Visit Report for 09/25/2022 Chief Complaint Document Details Patient Name: Date of Service: [name], [name]. 09/25/2022 12:45 PM Medical Record Number: [id number] Patient Account Number: [id number] Date of Birth/Sex: Treating RN: 1928/11/14 (89 y.o. F) Primary Care Provider: [place], [name] Other Clinician: Referring Provider: Treating Provider/Extender: [name], [name] in Treatment: 8 Information Obtained from: Patient Chief Complaint 07/30/2022; left lower extremity wound secondary to trauma Electronic Signature(s) Signed: 09/25/2022 2:04:51 PM By: [name] Corwin DO Entered By: [name] on 09/25/2022 10:52:03 -------------------------------------------------------------------------------- HPI Details Patient Name: Date of Service: [name]. 09/25/2022 12:45 PM Medical Record Number: [id number] Patient Account Number: [id number] Date of Birth/Sex: Treating RN: [birth year] (89 y.o. F) Primary Care Provider: [place], [name] Other Clinician: Referring Provider: Treating Provider/Extender: [name], [name] in Treatment: 8 History of Present Illness HPI Description: ADMISSION 10/06/2018 This is an 89 year old woman who is here for wounds on her bilateral anterior lower legs. She was previously seen in early 2017 with a wound on her left leg that was traumatic. Felt to have lymphedema and venous insufficiency at that time although she had a reflux study that did not show significant superficial reflux in the left lower extremity. This healed. At some point she has had stockings prescribed but she cannot get the standard stocking on. The patient is also on chronic prednisone I think related to polymyalgia rheumatica currently at 2.5 mg daily. The patient states that in July she slipped out of bed traumatizing the anterior part of her leg. She was  seen by her primary care physicians beginning on 08/14/2018. It was noted to have some erythema and swelling she was given Keflex. On 7/30 she had doxycycline and I think he has had perhaps another round of doxycycline after this. Noted in their notes to have a history of chronic venous insufficiency and some edema. Sometime in August she traumatized her left anterior leg with the tip of her cane and has an open wound in this area as well. She has not been doing anything specific to the wounds themselves leaving them open to air and putting Band-Aids on top. Past medical history; hypertension, polymyalgia rheumatica on chronic prednisone, history of microdiscectomy, B12 deficiency, vitamin D deficiency, emphysema, osteoporosis ABIs in our clinic were 0.96 on the right and 0.99 on the left. 9/22; 2 small wounds on the left anterior and right anterior tibial area. These are better than last week. We put her in 3 layer compression unfortunately she appears to have a new wound on the medial right lower leg anteriorly probably because of wrap injury. She is on chronic prednisone with a history of chronic polymyalgia rheumatica and has very frail skin on her lower extremities 9/29; 2 small wounds on the left anterior and right anterior tibial areas these continue to improve. The abrasion she had from last week appears to have closed over. 10/6; the patient had 2 wounds on the left anterior and right anterior tibial wounds. The left anterior wound is just about closed. Right anterior tibial is still open. 10/13; the patient's leg is healed on the left as predicted. The area on the right is still open but measuring smaller 10/20; the patient comes in today with a small area on the left medial lower leg. The area on the right is still open but measuring smaller. We had put her in a dual layer stocking last week on the left she is not  able to get the 2 layers on she will only wear 1 layer. 10/27; the left  medial lower leg is closed. The right one is just about closed. We continue to dress the right leg and compression we put her 11/5; the left medial leg is closed and she has a juxta lite stocking on although her daughter had to apply it. The right one is closed today and we will put a juxta lite stocking on this leg as well. The patient states most of the skin changes are due to chronic steroid use. [name] the point this is true but she has tightly fibrotic o [name] ([id number]) [phone].pdf Page 2 of 9 skin in her distal lower extremities suggesting some degree of chronic venous insufficiency as well. She does not have a lot of edema in her leg 07/30/2022 [name] is a 89 year old female with a past medical history of essential hypertension, polymyalgia rheumatica on chronic steroids, and venous insufficiency that presents to the clinic for a 9-week history of nonhealing wound to the left lower extremities. She states she hit her leg against a table and developed a hematoma that eventually opened and drained. She has been using hydrogen peroxide to the wound bed. She currently denies signs of infection. She has not been on antibiotics. She has compression stockings at home but has difficulty putting these on. She states she cannot even put on a sock. 7/15; patient presents for follow-up. She has been using Vashe wet-to-dry dressings although technique is questionable. She has left this open to air for several days because she did not think she had the right gauze to use for the wet-to-dry dressing. 7/22; patient presents for follow-up. She has been using Vashe wet-to-dry dressings. More healthy granulation tissue present today. She had ABIs completed that showed an ABI of 1.07 on the left with biphasic waveforms throughout the system. 7/29; patient presents for follow-up. We have been using Santyl and Hydrofera Blue under Kerlix/Coban. She has tolerated  this compression wrap well. Wound is smaller. 8/5; patient presents for follow-up. We have been using Santyl and Hydrofera Blue under Kerlix/Coban. The wound is smaller. We discussed potentially doing a skin substitute and patient was agreeable to have insurance verification done for this. 8/12; patient presents for follow-up. We have been doing Hydrofera Blue with Santyl under Kerlix/Coban. Wound is smaller today. Due to out-of-pocket cost patient has declined skin substitute placement. 8/19; patient presents for follow-up. We have been doing Hydrofera Blue and Santyl under Kerlix/Coban. Wound again is smaller. 8/26; patient presents for follow-up. We have been using Hydrofera Blue and Santyl under Kerlix/Coban. Wound is smaller. There is an [place] of epithelization now dividing the wound into 2 smaller ones. 9/3; patient presents for follow-up. We have been using Hydrofera Blue and Santyl under Kerlix/Coban. Wound is smaller. Dressing was stuck to the wound bed. Electronic Signature(s) Signed: 09/25/2022 2:04:51 PM By: [name] Corwin DO Entered By: [name] on 09/25/2022 10:53:46 -------------------------------------------------------------------------------- Physical Exam Details Patient Name: Date of Service: [name]. 09/25/2022 12:45 PM Medical Record Number: [id number] Patient Account Number: [id number] Date of Birth/Sex: Treating RN: [birth year] (89 y.o. F) Primary Care Provider: [place], [name] Other Clinician: Referring Provider: Treating Provider/Extender: Lakeysha [name] [name], [name] in Treatment: 8 Constitutional respirations regular, non-labored and within target range for patient.. Cardiovascular 2+ dorsalis pedis/posterior tibialis pulses. Psychiatric pleasant and cooperative. Notes [name] the anterior aspect of the left lower extremity there are a few scattered open areas limited  to skin breakdown. No signs of infection. Venous stasis o dermatitis  throughout the leg. Good edema control. Electronic Signature(s) Signed: 09/25/2022 2:04:51 PM By: [name] Corwin DO Entered By: [name] on 09/25/2022 10:55:03 Physician Orders Details -------------------------------------------------------------------------------- Eden Lathe [name] ([id number]) [id number] Page 3 of 9 Patient Name: Date of Service: [name], [name]. 09/25/2022 12:45 PM Medical Record Number: [id number] Patient Account Number: [id number] Date of Birth/Sex: Treating RN: September 14, 1928 (89 y.o. F) Primary Care Provider: [place], [name] Other Clinician: Referring Provider: Treating Provider/Extender: Eulon Allnutt [name], [name] in Treatment: 8 Verbal / Phone Orders: No Diagnosis Coding Follow-up Appointments ppointment in 1 week. - Dr. Mikey Bussing - room 8 or 9 9/9 at 10:15am. (already schedule) Return A ppointment in 2 [name]. - 10/08/22 - already scheduled Return A Anesthetic (In clinic) Topical Lidocaine 4% applied to wound bed Cellular or Tissue Based Products Other Cellular or Tissue Based Products Orders/Instructions: - Run IVR for Apligraf and Puraply. Approved, but patient opted not to have it. Bathing/ Shower/ Hygiene May shower and wash wound with soap and water. Edema Control - Lymphedema / SCD / Other Bilateral Lower Extremities Elevate legs to the level of the heart or above for 30 minutes daily and/or when sitting for 3-4 times a day throughout the day. Avoid standing for long periods of time. Wound Treatment Wound #4 - Lower Leg Wound Laterality: Left, Anterior Cleanser: Soap and Water 1 x Per Week/30 Days Discharge Instructions: May shower and wash wound with dial antibacterial soap and water prior to dressing change. Cleanser: Vashe 5.8 (oz) 1 x Per Week/30 Days Discharge Instructions: Cleanse the wound with Vashe prior to applying a clean dressing using gauze sponges, not tissue or cotton balls. Peri-Wound  Care: Sween Lotion (Moisturizing lotion) 1 x Per Week/30 Days Discharge Instructions: Apply moisturizing lotion as directed Prim Dressing: PolyMem Silver Non-Adhesive Dressing, 4.25x4.25 in 1 x Per Week/30 Days ary Discharge Instructions: Apply to wound bed as instructed Secondary Dressing: Woven Gauze Sponge, Non-Sterile 4x4 in 1 x Per Week/30 Days Discharge Instructions: Apply over primary dressing as directed. Compression Wrap: Kerlix Roll 4.5x3.1 (in/yd) (Generic) 1 x Per Week/30 Days Discharge Instructions: Apply Kerlix and Coban compression as directed. Compression Wrap: Coban Self-Adherent Wrap 4x5 (in/yd) 1 x Per Week/30 Days Discharge Instructions: Apply over Kerlix as directed. Electronic Signature(s) Signed: 09/25/2022 2:04:51 PM By: [name] Corwin DO Entered By: [name] on 09/25/2022 10:55:15 -------------------------------------------------------------------------------- Problem List Details Patient Name: Date of Service: [name]. 09/25/2022 12:45 PM Medical Record Number: [id number] Patient Account Number: [id number] Date of Birth/Sex: Treating RN: 10/06/1928 (89 y.o. F) Primary Care Provider: [place], [name] Other Clinician: Referring Provider: Treating Provider/Extender: Kiwana Deblasi [name], [name] in Treatment: 8 Active Problems [name], [name] ([id number]) [id number] Page 4 of 9 ICD-10 Encounter Code Description Active Date MDM Diagnosis T79.8XXA Other early complications of trauma, initial encounter 07/30/2022 No Yes L97.822 Non-pressure chronic ulcer of other part of left lower leg with fat layer exposed7/08/2022 No Yes I87.312 Chronic venous hypertension (idiopathic) with ulcer of left lower extremity 07/30/2022 No Yes M35.3 Polymyalgia rheumatica 07/30/2022 No Yes Z92.241 Personal history of systemic steroid therapy 07/30/2022 No Yes Inactive Problems Resolved Problems Electronic Signature(s) Signed:  09/25/2022 2:04:51 PM By: [name] Corwin DO Entered By: [name] on 09/25/2022 10:45:46 -------------------------------------------------------------------------------- Progress Note Details Patient Name: Date of Service: [name]. 09/25/2022 12:45 PM Medical Record Number: [id number] Patient Account Number: [id number] Date of Birth/Sex: Treating RN: 1928-11-02 (89 y.o. F)  Primary Care Provider: [place], [name] Other Clinician: Referring Provider: Treating Provider/Extender: Acacia Latorre [name], [name] in Treatment: 8 Subjective Chief Complaint Information obtained from Patient 07/30/2022; left lower extremity wound secondary to trauma History of Present Illness (HPI) ADMISSION 10/06/2018 This is an 89 year old woman who is here for wounds on her bilateral anterior lower legs. She was previously seen in early 2017 with a wound on her left leg that was traumatic. Felt to have lymphedema and venous insufficiency at that time although she had a reflux study that did not show significant superficial reflux in the left lower extremity. This healed. At some point she has had stockings prescribed but she cannot get the standard stocking on. The patient is also on chronic prednisone I think related to polymyalgia rheumatica currently at 2.5 mg daily. The patient states that in July she slipped out of bed traumatizing the anterior part of her leg. She was seen by her primary care physicians beginning on 08/14/2018. It was noted to have some erythema and swelling she was given Keflex. On 7/30 she had doxycycline and I think he has had perhaps another round of doxycycline after this. Noted in their notes to have a history of chronic venous insufficiency and some edema. Sometime in August she traumatized her left anterior leg with the tip of her cane and has an open wound in this area as well. She has not been doing anything specific to the wounds themselves leaving them open to  air and putting Band-Aids on top. Past medical history; hypertension, polymyalgia rheumatica on chronic prednisone, history of microdiscectomy, B12 deficiency, vitamin D deficiency, emphysema, osteoporosis ABIs in our clinic were 0.96 on the right and 0.99 on the left. 9/22; 2 small wounds on the left anterior and right anterior tibial area. These are better than last week. We put her in 3 layer compression unfortunately she appears to have a new wound on the medial right lower leg anteriorly probably because of wrap injury. She is on chronic prednisone with a history of chronic polymyalgia rheumatica and has very frail skin on her lower extremities [name], [name] ([id number]) [phone].pdf Page 5 of 9 9/29; 2 small wounds on the left anterior and right anterior tibial areas these continue to improve. The abrasion she had from last week appears to have closed over. 10/6; the patient had 2 wounds on the left anterior and right anterior tibial wounds. The left anterior wound is just about closed. Right anterior tibial is still open. 10/13; the patient's leg is healed on the left as predicted. The area on the right is still open but measuring smaller 10/20; the patient comes in today with a small area on the left medial lower leg. The area on the right is still open but measuring smaller. We had put her in a dual layer stocking last week on the left she is not able to get the 2 layers on she will only wear 1 layer. 10/27; the left medial lower leg is closed. The right one is just about closed. We continue to dress the right leg and compression we put her 11/5; the left medial leg is closed and she has a juxta lite stocking on although her daughter had to apply it. The right one is closed today and we will put a juxta lite stocking on this leg as well. The patient states most of the skin changes are due to chronic steroid use. [name] the point this is true but she has tightly  fibrotic  o skin in her distal lower extremities suggesting some degree of chronic venous insufficiency as well. She does not have a lot of edema in her leg 07/30/2022 Ms. Lacrystal Ursery is a 89 year old female with a past medical history of essential hypertension, polymyalgia rheumatica on chronic steroids, and venous insufficiency that presents to the clinic for a 9-week history of nonhealing wound to the left lower extremities. She states she hit her leg against a table and developed a hematoma that eventually opened and drained. She has been using hydrogen peroxide to the wound bed. She currently denies signs of infection. She has not been on antibiotics. She has compression stockings at home but has difficulty putting these on. She states she cannot even put on a sock. 7/15; patient presents for follow-up. She has been using Vashe wet-to-dry dressings although technique is questionable. She has left this open to air for several days because she did not think she had the right gauze to use for the wet-to-dry dressing. 7/22; patient presents for follow-up. She has been using Vashe wet-to-dry dressings. More healthy granulation tissue present today. She had ABIs completed that showed an ABI of 1.07 on the left with biphasic waveforms throughout the system. 7/29; patient presents for follow-up. We have been using Santyl and Hydrofera Blue under Kerlix/Coban. She has tolerated this compression wrap well. Wound is smaller. 8/5; patient presents for follow-up. We have been using Santyl and Hydrofera Blue under Kerlix/Coban. The wound is smaller. We discussed potentially doing a skin substitute and patient was agreeable to have insurance verification done for this. 8/12; patient presents for follow-up. We have been doing Hydrofera Blue with Santyl under Kerlix/Coban. Wound is smaller today. Due to out-of-pocket cost patient has declined skin substitute placement. 8/19; patient presents for follow-up.  We have been doing Hydrofera Blue and Santyl under Kerlix/Coban. Wound again is smaller. 8/26; patient presents for follow-up. We have been using Hydrofera Blue and Santyl under Kerlix/Coban. Wound is smaller. There is an [place] of epithelization now dividing the wound into 2 smaller ones. 9/3; patient presents for follow-up. We have been using Hydrofera Blue and Santyl under Kerlix/Coban. Wound is smaller. Dressing was stuck to the wound bed. Patient History Information obtained from Patient. Family History Cancer - Father, Heart Disease - Father, Lung Disease - Father, No family history of Diabetes, Hereditary Spherocytosis, Hypertension, Seizures, Stroke, Thyroid Problems, Tuberculosis. Social History Former smoker, Marital Status - Widowed, Alcohol Use - Moderate, Drug Use - No History, Caffeine Use - Moderate. Medical History Eyes Denies history of Cataracts, Glaucoma, Optic Neuritis Ear/Nose/Mouth/Throat Denies history of Chronic sinus problems/congestion, Middle ear problems Hematologic/Lymphatic Patient has history of Anemia Denies history of Hemophilia, Human Immunodeficiency Virus, Lymphedema, Sickle Cell Disease Respiratory Denies history of Aspiration, Asthma, Chronic Obstructive Pulmonary Disease (COPD), Pneumothorax, Sleep Apnea, Tuberculosis Cardiovascular Patient has history of Hypertension Denies history of Angina, Arrhythmia, Congestive Heart Failure, Coronary Artery Disease, Deep Vein Thrombosis, Hypotension, Myocardial Infarction, Peripheral Arterial Disease, Peripheral Venous Disease, Phlebitis, Vasculitis Gastrointestinal Denies history of Cirrhosis , Colitis, Crohns, Hepatitis A, Hepatitis B, Hepatitis C Endocrine Denies history of Type I Diabetes, Type II Diabetes Genitourinary Denies history of End Stage Renal Disease Immunological Denies history of Lupus Erythematosus, Raynauds, Scleroderma Integumentary (Skin) Denies history of History of  Burn Musculoskeletal Patient has history of Osteoarthritis Denies history of Gout, Rheumatoid Arthritis, Osteomyelitis Neurologic Denies history of Dementia, Neuropathy, Quadriplegia, Paraplegia, Seizure Disorder Oncologic Denies history of Received Chemotherapy, Received Radiation Psychiatric Denies history of Anorexia/bulimia, Confinement Anxiety [name],  [name] ([id number]) [id number] Page 6 of 9 Objective Constitutional respirations regular, non-labored and within target range for patient.. Vitals Time Taken: 1:25 PM, Height: 60 in, Weight: 97 lbs, BMI: 18.9, Temperature: 98 F, Pulse: 109 bpm, Respiratory Rate: 18 breaths/min, Blood Pressure: 150/80 mmHg. Cardiovascular 2+ dorsalis pedis/posterior tibialis pulses. Psychiatric pleasant and cooperative. General Notes: [name] the anterior aspect of the left lower extremity there are a few scattered open areas limited to skin breakdown. No signs of infection. Venous o stasis dermatitis throughout the leg. Good edema control. Integumentary (Hair, Skin) Wound #4 status is Open. Original cause of wound was Skin [name] ear/Laceration. The date acquired was: 05/22/2022. The wound has been in treatment 8 [name]. The wound is located on the Left,Anterior Lower Leg. The wound measures 0.2cm length x 0.2cm width x 0.1cm depth; 0.031cm^2 area and 0.003cm^3 volume. There is Fat Layer (Subcutaneous Tissue) exposed. There is no tunneling or undermining noted. There is a medium amount of serosanguineous drainage noted. The wound margin is distinct with the outline attached to the wound base. There is small (1-33%) **red granulation within the wound bed. There is a large (67- 100%) amount of necrotic tissue within the wound bed including Adherent Slough. The periwound skin appearance exhibited: Hemosiderin Staining. The periwound skin appearance did not exhibit: Callus, Crepitus, Excoriation, Induration, Rash, Scarring, Dry/Scaly,  Maceration, Atrophie Blanche, Cyanosis, Ecchymosis, Mottled, Pallor, Rubor, Erythema. Periwound temperature was noted as No Abnormality. 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 Since the dressing stuck to the wound bed I recommended switching to PolyMem silver. Continue Kerlix/Coban. Can stop Santyl now. Follow-up in 1 week. Plan Follow-up Appointments: Return Appointment in 1 week. - Dr. Mikey Bussing - room 8 or 9 9/9 at 10:15am. (already schedule) Return Appointment in 2 [name]. - 10/08/22 - already scheduled Anesthetic: (In clinic) Topical Lidocaine 4% applied to wound bed Cellular or Tissue Based Products: Other Cellular or Tissue Based Products Orders/Instructions: - Run IVR for Apligraf and Puraply. Approved, but patient opted not to have it. Bathing/ Shower/ Hygiene: May shower and wash wound with soap and water. Edema Control - Lymphedema / SCD / Other: Elevate legs to the level of the heart or above for 30 minutes daily and/or when sitting for 3-4 times a day throughout the day. Avoid standing for long periods of time. WOUND #4: - Lower Leg Wound Laterality: Left, Anterior Cleanser: Soap and Water 1 x Per Week/30 Days Discharge Instructions: May shower and wash wound with dial antibacterial soap and water prior to dressing change. Cleanser: Vashe 5.8 (oz) 1 x Per Week/30 Days Discharge Instructions: Cleanse the wound with Vashe prior to applying a clean dressing using gauze sponges, not tissue or cotton balls. Peri-Wound Care: Sween Lotion (Moisturizing lotion) 1 x Per Week/30 Days Discharge Instructions: Apply moisturizing lotion  as directed Prim Dressing: PolyMem Silver Non-Adhesive Dressing, 4.25x4.25 in 1 x Per Week/30 Days ary Discharge Instructions: Apply to wound bed as instructed Secondary Dressing: Woven Gauze Sponge, Non-Sterile 4x4 in 1 x Per Week/30 Days Discharge Instructions: Apply over primary dressing as directed. Com pression Wrap: Kerlix Roll 4.5x3.1 (in/yd) (Generic) 1 x Per Week/30 Days Discharge Instructions: Apply Kerlix and Coban compression as directed. Com pression Wrap: Coban  Self-Adherent Wrap 4x5 (in/yd) 1 x Per Week/30 Days Discharge Instructions: Apply over Kerlix as directed. 1. PolyMem silver under Kerlix/Cobanleft lower extremity [name], [name] ([id number]) [phone].pdf Page 7 of 9 2. Follow-up in 1 week Electronic Signature(s) Signed: 09/25/2022 2:04:51 PM By: [name] Corwin DO Entered By: [name] on 09/25/2022 10:55:56 -------------------------------------------------------------------------------- HxROS Details Patient Name: Date of Service: [name]. 09/25/2022 12:45 PM Medical Record Number: [id number] Patient Account Number: [id number] Date of Birth/Sex: Treating RN: 12-Jan-1929 (89 y.o. F) Primary Care Provider: [place], [name] Other Clinician: Referring Provider: Treating Provider/Extender: Eshaan Titzer [name], [name] in Treatment: 8 Information Obtained From Patient Eyes Medical History: Negative for: Cataracts; Glaucoma; Optic Neuritis Ear/Nose/Mouth/Throat Medical History: Negative for: Chronic sinus problems/congestion; Middle ear problems Hematologic/Lymphatic Medical History: Positive for: Anemia Negative for: Hemophilia; Human Immunodeficiency Virus; Lymphedema; Sickle Cell Disease Respiratory Medical History: Negative for: Aspiration; Asthma; Chronic Obstructive Pulmonary Disease (COPD); Pneumothorax; Sleep Apnea; Tuberculosis Cardiovascular Medical History: Positive for: Hypertension Negative  for: Angina; Arrhythmia; Congestive Heart Failure; Coronary Artery Disease; Deep Vein Thrombosis; Hypotension; Myocardial Infarction; Peripheral Arterial Disease; Peripheral Venous Disease; Phlebitis; Vasculitis Gastrointestinal Medical History: Negative for: Cirrhosis ; Colitis; Crohns; Hepatitis A; Hepatitis B; Hepatitis C Endocrine Medical History: Negative for: Type I Diabetes; Type II Diabetes Genitourinary Medical History: Negative for: End Stage Renal Disease Immunological Medical History: Negative for: Lupus Erythematosus; Raynauds; Scleroderma Integumentary (Skin) [name], [name] ([id number]) [phone].pdf Page 8 of 9 Medical History: Negative for: History of Burn Musculoskeletal Medical History: Positive for: Osteoarthritis Negative for: Gout; Rheumatoid Arthritis; Osteomyelitis Neurologic Medical History: Negative for: Dementia; Neuropathy; Quadriplegia; Paraplegia; Seizure Disorder Oncologic Medical History: Negative for: Received Chemotherapy; Received Radiation Psychiatric Medical History: Negative for: Anorexia/bulimia; Confinement Anxiety Immunizations Pneumococcal Vaccine: Received Pneumococcal Vaccination: No Implantable Devices None Family and Social History Cancer: Yes - Father; Diabetes: No; Heart Disease: Yes - Father; Hereditary Spherocytosis: No; Hypertension: No; Lung Disease: Yes - Father; Seizures: No; Stroke: No; Thyroid Problems: No; Tuberculosis: No; Former smoker; Marital Status - Widowed; Alcohol Use: Moderate; Drug Use: No History; Caffeine Use: Moderate; Financial Concerns: No; Food, Clothing or Shelter Needs: No; Support System Lacking: No; Transportation Concerns: No Electronic Signature(s) Signed: 09/25/2022 2:04:51 PM By: [name] Corwin DO Entered By: [name] on 09/25/2022 10:53:58 -------------------------------------------------------------------------------- SuperBill Details Patient Name:  Date of Service: [name]. 09/25/2022 Medical Record Number: [id number] Patient Account Number: [id number] Date of Birth/Sex: Treating RN: Jan 04, 1929 (89 y.o. F) Primary Care Provider: [place], [name] Other Clinician: Referring Provider: Treating Provider/Extender: Soleil Mas [name], [name] in Treatment: 8 Diagnosis Coding ICD-10 Codes Code Description T79.8XXA Other early complications of trauma, initial encounter L97.822 Non-pressure chronic ulcer of other part of left lower leg with fat layer exposed I87.312 Chronic venous hypertension (idiopathic) with ulcer of left lower extremity M35.3 Polymyalgia rheumatica Z92.241 Personal history of systemic steroid therapy Physician Procedures : CPT4 Code Description Modifier [id number] - WC PHYS LEVEL 3 - EST PT ICD-10 Diagnosis Description [name], [name] ([id number]) [id number] L97.822 Non-pressure chronic ulcer of other part of left lower leg with fat layer  exposed I87.312 Chronic venous hypertension (idiopathic) with ulcer of left lower extremity T79.8XXA Other early complications of trauma, initial encounter M35.3 Polymyalgia rheumatica Quantity: 1 1227.pdf Page 9 of 9 Electronic Signature(s) Signed: 09/25/2022 2:04:51 PM By: [name] Corwin DO Entered By: [name] on 09/25/2022 10:56:42

## 2022-09-26 NOTE — Progress Notes (Addendum)
Exposed Suppor Wound Margin: Distinct, outline attached Exudate Amount: Medium Exudate Type: Serosanguineous Exudate Color: red, brown t Structures Foul Odor After Cleansing: No Slough/Fibrino Yes Wound Bed Granulation Amount: Small (1-33%) Exposed Structure Granulation Quality: Red Fascia Exposed: No Necrotic Amount: Large (67-100%) Fat Layer (Subcutaneous Tissue) Exposed: Yes Necrotic Quality: Adherent Slough Tendon  Exposed: No Muscle Exposed: No Joint Exposed: No Bone Exposed: No Periwound Skin Texture Texture Color No Abnormalities Noted: No No Abnormalities Noted: No Callus: No Atrophie Blanche: No Crepitus: No Cyanosis: No Excoriation: No Ecchymosis: No Induration: No Erythema: No Rash: No Hemosiderin Staining: Yes Scarring: No Mottled: No Pallor: No Moisture Rubor: No No Abnormalities Noted: No Dry / Scaly: No Temperature / Pain Maceration: No Temperature: No Abnormality Electronic Signature(s) Signed: 09/25/2022 5:35:33 PM By: Redmond Pulling RN, [name] Entered By: Redmond Pulling on 09/25/2022 10:23:20 Bordner, Lezlie T ([id number]) [id number] Page 8 of 8 -------------------------------------------------------------------------------- Vitals Details Patient Name: Date of Service: [name] MARIELLE, KLENKE T. 09/25/2022 12:45 PM Medical Record Number: [id number] Patient Account Number: [id number] Date of Birth/Sex: Treating RN: 16-Feb-1928 (89 y.o. Orville Govern Primary Care Lovette Merta: [place], Betty Other Clinician: Referring Tao Satz: Treating Colson Barco/Extender: Hoffman, [name] [place], Betty Weeks in Treatment: 8 Vital Signs Time Taken: 13:25 Temperature (F): 98 Height (in): 60 Pulse (bpm): 109 Weight (lbs): 97 Respiratory Rate (breaths/min): 18 Body Mass Index (BMI): 18.9 Blood Pressure (mmHg): 150/80 Reference Range: 80 - 120 mg / dl Electronic Signature(s) Signed: 09/25/2022 5:35:33 PM By: Redmond Pulling RN, [name] Entered By: Redmond Pulling on 09/25/2022 10:25:29  Shroff, Joie T ([id number]) [id number] Page 1 of 8 Visit Report for 09/25/2022 Arrival Information Details Patient Name: Date of Service: [name] SHENETA, RUAN T. 09/25/2022 12:45 PM Medical Record Number: [id number] Patient Account Number: [id number] Date of Birth/Sex: Treating RN: 1928-02-09 (89 y.o. F) Primary Care Jowanda Heeg: [place], Betty Other Clinician: Referring Reis Pienta: Treating Vasilisa Vore/Extender: Hoffman, [name] [place], Betty Weeks in Treatment: 8 Visit Information History Since Last Visit Added or deleted any medications: No Patient Arrived: [name] Any new allergies or adverse reactions: No Arrival Time: 13:16 Had a fall or experienced change in No Accompanied By: daughter activities of daily living that may affect Transfer Assistance: None risk of falls: Patient Identification Verified: Yes Signs or symptoms of abuse/neglect since last visito No Secondary Verification Process Completed: Yes Hospitalized since last visit: No Patient Requires Transmission-Based Precautions: No Implantable device outside of the clinic excluding No Patient Has Alerts: No cellular tissue based products placed in the center since last visit: Has Dressing in Place as Prescribed: Yes Has Compression in Place as Prescribed: Yes Pain Present Now: No Electronic Signature(s) Signed: 09/25/2022 5:00:52 PM By: Thayer Dallas Entered By: Thayer Dallas on 09/25/2022 10:16:42 -------------------------------------------------------------------------------- Clinic Level of Care Assessment Details Patient Name: Date of Service: [name] LAVONDA, IMDIEKE T. 09/25/2022 12:45 PM Medical Record Number: [id number] Patient Account Number: [id number] Date of Birth/Sex: Treating RN: 1928-04-07 (89 y.o. F) Primary Care Terell Kincy: [place], Betty Other Clinician: Referring Jillann Charette: Treating Anthonella Klausner/Extender: Hoffman, [name] [place], Betty Weeks in Treatment: 8 Clinic Level of Care  Assessment Items TOOL 4 Quantity Score []  - 0 Use when only an EandM is performed on FOLLOW-UP visit ASSESSMENTS - Nursing Assessment / Reassessment X- 1 10 Reassessment of Co-morbidities (includes updates in patient status) X- 1 5 Reassessment of Adherence to Treatment Plan ASSESSMENTS - Wound and Skin A ssessment / Reassessment X - Simple Wound Assessment / Reassessment - one wound 1 5 []  - 0 Complex Wound Assessment / Reassessment - multiple wounds []  - 0 Dermatologic / Skin Assessment (not related to wound area) ASSESSMENTS - Focused Assessment []  - 0 Circumferential Edema Measurements - multi extremities []  - 0 Nutritional Assessment / Counseling / Intervention Deiter, Dashley T ([id number]) [id number] Page 2 of 8 []  - 0 Lower Extremity Assessment (monofilament, tuning fork, pulses) []  - 0 Peripheral Arterial Disease Assessment (using hand held doppler) ASSESSMENTS - Ostomy and/or Continence Assessment and Care []  - 0 Incontinence Assessment and Management []  - 0 Ostomy Care Assessment and Management (repouching, etc.) PROCESS - Coordination of Care X - Simple Patient / Family Education for ongoing care 1 15 []  - 0 Complex (extensive) Patient / Family Education for ongoing care X- 1 10 Staff obtains Chiropractor, Records, T Results / Process Orders est []  - 0 Staff telephones HHA, Nursing Homes / Clarify orders / etc []  - 0 Routine Transfer to another Facility (non-emergent condition) []  - 0 Routine Hospital Admission (non-emergent condition) []  - 0 New Admissions / Manufacturing engineer / Ordering NPWT Apligraf, etc. , []  - 0 Emergency Hospital Admission (emergent condition) X- 1 10 Simple Discharge Coordination []  - 0 Complex (extensive) Discharge Coordination PROCESS - Special Needs []  - 0 Pediatric / Minor Patient Management []  - 0 Isolation Patient Management []  - 0 Hearing / Language / Visual special needs []  -  0 Assessment of Community assistance (transportation, D/C planning, etc.) []  - 0 Additional assistance / Altered mentation []  - 0 Support Surface(s) Assessment (bed, cushion, seat, etc.) INTERVENTIONS - Wound Cleansing / Measurement X -  Margin: Small (1-33%) N/A N/A Granulation Amount: Red N/A N/A Granulation Quality: Large (67-100%) N/A N/A Necrotic Amount: Fat Layer (Subcutaneous Tissue): Yes N/A N/A Exposed Structures: Fascia: No Tendon: No Muscle: No Joint: No Bone: No None N/A N/A Epithelialization: Excoriation: No N/A N/A Periwound Skin Texture: Induration: No Callus: No Crepitus: No Rash: No Scarring: No Maceration: No N/A N/A Periwound Skin Moisture: Dry/Scaly: No Hemosiderin Staining: Yes N/A N/A Periwound Skin Color: Atrophie Blanche: No Cyanosis: No Ecchymosis: No Erythema: No Mottled: No Pallor: No Rubor: No No Abnormality N/A N/A Temperature: Treatment Notes Electronic Signature(s) Signed: 09/25/2022 2:04:51 PM By: Geralyn Corwin DO Entered By: Geralyn Corwin on 09/25/2022 10:45:52 Maudlin, Murel T ([id number]) [id number] Page 5 of 8 -------------------------------------------------------------------------------- Multi-Disciplinary Care Plan Details Patient Name: Date of Service: [name] REEVA, LUTTS T. 09/25/2022 12:45 PM Medical Record Number: [id number] Patient Account Number: [id number] Date of Birth/Sex: Treating RN: 1928-04-29 (89 y.o. F) Primary Care Miko Sirico: [place], Betty Other Clinician: Referring Honestie Kulik: Treating Lavonta Tillis/Extender: Hoffman, [name] [place], Betty Weeks in Treatment: 8 Active Inactive Wound/Skin Impairment Nursing Diagnoses: Impaired tissue  integrity Goals: Patient/caregiver will verbalize understanding of skin care regimen Date Initiated: 07/30/2022 Target Resolution Date: 10/04/2022 Goal Status: Active Interventions: Assess patient/caregiver ability to obtain necessary supplies Assess patient/caregiver ability to perform ulcer/skin care regimen upon admission and as needed Assess ulceration(s) every visit Provide education on ulcer and skin care Screen for HBO Treatment Activities: Skin care regimen initiated : 07/30/2022 Topical wound management initiated : 07/30/2022 Notes: Electronic Signature(s) Signed: 09/25/2022 5:00:52 PM By: Thayer Dallas Entered By: Thayer Dallas on 09/25/2022 10:31:18 -------------------------------------------------------------------------------- Pain Assessment Details Patient Name: Date of Service: [name] [name] 09/25/2022 12:45 PM Medical Record Number: [id number] Patient Account Number: [id number] Date of Birth/Sex: Treating RN: [birth year] (89 y.o. F) Primary Care Daelan Gatt: [place], Betty Other Clinician: Referring Journee Kohen: Treating Marke Goodwyn/Extender: Hoffman, [name] [place], Betty Weeks in Treatment: 8 Active Problems Location of Pain Severity and Description of Pain Patient Has Paino No Site Locations Pfarr, Mendon T ([id number]) [id number] Page 6 of 8 Pain Management and Medication Current Pain Management: Electronic Signature(s) Signed: 09/25/2022 5:00:52 PM By: Thayer Dallas Entered By: Thayer Dallas on 09/25/2022 10:16:52 -------------------------------------------------------------------------------- Patient/Caregiver Education Details Patient Name: Date of Service: [name] [name] 9/3/2024andnbsp12:45 PM Medical Record Number: [id number] Patient Account Number: [id number] Date of Birth/Gender: Treating RN: 14-Jan-1929 (89 y.o. F) Primary Care Physician: [place], Betty Other Clinician: Referring Physician: Treating  Physician/Extender: Hoffman, [name] [place], Betty Weeks in Treatment: 8 Education Assessment Education Provided To: Patient Education Topics Provided Wound/Skin Impairment: Methods: Explain/Verbal Responses: State content correctly Electronic Signature(s) Signed: 09/25/2022 5:00:52 PM By: Thayer Dallas Entered By: Thayer Dallas on 09/25/2022 10:31:46 -------------------------------------------------------------------------------- Wound Assessment Details Patient Name: Date of Service: [name] [name] 09/25/2022 12:45 PM Medical Record Number: [id number] Patient Account Number: [id number] Date of Birth/Sex: Treating RN: 21-Apr-1928 (89 y.o. Orville Govern Primary Care Spero Gunnels: [place], Betty Other Clinician: Referring Jlynn Langille: Treating Keyonta Barradas/Extender: Hoffman, [name] [place], Betty Kirkland, Rosanky T ([id number]) [id number] Page 7 of 8 Weeks in Treatment: 8 Wound Status Wound Number: 4 Primary Etiology: Trauma, Other Wound Location: Left, Anterior Lower Leg Wound Status: Open Wounding Event: Skin Tear/Laceration Comorbid History: Anemia, Hypertension, Osteoarthritis Date Acquired: 05/22/2022 Weeks Of Treatment: 8 Clustered Wound: No Photos Wound Measurements Length: (cm) 0.2 Width: (cm) 0.2 Depth: (cm) 0.1 Area: (cm) 0.031 Volume: (cm) 0.003 % Reduction in Area: 99.8% % Reduction in Volume: 99.9% Epithelialization: None Tunneling: No Undermining: No Wound Description Classification: Full Thickness With  Shroff, Joie T ([id number]) [id number] Page 1 of 8 Visit Report for 09/25/2022 Arrival Information Details Patient Name: Date of Service: [name] SHENETA, RUAN T. 09/25/2022 12:45 PM Medical Record Number: [id number] Patient Account Number: [id number] Date of Birth/Sex: Treating RN: 1928-02-09 (89 y.o. F) Primary Care Jowanda Heeg: [place], Betty Other Clinician: Referring Reis Pienta: Treating Vasilisa Vore/Extender: Hoffman, [name] [place], Betty Weeks in Treatment: 8 Visit Information History Since Last Visit Added or deleted any medications: No Patient Arrived: [name] Any new allergies or adverse reactions: No Arrival Time: 13:16 Had a fall or experienced change in No Accompanied By: daughter activities of daily living that may affect Transfer Assistance: None risk of falls: Patient Identification Verified: Yes Signs or symptoms of abuse/neglect since last visito No Secondary Verification Process Completed: Yes Hospitalized since last visit: No Patient Requires Transmission-Based Precautions: No Implantable device outside of the clinic excluding No Patient Has Alerts: No cellular tissue based products placed in the center since last visit: Has Dressing in Place as Prescribed: Yes Has Compression in Place as Prescribed: Yes Pain Present Now: No Electronic Signature(s) Signed: 09/25/2022 5:00:52 PM By: Thayer Dallas Entered By: Thayer Dallas on 09/25/2022 10:16:42 -------------------------------------------------------------------------------- Clinic Level of Care Assessment Details Patient Name: Date of Service: [name] LAVONDA, IMDIEKE T. 09/25/2022 12:45 PM Medical Record Number: [id number] Patient Account Number: [id number] Date of Birth/Sex: Treating RN: 1928-04-07 (89 y.o. F) Primary Care Terell Kincy: [place], Betty Other Clinician: Referring Jillann Charette: Treating Anthonella Klausner/Extender: Hoffman, [name] [place], Betty Weeks in Treatment: 8 Clinic Level of Care  Assessment Items TOOL 4 Quantity Score []  - 0 Use when only an EandM is performed on FOLLOW-UP visit ASSESSMENTS - Nursing Assessment / Reassessment X- 1 10 Reassessment of Co-morbidities (includes updates in patient status) X- 1 5 Reassessment of Adherence to Treatment Plan ASSESSMENTS - Wound and Skin A ssessment / Reassessment X - Simple Wound Assessment / Reassessment - one wound 1 5 []  - 0 Complex Wound Assessment / Reassessment - multiple wounds []  - 0 Dermatologic / Skin Assessment (not related to wound area) ASSESSMENTS - Focused Assessment []  - 0 Circumferential Edema Measurements - multi extremities []  - 0 Nutritional Assessment / Counseling / Intervention Deiter, Dashley T ([id number]) [id number] Page 2 of 8 []  - 0 Lower Extremity Assessment (monofilament, tuning fork, pulses) []  - 0 Peripheral Arterial Disease Assessment (using hand held doppler) ASSESSMENTS - Ostomy and/or Continence Assessment and Care []  - 0 Incontinence Assessment and Management []  - 0 Ostomy Care Assessment and Management (repouching, etc.) PROCESS - Coordination of Care X - Simple Patient / Family Education for ongoing care 1 15 []  - 0 Complex (extensive) Patient / Family Education for ongoing care X- 1 10 Staff obtains Chiropractor, Records, T Results / Process Orders est []  - 0 Staff telephones HHA, Nursing Homes / Clarify orders / etc []  - 0 Routine Transfer to another Facility (non-emergent condition) []  - 0 Routine Hospital Admission (non-emergent condition) []  - 0 New Admissions / Manufacturing engineer / Ordering NPWT Apligraf, etc. , []  - 0 Emergency Hospital Admission (emergent condition) X- 1 10 Simple Discharge Coordination []  - 0 Complex (extensive) Discharge Coordination PROCESS - Special Needs []  - 0 Pediatric / Minor Patient Management []  - 0 Isolation Patient Management []  - 0 Hearing / Language / Visual special needs []  -  0 Assessment of Community assistance (transportation, D/C planning, etc.) []  - 0 Additional assistance / Altered mentation []  - 0 Support Surface(s) Assessment (bed, cushion, seat, etc.) INTERVENTIONS - Wound Cleansing / Measurement X -

## 2022-10-01 ENCOUNTER — Encounter (HOSPITAL_BASED_OUTPATIENT_CLINIC_OR_DEPARTMENT_OTHER): Payer: Medicare Other | Admitting: Internal Medicine

## 2022-10-01 DIAGNOSIS — T798XXA Other early complications of trauma, initial encounter: Secondary | ICD-10-CM

## 2022-10-01 DIAGNOSIS — Z92241 Personal history of systemic steroid therapy: Secondary | ICD-10-CM

## 2022-10-01 DIAGNOSIS — I87312 Chronic venous hypertension (idiopathic) with ulcer of left lower extremity: Secondary | ICD-10-CM

## 2022-10-01 DIAGNOSIS — J439 Emphysema, unspecified: Secondary | ICD-10-CM | POA: Diagnosis not present

## 2022-10-01 DIAGNOSIS — M353 Polymyalgia rheumatica: Secondary | ICD-10-CM | POA: Diagnosis not present

## 2022-10-01 DIAGNOSIS — L97822 Non-pressure chronic ulcer of other part of left lower leg with fat layer exposed: Secondary | ICD-10-CM

## 2022-10-01 DIAGNOSIS — Z7952 Long term (current) use of systemic steroids: Secondary | ICD-10-CM | POA: Diagnosis not present

## 2022-10-01 DIAGNOSIS — Z87891 Personal history of nicotine dependence: Secondary | ICD-10-CM | POA: Diagnosis not present

## 2022-10-01 NOTE — Progress Notes (Signed)
[name] [name], [name] [name] ([id number]) [id number] Page 1 of 8 Visit Report for 10/01/2022 Chief Complaint Document Details Patient Name: Date of Service: [name] [name], [name] [name]. 10/01/2022 10:15 A M Medical Record Number: [id number] Patient Account Number: [id number] Date of Birth/Sex: Treating RN: September 18, 1928 (89 y.o. F) Primary Care Provider: [place], Betty Other Clinician: Referring Provider: Treating Provider/Extender: Sabine Tenenbaum [place], Betty Weeks in Treatment: 9 Information Obtained from: Patient Chief Complaint 07/30/2022; left lower extremity wound secondary to trauma Electronic Signature(s) Signed: 10/01/2022 11:56:18 AM By: Geralyn Corwin DO Entered By: Geralyn Corwin on 10/01/2022 07:58:28 -------------------------------------------------------------------------------- HPI Details Patient Name: Date of Service: [name] [name], [name] [name]. 10/01/2022 10:15 A M Medical Record Number: [id number] Patient Account Number: [id number] Date of Birth/Sex: Treating RN: Jun 17, 1928 (89 y.o. F) Primary Care Provider: [place], Betty Other Clinician: Referring Provider: Treating Provider/Extender: Shadawn Hanaway [place], Betty Weeks in Treatment: 9 History of Present Illness HPI Description: ADMISSION 10/06/2018 This is an 89 year old woman who is here for wounds on her bilateral anterior lower legs. She was previously seen in early 2017 with a wound on her left leg that was traumatic. Felt to have lymphedema and venous insufficiency at that time although she had a reflux study that did not show significant superficial reflux in the left lower extremity. This healed. At some point she has had stockings prescribed but she cannot get the standard stocking on. The patient is also on chronic prednisone I think related to polymyalgia rheumatica currently at 2.5 mg daily. The patient states that in July she slipped out of bed traumatizing the anterior part of her leg. She was  seen by her primary care physicians beginning on 08/14/2018. It was noted to have some erythema and swelling she was given Keflex. On 7/30 she had doxycycline and I think he has had perhaps another round of doxycycline after this. Noted in their notes to have a history of chronic venous insufficiency and some edema. Sometime in August she traumatized her left anterior leg with the tip of her cane and has an open wound in this area as well. She has not been doing anything specific to the wounds themselves leaving them open to air and putting Band-Aids on top. Past medical history; hypertension, polymyalgia rheumatica on chronic prednisone, history of microdiscectomy, B12 deficiency, vitamin D deficiency, emphysema, osteoporosis ABIs in our clinic were 0.96 on the right and 0.99 on the left. 9/22; 2 small wounds on the left anterior and right anterior tibial area. These are better than last week. We put her in 3 layer compression unfortunately she appears to have a new wound on the medial right lower leg anteriorly probably because of wrap injury. She is on chronic prednisone with a history of chronic polymyalgia rheumatica and has very frail skin on her lower extremities 9/29; 2 small wounds on the left anterior and right anterior tibial areas these continue to improve. The abrasion she had from last week appears to have closed over. 10/6; the patient had 2 wounds on the left anterior and right anterior tibial wounds. The left anterior wound is just about closed. Right anterior tibial is still open. 10/13; the patient's leg is healed on the left as predicted. The area on the right is still open but measuring smaller 10/20; the patient comes in today with a small area on the left medial lower leg. The area on the right is still open but measuring smaller. We had put her in a dual layer stocking last week on the left she  is not able to get the 2 layers on she will only wear 1 layer. 10/27; the left  medial lower leg is closed. The right one is just about closed. We continue to dress the right leg and compression we put her 11/5; the left medial leg is closed and she has a juxta lite stocking on although her daughter had to apply it. The right one is closed today and we will put a juxta lite stocking on this leg as well. The patient states most of the skin changes are due to chronic steroid use. [name] the point this is true but she has tightly fibrotic o [name], [name] [name] ([id number]) [phone].pdf Page 2 of 8 skin in her distal lower extremities suggesting some degree of chronic venous insufficiency as well. She does not have a lot of edema in her leg 07/30/2022 [name] [name] is a 89 year old female with a past medical history of essential hypertension, polymyalgia rheumatica on chronic steroids, and venous insufficiency that presents to the clinic for a 9-week history of nonhealing wound to the left lower extremities. She states she hit her leg against a table and developed a hematoma that eventually opened and drained. She has been using hydrogen peroxide to the wound bed. She currently denies signs of infection. She has not been on antibiotics. She has compression stockings at home but has difficulty putting these on. She states she cannot even put on a sock. 7/15; patient presents for follow-up. She has been using Vashe wet-to-dry dressings although technique is questionable. She has left this open to air for several days because she did not think she had the right gauze to use for the wet-to-dry dressing. 7/22; patient presents for follow-up. She has been using Vashe wet-to-dry dressings. More healthy granulation tissue present today. She had ABIs completed that showed an ABI of 1.07 on the left with biphasic waveforms throughout the system. 7/29; patient presents for follow-up. We have been using Santyl and Hydrofera Blue under Kerlix/Coban. She has tolerated  this compression wrap well. Wound is smaller. 8/5; patient presents for follow-up. We have been using Santyl and Hydrofera Blue under Kerlix/Coban. The wound is smaller. We discussed potentially doing a skin substitute and patient was agreeable to have insurance verification done for this. 8/12; patient presents for follow-up. We have been doing Hydrofera Blue with Santyl under Kerlix/Coban. Wound is smaller today. Due to out-of-pocket cost patient has declined skin substitute placement. 8/19; patient presents for follow-up. We have been doing Hydrofera Blue and Santyl under Kerlix/Coban. Wound again is smaller. 8/26; patient presents for follow-up. We have been using Hydrofera Blue and Santyl under Kerlix/Coban. Wound is smaller. There is an [place] of epithelization now dividing the wound into 2 smaller ones. 9/3; patient presents for follow-up. We have been using Hydrofera Blue and Santyl under Kerlix/Coban. Wound is smaller. Dressing was stuck to the wound bed. 9/9; Patient presents for follow-up. We use PolyMem silver under Kerlix/Coban last week and the wound is healed. She has no issues or complaints. Electronic Signature(s) Signed: 10/01/2022 11:56:18 AM By: Geralyn Corwin DO Entered By: Geralyn Corwin on 10/01/2022 08:01:32 -------------------------------------------------------------------------------- Physical Exam Details Patient Name: Date of Service: [name] [name]. 10/01/2022 10:15 A M Medical Record Number: [id number] Patient Account Number: [id number] Date of Birth/Sex: Treating RN: Apr 04, 1928 (89 y.o. F) Primary Care Provider: [place], Betty Other Clinician: Referring Provider: Treating Provider/Extender: Dorrene Bently [place], Betty Weeks in Treatment: 9 Constitutional respirations regular, non-labored and within target range for patient.. Cardiovascular  2+ dorsalis pedis/posterior tibialis pulses. Psychiatric pleasant and cooperative. Notes [name] the anterior  aspect of the left lower extremity there is epithelization to the previous wound site. No signs of infection. Venous stasis dermatitis throughout o the leg. Good edema control. Electronic Signature(s) Signed: 10/01/2022 11:56:18 AM By: Geralyn Corwin DO Entered By: Geralyn Corwin on 10/01/2022 08:02:06 Bentley, Murphy [name] ([id number]) [id number] Page 3 of 8 -------------------------------------------------------------------------------- Physician Orders Details Patient Name: Date of Service: [name] [name], [name] [name]. 10/01/2022 10:15 A M Medical Record Number: [id number] Patient Account Number: [id number] Date of Birth/Sex: Treating RN: 11-Dec-1928 (89 y.o. Gevena Mart Primary Care Provider: [place], Betty Other Clinician: Referring Provider: Treating Provider/Extender: Kayln Garceau [place], Betty Weeks in Treatment: 9 Verbal / Phone Orders: No Diagnosis Coding Discharge From [facility] Discharge from Wound Care Center - Please call us if you need [place]. Congratulations on healing!!!!! Anesthetic (In clinic) Topical Lidocaine 4% applied to wound bed Bathing/ Shower/ Hygiene May shower and wash wound with soap and water. Edema Control - Lymphedema / SCD / Other Bilateral Lower Extremities Elevate legs to the level of the heart or above for 30 minutes daily and/or when sitting for 3-4 times a day throughout the day. Avoid standing for long periods of time. Patient to wear own compression stockings every day. Electronic Signature(s) Signed: 10/01/2022 11:56:18 AM By: Geralyn Corwin DO Entered By: Geralyn Corwin on 10/01/2022 08:02:12 -------------------------------------------------------------------------------- Problem List Details Patient Name: Date of Service: [name] [name]. 10/01/2022 10:15 A M Medical Record Number: [id number] Patient Account Number: [id number] Date of Birth/Sex: Treating RN: [birth year] (89 y.o. Gevena Mart Primary Care Provider: [place], Betty Other Clinician: Referring Provider: Treating Provider/Extender: Suhaib Guzzo [place], Betty Weeks in Treatment: 9 Active Problems ICD-10 Encounter Code Description Active Date MDM Diagnosis T79.8XXA Other early complications of trauma, initial encounter 07/30/2022 No Yes L97.822 Non-pressure chronic ulcer of other part of left lower leg with fat layer exposed7/08/2022 No Yes I87.312 Chronic venous hypertension (idiopathic) with ulcer of left lower extremity 07/30/2022 No Yes M35.3 Polymyalgia rheumatica 07/30/2022 No Yes Z92.241 Personal history of systemic steroid therapy 07/30/2022 No Yes [name] [name], [name] [name] ([id number]) [id number] Page 4 of 8 Inactive Problems Resolved Problems Electronic Signature(s) Signed: 10/01/2022 11:56:18 AM By: Geralyn Corwin DO Entered By: Geralyn Corwin on 10/01/2022 07:58:11 -------------------------------------------------------------------------------- Progress Note Details Patient Name: Date of Service: [name] [name]. 10/01/2022 10:15 A M Medical Record Number: [id number] Patient Account Number: [id number] Date of Birth/Sex: Treating RN: [birth year] (89 y.o. F) Primary Care Provider: [place], Betty Other Clinician: Referring Provider: Treating Provider/Extender: Ammie Warrick [place], Betty Weeks in Treatment: 9 Subjective Chief Complaint Information obtained from Patient 07/30/2022; left lower extremity wound secondary to trauma History of Present Illness (HPI) ADMISSION 10/06/2018 This is an 89 year old woman who is here for wounds on her bilateral anterior lower legs. She was previously seen in early 2017 with a wound on her left leg that was traumatic. Felt to have lymphedema and venous insufficiency at that time although she had a reflux study that did not show significant superficial reflux in the left lower extremity. This healed. At some point she has had  stockings prescribed but she cannot get the standard stocking on. The patient is also on chronic prednisone I think related to polymyalgia rheumatica currently at 2.5 mg daily. The patient states that in July she slipped out of bed traumatizing the anterior part of her leg. She was seen by her primary care physicians beginning on 08/14/2018. It  was noted to have some erythema and swelling she was given Keflex. On 7/30 she had doxycycline and I think he has had perhaps another round of doxycycline after this. Noted in their notes to have a history of chronic venous insufficiency and some edema. Sometime in August she traumatized her left anterior leg with the tip of her cane and has an open wound in this area as well. She has not been doing anything specific to the wounds themselves leaving them open to air and putting Band-Aids on top. Past medical history; hypertension, polymyalgia rheumatica on chronic prednisone, history of microdiscectomy, B12 deficiency, vitamin D deficiency, emphysema, osteoporosis ABIs in our clinic were 0.96 on the right and 0.99 on the left. 9/22; 2 small wounds on the left anterior and right anterior tibial area. These are better than last week. We put her in 3 layer compression unfortunately she appears to have a new wound on the medial right lower leg anteriorly probably because of wrap injury. She is on chronic prednisone with a history of chronic polymyalgia rheumatica and has very frail skin on her lower extremities 9/29; 2 small wounds on the left anterior and right anterior tibial areas these continue to improve. The abrasion she had from last week appears to have closed over. 10/6; the patient had 2 wounds on the left anterior and right anterior tibial wounds. The left anterior wound is just about closed. Right anterior tibial is still open. 10/13; the patient's leg is healed on the left as predicted. The area on the right is still open but measuring smaller 10/20;  the patient comes in today with a small area on the left medial lower leg. The area on the right is still open but measuring smaller. We had put her in a dual layer stocking last week on the left she is not able to get the 2 layers on she will only wear 1 layer. 10/27; the left medial lower leg is closed. The right one is just about closed. We continue to dress the right leg and compression we put her 11/5; the left medial leg is closed and she has a juxta lite stocking on although her daughter had to apply it. The right one is closed today and we will put a juxta lite stocking on this leg as well. The patient states most of the skin changes are due to chronic steroid use. [name] the point this is true but she has tightly fibrotic o skin in her distal lower extremities suggesting some degree of chronic venous insufficiency as well. She does not have a lot of edema in her leg 07/30/2022 [name] [name] is a 89 year old female with a past medical history of essential hypertension, polymyalgia rheumatica on chronic steroids, and venous insufficiency that presents to the clinic for a 9-week history of nonhealing wound to the left lower extremities. She states she hit her leg against a table and developed a hematoma that eventually opened and drained. She has been using hydrogen peroxide to the wound bed. She currently denies signs of infection. She has not been on antibiotics. She has compression stockings at home but has difficulty putting these on. She states she cannot even put on a sock. 7/15; patient presents for follow-up. She has been using Vashe wet-to-dry dressings although technique is questionable. She has left this open to air for several days because she did not think she had the right gauze to use for the wet-to-dry dressing. 7/22; patient presents for follow-up. She has  been using Vashe wet-to-dry dressings. More healthy granulation tissue present today. She had ABIs completed that showed  an ABI of 1.07 on the left with biphasic waveforms throughout the system. 7/29; patient presents for follow-up. We have been using Santyl and Hydrofera Blue under Kerlix/Coban. She has tolerated this compression wrap well. Wound is smaller. 8/5; patient presents for follow-up. We have been using Santyl and Hydrofera Blue under Kerlix/Coban. The wound is smaller. We discussed potentially doing a skin substitute and patient was agreeable to have insurance verification done for this. [name] [name], [name] [name] ([id number]) [id number] Page 5 of 8 8/12; patient presents for follow-up. We have been doing Hydrofera Blue with Santyl under Kerlix/Coban. Wound is smaller today. Due to out-of-pocket cost patient has declined skin substitute placement. 8/19; patient presents for follow-up. We have been doing Hydrofera Blue and Santyl under Kerlix/Coban. Wound again is smaller. 8/26; patient presents for follow-up. We have been using Hydrofera Blue and Santyl under Kerlix/Coban. Wound is smaller. There is an [place] of epithelization now dividing the wound into 2 smaller ones. 9/3; patient presents for follow-up. We have been using Hydrofera Blue and Santyl under Kerlix/Coban. Wound is smaller. Dressing was stuck to the wound bed. 9/9; Patient presents for follow-up. We use PolyMem silver under Kerlix/Coban last week and the wound is healed. She has no issues or complaints. Patient History Information obtained from Patient. Family History Cancer - Father, Heart Disease - Father, Lung Disease - Father, No family history of Diabetes, Hereditary Spherocytosis, Hypertension, Seizures, Stroke, Thyroid Problems, Tuberculosis. Social History Former smoker, Marital Status - Widowed, Alcohol Use - Moderate, Drug Use - No History, Caffeine Use - Moderate. Medical History Eyes Denies history of Cataracts, Glaucoma, Optic Neuritis Ear/Nose/Mouth/Throat Denies history of Chronic sinus  problems/congestion, Middle ear problems Hematologic/Lymphatic Patient has history of Anemia Denies history of Hemophilia, Human Immunodeficiency Virus, Lymphedema, Sickle Cell Disease Respiratory Denies history of Aspiration, Asthma, Chronic Obstructive Pulmonary Disease (COPD), Pneumothorax, Sleep Apnea, Tuberculosis Cardiovascular Patient has history of Hypertension Denies history of Angina, Arrhythmia, Congestive Heart Failure, Coronary Artery Disease, Deep Vein Thrombosis, Hypotension, Myocardial Infarction, Peripheral Arterial Disease, Peripheral Venous Disease, Phlebitis, Vasculitis Gastrointestinal Denies history of Cirrhosis , Colitis, Crohns, Hepatitis A, Hepatitis B, Hepatitis C Endocrine Denies history of Type I Diabetes, Type II Diabetes Genitourinary Denies history of End Stage Renal Disease Immunological Denies history of Lupus Erythematosus, Raynauds, Scleroderma Integumentary (Skin) Denies history of History of Burn Musculoskeletal Patient has history of Osteoarthritis Denies history of Gout, Rheumatoid Arthritis, Osteomyelitis Neurologic Denies history of Dementia, Neuropathy, Quadriplegia, Paraplegia, Seizure Disorder Oncologic Denies history of Received Chemotherapy, Received Radiation Psychiatric Denies history of Anorexia/bulimia, Confinement Anxiety Objective Constitutional respirations regular, non-labored and within target range for patient.. Vitals Time Taken: 10:37 AM, Height: 60 in, Weight: 97 lbs, BMI: 18.9, Temperature: 98.2 F, Pulse: 79 bpm, Respiratory Rate: 18 breaths/min, Blood Pressure: 175/78 mmHg. Cardiovascular 2+ dorsalis pedis/posterior tibialis pulses. Psychiatric pleasant and cooperative. General Notes: [name] the anterior aspect of the left lower extremity there is epithelization to the previous wound site. No signs of infection. Venous stasis o dermatitis throughout the leg. Good edema control. Integumentary (Hair, Skin) Wound #4 status  is Healed - Epithelialized. Original cause of wound was Skin Tear/Laceration. The date acquired was: 05/22/2022. The wound has been in treatment 9 weeks. The wound is located on the Left,Anterior Lower Leg. The wound measures 0cm length x 0cm width x 0cm depth; 0cm^2 area and 0cm^3 [name] [name], [name] [name] ([id number]) [phone].pdf Page 6 of 8 volume.  There is Fat Layer (Subcutaneous Tissue) exposed. There is no tunneling or undermining noted. There is a medium amount of serosanguineous drainage noted. The wound margin is distinct with the outline attached to the wound base. There is small (1-33%) **red granulation within the wound bed. There is a large (67-100%) amount of necrotic tissue within the wound bed. The periwound skin appearance exhibited: Hemosiderin Staining. The periwound skin appearance did not exhibit: Callus, Crepitus, Excoriation, Induration, Rash, Scarring, Dry/Scaly, Maceration, Atrophie Blanche, Cyanosis, Ecchymosis, Mottled, Pallor, Rubor, Erythema. Periwound temperature was noted as No Abnormality. Assessment Active Problems ICD-10 Other early complications of trauma, initial encounter Non-pressure chronic ulcer of other part of left lower leg with fat layer exposed Chronic venous hypertension (idiopathic) with ulcer of left lower extremity Polymyalgia rheumatica Personal history of systemic steroid therapy Patient has done well with PolyMem silver under compression therapy. Her wound is healed. I recommended compression stockings daily however she states these are difficult for her to put on even a regular sock is difficult. We gave her Tubigrip in office and her granddaughter is present and states she can help her put this on in the morning and take it off at night. I recommended she do this for the next week to assure that the skin continues to toughen up. Since this is a trauma wound she will likely be fine without chronic compression therapy. She  may follow-up as needed. Plan Discharge From [facility]: Discharge from Wound Care Center - Please call us if you need [place]. Congratulations on healing!!!!! Anesthetic: (In clinic) Topical Lidocaine 4% applied to wound bed Bathing/ Shower/ Hygiene: May shower and wash wound with soap and water. Edema Control - Lymphedema / SCD / Other: Elevate legs to the level of the heart or above for 30 minutes daily and/or when sitting for 3-4 times a day throughout the day. Avoid standing for long periods of time. Patient to wear own compression stockings every day. 1. Discharge from clinic due to closed wound 2. Follow-up as needed 3. Tubigrip daily for the next week Electronic Signature(s) Signed: 10/01/2022 11:56:18 AM By: Geralyn Corwin DO Entered By: Geralyn Corwin on 10/01/2022 08:04:14 -------------------------------------------------------------------------------- HxROS Details Patient Name: Date of Service: [name] [name]. 10/01/2022 10:15 A M Medical Record Number: [id number] Patient Account Number: [id number] Date of Birth/Sex: Treating RN: June 27, 1928 (89 y.o. F) Primary Care Provider: [place], Betty Other Clinician: Referring Provider: Treating Provider/Extender: Richy Spradley [place], Betty Weeks in Treatment: 9 Information Obtained From Patient Eyes Medical History: Negative for: Cataracts; Glaucoma; Optic Neuritis Ear/Nose/Mouth/Throat [name] [name], [name] [name] ([id number]) [id number] Page 7 of 8 Medical History: Negative for: Chronic sinus problems/congestion; Middle ear problems Hematologic/Lymphatic Medical History: Positive for: Anemia Negative for: Hemophilia; Human Immunodeficiency Virus; Lymphedema; Sickle Cell Disease Respiratory Medical History: Negative for: Aspiration; Asthma; Chronic Obstructive Pulmonary Disease (COPD); Pneumothorax; Sleep Apnea; Tuberculosis Cardiovascular Medical History: Positive for:  Hypertension Negative for: Angina; Arrhythmia; Congestive Heart Failure; Coronary Artery Disease; Deep Vein Thrombosis; Hypotension; Myocardial Infarction; Peripheral Arterial Disease; Peripheral Venous Disease; Phlebitis; Vasculitis Gastrointestinal Medical History: Negative for: Cirrhosis ; Colitis; Crohns; Hepatitis A; Hepatitis B; Hepatitis C Endocrine Medical History: Negative for: Type I Diabetes; Type II Diabetes Genitourinary Medical History: Negative for: End Stage Renal Disease Immunological Medical History: Negative for: Lupus Erythematosus; Raynauds; Scleroderma Integumentary (Skin) Medical History: Negative for: History of Burn Musculoskeletal Medical History: Positive for: Osteoarthritis Negative for: Gout; Rheumatoid Arthritis; Osteomyelitis Neurologic Medical History: Negative for: Dementia; Neuropathy; Quadriplegia; Paraplegia; Seizure Disorder Oncologic Medical History: Negative for:  Received Chemotherapy; Received Radiation Psychiatric Medical History: Negative for: Anorexia/bulimia; Confinement Anxiety Immunizations Pneumococcal Vaccine: Received Pneumococcal Vaccination: No Implantable Devices None Family and Social History Cancer: Yes - Father; Diabetes: No; Heart Disease: Yes - Father; Hereditary Spherocytosis: No; Hypertension: No; Lung Disease: Yes - Father; Seizures: No; Stroke: No; Thyroid Problems: No; Tuberculosis: No; Former smoker; Marital Status - Widowed; Alcohol Use: Moderate; Drug Use: No History; Caffeine Sinagra, Aleina [name] ([id number]) [phone].pdf Page 8 of 8 Use: Moderate; Financial Concerns: No; Food, Clothing or Shelter Needs: No; Support System Lacking: No; Transportation Concerns: No Electronic Signature(s) Signed: 10/01/2022 11:56:18 AM By: Geralyn Corwin DO Entered By: Geralyn Corwin on 10/01/2022 08:01:38 -------------------------------------------------------------------------------- SuperBill  Details Patient Name: Date of Service: [name] [name]. 10/01/2022 Medical Record Number: [id number] Patient Account Number: [id number] Date of Birth/Sex: Treating RN: 1928/07/25 (89 y.o. Gevena Mart Primary Care Provider: [place], Betty Other Clinician: Referring Provider: Treating Provider/Extender: Amry Cathy [place], Betty Weeks in Treatment: 9 Diagnosis Coding ICD-10 Codes Code Description T79.8XXA Other early complications of trauma, initial encounter L97.822 Non-pressure chronic ulcer of other part of left lower leg with fat layer exposed I87.312 Chronic venous hypertension (idiopathic) with ulcer of left lower extremity M35.3 Polymyalgia rheumatica Z92.241 Personal history of systemic steroid therapy Facility Procedures : CPT4 Code: [id number] Description: 44034 - WOUND CARE VISIT-LEV 2 EST PT Modifier: Quantity: 1 Physician Procedures : CPT4 Code Description Modifier [id number] - WC PHYS LEVEL 3 - EST PT ICD-10 Diagnosis Description T79.8XXA Other early complications of trauma, initial encounter L97.822 Non-pressure chronic ulcer of other part of left lower leg with fat layer  exposed I87.312 Chronic venous hypertension (idiopathic) with ulcer of left lower extremity Z92.241 Personal history of systemic steroid therapy Quantity: 1 Electronic Signature(s) Signed: 10/01/2022 11:56:18 AM By: Geralyn Corwin DO Entered By: Geralyn Corwin on 10/01/2022 08:04:26

## 2022-10-03 NOTE — Progress Notes (Signed)
Garvey, Pallie [name] ([id number]) [id number] Page 1 of 7 Visit Report for 10/01/2022 Arrival Information Details Patient Name: Date of Service: [name], [name]. 10/01/2022 10:15 A M Medical Record Number: [id number] Patient Account Number: [id number] Date of Birth/Sex: Treating RN: 1928/04/03 (89 y.o. F) Primary Care Nardos Putnam: [place], Betty Other Clinician: Referring Moiz Ryant: Treating Aaidyn San/Extender: Hoffman, [name] [place], Betty Weeks in Treatment: 9 Visit Information History Since Last Visit Added or deleted any medications: No Patient Arrived: [name] Any new allergies or adverse reactions: No Arrival Time: 10:25 Had a fall or experienced change in No Accompanied By: granddaughter activities of daily living that may affect Transfer Assistance: None risk of falls: Patient Identification Verified: Yes Signs or symptoms of abuse/neglect since last visito No Secondary Verification Process Completed: Yes Hospitalized since last visit: No Patient Requires Transmission-Based Precautions: No Implantable device outside of the clinic excluding No Patient Has Alerts: No cellular tissue based products placed in the center since last visit: Has Dressing in Place as Prescribed: Yes Has Compression in Place as Prescribed: Yes Pain Present Now: No Electronic Signature(s) Signed: 10/03/2022 4:43:35 PM By: Thayer Dallas Entered By: Thayer Dallas on 10/01/2022 10:30:19 -------------------------------------------------------------------------------- Clinic Level of Care Assessment Details Patient Name: Date of Service: [name], [name]. 10/01/2022 10:15 A M Medical Record Number: [id number] Patient Account Number: [id number] Date of Birth/Sex: Treating RN: [birth year] (89 y.o. Gevena Mart Primary Care Cristel Rail: [place], Betty Other Clinician: Referring Atanacio Melnyk: Treating Quin Mathenia/Extender: Hoffman, [name] [place], Betty Weeks in Treatment: 9 Clinic  Level of Care Assessment Items TOOL 4 Quantity Score X- 1 0 Use when only an EandM is performed on FOLLOW-UP visit ASSESSMENTS - Nursing Assessment / Reassessment X- 1 10 Reassessment of Co-morbidities (includes updates in patient status) X- 1 5 Reassessment of Adherence to Treatment Plan ASSESSMENTS - Wound and Skin A ssessment / Reassessment X - Simple Wound Assessment / Reassessment - one wound 1 5 X- 1 5 Complex Wound Assessment / Reassessment - multiple wounds X- 1 10 Dermatologic / Skin Assessment (not related to wound area) ASSESSMENTS - Focused Assessment []  - 0 Circumferential Edema Measurements - multi extremities []  - 0 Nutritional Assessment / Counseling / Intervention [name], [name] ([id number]) [phone].pdf Page 2 of 7 []  - 0 Lower Extremity Assessment (monofilament, tuning fork, pulses) []  - 0 Peripheral Arterial Disease Assessment (using hand held doppler) ASSESSMENTS - Ostomy and/or Continence Assessment and Care []  - 0 Incontinence Assessment and Management []  - 0 Ostomy Care Assessment and Management (repouching, etc.) PROCESS - Coordination of Care X - Simple Patient / Family Education for ongoing care 1 15 []  - 0 Complex (extensive) Patient / Family Education for ongoing care X- 1 10 Staff obtains Chiropractor, Records, [name] Results / Process Orders est []  - 0 Staff telephones HHA, Nursing Homes / Clarify orders / etc []  - 0 Routine Transfer to another Facility (non-emergent condition) []  - 0 Routine Hospital Admission (non-emergent condition) []  - 0 New Admissions / Manufacturing engineer / Ordering NPWT Apligraf, etc. , []  - 0 Emergency Hospital Admission (emergent condition) X- 1 10 Simple Discharge Coordination []  - 0 Complex (extensive) Discharge Coordination PROCESS - Special Needs []  - 0 Pediatric / Minor Patient Management []  - 0 Isolation Patient Management []  - 0 Hearing / Language / Visual special  needs []  - 0 Assessment of Community assistance (transportation, D/C planning, etc.) []  - 0 Additional assistance / Altered mentation []  - 0 Support Surface(s) Assessment (bed, cushion, seat, etc.) INTERVENTIONS - Wound Cleansing /  Measurement []  - 0 Simple Wound Cleansing - one wound []  - 0 Complex Wound Cleansing - multiple wounds []  - 0 Wound Imaging (photographs - any number of wounds) []  - 0 Wound Tracing (instead of photographs) []  - 0 Simple Wound Measurement - one wound []  - 0 Complex Wound Measurement - multiple wounds INTERVENTIONS - Wound Dressings []  - 0 Small Wound Dressing one or multiple wounds []  - 0 Medium Wound Dressing one or multiple wounds []  - 0 Large Wound Dressing one or multiple wounds []  - 0 Application of Medications - topical []  - 0 Application of Medications - injection INTERVENTIONS - Miscellaneous []  - 0 External ear exam []  - 0 Specimen Collection (cultures, biopsies, blood, body fluids, etc.) []  - 0 Specimen(s) / Culture(s) sent or taken to Lab for analysis []  - 0 Patient Transfer (multiple staff / Nurse, adult / Similar devices) []  - 0 Simple Staple / Suture removal (25 or less) []  - 0 Complex Staple / Suture removal (26 or more) []  - 0 Hypo / Hyperglycemic Management (close monitor of Blood Glucose) [name], [name] ([id number]) [id number] Page 3 of 7 []  - 0 Ankle / Brachial Index (ABI) - do not check if billed separately X- 1 5 Vital Signs Has the patient been seen at the hospital within the last three years: Yes Total Score: 75 Level Of Care: New/Established - Level 2 Electronic Signature(s) Signed: 10/02/2022 3:48:27 PM By: Brenton Grills Entered By: Brenton Grills on 10/01/2022 10:55:56 -------------------------------------------------------------------------------- Lower Extremity Assessment Details Patient Name: Date of Service: [name]. 10/01/2022 10:15 A M Medical Record  Number: [id number] Patient Account Number: [id number] Date of Birth/Sex: Treating RN: 1928/07/28 (89 y.o. Gevena Mart Primary Care Alistar Mcenery: [place], Betty Other Clinician: Referring Jackie Russman: Treating Mesa Janus/Extender: Hoffman, [name] [place], Betty Weeks in Treatment: 9 Edema Assessment Assessed: [Left: No] [Right: No] [Left: Edema] [Right: :] Calf Left: Right: Point of Measurement: 27 cm From Medial Instep 27 cm Ankle Left: Right: Point of Measurement: 10 cm From Medial Instep 17 cm Vascular Assessment Pulses: Dorsalis Pedis Palpable: [Left:Yes] Extremity colors, hair growth, and conditions: Extremity Color: [Left:Hyperpigmented] Hair Growth on Extremity: [Left:No] Temperature of Extremity: [Left:Warm] Capillary Refill: [Left:< 3 seconds] Dependent Rubor: [Left:No No] Toe Nail Assessment Left: Right: Thick: Yes Discolored: Yes Deformed: Yes Improper Length and Hygiene: Yes Electronic Signature(s) Signed: 10/02/2022 3:48:27 PM By: Brenton Grills Entered By: Brenton Grills on 10/01/2022 10:40:02 [name], [name] ([id number]) [id number] Page 4 of 7 -------------------------------------------------------------------------------- Multi Wound Chart Details Patient Name: Date of Service: [name], [name]. 10/01/2022 10:15 A M Medical Record Number: [id number] Patient Account Number: [id number] Date of Birth/Sex: Treating RN: 03/19/1928 (89 y.o. F) Primary Care Shed Nixon: [place], Betty Other Clinician: Referring Jereline Ticer: Treating Rogan Wigley/Extender: Hoffman, [name] [place], Betty Weeks in Treatment: 9 Vital Signs Height(in): 60 Pulse(bpm): 79 Weight(lbs): 97 Blood Pressure(mmHg): 175/78 Body Mass Index(BMI): 18.9 Temperature(F): 98.2 Respiratory Rate(breaths/min): 18 [4:Photos: No Photos Left, Anterior Lower Leg Wound Location: Skin [name] ear/Laceration Wounding Event: Trauma, Other Primary Etiology: Anemia, Hypertension,  Osteoarthritis N/A Comorbid History: 05/22/2022 Date Acquired: 9 Weeks of Treatment: Healed - Epithelialized  Wound Status: No Wound Recurrence: 0x0x0 Measurements L x W x D (cm) 0 A (cm) : rea 0 Volume (cm) : 100.00% % Reduction in A rea: 100.00% % Reduction in Volume: Full Thickness With Exposed Support N/A Classification: Structures Medium Exudate Amount:  Serosanguineous Exudate Type: red, brown Exudate Color: Distinct, outline attached Wound Margin: Small (1-33%) Granulation Amount: Red Granulation Quality:  Large (67-100%) Necrotic Amount: Fat Layer (Subcutaneous Tissue): Yes N/A Exposed Structures:  Fascia: No Tendon: No Muscle: No Joint: No Bone: No None Epithelialization: Excoriation: No Periwound Skin Texture: Induration: No Callus: No Crepitus: No Rash: No Scarring: No Maceration: No Periwound Skin Moisture: Dry/Scaly: No Hemosiderin Staining:  Yes Periwound Skin Color: Atrophie Blanche: No Cyanosis: No Ecchymosis: No Erythema: No Mottled: No Pallor: No Rubor: No No Abnormality Temperature:] [N/A:N/A N/A N/A N/A N/A N/A N/A N/A N/A N/A N/A N/A N/A N/A N/A N/A N/A N/A N/A N/A N/A N/A N/A N/A  N/A] Treatment Notes Electronic Signature(s) Signed: 10/01/2022 11:56:18 AM By: Geralyn Corwin DO Entered By: Geralyn Corwin on 10/01/2022 10:58:15 [name], [name] ([id number]) [id number] Page 5 of 7 -------------------------------------------------------------------------------- Multi-Disciplinary Care Plan Details Patient Name: Date of Service: [name], [name]. 10/01/2022 10:15 A M Medical Record Number: [id number] Patient Account Number: [id number] Date of Birth/Sex: Treating RN: 12-01-1928 (89 y.o. Gevena Mart Primary Care Tura Roller: [place], Betty Other Clinician: Referring Nikolaos Maddocks: Treating Madyn Ivins/Extender: Hoffman, [name] [place], Betty Weeks in Treatment: 9 Active Inactive Electronic Signature(s) Signed: 10/02/2022 3:48:27 PM By: Brenton Grills Entered By: Brenton Grills on 10/01/2022 11:03:19 -------------------------------------------------------------------------------- Pain Assessment Details Patient Name: Date of Service: [name]. 10/01/2022 10:15 A M Medical Record Number: [id number] Patient Account Number: [id number] Date of Birth/Sex: Treating RN: [birth year] (89 y.o. Gevena Mart Primary Care Yanette Tripoli: [place], Betty Other Clinician: Referring Akaya Proffit: Treating Tashayla Therien/Extender: Hoffman, [name] [place], Betty Weeks in Treatment: 9 Active Problems Location of Pain Severity and Description of Pain Patient Has Paino No Site Locations Pain Management and Medication Current Pain Management: Electronic Signature(s) Signed: 10/02/2022 3:48:27 PM By: Brenton Grills Entered By: Brenton Grills on 10/01/2022 10:39:30 Collingsworth, Reah [name] ([id number]) [id number] Page 6 of 7 -------------------------------------------------------------------------------- Patient/Caregiver Education Details Patient Name: Date of Service: [name] NDSO [name], LOFT. 9/9/2024andnbsp10:15 A M Medical Record Number: [id number] Patient Account Number: [id number] Date of Birth/Gender: Treating RN: Dec 26, 1928 (89 y.o. Gevena Mart Primary Care Physician: [place], Betty Other Clinician: Referring Physician: Treating Physician/Extender: Hoffman, [name] [place], Betty Weeks in Treatment: 9 Education Assessment Education Provided To: Patient and Caregiver Education Topics Provided Wound/Skin Impairment: Methods: Explain/Verbal Responses: State content correctly Electronic Signature(s) Signed: 10/02/2022 3:48:27 PM By: Brenton Grills Entered By: Brenton Grills on 10/01/2022 10:45:46 -------------------------------------------------------------------------------- Wound Assessment Details Patient Name: Date of Service: [name]. 10/01/2022 10:15 A M Medical Record Number:  [id number] Patient Account Number: [id number] Date of Birth/Sex: Treating RN: [birth year] (89 y.o. Gevena Mart Primary Care Venkat Ankney: [place], Betty Other Clinician: Referring Audre Cenci: Treating Allie Gerhold/Extender: Hoffman, [name] [place], Betty Weeks in Treatment: 9 Wound Status Wound Number: 4 Primary Etiology: Trauma, Other Wound Location: Left, Anterior Lower Leg Wound Status: Healed - Epithelialized Wounding Event: Skin Tear/Laceration Comorbid History: Anemia, Hypertension, Osteoarthritis Date Acquired: 05/22/2022 Weeks Of Treatment: 9 Clustered Wound: No Wound Measurements Length: (cm) Width: (cm) Depth: (cm) Area: (cm) Volume: (cm) 0 % Reduction in Area: 100% 0 % Reduction in Volume: 100% 0 Epithelialization: None 0 Tunneling: No 0 Undermining: No Wound Description Classification: Full Thickness With Exposed Support Structures Wound Margin: Distinct, outline attached Exudate Amount: Medium Exudate Type: Serosanguineous Exudate Color: red, brown Foul Odor After Cleansing: No Slough/Fibrino Yes Wound Bed Granulation Amount: Small (1-33%) Exposed Structure Granulation Quality: Red Fascia Exposed: No Agent, Chanler [name] ([id number]) [phone].pdf Page 7 of 7 Necrotic Amount: Large (67-100%) Fat Layer (Subcutaneous Tissue) Exposed: Yes Tendon Exposed: No Muscle Exposed: No Joint Exposed: No Bone Exposed:  No Periwound Skin Texture Texture Color No Abnormalities Noted: No No Abnormalities Noted: No Callus: No Atrophie Blanche: No Crepitus: No Cyanosis: No Excoriation: No Ecchymosis: No Induration: No Erythema: No Rash: No Hemosiderin Staining: Yes Scarring: No Mottled: No Pallor: No Moisture Rubor: No No Abnormalities Noted: No Dry / Scaly: No Temperature / Pain Maceration: No Temperature: No Abnormality Electronic Signature(s) Signed: 10/02/2022 3:48:27 PM By: Brenton Grills Entered By: Brenton Grills on 10/01/2022  10:50:54 -------------------------------------------------------------------------------- Vitals Details Patient Name: Date of Service: [name]. 10/01/2022 10:15 A M Medical Record Number: [id number] Patient Account Number: [id number] Date of Birth/Sex: Treating RN: February 20, 1928 (89 y.o. Gevena Mart Primary Care Donyale Berthold: [place], Betty Other Clinician: Referring Brinly Maietta: Treating Narcissa Melder/Extender: Hoffman, [name] [place], Betty Weeks in Treatment: 9 Vital Signs Time Taken: 10:37 Temperature (F): 98.2 Height (in): 60 Pulse (bpm): 79 Weight (lbs): 97 Respiratory Rate (breaths/min): 18 Body Mass Index (BMI): 18.9 Blood Pressure (mmHg): 175/78 Reference Range: 80 - 120 mg / dl Electronic Signature(s) Signed: 10/02/2022 3:48:27 PM By: Brenton Grills Entered By: Brenton Grills on 10/01/2022 10:39:21

## 2022-10-08 ENCOUNTER — Ambulatory Visit (HOSPITAL_BASED_OUTPATIENT_CLINIC_OR_DEPARTMENT_OTHER): Payer: Medicare Other | Admitting: Internal Medicine

## 2022-10-10 DIAGNOSIS — H52203 Unspecified astigmatism, bilateral: Secondary | ICD-10-CM | POA: Diagnosis not present

## 2022-10-10 DIAGNOSIS — Z961 Presence of intraocular lens: Secondary | ICD-10-CM | POA: Diagnosis not present

## 2022-10-10 DIAGNOSIS — H353132 Nonexudative age-related macular degeneration, bilateral, intermediate dry stage: Secondary | ICD-10-CM | POA: Diagnosis not present

## 2023-01-28 ENCOUNTER — Encounter: Payer: Self-pay | Admitting: Family Medicine

## 2023-01-28 ENCOUNTER — Ambulatory Visit (INDEPENDENT_AMBULATORY_CARE_PROVIDER_SITE_OTHER): Payer: Medicare Other | Admitting: Family Medicine

## 2023-01-28 VITALS — BP 120/70 | HR 86 | Resp 16 | Ht 60.0 in | Wt 93.4 lb

## 2023-01-28 DIAGNOSIS — J310 Chronic rhinitis: Secondary | ICD-10-CM | POA: Diagnosis not present

## 2023-01-28 DIAGNOSIS — E559 Vitamin D deficiency, unspecified: Secondary | ICD-10-CM

## 2023-01-28 DIAGNOSIS — D510 Vitamin B12 deficiency anemia due to intrinsic factor deficiency: Secondary | ICD-10-CM | POA: Diagnosis not present

## 2023-01-28 DIAGNOSIS — I10 Essential (primary) hypertension: Secondary | ICD-10-CM

## 2023-01-28 DIAGNOSIS — I4891 Unspecified atrial fibrillation: Secondary | ICD-10-CM

## 2023-01-28 DIAGNOSIS — Z23 Encounter for immunization: Secondary | ICD-10-CM | POA: Diagnosis not present

## 2023-01-28 DIAGNOSIS — M353 Polymyalgia rheumatica: Secondary | ICD-10-CM

## 2023-01-28 DIAGNOSIS — N1831 Chronic kidney disease, stage 3a: Secondary | ICD-10-CM

## 2023-01-28 DIAGNOSIS — F1020 Alcohol dependence, uncomplicated: Secondary | ICD-10-CM

## 2023-01-28 LAB — BASIC METABOLIC PANEL
BUN: 37 mg/dL — ABNORMAL HIGH (ref 6–23)
CO2: 31 meq/L (ref 19–32)
Calcium: 9.3 mg/dL (ref 8.4–10.5)
Chloride: 100 meq/L (ref 96–112)
Creatinine, Ser: 0.96 mg/dL (ref 0.40–1.20)
GFR: 51.26 mL/min — ABNORMAL LOW (ref >60.00)
Glucose, Bld: 138 mg/dL — ABNORMAL HIGH (ref 70–99)
Potassium: 4.4 meq/L (ref 3.5–5.1)
Sodium: 140 meq/L (ref 135–145)

## 2023-01-28 LAB — VITAMIN D 25 HYDROXY (VIT D DEFICIENCY, FRACTURES): VITD: 36.95 ng/mL (ref 30.00–100.00)

## 2023-01-28 LAB — MICROALBUMIN / CREATININE URINE RATIO
Creatinine,U: 50.5 mg/dL
Microalb Creat Ratio: 2 mg/g (ref 0.0–30.0)
Microalb, Ur: 1 mg/dL (ref 0.0–1.9)

## 2023-01-28 LAB — TSH: TSH: 1.46 u[IU]/mL (ref 0.35–5.50)

## 2023-01-28 LAB — MAGNESIUM: Magnesium: 1.7 mg/dL (ref 1.5–2.5)

## 2023-01-28 LAB — VITAMIN B12: Vitamin B-12: 1469 pg/mL — ABNORMAL HIGH (ref 211–911)

## 2023-01-28 MED ORDER — IPRATROPIUM BROMIDE 0.06 % NA SOLN
2.0000 | Freq: Two times a day (BID) | NASAL | 2 refills | Status: DC | PRN
Start: 2023-01-28 — End: 2023-06-21

## 2023-01-28 NOTE — Assessment & Plan Note (Addendum)
 Reports that she has not had any alcohol since 06/2022. Encouraged to continue alcohol avoidance, adverse effects briefly reviewed.

## 2023-01-28 NOTE — Progress Notes (Signed)
 HPI: [name] is a 89 y.o. female with a PMHx significant for CKD III, PMR on prednisone , and HTN, who is here today with her daughter for chronic disease management.  Last seen on 07/17/2022 No new problems since her last visit. She has since followed with the wound clinic. She finished wound care treatments on 10/01/2022 and says her left lower extremity wound is fully healed. She is sleeping better because she is not longer having pain.  Polymyalgia Rheumatica:  She is currently taking 2 tablets (5 mg) of prednisone  2.5 mg daily. She has been on this medication for years. Dx'ed by rheumatologist years ago, 2009. She has tolerated mediation well, no side effects reported. No falls in the past 1-2 years.  Lab Results  Component Value Date   CRP 12.6 04/19/2021   Lab Results  Component Value Date   ESRSEDRATE 29 04/19/2021   CKD III: Negative for gross hematuria, foam in urine, or decreased urine output.  Lab Results  Component Value Date   NA 140 10/24/2021   CL 102 10/24/2021   K 4.3 10/24/2021   CO2 30 10/24/2021   BUN 15 10/24/2021   CREATININE 1.08 10/24/2021   GFR 44.90 (L) 10/24/2021   CALCIUM  9.4 10/24/2021   PHOS 4.2 07/03/2010   ALBUMIN 3.8 10/31/2020   GLUCOSE 84 10/24/2021   Pernicious anemia: Lab Results  Component Value Date   VITAMINB12 935 12/07/2019  She takes 1000 mcg of B12 daily.   Atrial Fibrillation:  According to patient, she has seen cardiology and declined treatment.  HTN on non pharmacologic treatment. Denies severe/frequent headache, visual changes, chest pain, palpitation, focal weakness, or worsening edema. She does not believe she is having problems with her memory, something her daughter has voiced concerns about.  -She still occasionally uses her albuterol  108 (90 base) mcg/act inhaler. She uses it with a spacer. She is not sure if she needs to continue this medication, started because reported SOB. She says she is no  longer having SOB, but is occasionally aware of her breathing. She denies associated wheezing,cough, or chest pain with these episodes.   -LE edema: She takes furosemide  20 mg as needed, not daily.   V-it D deficiency: She is not taking vitamin D  supplementation.   She has not drunk any alcohol since 06/2022, use to drink wine daily at bedtime.   -Patient mentions she is having to get up several times at night to urinate, no more than usual.  She also complains of chronic sinus congestion and rhinorrhea.  Problem has been going on for years. She also endorses occasional frontal pressure headaches and dry mouth when waking up. She doesn't currently use any nasal sprays.  She is not longer taking benadryl .   Review of Systems  Constitutional:  Positive for fatigue. Negative for activity change, chills and fever.  Gastrointestinal:  Negative for abdominal pain, nausea and vomiting.  Endocrine: Negative for cold intolerance and heat intolerance.  Genitourinary:  Negative for dysuria.  Musculoskeletal:  Positive for arthralgias and gait problem.       LE/feet cramps.  Skin:  Negative for rash.  Allergic/Immunologic: Positive for environmental allergies.  Neurological:  Negative for syncope and facial asymmetry.  Psychiatric/Behavioral:  Negative for confusion and hallucinations.   See other pertinent positives and negatives in HPI.  Current Outpatient Medications on File Prior to Visit  Medication Sig Dispense Refill   albuterol  (VENTOLIN  HFA) 108 (90 Base) MCG/ACT inhaler Inhale 2 puffs into the  lungs every 6 (six) hours as needed for wheezing or shortness of breath. 8 g 0   CALCIUM  PO Take 1,300 mg by mouth daily.     Cyanocobalamin  (VITAMIN B-12) 1000 MCG SUBL Place 1,000 mcg under the tongue daily.      diphenhydrAMINE  (BENADRYL ) 25 MG tablet Take 25 mg by mouth at bedtime as needed for sleep.     furosemide  (LASIX ) 20 MG tablet TAKE 1 TABLET BY MOUTH DAILY AS  NEEDED 100 tablet 2    Oyster Shell (OYSTER CALCIUM ) 500 MG TABS tablet Take 1,000 mg of elemental calcium  by mouth daily.     predniSONE  (DELTASONE ) 2.5 MG tablet Take 1-2 tablets (2.5-5 mg total) by mouth daily with breakfast. 180 tablet 3   vitamin C  (ASCORBIC ACID ) 250 MG tablet Take 500 mg by mouth daily.     No current facility-administered medications on file prior to visit.   Past Medical History:  Diagnosis Date   Alcohol abuse    Emphysema of lung (HCC) 01/26/2009   pt. unsure of this reports as of 07/31/2012   Fracture of fifth metatarsal bone of right foot 08/29/2012   Gout 08/11/2012   Hyperlipemia 03/27/2007   Hypertension    Osteoporosis 07/12/2006   Pernicious anemia 10/25/2006   Polymyalgia rheumatica (HCC) 03/27/2007   sees Dr. Everlean in rheumatology   Vitamin D  deficiency 07/12/2006   Allergies  Allergen Reactions   Naproxen Other (See Comments)    Severe pain in joints and hands   Bactrim  [Sulfamethoxazole -Trimethoprim ]     She had episode of confusion after starting bactrim ; uncertain if related. Got dehydrated, ARF.    Doxycycline  Other (See Comments)    Burning sensation noted in the tongue and vaginal irritation   Aspirin Other (See Comments)    threw up blood   Codeine Other (See Comments)    threw up blood    Social History   Socioeconomic History   Marital status: Married    Spouse name: Not on file   Number of children: 2   Years of education: Not on file   Highest education level: Not on file  Occupational History   Not on file  Tobacco Use   Smoking status: Former    Current packs/day: 0.00    Average packs/day: 2.0 packs/day for 58.5 years (116.9 ttl pk-yrs)    Types: Cigarettes    Start date: 01/23/1948    Quit date: 07/12/2006    Years since quitting: 16.5   Smokeless tobacco: Never  Vaping Use   Vaping status: Never Used  Substance and Sexual Activity   Alcohol use: No    Alcohol/week: 10.0 standard drinks of alcohol    Types: 10 Shots of liquor per week     Comment: quit completely - Jan 09, 2011   Drug use: No   Sexual activity: Never  Other Topics Concern   Not on file  Social History Narrative   Work or School: none      Home Situation: lives with husband - drives, husband takes care of finances, dresses herself, takes care of housework and cooking      Spiritual Beliefs: [employer]      Lifestyle: uses cane when goes out, exercises sometimes - diet healthy      03/20/2018: Lives alone in one-level townhouse with dog. Husband passed away in fall of 01-08-2018. Coping well overall.   Has two daughters, both of whom live close by and are supportive.   Active in church  Enjoys gardening         Social Drivers of Corporate Investment Banker Strain: Low Risk  (08/01/2022)   Overall Financial Resource Strain (CARDIA)    Difficulty of Paying Living Expenses: Not hard at all  Food Insecurity: No Food Insecurity (08/01/2022)   Hunger Vital Sign    Worried About Running Out of Food in the Last Year: Never true    Ran Out of Food in the Last Year: Never true  Transportation Needs: No Transportation Needs (08/01/2022)   PRAPARE - Administrator, Civil Service (Medical): No    Lack of Transportation (Non-Medical): No  Physical Activity: Insufficiently Active (08/01/2022)   Exercise Vital Sign    Days of Exercise per Week: 5 days    Minutes of Exercise per Session: 20 min  Stress: No Stress Concern Present (08/01/2022)   [employer] of Occupational Health - Occupational Stress Questionnaire    Feeling of Stress : Not at all  Social Connections: Moderately Integrated (08/01/2022)   Social Connection and Isolation Panel [NHANES]    Frequency of Communication with Friends and Family: More than three times a week    Frequency of Social Gatherings with Friends and Family: More than three times a week    Attends Religious Services: More than 4 times per year    Active Member of [employer] or Organizations: Yes    Attends Tax Inspector Meetings: More than 4 times per year    Marital Status: Widowed   Today's Vitals   01/28/23 1307  BP: 120/70  Pulse: 86  Resp: 16  SpO2: 94%  Weight: 93 lb 6 oz (42.4 kg)  Height: 5' (1.524 m)   Body mass index is 18.24 kg/m.  Physical Exam Vitals and nursing note reviewed.  Constitutional:      General: She is not in acute distress.    Appearance: She is well-developed.  HENT:     Head: Normocephalic and atraumatic.     Mouth/Throat:     Mouth: Mucous membranes are moist.     Dentition: Has dentures.     Pharynx: Oropharynx is clear.  Eyes:     Conjunctiva/sclera: Conjunctivae normal.  Cardiovascular:     Rate and Rhythm: Normal rate. Rhythm irregular.     Heart sounds: No murmur heard.    Comments: DP pulses palpable. Pulmonary:     Effort: Pulmonary effort is normal. No respiratory distress.     Breath sounds: Normal breath sounds.  Abdominal:     Palpations: Abdomen is soft. There is no mass.     Tenderness: There is no abdominal tenderness.  Musculoskeletal:     Right lower leg: No edema.     Left lower leg: No edema.  Skin:    General: Skin is warm.     Findings: No erythema or rash.  Neurological:     General: No focal deficit present.     Mental Status: She is alert and oriented to person, place, and time.     Comments: Unstable gait, assisted with a cane.  Psychiatric:        Mood and Affect: Mood and affect normal.   ASSESSMENT AND PLAN:  Ms. Obarr was seen today for chronic follow up.  Lab Results  Component Value Date   NA 140 01/28/2023   CL 100 01/28/2023   K 4.4 01/28/2023   CO2 31 01/28/2023   BUN 37 (H) 01/28/2023   CREATININE 0.96 01/28/2023   GFR  51.26 (L) 01/28/2023   CALCIUM  9.3 01/28/2023   PHOS 4.2 07/03/2010   ALBUMIN 3.8 10/31/2020   GLUCOSE 138 (H) 01/28/2023   Lab Results  Component Value Date   TSH 1.46 01/28/2023   Lab Results  Component Value Date   MICROALBUR 1.0 01/28/2023   MICROALBUR 1.1  08/11/2021   Lab Results  Component Value Date   VITAMINB12 1,469 (H) 01/28/2023   Lab Results  Component Value Date   VD25OH 36.95 01/28/2023   Uncomplicated alcohol dependence (HCC) Assessment & Plan: Reports that she has not had any alcohol since 06/2022. Encouraged to continue alcohol avoidance, adverse effects briefly reviewed.   Stage 3a chronic kidney disease (HCC) Assessment & Plan: Problem has been stable,Cr 1.08-1.2, e GFR 40's-low 50's. Last e GFR 44.9 and Xr 1.08. Low salt diet, NSAID's avoidance, and adequate hydration to continue. As far as problem is stable, we can continue following annually.  Orders: -     Basic metabolic panel; Future -     Microalbumin / creatinine urine ratio; Future  POLYMYALGIA RHEUMATICA Assessment & Plan: Currently on Prednisone  2.5 mg 2 tabs daily, which she has taken for several years.  F/U in a year, before if needed.  Orders: -     Magnesium ; Future -     TSH; Future  Essential hypertension Assessment & Plan: BP adequately controlled. Continue non pharmacologic treatment. Monitor BP at home.  Orders: -     Basic metabolic panel; Future  Pernicious anemia Assessment & Plan: Continue B12 supplementation. Further recommendations according to B12 result.  Orders: -     Vitamin B12; Future  Vitamin D  deficiency, unspecified Assessment & Plan: She is not on vit D supplementation.  Orders: -     VITAMIN D  25 Hydroxy (Vit-D Deficiency, Fractures); Future  Chronic rhinitis Assessment & Plan: Atrovent  nasal spray may help. Nasal saline irrigations as needed. I do not recommend systemic antihistaminic.  Orders: -     Ipratropium Bromide ; Place 2 sprays into both nostrils 2 (two) times daily as needed for rhinitis.  Dispense: 15 mL; Refill: 2  Atrial fibrillation, unspecified type [facility]) Assessment & Plan: Persistent and overall asymptomatic. 10/2020 long term heart monitor showed atrial fib and SR, HR overall  controlled, Eliquis was recommended but she declined. She is not interested in cardiology follow up and does not want to start oral anticoagulation. She is aware of risks as well as possible side effects of medications. Instructed about warning signs.   Need for influenza vaccination -     Flu Vaccine Trivalent High Dose (Fluad)  I spent a total of 43 minutes in both face to face and non face to face activities for this visit on the date of this encounter. During this time history was obtained and documented, examination was performed, prior labs/imaging reviewed, and assessment/plan discussed.  Return in about 1 year (around 01/28/2024) for chronic problems.  I, Leonce PARAS Wierda, acting as a scribe for Carsyn Taubman, MD., have documented all relevant documentation on the behalf of Wah Sabic, MD, as directed by  Ciel Chervenak, MD while in the presence of Shenell Rogalski, MD.   I, Calen Geister, have reviewed all documentation for this visit. The documentation on 01/28/23 for the exam, diagnosis, procedures, and orders are all accurate and complete.  Taresa Montville G. Jkai Arwood, MD  [facility]. [place] office.

## 2023-01-28 NOTE — Patient Instructions (Addendum)
 A few things to remember from today's visit:  Essential hypertension - Plan: Basic metabolic panel  Stage 3a chronic kidney disease (HCC) - Plan: Basic metabolic panel, Microalbumin / creatinine urine ratio  POLYMYALGIA RHEUMATICA  Pernicious anemia - Plan: Vitamin B12  Vitamin D  deficiency, unspecified - Plan: VITAMIN D  25 Hydroxy (Vit-D Deficiency, Fractures)  Chronic rhinitis - Plan: ipratropium (ATROVENT ) 0.06 % nasal spray  Atrovent  nasal spray 1-2 times daily to try for runny nose. Nasal saline irrigations as needed through the day.  If you need refills for medications you take chronically, please call your pharmacy. Do not use My Chart to request refills or for acute issues that need immediate attention. If you send a my chart message, it may take a few days to be addressed, specially if I am not in the office.  Please be sure medication list is accurate. If a new problem present, please set up appointment sooner than planned today.

## 2023-01-28 NOTE — Assessment & Plan Note (Addendum)
 Problem has been stable,Cr 1.08-1.2, e GFR 40's-low 50's. Last e GFR 44.9 and Xr 1.08. Low salt diet, NSAID's avoidance, and adequate hydration to continue. As far as problem is stable, we can continue following annually.

## 2023-01-28 NOTE — Assessment & Plan Note (Signed)
 Atrovent nasal spray may help. Nasal saline irrigations as needed. I do not recommend systemic antihistaminic.

## 2023-01-28 NOTE — Assessment & Plan Note (Signed)
 BP adequately controlled. Continue non pharmacologic treatment. Monitor BP at home.

## 2023-01-28 NOTE — Assessment & Plan Note (Addendum)
 Persistent and overall asymptomatic. 10/2020 long term heart monitor showed atrial fib and SR, HR overall controlled, Eliquis was recommended but she declined. She is not interested in cardiology follow up and does not want to start oral anticoagulation. She is aware of risks as well as possible side effects of medications. Instructed about warning signs.

## 2023-01-28 NOTE — Assessment & Plan Note (Addendum)
 Currently on Prednisone 2.5 mg 2 tabs daily, which she has taken for several years.  F/U in a year, before if needed.

## 2023-01-28 NOTE — Assessment & Plan Note (Signed)
Continue B12 supplementation. Further recommendations according to B12 result. 

## 2023-01-28 NOTE — Assessment & Plan Note (Signed)
 She is not on vit D supplementation.

## 2023-02-07 ENCOUNTER — Telehealth: Payer: Self-pay | Admitting: *Deleted

## 2023-02-07 NOTE — Telephone Encounter (Signed)
Copied from CRM [id number]. Topic: Clinical - Medical Advice >> Feb 07, 2023 10:14 AM Joanette Gula wrote: Patient wants to know if she need to get a RSVP injection.  Her number is [phone]

## 2023-02-08 NOTE — Telephone Encounter (Signed)
RSV vaccine is recommended for everybody 17 an older. Thanks, BJ

## 2023-02-11 NOTE — Telephone Encounter (Signed)
I called and spoke with patient's daughter, she is aware of message below.

## 2023-03-11 ENCOUNTER — Other Ambulatory Visit: Payer: Self-pay | Admitting: Family Medicine

## 2023-03-11 DIAGNOSIS — M353 Polymyalgia rheumatica: Secondary | ICD-10-CM

## 2023-05-24 ENCOUNTER — Other Ambulatory Visit: Payer: Self-pay | Admitting: Family Medicine

## 2023-05-24 DIAGNOSIS — R0602 Shortness of breath: Secondary | ICD-10-CM

## 2023-06-21 ENCOUNTER — Other Ambulatory Visit: Payer: Self-pay | Admitting: Family Medicine

## 2023-06-21 DIAGNOSIS — J310 Chronic rhinitis: Secondary | ICD-10-CM

## 2023-07-14 DIAGNOSIS — N390 Urinary tract infection, site not specified: Secondary | ICD-10-CM | POA: Diagnosis not present

## 2023-07-14 DIAGNOSIS — M353 Polymyalgia rheumatica: Secondary | ICD-10-CM | POA: Diagnosis not present

## 2023-07-14 DIAGNOSIS — Z131 Encounter for screening for diabetes mellitus: Secondary | ICD-10-CM | POA: Diagnosis not present

## 2023-08-05 ENCOUNTER — Ambulatory Visit (INDEPENDENT_AMBULATORY_CARE_PROVIDER_SITE_OTHER): Payer: Medicare Other

## 2023-08-05 VITALS — Ht 60.0 in | Wt 93.0 lb

## 2023-08-05 DIAGNOSIS — Z0000 Encounter for general adult medical examination without abnormal findings: Secondary | ICD-10-CM | POA: Diagnosis not present

## 2023-08-05 NOTE — Patient Instructions (Signed)
 [name] , Thank you for taking time out of your busy schedule to complete your Annual Wellness Visit with me. I enjoyed our conversation and look forward to speaking with you again next year. I, as well as your care team,  appreciate your ongoing commitment to your health goals. Please review the following plan we discussed and let me know if I can assist you in the future. Your Game plan/ To Do List    Referrals: If you haven't heard from the office you've been referred to, please reach out to them at the phone provided.    Follow up Visits: Next Medicare AWV with our clinical staff: 08/10/24 @ 9:20a   Have you seen your provider in the last 6 months (3 months if uncontrolled diabetes)? 01/28/23 Next Office Visit with your provider:    Clinician Recommendations:  Aim for 30 minutes of exercise or brisk walking, 6-8 glasses of water, and 5 servings of fruits and vegetables each day.        This is a list of the screening recommended for you and due dates:  Health Maintenance  Topic Date Due   Zoster (Shingles) Vaccine (1 of 2) Never done   COVID-19 Vaccine (6 - 2024-25 season) 09/23/2022   Flu Shot  08/23/2023   Medicare Annual Wellness Visit  08/04/2024   DTaP/Tdap/Td vaccine (4 - Td or Tdap) 07/28/2030   Pneumococcal Vaccine for age over 57  Completed   DEXA scan (bone density measurement)  Completed   Hepatitis B Vaccine  Aged Out   HPV Vaccine  Aged Out   Meningitis B Vaccine  Aged Out    Advanced directives: (Copy Requested) Please bring a copy of your health care power of attorney and living will to the office to be added to your chart at your convenience. You can mail to [facility] [address] [place], [place] [address] or email to [email or web address] Advance Care Planning is important because it:  [x]  Makes sure you receive the medical care that is consistent with your values, goals, and preferences  [x]  It provides guidance to your family  and loved ones and reduces their decisional burden about whether or not they are making the right decisions based on your wishes.  Follow the link provided in your after visit summary or read over the paperwork we have mailed to you to help you started getting your Advance Directives in place. If you need assistance in completing these, please reach out to us  so that we can help you!  See attachments for Preventive Care and Fall Prevention Tips.

## 2023-08-05 NOTE — Progress Notes (Signed)
 Subjective:   [name] is a 89 y.o. who presents for a Medicare Wellness preventive visit.  As a reminder, Annual Wellness Visits don't include a physical exam, and some assessments may be limited, especially if this visit is performed virtually. We may recommend an in-person follow-up visit with your provider if needed.  Visit Complete: Virtual I connected with  [name] [employer] on 08/05/23 by a audio enabled telemedicine application and verified that I am speaking with the correct person using two identifiers.  Patient Location: Home  Provider Location: Home Office  I discussed the limitations of evaluation and management by telemedicine. The patient expressed understanding and agreed to proceed.  Vital Signs: Because this visit was a virtual/telehealth visit, some criteria may be missing or patient reported. Any vitals not documented were not able to be obtained and vitals that have been documented are patient reported.    Persons Participating in Visit: Patient.  AWV Questionnaire: No: Patient Medicare AWV questionnaire was not completed prior to this visit.  Cardiac Risk Factors include: advanced age (>28men, >46 women);hypertension     Objective:    Today's Vitals   08/05/23 0932  Weight: 93 lb (42.2 kg)  Height: 5' (1.524 m)   Body mass index is 18.16 kg/m.     08/05/2023    9:39 AM 08/01/2022    9:27 AM 07/28/2021   12:45 PM 08/31/2020    5:00 AM 08/30/2020    8:02 PM 07/27/2020   11:28 AM 03/21/2018    8:33 AM  Advanced Directives  Does Patient Have a Medical Advance Directive? Yes Yes Yes Yes Yes Yes Yes   Type of Estate agent of [place];Living will Healthcare Power of [place];Living will Healthcare Power of [place];Living will Healthcare Power of [place];Living will Living will;Healthcare Power of [employer] Power of [place];Living will Healthcare Power of [place];Living will  Does patient want to make changes to  medical advance directive?   No - Patient declined No - Patient declined   No - Patient declined   Copy of Healthcare Power of Attorney in Chart? No - copy requested No - copy requested No - copy requested No - copy requested  No - copy requested No - copy requested      Data saved with a previous flowsheet row definition    Current Medications (verified) Outpatient Encounter Medications as of 08/05/2023  Medication Sig   albuterol  (VENTOLIN  HFA) 108 (90 Base) MCG/ACT inhaler INHALE 2 PUFFS BY MOUTH EVERY 6 HOURS AS NEEDED FOR WHEEZING FOR SHORTNESS OF BREATH   CALCIUM  PO Take 1,300 mg by mouth daily.   Cyanocobalamin  (VITAMIN B-12) 1000 MCG SUBL Place 1,000 mcg under the tongue daily.    furosemide  (LASIX ) 20 MG tablet TAKE 1 TABLET BY MOUTH DAILY AS  NEEDED   ipratropium (ATROVENT ) 0.06 % nasal spray USE 2 SPRAY(S) IN EACH NOSTRIL TWICE DAILY AS NEEDED FOR RUNNY NOSE   Oyster Shell (OYSTER CALCIUM ) 500 MG TABS tablet Take 1,000 mg of elemental calcium  by mouth daily.   predniSONE  (DELTASONE ) 2.5 MG tablet TAKE 1 TO 2 TABLETS BY MOUTH  DAILY WITH BREAKFAST   vitamin C  (ASCORBIC ACID ) 250 MG tablet Take 500 mg by mouth daily.   No facility-administered encounter medications on file as of 08/05/2023.    Allergies (verified) Naproxen, Bactrim  [sulfamethoxazole -trimethoprim ], Doxycycline , Aspirin, and Codeine   History: Past Medical History:  Diagnosis Date   Alcohol abuse    Emphysema of lung (HCC) 01/26/2009  pt. unsure of this reports as of 07/31/2012   Fracture of fifth metatarsal bone of right foot 08/29/2012   Gout 08/11/2012   Hyperlipemia 03/27/2007   Hypertension    Osteoporosis 07/12/2006   Pernicious anemia 10/25/2006   Polymyalgia rheumatica (HCC) 03/27/2007   sees Dr. Everlean in rheumatology   Vitamin D  deficiency 07/12/2006   Past Surgical History:  Procedure Laterality Date   APPENDECTOMY  07/12/2006   LUMBAR LAMINECTOMY/DECOMPRESSION MICRODISCECTOMY Right 02/13/2017    Procedure: Microdiscectomy - right - Lumbar three-Lumbar four;  Surgeon: Joshua Alm RAMAN, MD;  Location: [facility] OR;  Service: Neurosurgery;  Laterality: Right;   PARTIAL HIP ARTHROPLASTY     left side- 2x's ( 2nd surgery to correct 1st replacement), R side -    TOOTH EXTRACTION N/A 08/04/2012   Procedure: EXTRACTIONS 17, 20;  Surgeon: Glendia CHRISTELLA Primrose, DDS;  Location: [facility] OR;  Service: Oral Surgery;  Laterality: N/A;   Family History  Problem Relation Age of Onset   Mental illness Mother        alzheimer dementia   Ulcers Father    Heart disease Father    Cancer Father        lung, smoker   Social History   Socioeconomic History   Marital status: Married    Spouse name: Not on file   Number of children: 2   Years of education: Not on file   Highest education level: Not on file  Occupational History   Not on file  Tobacco Use   Smoking status: Former    Current packs/day: 0.00    Average packs/day: 2.0 packs/day for 58.5 years (116.9 ttl pk-yrs)    Types: Cigarettes    Start date: 01/23/1946    Quit date: 07/12/2006    Years since quitting: 17.0   Smokeless tobacco: Never  Vaping Use   Vaping status: Never Used  Substance and Sexual Activity   Alcohol use: No    Alcohol/week: 10.0 standard drinks of alcohol    Types: 10 Shots of liquor per week    Comment: quit completely - 08/18/10   Drug use: No   Sexual activity: Never  Other Topics Concern   Not on file  Social History Narrative   Work or School: none      Home Situation: lives with husband - drives, husband takes care of finances, dresses herself, takes care of housework and cooking      Spiritual Beliefs: [employer]      Lifestyle: uses cane when goes out, exercises sometimes - diet healthy      03/20/2018: Lives alone in one-level townhouse with dog. Husband passed away in fall of Aug 17, 2017. Coping well overall.   Has two daughters, both of whom live close by and are supportive.   Active in church   Enjoys gardening          Social Drivers of Health   Financial Resource Strain: Low Risk  (08/05/2023)   Overall Financial Resource Strain (CARDIA)    Difficulty of Paying Living Expenses: Not hard at all  Food Insecurity: No Food Insecurity (08/05/2023)   Hunger Vital Sign    Worried About Running Out of Food in the Last Year: Never true    Ran Out of Food in the Last Year: Never true  Transportation Needs: No Transportation Needs (08/05/2023)   PRAPARE - Administrator, Civil Service (Medical): No    Lack of Transportation (Non-Medical): No  Physical Activity: Inactive (08/05/2023)  Exercise Vital Sign    Days of Exercise per Week: 0 days    Minutes of Exercise per Session: 0 min  Stress: No Stress Concern Present (08/05/2023)   [employer] of Occupational Health - Occupational Stress Questionnaire    Feeling of Stress: Not at all  Social Connections: Moderately Integrated (08/05/2023)   Social Connection and Isolation Panel    Frequency of Communication with Friends and Family: More than three times a week    Frequency of Social Gatherings with Friends and Family: More than three times a week    Attends Religious Services: More than 4 times per year    Active Member of [employer] or Organizations: Yes    Attends Banker Meetings: More than 4 times per year    Marital Status: Widowed    Tobacco Counseling Counseling given: Not Answered    Clinical Intake:  Pre-visit preparation completed: Yes  Pain : No/denies pain     BMI - recorded: 18.16 Nutritional Status: BMI <19  Underweight Nutritional Risks: None Diabetes: No  Lab Results  Component Value Date   HGBA1C 5.8 07/21/2015   HGBA1C 5.7 09/08/2012   HGBA1C 5.7 (H) 07/03/2010     How often do you need to have someone help you when you read instructions, pamphlets, or other written materials from your doctor or pharmacy?: 1 - Never  Interpreter Needed?: No  Information entered by :: Rojelio Blush  LPN   Activities of Daily Living     08/05/2023    9:38 AM  In your present state of health, do you have any difficulty performing the following activities:  Hearing? 1  Comment Wears Hearing Aids  Vision? 0  Difficulty concentrating or making decisions? 0  Walking or climbing stairs? 1  Comment Uses a Cane  Dressing or bathing? 0  Doing errands, shopping? 0  Preparing Food and eating ? N  Using the Toilet? N  In the past six months, have you accidently leaked urine? N  Do you have problems with loss of bowel control? N  Managing your Medications? N  Managing your Finances? N  Housekeeping or managing your Housekeeping? N    Patient Care Team: [place], Betty G, MD as PCP - General (Family Medicine) Charmayne Molly, MD as Consulting Physician (Ophthalmology) Rosan Harlene Fickle, DO as Consulting Physician (Internal Medicine)  I have updated your Care Teams any recent Medical Services you may have received from other providers in the past year.     Assessment:   This is a routine wellness examination for [name].  Hearing/Vision screen Hearing Screening - Comments:: Wears Hearing Aids Vision Screening - Comments:: Wears rx glasses - up to date with routine eye exams with  [facility]   Goals Addressed               This Visit's Progress     Increase physical activity (pt-stated)         Depression Screen     08/05/2023    9:37 AM 08/01/2022    9:25 AM 10/24/2021    2:29 PM 08/11/2021    3:36 PM 07/28/2021   12:41 PM 05/30/2021    1:59 PM 08/26/2020    5:11 PM  PHQ 2/9 Scores  PHQ - 2 Score 0 0 0 0 0 0 0  PHQ- 9 Score       0    Fall Risk     08/05/2023    9:39 AM 08/01/2022    9:26  AM 07/17/2022   12:33 PM 10/24/2021    2:29 PM 08/11/2021    3:36 PM  Fall Risk   Falls in the past year? 0 0 0 0 0  Number falls in past yr: 0 0 0 0 0  Injury with Fall? 0 0 0 0 0  Risk for fall due to : No Fall Risks No Fall Risks Other (Comment) Other (Comment) Other  (Comment)  Follow up Falls evaluation completed Falls prevention discussed Falls evaluation completed Falls evaluation completed  Falls evaluation completed      Data saved with a previous flowsheet row definition    MEDICARE RISK AT HOME:  Medicare Risk at Home Any stairs in or around the home?: No If so, are there any without handrails?: No Home free of loose throw rugs in walkways, pet beds, electrical cords, etc?: Yes Adequate lighting in your home to reduce risk of falls?: Yes Life alert?: Yes Use of a cane, walker or w/c?: Yes Grab bars in the bathroom?: Yes Shower chair or bench in shower?: Yes Elevated toilet seat or a handicapped toilet?: Yes  TIMED UP AND GO:  Was the test performed?  No  Cognitive Function: 6CIT completed    08/30/2016   10:41 AM  MMSE - Mini Mental State Exam  Orientation to time 5   Orientation to Place 5   Registration 3   Attention/ Calculation 5   Recall 2   Language- name 2 objects 2   Language- repeat 1  Language- follow 3 step command 3   Language- read & follow direction 1   Write a sentence 1   Copy design 0   Total score 28      Data saved with a previous flowsheet row definition        08/05/2023    9:39 AM 08/01/2022    9:28 AM 07/28/2021   12:45 PM  6CIT Screen  What Year? 0 points 0 points 0 points  What month? 0 points 0 points 0 points  What time? 0 points 0 points 0 points  Count back from 20 0 points 0 points 2 points  Months in reverse 0 points 0 points 0 points  Repeat phrase 0 points 0 points 0 points  Total Score 0 points 0 points 2 points    Immunizations Immunization History  Administered Date(s) Administered   Fluad Quad(high Dose 65+) 09/26/2018, 10/26/2019, 10/28/2020   Fluad Trivalent(High Dose 65+) 01/28/2023   Influenza Split 10/24/2010, 10/04/2011   Influenza Whole 12/18/2006, 11/12/2007, 10/19/2008, 10/25/2009   Influenza, High Dose Seasonal PF 10/12/2014, 10/10/2015, 10/25/2016, 11/25/2017    Influenza,inj,Quad PF,6+ Mos 10/02/2012, 10/21/2013   PFIZER(Purple Top)SARS-COV-2 Vaccination 02/08/2019, 03/02/2019, 11/02/2019, 06/06/2020   PNEUMOCOCCAL CONJUGATE-20 10/28/2020   Pneumococcal Conjugate-13 07/20/2014   Pneumococcal Polysaccharide-23 11/23/1994, 09/08/2012   Td 06/22/1997, 10/15/2007   Tdap 07/27/2020   Unspecified SARS-COV-2 Vaccination 12/14/2020    Screening Tests Health Maintenance  Topic Date Due   Zoster Vaccines- Shingrix (1 of 2) Never done   COVID-19 Vaccine (6 - 2024-25 season) 09/23/2022   INFLUENZA VACCINE  08/23/2023   Medicare Annual Wellness (AWV)  08/04/2024   DTaP/Tdap/Td (4 - Td or Tdap) 07/28/2030   Pneumococcal Vaccine: 50+ Years  Completed   DEXA SCAN  Completed   Hepatitis B Vaccines  Aged Out   HPV VACCINES  Aged Out   Meningococcal B Vaccine  Aged Out    Health Maintenance  Health Maintenance Due  Topic Date Due   Zoster  Vaccines- Shingrix (1 of 2) Never done   COVID-19 Vaccine (6 - 2024-25 season) 09/23/2022   Health Maintenance Items Addressed:    Additional Screening:  Vision Screening: Recommended annual ophthalmology exams for early detection of glaucoma and other disorders of the eye. Would you like a referral to an eye doctor? No    Dental Screening: Recommended annual dental exams for proper oral hygiene  Community Resource Referral / Chronic Care Management: CRR required this visit?  No   CCM required this visit?  No   Plan:    I have personally reviewed and noted the following in the patient's chart:   Medical and social history Use of alcohol, tobacco or illicit drugs  Current medications and supplements including opioid prescriptions. Patient is not currently taking opioid prescriptions. Functional ability and status Nutritional status Physical activity Advanced directives List of other physicians Hospitalizations, surgeries, and ER visits in previous 12 months Vitals Screenings to include  cognitive, depression, and falls Referrals and appointments  In addition, I have reviewed and discussed with patient certain preventive protocols, quality metrics, and best practice recommendations. A written personalized care plan for preventive services as well as general preventive health recommendations were provided to patient.   Rojelio LELON Blush, LPN   [id number]   After Visit Summary: (MyChart) Due to this being a telephonic visit, the after visit summary with patients personalized plan was offered to patient via MyChart   Notes: Nothing significant to report at this time.

## 2023-09-10 DIAGNOSIS — M1991 Primary osteoarthritis, unspecified site: Secondary | ICD-10-CM | POA: Diagnosis not present

## 2023-09-10 DIAGNOSIS — Z8739 Personal history of other diseases of the musculoskeletal system and connective tissue: Secondary | ICD-10-CM | POA: Diagnosis not present

## 2023-09-10 DIAGNOSIS — G8929 Other chronic pain: Secondary | ICD-10-CM | POA: Diagnosis not present

## 2023-10-14 DIAGNOSIS — H353132 Nonexudative age-related macular degeneration, bilateral, intermediate dry stage: Secondary | ICD-10-CM | POA: Diagnosis not present

## 2023-10-14 DIAGNOSIS — Z961 Presence of intraocular lens: Secondary | ICD-10-CM | POA: Diagnosis not present

## 2023-10-14 DIAGNOSIS — H52203 Unspecified astigmatism, bilateral: Secondary | ICD-10-CM | POA: Diagnosis not present

## 2024-01-21 ENCOUNTER — Other Ambulatory Visit: Payer: Self-pay | Admitting: Family Medicine

## 2024-01-21 DIAGNOSIS — M353 Polymyalgia rheumatica: Secondary | ICD-10-CM

## 2024-08-10 ENCOUNTER — Ambulatory Visit
